# Patient Record
Sex: Female | Born: 1935 | Race: White | Hispanic: No | Smoking: Never smoker
Health system: Southern US, Community
[De-identification: ages and names within clinical notes are randomized; demographics above are authoritative.]

## PROBLEM LIST (undated history)

## (undated) DIAGNOSIS — I519 Heart disease, unspecified: Secondary | ICD-10-CM

## (undated) DIAGNOSIS — N3090 Cystitis, unspecified without hematuria: Secondary | ICD-10-CM

## (undated) DIAGNOSIS — M1990 Unspecified osteoarthritis, unspecified site: Secondary | ICD-10-CM

## (undated) DIAGNOSIS — I10 Essential (primary) hypertension: Secondary | ICD-10-CM

## (undated) DIAGNOSIS — E119 Type 2 diabetes mellitus without complications: Secondary | ICD-10-CM

## (undated) HISTORY — DX: Unspecified osteoarthritis, unspecified site: M19.90

## (undated) HISTORY — PX: APPENDECTOMY: SHX54

## (undated) HISTORY — DX: Essential (primary) hypertension: I10

## (undated) HISTORY — DX: Type 2 diabetes mellitus without complications: E11.9

## (undated) HISTORY — DX: Cystitis, unspecified without hematuria: N30.90

## (undated) HISTORY — DX: Heart disease, unspecified: I51.9

## (undated) HISTORY — PX: OTHER SURGICAL HISTORY: SHX169

---

## 1961-06-20 HISTORY — PX: TOTAL ABDOMINAL HYSTERECTOMY: SHX209

## 1973-06-20 HISTORY — PX: LUMBAR DISC SURGERY: SHX700

## 2003-06-22 LAB — HM COLONOSCOPY

## 2010-06-21 LAB — HM MAMMOGRAPHY: HM MAMMO: NORMAL

## 2010-10-14 ENCOUNTER — Ambulatory Visit: Payer: Self-pay | Admitting: Internal Medicine

## 2012-02-13 ENCOUNTER — Ambulatory Visit: Payer: Self-pay | Admitting: Internal Medicine

## 2012-04-17 ENCOUNTER — Ambulatory Visit: Payer: Self-pay | Admitting: Unknown Physician Specialty

## 2012-05-15 ENCOUNTER — Encounter: Payer: Self-pay | Admitting: Neurology

## 2012-05-20 ENCOUNTER — Encounter: Payer: Self-pay | Admitting: Neurology

## 2013-08-01 ENCOUNTER — Ambulatory Visit: Payer: Self-pay | Admitting: Podiatry

## 2013-08-01 LAB — CREATININE, SERUM
Creatinine: 1.24 mg/dL (ref 0.60–1.30)
EGFR (African American): 46 — ABNORMAL LOW
EGFR (Non-African Amer.): 39 — ABNORMAL LOW

## 2013-08-18 HISTORY — PX: TOE AMPUTATION: SHX809

## 2013-08-29 ENCOUNTER — Inpatient Hospital Stay: Payer: Self-pay | Admitting: Internal Medicine

## 2013-08-29 LAB — CBC WITH DIFFERENTIAL/PLATELET
Basophil #: 0.1 10*3/uL (ref 0.0–0.1)
Basophil %: 1 %
EOS ABS: 0.4 10*3/uL (ref 0.0–0.7)
Eosinophil %: 3.4 %
HCT: 35.1 % (ref 35.0–47.0)
HGB: 11.7 g/dL — ABNORMAL LOW (ref 12.0–16.0)
Lymphocyte #: 0.7 10*3/uL — ABNORMAL LOW (ref 1.0–3.6)
Lymphocyte %: 7 %
MCH: 27.1 pg (ref 26.0–34.0)
MCHC: 33.5 g/dL (ref 32.0–36.0)
MCV: 81 fL (ref 80–100)
MONO ABS: 0.7 x10 3/mm (ref 0.2–0.9)
MONOS PCT: 7 %
NEUTROS ABS: 8.6 10*3/uL — AB (ref 1.4–6.5)
NEUTROS PCT: 81.6 %
Platelet: 443 10*3/uL — ABNORMAL HIGH (ref 150–440)
RBC: 4.33 10*6/uL (ref 3.80–5.20)
RDW: 15.4 % — AB (ref 11.5–14.5)
WBC: 10.5 10*3/uL (ref 3.6–11.0)

## 2013-08-29 LAB — BASIC METABOLIC PANEL
Anion Gap: 5 — ABNORMAL LOW (ref 7–16)
BUN: 30 mg/dL — ABNORMAL HIGH (ref 7–18)
CO2: 27 mmol/L (ref 21–32)
Calcium, Total: 10 mg/dL (ref 8.5–10.1)
Chloride: 102 mmol/L (ref 98–107)
Creatinine: 1.28 mg/dL (ref 0.60–1.30)
EGFR (African American): 44 — ABNORMAL LOW
EGFR (Non-African Amer.): 38 — ABNORMAL LOW
GLUCOSE: 99 mg/dL (ref 65–99)
Osmolality: 274 (ref 275–301)
Potassium: 3.9 mmol/L (ref 3.5–5.1)
Sodium: 134 mmol/L — ABNORMAL LOW (ref 136–145)

## 2013-08-30 LAB — BASIC METABOLIC PANEL
ANION GAP: 3 — AB (ref 7–16)
BUN: 24 mg/dL — ABNORMAL HIGH (ref 7–18)
Calcium, Total: 9.4 mg/dL (ref 8.5–10.1)
Chloride: 102 mmol/L (ref 98–107)
Co2: 31 mmol/L (ref 21–32)
Creatinine: 1.13 mg/dL (ref 0.60–1.30)
EGFR (Non-African Amer.): 44 — ABNORMAL LOW
GFR CALC AF AMER: 51 — AB
Glucose: 118 mg/dL — ABNORMAL HIGH (ref 65–99)
OSMOLALITY: 277 (ref 275–301)
Potassium: 3.7 mmol/L (ref 3.5–5.1)
Sodium: 136 mmol/L (ref 136–145)

## 2013-08-30 LAB — CBC WITH DIFFERENTIAL/PLATELET
Basophil #: 0.1 10*3/uL (ref 0.0–0.1)
Basophil %: 0.9 %
Eosinophil #: 0.7 10*3/uL (ref 0.0–0.7)
Eosinophil %: 9.2 %
HCT: 30.3 % — AB (ref 35.0–47.0)
HGB: 10.2 g/dL — ABNORMAL LOW (ref 12.0–16.0)
LYMPHS ABS: 0.4 10*3/uL — AB (ref 1.0–3.6)
LYMPHS PCT: 5.7 %
MCH: 27.3 pg (ref 26.0–34.0)
MCHC: 33.6 g/dL (ref 32.0–36.0)
MCV: 81 fL (ref 80–100)
Monocyte #: 0.5 x10 3/mm (ref 0.2–0.9)
Monocyte %: 6.8 %
NEUTROS ABS: 5.5 10*3/uL (ref 1.4–6.5)
NEUTROS PCT: 77.4 %
Platelet: 338 10*3/uL (ref 150–440)
RBC: 3.73 10*6/uL — AB (ref 3.80–5.20)
RDW: 15.4 % — ABNORMAL HIGH (ref 11.5–14.5)
WBC: 7.2 10*3/uL (ref 3.6–11.0)

## 2013-08-31 LAB — SEDIMENTATION RATE: ERYTHROCYTE SED RATE: 61 mm/h — AB (ref 0–30)

## 2013-09-01 LAB — CREATININE, SERUM
CREATININE: 0.97 mg/dL (ref 0.60–1.30)
EGFR (African American): >60
GFR CALC NON AF AMER: 53 — AB

## 2013-09-02 LAB — VANCOMYCIN, TROUGH: VANCOMYCIN, TROUGH: 9 ug/mL — AB (ref 10–20)

## 2013-09-03 LAB — CULTURE, BLOOD (SINGLE)

## 2013-09-04 LAB — WOUND CULTURE

## 2013-09-04 LAB — PATHOLOGY REPORT

## 2013-11-08 DIAGNOSIS — E782 Mixed hyperlipidemia: Secondary | ICD-10-CM | POA: Insufficient documentation

## 2014-07-17 DIAGNOSIS — I071 Rheumatic tricuspid insufficiency: Secondary | ICD-10-CM | POA: Insufficient documentation

## 2014-07-17 DIAGNOSIS — I119 Hypertensive heart disease without heart failure: Secondary | ICD-10-CM | POA: Insufficient documentation

## 2014-07-17 DIAGNOSIS — I340 Nonrheumatic mitral (valve) insufficiency: Secondary | ICD-10-CM | POA: Insufficient documentation

## 2014-07-28 LAB — BASIC METABOLIC PANEL
BUN: 17 mg/dL (ref 4–21)
CREATININE: 1 mg/dL (ref <=1.1)

## 2014-07-28 LAB — HEMOGLOBIN A1C: Hgb A1c MFr Bld: 5.6 % (ref 4.0–6.0)

## 2014-07-28 LAB — LIPID PANEL
CHOLESTEROL: 241 mg/dL — AB (ref 0–200)
HDL: 66 mg/dL (ref 35–70)
LDL Cholesterol: 158 mg/dL
Triglycerides: 85 mg/dL (ref 40–160)

## 2014-07-28 LAB — TSH: TSH: 2.1 u[IU]/mL (ref <=5.90)

## 2014-07-28 LAB — CBC AND DIFFERENTIAL: Hemoglobin: 15.9 g/dL (ref 12.0–16.0)

## 2014-10-11 NOTE — Consult Note (Signed)
PATIENT NAME:  Laura [name], Laura [name] MR#:  [id number] DATE OF BIRTH:  January 17, 1925  DATE OF CONSULTATION:  08/29/2013  CONSULTING PHYSICIAN:  Larkin Ina A. Vickki Muff, DPM  REASON FOR CONSULTATION:  Left great toe osteomyelitis.   HISTORY OF PRESENT ILLNESS: This is an 81 year old female who I have been following in the outpatient clinic for the past approximately 3 to 4 weeks with an ulcer on her left great toe. She developed a deep probing ulcer. An MRI was performed, which initially was negative for osteomyelitis, and we have been monitoring her with x-rays. She presented to the outpatient clinic this week with noted worsening redness, swelling, drainage from her left great toe. X-rays at that time revealed obvious erosive changes to the IP joint of the left great toe, and we have recommended admission for IV antibiotics, surgical debridement and continue to follow  monitoring at that point.   PAST MEDICAL HISTORY:  Diabetes, hypertension, spinal stenosis, left valvular heart disease.   MEDICATIONS: Norvasc, vitamin D, clobetasol, clonidine, hydrochlorothiazide, losartan, multivitamin, pantoprazole.   ALLERGIES: CIPRO AND PPD.   SOCIAL HISTORY: She lives at home by herself. Her son brought her in today. She denies smoking or alcohol.   REVIEW OF SYSTEMS: She is not having any fevers or chills. She has had some mild to minimal pain to this left foot. No shortness of breath or chest pain. She has noticed some swelling into her left leg. Further review of systems are as above.    PHYSICAL EXAMINATION: GENERAL: She is alert and oriented.  VASCULAR: She has strongly palpable dorsalis pedis and posterior tibial pulses to her left foot. Capillary fill time is brisk.  NEUROLOGIC: Gross sensation is intact to the left foot, but protective sensation is absent to this left foot.  DERMATOLOGIC: She has noted diffuse cellulitis from the left great toe diffusely to the midfoot region. There is no lymphangitic streaking. She  has a plantar left great toe ulceration that probes dorsally to a secondary ulceration that was an area that was noted to have an abscess to it. This also probes down to bone at this point.  MUSCULOSKELETAL: Diffuse edema to the left leg and foot. There is crepitance at the IPJ of the left great toe with instability.   X-rays from the outpatient clinic shows obvious erosive changes at the IPJ of the left great toe with dorsomedial dislocation of the distal phalanx to the IPJ at this time, consistent with osteomyelitis. The MTPJ looks to be intact at this point.   ASSESSMENT: Left great toe osteomyelitis.   PLAN: We have admitted her. They have started her on IV Zosyn for now. A wound culture was taken outpatient. We will try to further evaluate this, as we get information on it. We do need to go ahead and consider surgical intervention with amputation of the left great toe. I have discussed this with the patient in great detail and consent has been given. We will plan on performing this tomorrow.   I will consult infectious disease to assist with IV antibiotics. She will likely need a PICC line.   ____________________________ Pete Glatter. Vickki Muff, DPM jaf:dmm D: 08/29/2013 13:04:09 ET T: 08/29/2013 13:25:33 ET JOB#: [id number]  cc: Larkin Ina A. Vickki Muff, DPM, <Dictator> Dushaun Okey DPM ELECTRONICALLY SIGNED 08/30/2013 10:09

## 2014-10-11 NOTE — Op Note (Signed)
PATIENT NAME:  Laura [name], Laura [name] MR#:  [id number] DATE OF BIRTH:  [birth year]  DATE OF PROCEDURE:  08/30/2013  PREOPERATIVE DIAGNOSIS: Left great toe osteomyelitis.   POSTOPERATIVE DIAGNOSIS: Left great toe osteomyelitis.   PROCEDURE: Amputation left great toe metatarsophalangeal joint.   SURGEON: Tarence Searcy A. Vickki Muff, DPM.   ANESTHESIA: IV sedation with local.   HEMOSTASIS: None.   COMPLICATIONS: None.   SPECIMEN: Left great toe osteomyelitis and wound culture from deep wound.   ESTIMATED BLOOD LOSS: Less than 25 mL.  OPERATIVE INDICATIONS: This is an 81 year old female, who was recently admitted for osteomyelitis of her left great toe. She presents to the OR today for surgical amputation of the left great toe. All risks, benefits, alternatives, and complications associated with surgery were discussed with the patient and informed consent has been given.   OPERATIVE PROCEDURE: The patient was brought into the OR and placed on operating table in the supine position. IV sedation was administered by the anesthesia team. A local block was placed by myself with Marcaine and lidocaine. The left lower extremity was then prepped and draped in the usual sterile fashion. Attention was directed to the left great toe where a fishmouth type of incision was made at the base of the toe. Full thickness incision was made dorsal and plantar. The toe was then disarticulated at the MTPJ. A deep wound culture was taken from the deeper portion of the ulcerative site where the purulent drainage was noted. The remainder of the toe was sent for pathological examination. All bleeders were Bovie cauterized. The wound was flushed with copious amounts of irrigation. Layered closure was then performed with a 4-0 Vicryl for the deeper layer and a 3-0 nylon for skin. A well compressive sterile bulky dressing was placed on the left foot. Overall, the patient tolerated the procedure and anesthesia well and was transported from the OR to  the PACU with all vital signs stable and neurovascular status intact. I will see her in the outpatient clinic in 5 to 7 days. Upon discharge, we will keep her in house for IV antibiotics and possible long-term PICC line.   ____________________________ Pete Glatter. Vickki Muff, DPM jaf:aw D: 08/30/2013 13:04:21 ET T: 08/30/2013 13:14:44 ET JOB#: [id number]  cc: Larkin Ina A. Vickki Muff, DPM, <Dictator> Tenia Goh DPM ELECTRONICALLY SIGNED 09/26/2013 9:11

## 2014-10-11 NOTE — H&P (Signed)
PATIENT NAME:  Laura [name], Laura [name] MR#:  [id number] DATE OF BIRTH:  Nov 26, 1924  DATE OF ADMISSION:  08/29/2013  PRIMARY CARE PHYSICIAN: Dr. Halina Maidens  PRIMARY PODIATRIST: Dr. Samara Deist  CHIEF COMPLAINT: Abscess of the left great toe.   HISTORY OF PRESENT ILLNESS: This is an 81 year old female who has been seeing Dr. Vickki Muff for the past several weeks due to an abscess on her left great toe. She does not know when this started, but she noted at one point that she had a blister and it popped. About a week later she was able to see Dr. Vickki Muff. She has been on some antibiotics for this and they have been treating this as an outpatient. However, apparently this has worsened and Dr. Vickki Muff is concerned and so asked the hospitalist to admit the patient for possible debridement/possible amputation and to rule out osteomyelitis.   REVIEW OF SYSTEMS: CONSTITUTIONAL: No fevers, chills, weakness.  EYES: No cataracts or blurry vision.  ENT: No tinnitus, ear pain, difficulty swallowing. RESPIRATORY: No cough, wheezing, hemoptysis, COPD. CARDIOVASCULAR: No chest pain, orthopnea, PND, or dyspnea on exertion. GASTROINTESTINAL: No nausea, vomiting, diarrhea, abdominal pain, melena, or ulcers. GENITOURINARY: No dysuria or hematuria.  ENDOCRINE: No polyuria or polydipsia.  HEMATOLOGIC AND LYMPHATIC: No easy bruising, bleeding, or swollen glands.  SKIN: She has this cellulitis and abscess of her great toe. MUSCULOSKELETAL: She has spinal stenosis. NEUROLOGIC: No history of CVA, TIA. PSYCHIATRIC: No history of anxiety or depression.  PAST MEDICAL HISTORY:  1.  Diabetes. 2.  Hypertension.  3.  Spinal stenosis.  PAST SURGICAL HISTORY:  1.  Lumbar surgery. 2.  Disk surgery. 3.  Right mandibular gland surgery. 4.  Colon polyps removed. 5.  Right ovary and fallopian tube removed. 6.  Appendectomy.   FAMILY HISTORY: Positive for CVA.  SOCIAL HISTORY: No tobacco, alcohol, or drug use.   ALLERGIES:  CIPROFLOXACIN, PPD.  MEDICATIONS: 1.  Norvasc 5 mg daily.  2.  Vitamin D3 400 units daily.  3.  Clobetasol 0.05% b.i.d. p.r.n.  4.  Clonidine 0.2 b.i.d.  5.  HCTZ/triamterene 25/37.5 daily.  6.  Losartan 100 mg daily.  7.  Multivitamin 1 tablet daily.  8.  Pantoprazole 40 mg daily.  PHYSICAL EXAMINATION: VITAL SIGNS: Temperature 97.4, pulse 80, respirations 18, blood pressure 132/80, 97% on room air.  GENERAL: The patient is alert and oriented, not in acute distress.  HEENT: Head is atraumatic. Pupils are round. Sclerae anicteric. Mucous membranes are moist. Oropharynx is clear.  NECK: Supple without JVD, carotid bruit, enlarged thyroid. HEART: Regular rate and rhythm. No murmurs, gallops, or rubs. PMI is not displaced. LUNGS: Clear to auscultation without crackles, rales, rhonchi, or wheezing. Normal to percussion.  ABDOMEN: Bowel sounds positive. Nontender and nondistended. No hepatosplenomegaly.   EXTREMITIES: She has 3+ edema in the left lower extremity. No edema in the right leg. SKIN: She has on her right foot, her middle and last tone, has a small, little skin tear/redness.  LEFT FOOT: Her first big toe is covered, but she has cellulitis all around that left toe with surrounding edema.  NEUROLOGIC: Cranial nerves II through XII are grossly intact. No focal deficits.   LABORATORY DATA: Pending.  ASSESSMENT AND PLAN: An 81 year old female who noticed a blister about 8 weeks ago and since that time has progressed into an abscess of her left foot big toe. She is being admitted for treatment as well as possible surgical debridement. 1.  Cellulitis of the toe with an  abscess.  Dr. Vickki Muff will be consulted as the patient was sent from his office. I will order a MRI to evaluate for osteomyelitis. The patient is on vancomycin and Zosyn. Further recommendations as per Dr. Vickki Muff.  2.  Lower extremity edema of the left leg, likely secondary to the infection of her toe. However, we will need  to rule out a deep vein thrombosis as the patient has not been as mobile. Dopplers are ordered. 3.  Hypertension. Continue outpatient medications. 4.  Diabetes. The patient will be on sliding scale insulin, holding metformin.  The patient is FULL code status.   TIME SPENT: Approximately 50 minutes.    ____________________________ Donell Beers. Benjie Karvonen, MD spm:sb D: 08/29/2013 11:34:08 ET T: 08/29/2013 12:17:03 ET JOB#: [id number]  cc: Brielle Moro P. Benjie Karvonen, MD, <Dictator> Halina Maidens, MD Pete Glatter Vickki Muff, DPM Darnesha Diloreto P Adilen Pavelko MD ELECTRONICALLY SIGNED 08/29/2013 14:37

## 2014-10-11 NOTE — Op Note (Signed)
PATIENT NAME:  Laura [name], Laura [name] MR#:  [id number] DATE OF BIRTH:  [birth year]  DATE OF PROCEDURE:  08/29/2013  PREOPERATIVE DIAGNOSIS: Osteomyelitis of the foot.   POSTOPERATIVE DIAGNOSIS: Osteomyelitis of the foot.   PROCEDURES:  1. Ultrasound guidance for vascular access to right basilic vein.  2. Fluoroscopic guidance for placement of catheter.  3. Insertion of peripherally inserted central venous catheter, right arm.  SURGEON: Algernon Huxley, M.D.   ANESTHESIA: Local.   ESTIMATED BLOOD LOSS: Minimal.   INDICATION FOR PROCEDURE: An 81 year old female with osteomyelitis of her foot who will require extended IV antibiotics.   DESCRIPTION OF PROCEDURE: The patient's right arm was sterilely prepped and draped, and a sterile surgical field was created. The right basilic vein was accessed under direct ultrasound guidance without difficulty with a micropuncture needle and permanent image was recorded. 0.018 wire was then placed into the superior vena cava. Peel-away sheath was placed over the wire. A single lumen peripherally inserted central venous catheter was then placed over the wire and the wire and peel-away sheath were removed. The catheter tip was placed into the superior vena cava and was secured at the skin at 31 cm with a sterile dressing. The catheter withdrew blood well and flushed easily with heparinized saline. The patient tolerated procedure well.  ____________________________ Algernon Huxley, MD jsd:gb D: 09/02/2013 14:55:00 ET T: 09/03/2013 00:20:35 ET JOB#: [id number]  cc: Algernon Huxley, MD, <Dictator> Algernon Huxley MD ELECTRONICALLY SIGNED 09/23/2013 9:43

## 2014-10-11 NOTE — Consult Note (Signed)
PATIENT NAME:  Laura [name], UBER MR#:  [id number] DATE OF BIRTH:  1924-12-26  DATE OF CONSULTATION:  08/30/2013  REFERRING PHYSICIAN:  Dr. Vickki Muff.  CONSULTING PHYSICIAN:  Cheral Marker. Ola Spurr, MD  REASON FOR CONSULTATION: Osteomyelitis and cellulitis.   HISTORY OF PRESENT ILLNESS: This is a very pleasant 81 year old female with reasonably well controlled diabetes, who has been following with podiatry as an outpatient for left great toe abscess and ulcer. She has been started on antibiotics, which she thinks was Augmentin. However, the wound popped and started draining and has worsened and started to have some spreading redness up her dorsum of her foot and onto her calf. The patient was admitted for IV antibiotics and possible debridement and to rule out osteomyelitis. She is scheduled for the OR today for partial amputation.   PAST MEDICAL HISTORY: 1.  Diabetes.  2.  Hypertension.  3.  Spinal stenosis.   PAST SURGICAL HISTORY:  1.  Lumbar surgery, disk surgery, right mandibular gland surgery.  2.  Colon polyp surgery.  3.  Right ovarian and fallopian tube surgery.  4.  Appendectomy.   FAMILY HISTORY: Positive for CVA.   SOCIAL HISTORY: The patient does not smoke, drink or use drugs. She lives at home.   ALLERGIES:  CIPROFLOXACIN.   ANTIBIOTICS SINCE ADMISSION: Include vancomycin and Zosyn.    OTHER MEDICATIONS: Include Tylenol, amlodipine, vitamin D, clonidine, clobetasol cream, Colace, Lovenox, Maxzide, insulin, Zofran, losartan, pantoprazole, Norco, morphine.   REVIEW OF SYSTEMS:  Eleven systems reviewed and negative except as per HPI.   PHYSICAL EXAMINATION: VITAL SIGNS: T-max since admission 98.4, pulse 93, blood pressure 136/79, respirations 18, sat 93% on room air.  GENERAL: She is pleasant, interactive, in no acute distress.  HEENT: Pupils equal, round and reactive light and accommodation. Extraocular movements are intact. Sclerae anicteric.  Oropharynx is clear. HEART: Regular.   LUNGS: Clear to auscultation bilaterally.  ABDOMEN: Soft, nontender, nondistended.  EXTREMITIES: On her left great toe, she has a plantar ulcer. I am able to express a small amount of pus from it. It is quite tender. She has spreading redness and warmth up her dorsum of her foot and onto her calf. She does have 1+ edema.   DATA: White blood count on admission was 10.5, hemoglobin 11.7, platelets 443. Blood cultures x 2 from March 12th show no growth to date. Renal function shows a creatinine of 1.28 with an estimated GFR of 38. Lower extremity Doppler was negative for DVT.     IMPRESSION: An 81 year old with osteomyelitis of the first toe. She has a draining ulcer at the site. Her diabetes is reportedly well controlled. She does have good circulation at the site. She is for surgery today.   RECOMMENDATIONS: 1.  PICC line is in place.  2.  Continue vancomycin and Zosyn.  3.  Further antibiotic recs based on culture results. She will likely need IV antibiotics for 2 to 4 weeks. Following that, I will see her in clinic, then can consider switching her to oral antibiotics to complete the course.   Thank you for the consult. I will be glad to follow with you.   ____________________________ Cheral Marker. Ola Spurr, MD dpf:dmm D: 08/30/2013 19:51:08 ET T: 08/30/2013 21:54:07 ET JOB#: [id number]  cc: Cheral Marker. Ola Spurr, MD, <Dictator> DAVID Ola Spurr MD ELECTRONICALLY SIGNED 09/01/2013 21:58

## 2014-10-11 NOTE — Discharge Summary (Signed)
PATIENT NAME:  Laura [name], Laura [name] MR#:  [id number] DATE OF BIRTH:  [birth year]  DATE OF ADMISSION:  08/29/2013 DATE OF DISCHARGE:  09/02/2013  ADMITTING DIAGNOSIS: Abscess of the left great toe.  DISCHARGE DIAGNOSES:  1.  Left great toe abscess/osteomyelitis status post amputation of left great toe metatarsophalangeal joint.  2.  Diabetes.  3.  Hypertension.  4.  Spinal stenosis.  5.  Status post lumbar surgery.  6.  Status post disk surgery.  7.  Status post right mandibular gland surgery.  8.  Status post colon polyp removal.  9.  Status post right ovary and fallopian tube removal.  10.  Status post appendectomy.   CONSULTANTS: Dr. Vickki Muff and Dr. Ola Spurr.  PERTINENT LABS AND EVALUATIONS: Glucose 99, BUN 30, creatinine 1.28, sodium 134, potassium 3.9, chloride 102, CO2 27. Calcium 10. WBC 10.5, hemoglobin 11.7, platelet count 443,000.    Blood culture no growth. Wound cultures of the left leg shows no growth. Moderate white blood cells, few gram-positive cocci in pairs and clusters.   Ultrasound of the lower extremities showed no evidence of DVT.   HOSPITAL COURSE: Please refer to H and P done by the admitting physician. The patient is an 81 year old white female who has been seeing Dr. Vickki Muff for the past few weeks for abscess of the left great toe. The patient was referred for osteomyelitis, and the patient was admitted for debridement and possible amputation due to the patient's failure to improve. Due to these symptoms, she was admitted and was started on antibiotics. She was seen by Dr. Vickki Muff who performed the amputation and wound cultures were ordered. She was seen by infectious disease due to the severity of infection. The patient was arranged to have IV antibiotics with the [facility]. The patient at this time is doing much better and has been cleared by Dr. Vickki Muff as well as Dr. Ola Spurr for discharge with home IV antibiotics.   DISCHARGE MEDICATIONS: Metformin 500 daily, losartan  100 daily, clonidine 0.2 one 1 tab p.o. b.i.d., aspirin 81 one tab p.o. daily, Allegra 180 daily, Tylenol 1000 mg q. 6 p.r.n., clobetasol topical 0.05% apply topically to affected area b.i.d., amlodipine 5 daily, Centrum 1 tab p.o. daily, methocarbamol 500 mg 1 tab 4 times a day as needed, vitamin D3 1000 international units daily, (Dictation Anomaly)   topically 2% to affected area 3 times a day as needed for itching, Maxzide 25/37.5 one 1 tab p.o. daily, acetaminophen/hydrocodone 325/5 one tab p.o. q. 6 p.r.n. for pain, vancomycin 1 gram IV q. 24 for 2 weeks, amoxicillin clavulanate 875 mg 1 tab p.o. b.i.d. x2 weeks.   HOME HEALTH: Yes. Physical therapy and nurse referral with dry dressing to the left foot every 2 days.   DIET: Low sodium, low fat, low cholesterol, carbohydrate control.   ACTIVITY: As tolerated with left leg partial weight-bearing, use rolling walker.  DISCHARGE FOLLOWUP AND INSTRUCTIONS: With Dr. Ola Spurr this week. Follow up with Dr. Vickki Muff as scheduled. Follow with primary MD in 2 to 4 weeks. Weekly CBC, CMP and vanc, to Dr. Ola Spurr.  TIME SPENT ON DISCHARGE: 45 minutes.  ____________________________ Lafonda Mosses Posey Pronto, MD shp:sb D: 09/03/2013 09:58:03 ET T: 09/03/2013 12:16:01 ET JOB#: [id number]  cc: Liviana Mills H. Posey Pronto, MD, <Dictator> Alric Seton MD ELECTRONICALLY SIGNED 09/04/2013 8:27

## 2015-01-16 ENCOUNTER — Encounter: Payer: Self-pay | Admitting: Internal Medicine

## 2015-01-16 ENCOUNTER — Ambulatory Visit (INDEPENDENT_AMBULATORY_CARE_PROVIDER_SITE_OTHER): Payer: Medicare PPO | Admitting: Internal Medicine

## 2015-01-16 VITALS — BP 126/86 | HR 76 | Ht 63.5 in | Wt 159.0 lb

## 2015-01-16 DIAGNOSIS — R600 Localized edema: Secondary | ICD-10-CM | POA: Insufficient documentation

## 2015-01-16 DIAGNOSIS — R300 Dysuria: Secondary | ICD-10-CM

## 2015-01-16 DIAGNOSIS — S98112A Complete traumatic amputation of left great toe, initial encounter: Secondary | ICD-10-CM | POA: Insufficient documentation

## 2015-01-16 DIAGNOSIS — M542 Cervicalgia: Secondary | ICD-10-CM | POA: Insufficient documentation

## 2015-01-16 DIAGNOSIS — E785 Hyperlipidemia, unspecified: Secondary | ICD-10-CM | POA: Insufficient documentation

## 2015-01-16 DIAGNOSIS — G5600 Carpal tunnel syndrome, unspecified upper limb: Secondary | ICD-10-CM | POA: Insufficient documentation

## 2015-01-16 DIAGNOSIS — E1151 Type 2 diabetes mellitus with diabetic peripheral angiopathy without gangrene: Secondary | ICD-10-CM | POA: Insufficient documentation

## 2015-01-16 DIAGNOSIS — D472 Monoclonal gammopathy: Secondary | ICD-10-CM | POA: Insufficient documentation

## 2015-01-16 DIAGNOSIS — L309 Dermatitis, unspecified: Secondary | ICD-10-CM | POA: Insufficient documentation

## 2015-01-16 DIAGNOSIS — T7840XA Allergy, unspecified, initial encounter: Secondary | ICD-10-CM | POA: Insufficient documentation

## 2015-01-16 DIAGNOSIS — N3946 Mixed incontinence: Secondary | ICD-10-CM | POA: Insufficient documentation

## 2015-01-16 DIAGNOSIS — M5416 Radiculopathy, lumbar region: Secondary | ICD-10-CM | POA: Insufficient documentation

## 2015-01-16 DIAGNOSIS — L988 Other specified disorders of the skin and subcutaneous tissue: Secondary | ICD-10-CM | POA: Insufficient documentation

## 2015-01-16 DIAGNOSIS — I10 Essential (primary) hypertension: Secondary | ICD-10-CM | POA: Insufficient documentation

## 2015-01-16 LAB — POC URINALYSIS WITH MICROSCOPIC (NON AUTO)MANUAL RESULT
CRYSTALS: 2
Epithelial cells, urine per micros: 2
MUCUS UA: 0
RBC: 1 M/uL — AB (ref 4.04–5.48)

## 2015-01-16 MED ORDER — NITROFURANTOIN MONOHYD MACRO 100 MG PO CAPS: 100.0000 mg | ORAL_CAPSULE | Freq: Two times a day (BID) | ORAL | Status: DC

## 2015-01-16 NOTE — Progress Notes (Signed)
Date:  01/16/2015   Name:  Laura [name]   DOB:  06-03-1925   MRN:  [id number]   Chief Complaint: Urinary Tract Infection Urinary Tract Infection  This is a new problem. The current episode started in the past 7 days. The problem occurs every urination. The problem has been unchanged. The quality of the pain is described as burning. The patient is experiencing no pain. There has been no fever. She is not sexually active. There is no history of pyelonephritis. Associated symptoms include urgency. Pertinent negatives include no chills, discharge, flank pain, frequency, hematuria or vomiting. The treatment provided no relief. There is no history of catheterization, recurrent UTIs or a single kidney.     Review of Systems:  Review of Systems  Constitutional: Negative for chills.  Respiratory: Negative for chest tightness and shortness of breath.   Gastrointestinal: Negative for vomiting.  Genitourinary: Positive for urgency. Negative for dysuria, frequency, hematuria, flank pain and vaginal bleeding.       Urine odor     Patient Active Problem List   Diagnosis Date Noted  . Carpal tunnel syndrome 01/16/2015  . Cervical pain 01/16/2015  . DM (diabetes mellitus), type 2 with peripheral vascular complications 46/80/3212  . Dyslipidemia 01/16/2015  . Allergic state 01/16/2015  . Essential (primary) hypertension 01/16/2015  . Amputated great toe 01/16/2015  . Calcium blood increased 01/16/2015  . Local edema 01/16/2015  . Lumbar radiculopathy 01/16/2015  . Mixed incontinence 01/16/2015  . MGUS (monoclonal gammopathy of unknown significance) 01/16/2015  . Peripheral blood vessel disorder 01/16/2015  . Dermatitis 01/16/2015  . Hypertensive left ventricular hypertrophy 07/17/2014  . MI (mitral incompetence) 07/17/2014  . TI (tricuspid incompetence) 07/17/2014  . Combined fat and carbohydrate induced hyperlipemia 11/08/2013    Prior to Admission medications   Medication Sig Start Date End  Date Taking? Authorizing Provider  amLODipine (NORVASC) 2.5 MG tablet Take 1 tablet by mouth daily.   Yes Historical Provider, MD  aspirin 81 MG chewable tablet Chew 1 tablet by mouth daily.   Yes Historical Provider, MD  clobetasol ointment (TEMOVATE) 0.05 % CLOBETASOL PROPIONATE, 0.05% (External Ointment) - Historical Medication  appication two times daily (0.05 %) Active   Yes Historical Provider, MD  cloNIDine (CATAPRES) 0.2 MG tablet Take 1 tablet by mouth 2 (two) times daily. 07/28/14  Yes Historical Provider, MD  fexofenadine (ALLEGRA) 180 MG tablet Take 1 tablet by mouth daily as needed. 12/11/12  Yes Historical Provider, MD  glucose blood (ACCU-CHEK AVIVA PLUS) test strip ACCU-CHEK AVIVA PLUS (In Vitro Strip)  1 (one) Strip daily for 50 days  Quantity: 50;  Refills: 3   Ordered :12-May-2014  Halina Maidens M.D.;  Started 12-May-2014 Active Comments: dx: E11.9 05/12/14  Yes Historical Provider, MD  losartan (COZAAR) 100 MG tablet Take 1 tablet by mouth daily. 05/06/14  Yes Historical Provider, MD  metFORMIN (GLUCOPHAGE) 500 MG tablet Take 1 tablet by mouth daily. 12/06/14  Yes Historical Provider, MD  mometasone (NASONEX) 50 MCG/ACT nasal spray Place 2 sprays into the nose daily as needed. 05/12/14  Yes Historical Provider, MD  triamterene-hydrochlorothiazide (MAXZIDE-25) 37.5-25 MG per tablet Take 1 tablet by mouth daily. 07/28/14  Yes Historical Provider, MD  mupirocin ointment (BACTROBAN) 2 %  12/11/12   Historical Provider, MD    Allergies  Allergen Reactions  . Calcium Channel Blockers Shortness Of Breath  . Ace Inhibitors Cough  . Beta Adrenergic Blockers     Other reaction(s): Headache  . Statins  weakness  . Clindamycin/Lincomycin Rash    Past Surgical History  Procedure Laterality Date  . Total abdominal hysterectomy  1963  . Appendectomy    . Lumbar disc surgery  1975  . Toe amputation Left 08/2013    History  Substance Use Topics  . Smoking status: Never  Smoker   . Smokeless tobacco: Not on file  . Alcohol Use: No     Medication list has been reviewed and updated.  Physical Examination:  Physical Exam  Constitutional: She appears well-developed and well-nourished. No distress.  Neck: Neck supple. No thyromegaly present.  Cardiovascular: Normal rate, regular rhythm and normal heart sounds.   Pulmonary/Chest: Effort normal and breath sounds normal. She has no wheezes.  Abdominal: Soft. There is no tenderness. There is no guarding and no CVA tenderness.    BP 142/88 mmHg  Pulse 76  Ht 5' 3.5" (1.613 m)  Wt 159 lb (72.122 kg)  BMI 27.72 kg/m2  Assessment and Plan: 1. Dysuria Continue adequate fluids - POC urinalysis w microscopic (non auto) - nitrofurantoin, macrocrystal-monohydrate, (MACROBID) 100 MG capsule; Take 1 capsule (100 mg total) by mouth 2 (two) times daily.  Dispense: 14 capsule; Refill: [place], MD [facility]  01/16/2015

## 2015-05-11 ENCOUNTER — Other Ambulatory Visit: Payer: Self-pay | Admitting: Internal Medicine

## 2015-06-08 ENCOUNTER — Other Ambulatory Visit: Payer: Self-pay | Admitting: Internal Medicine

## 2015-07-02 ENCOUNTER — Telehealth: Payer: Self-pay

## 2015-07-02 NOTE — Telephone Encounter (Signed)
Patient called in and states that she would like to get a Zpak for her cold and mucus she has going on. She states that she did not want to come.  I informed her that Dr. Army Melia does not Rx antibiotics over the phone and that she would need to see her for an office visit. She states that she does not feel well enough to come in. Advised that she would call back if she felt like coming in.

## 2015-08-06 ENCOUNTER — Encounter: Payer: Self-pay | Admitting: Internal Medicine

## 2015-08-06 ENCOUNTER — Ambulatory Visit (INDEPENDENT_AMBULATORY_CARE_PROVIDER_SITE_OTHER): Payer: Medicare PPO | Admitting: Internal Medicine

## 2015-08-06 VITALS — BP 146/88 | HR 78 | Ht 63.5 in | Wt 162.0 lb

## 2015-08-06 DIAGNOSIS — Z89412 Acquired absence of left great toe: Secondary | ICD-10-CM

## 2015-08-06 DIAGNOSIS — E782 Mixed hyperlipidemia: Secondary | ICD-10-CM | POA: Diagnosis not present

## 2015-08-06 DIAGNOSIS — S98112A Complete traumatic amputation of left great toe, initial encounter: Secondary | ICD-10-CM

## 2015-08-06 DIAGNOSIS — Z1231 Encounter for screening mammogram for malignant neoplasm of breast: Secondary | ICD-10-CM | POA: Diagnosis not present

## 2015-08-06 DIAGNOSIS — I119 Hypertensive heart disease without heart failure: Secondary | ICD-10-CM

## 2015-08-06 DIAGNOSIS — I10 Essential (primary) hypertension: Secondary | ICD-10-CM

## 2015-08-06 DIAGNOSIS — N3000 Acute cystitis without hematuria: Secondary | ICD-10-CM

## 2015-08-06 DIAGNOSIS — E1151 Type 2 diabetes mellitus with diabetic peripheral angiopathy without gangrene: Secondary | ICD-10-CM

## 2015-08-06 LAB — POC URINALYSIS WITH MICROSCOPIC (NON AUTO)MANUAL RESULT
Bilirubin, UA: NEGATIVE
CRYSTALS: 0
Epithelial cells, urine per micros: 3
Glucose, UA: NEGATIVE
Ketones, UA: NEGATIVE
MUCUS UA: 0
Nitrite, UA: POSITIVE
PH UA: 7.5
PROTEIN UA: 30
RBC: 0 M/uL — AB (ref 4.04–5.48)
SPEC GRAV UA: 1.01
Urobilinogen, UA: 0.2

## 2015-08-06 MED ORDER — TRIAMTERENE-HCTZ 37.5-25 MG PO TABS: 1.0000 | ORAL_TABLET | Freq: Every day | ORAL | Status: DC

## 2015-08-06 MED ORDER — METFORMIN HCL 500 MG PO TABS: 500.0000 mg | ORAL_TABLET | Freq: Every day | ORAL | Status: DC

## 2015-08-06 MED ORDER — CLONIDINE HCL 0.2 MG PO TABS: 0.2000 mg | ORAL_TABLET | Freq: Two times a day (BID) | ORAL | Status: DC

## 2015-08-06 MED ORDER — CIPROFLOXACIN HCL 250 MG PO TABS: 250.0000 mg | ORAL_TABLET | Freq: Two times a day (BID) | ORAL | Status: DC

## 2015-08-06 MED ORDER — GLUCOSE BLOOD VI STRP: 1.0000 | ORAL_STRIP | Freq: Every day | Status: DC

## 2015-08-06 NOTE — Progress Notes (Signed)
Patient: Laura [name], Female    DOB: [birth year], 82 y.o.   MRN: [id number] Visit Date: 08/06/2015  Today's Provider: Halina Maidens, MD   Chief Complaint  Patient presents with  . Medicare Wellness   Subjective:    Annual wellness visit Laura [name] is a 82 y.o. female who presents today for her Subsequent Annual Wellness Visit. She feels fairly well. She reports exercising none. She reports she is sleeping fairly well. She performs her own breast exam and denies problems.  She would like to have a mammogram.  She is up to date on immunizations.  ----------------------------------------------------------- Diabetes She presents for her follow-up diabetic visit. She has type 2 diabetes mellitus. Her disease course has been stable. There are no hypoglycemic associated symptoms. Pertinent negatives for hypoglycemia include no dizziness, headaches, nervousness/anxiousness or tremors. Pertinent negatives for diabetes include no fatigue. There are no hypoglycemic complications. Symptoms are stable. Diabetic complications include heart disease and peripheral neuropathy. She monitors urine at home 1-2 x per day. Her breakfast blood glucose is taken between 6-7 am. Her breakfast blood glucose range is generally 90-110 mg/dl. An ACE inhibitor/angiotensin II receptor blocker is being taken. She sees a podiatrist.Eye exam is current.  Hypertension This is a chronic problem. The problem is unchanged. The problem is controlled. Pertinent negatives include no headaches or shortness of breath. There are no compliance problems.   Urinary Tract Infection  This is a recurrent problem. The problem occurs every urination. The quality of the pain is described as burning. The patient is experiencing no pain. There has been no fever. She is not sexually active. There is no history of pyelonephritis. Associated symptoms include frequency. Pertinent negatives include no chills or hematuria. Associated symptoms comments:  odor.    Review of Systems  Constitutional: Negative for fever, chills and fatigue.  HENT: Positive for postnasal drip. Negative for ear pain, hearing loss, sinus pressure, tinnitus, trouble swallowing and voice change.   Eyes: Negative for visual disturbance.  Respiratory: Negative for cough, chest tightness, shortness of breath and wheezing.   Genitourinary: Positive for frequency. Negative for dysuria and hematuria.  Musculoskeletal: Positive for myalgias, back pain and gait problem.  Skin: Positive for wound (left lateral lower leg - since 2000). Negative for color change.  Neurological: Negative for dizziness, tremors and headaches.  Psychiatric/Behavioral: Negative for sleep disturbance and dysphoric mood. The patient is not nervous/anxious.     Social History   Social History  . Marital Status: Widowed    Spouse Name: N/A  . Number of Children: N/A  . Years of Education: N/A   Occupational History  . Not on file.   Social History Main Topics  . Smoking status: Never Smoker   . Smokeless tobacco: Not on file  . Alcohol Use: No  . Drug Use: No  . Sexual Activity: Not on file   Other Topics Concern  . Not on file   Social History Narrative    Patient Active Problem List   Diagnosis Date Noted  . Carpal tunnel syndrome 01/16/2015  . Cervical pain 01/16/2015  . DM (diabetes mellitus), type 2 with peripheral vascular complications ([place]) [id number]  . Dyslipidemia 01/16/2015  . Allergic state 01/16/2015  . Essential (primary) hypertension 01/16/2015  . Amputated great toe ([place]) 01/16/2015  . Calcium blood increased 01/16/2015  . Local edema 01/16/2015  . Lumbar radiculopathy 01/16/2015  . Mixed incontinence 01/16/2015  . MGUS (monoclonal gammopathy of unknown significance) 01/16/2015  . Peripheral blood vessel  disorder ([place]) 01/16/2015  . Dermatitis 01/16/2015  . Hypertensive left ventricular hypertrophy 07/17/2014  . MI (mitral incompetence) 07/17/2014  . TI  (tricuspid incompetence) 07/17/2014  . Combined fat and carbohydrate induced hyperlipemia 11/08/2013    Past Surgical History  Procedure Laterality Date  . Total abdominal hysterectomy  1963  . Appendectomy    . Lumbar disc surgery  1975  . Toe amputation Left 08/2013    Her family history includes Diabetes in her mother; Hypertension in her father; Stroke in her mother.    Previous Medications   AMLODIPINE (NORVASC) 2.5 MG TABLET    Take 1 tablet by mouth daily.   ASPIRIN 81 MG CHEWABLE TABLET    Chew 1 tablet by mouth daily.   CLOBETASOL OINTMENT (TEMOVATE) 0.05 %    CLOBETASOL PROPIONATE, 0.05% (External Ointment) - Historical Medication  appication two times daily (0.05 %) Active   CLONIDINE (CATAPRES) 0.2 MG TABLET    Take 1 tablet by mouth 2 (two) times daily.   FEXOFENADINE (ALLEGRA) 180 MG TABLET    Take 1 tablet by mouth daily as needed.   GLUCOSE BLOOD (ACCU-CHEK AVIVA PLUS) TEST STRIP    ACCU-CHEK AVIVA PLUS (In Vitro Strip)  1 (one) Strip daily for 50 days  Quantity: 50;  Refills: 3   Ordered :12-May-2014  Halina Maidens M.D.;  Started 12-May-2014 Active Comments: dx: E11.9   LOSARTAN (COZAAR) 100 MG TABLET    TAKE ONE TABLET BY MOUTH ONCE DAILY   METFORMIN (GLUCOPHAGE) 500 MG TABLET    TAKE ONE TABLET BY MOUTH ONCE DAILY   MOMETASONE (NASONEX) 50 MCG/ACT NASAL SPRAY    Place 2 sprays into the nose daily as needed.   TRIAMTERENE-HYDROCHLOROTHIAZIDE (MAXZIDE-25) 37.5-25 MG PER TABLET    Take 1 tablet by mouth daily.    Patient Care Team: Glean Hess, MD as PCP - General (Internal Medicine) Corey Skains, MD as Consulting Physician (Cardiology) Katha Cabal, MD (Vascular Surgery) Samara Deist, DPM as Referring Physician (Podiatry) Julieanne Manson Leeanne Mannan., MD (Rheumatology) Leanor Kail, MD (Unknown Physician Specialty)     Objective:   Vitals: BP 146/88 mmHg  Pulse 78  Ht 5' 3.5" (1.613 m)  Wt 162 lb (73.483 kg)  BMI 28.24 kg/m2  Physical  Exam  Constitutional: She is oriented to person, place, and time. She appears well-developed and well-nourished. No distress.  HENT:  Head: Normocephalic and atraumatic.  Right Ear: Tympanic membrane and ear canal normal.  Left Ear: Tympanic membrane and ear canal normal.  Nose: Right sinus exhibits no maxillary sinus tenderness. Left sinus exhibits no maxillary sinus tenderness.  Mouth/Throat: Uvula is midline and oropharynx is clear and moist.  Eyes: Conjunctivae and EOM are normal. Right eye exhibits no discharge. Left eye exhibits no discharge. No scleral icterus.  Neck: Normal range of motion. Carotid bruit is not present. No erythema present. No thyromegaly present.  Cardiovascular: Normal rate, regular rhythm and normal heart sounds.   Pulses:      Dorsalis pedis pulses are 1+ on the right side, and 1+ on the left side.       Posterior tibial pulses are 0 on the right side, and 0 on the left side.  Pulmonary/Chest: Effort normal and breath sounds normal. No respiratory distress. She has no wheezes. Right breast exhibits no mass, no nipple discharge, no skin change and no tenderness. Left breast exhibits no mass, no nipple discharge, no skin change and no tenderness.  Abdominal: Soft. Bowel sounds are  normal. There is no hepatosplenomegaly. There is no tenderness. There is no CVA tenderness.  Musculoskeletal: Normal range of motion. She exhibits edema and tenderness.       Feet:  Lymphadenopathy:    She has no cervical adenopathy.    She has no axillary adenopathy.  Neurological: She is alert and oriented to person, place, and time. She has normal reflexes. No cranial nerve deficit or sensory deficit.  Skin: Skin is warm, dry and intact. No rash noted.     Psychiatric: She has a normal mood and affect. Her speech is normal and behavior is normal. Thought content normal. Cognition and memory are normal.  Nursing note and vitals reviewed.   Activities of Daily Living In your  present state of health, do you have any difficulty performing the following activities: 01/16/2015  Hearing? Y  Vision? N  Difficulty concentrating or making decisions? N  Walking or climbing stairs? Y  Dressing or bathing? N  Doing errands, shopping? N    Fall Risk Assessment Fall Risk  01/16/2015  Falls in the past year? No      Depression Screen PHQ 2/9 Scores 01/16/2015  PHQ - 2 Score 0    Cognitive Testing - 6-CIT   Correct? Score   What year is it? yes 0 Yes = 0    No = 4  What month is it? yes 0 Yes = 0    No = 3  Remember:     [address], [address] [place], [place]     What time is it? yes 0 Yes = 0    No = 3  Count backwards from 20 to 1 yes 0 Correct = 0    1 error = 2   More than 1 error = 4  Say the months of the year in reverse. yes 0 Correct = 0    1 error = 2   More than 1 error = 4  What address did I ask you to remember? yes 2 Correct = 0  1 error = 2    2 error = 4    3 error = 6    4 error = 8    All wrong = 10       TOTAL SCORE  2/28   Interpretation:  Normal  Normal (0-7) Abnormal (8-28)      Medicare Annual Wellness Visit Summary:  Reviewed patient's Family Medical History Reviewed and updated list of patient's medical providers Assessment of cognitive impairment was done Assessed patient's functional ability Established a written schedule for health screening [place] Completed and Reviewed  Exercise Activities and Dietary recommendations Goals    None      Immunization History  Administered Date(s) Administered  . Influenza-Unspecified 05/01/2015  . Pneumococcal Conjugate-13 07/28/2014  . Pneumococcal Polysaccharide-23 06/22/2003  . Tdap 02/13/2012    Health Maintenance  Topic Date Due  . ZOSTAVAX  10/15/1986  . DEXA SCAN  10/15/1991  . OPHTHALMOLOGY EXAM  07/22/2015  . INFLUENZA VACCINE  01/19/2016  . HEMOGLOBIN A1C  02/03/2016  . FOOT EXAM  08/05/2016  . TETANUS/TDAP  02/12/2022  . PNA vac Low Risk Adult   Completed     Discussed health benefits of physical activity, and encouraged her to engage in regular exercise appropriate for her age and condition.    ------------------------------------------------------------------------------------------------------------   Assessment & Plan:     1. Essential (primary) hypertension Fairly well controlled without side effects  2. DM (  diabetes mellitus), type 2 with peripheral vascular complications (HCC) Blood sugars are good Continue oral agents  3. Combined fat and carbohydrate induced hyperlipemia Unable to tolerated statins Continue low fat diet; will not be aggressive due to age  70. Amputated great toe, unspecified laterality ([place]) Uses a cane for balance; getting shoes with supportive inserts  5. Hypertensive left ventricular hypertrophy, without heart failure Stable, minimal symptoms Followed by Cardiology  6. Encounter for screening mammogram for breast cancer - MM DIGITAL SCREENING BILATERAL; Future  7. Acute cystitis without hematuria - ciprofloxacin (CIPRO) 250 MG tablet; Take 1 tablet (250 mg total) by mouth 2 (two) times daily.  Dispense: 6 tablet; Refill: 0 - POC urinalysis w microscopic (non auto)  Halina Maidens, MD [facility]  08/06/2015

## 2015-08-06 NOTE — Patient Instructions (Signed)
Health Maintenance  Topic Date Due  . ZOSTAVAX  10/15/1986  . DEXA SCAN  10/15/1991  . OPHTHALMOLOGY EXAM  07/22/2015  . INFLUENZA VACCINE  01/19/2016  . HEMOGLOBIN A1C  02/03/2016  . FOOT EXAM  08/05/2016  . TETANUS/TDAP  02/12/2022  . PNA vac Low Risk Adult  Completed

## 2015-08-07 ENCOUNTER — Encounter: Payer: Self-pay | Admitting: Internal Medicine

## 2015-08-07 ENCOUNTER — Telehealth: Payer: Self-pay

## 2015-08-07 ENCOUNTER — Other Ambulatory Visit: Payer: Self-pay | Admitting: Internal Medicine

## 2015-08-07 DIAGNOSIS — E1129 Type 2 diabetes mellitus with other diabetic kidney complication: Secondary | ICD-10-CM | POA: Insufficient documentation

## 2015-08-07 LAB — LIPID PANEL
CHOL/HDL RATIO: 4.3 ratio (ref 0.0–4.4)
Cholesterol, Total: 244 mg/dL — ABNORMAL HIGH (ref 100–199)
HDL: 57 mg/dL (ref >39)
LDL Calculated: 163 mg/dL — ABNORMAL HIGH (ref 0–99)
Triglycerides: 119 mg/dL (ref 0–149)
VLDL CHOLESTEROL CAL: 24 mg/dL (ref 5–40)

## 2015-08-07 LAB — COMPREHENSIVE METABOLIC PANEL
ALBUMIN: 4.3 g/dL (ref 3.5–4.7)
ALK PHOS: 74 IU/L (ref 39–117)
ALT: 13 IU/L (ref 0–32)
AST: 23 IU/L (ref 0–40)
Albumin/Globulin Ratio: 1.7 (ref 1.1–2.5)
BUN / CREAT RATIO: 20 (ref 11–26)
BUN: 22 mg/dL (ref 8–27)
Bilirubin Total: 0.5 mg/dL (ref 0.0–1.2)
CALCIUM: 10.3 mg/dL (ref 8.7–10.3)
CO2: 26 mmol/L (ref 18–29)
Chloride: 99 mmol/L (ref 96–106)
Creatinine, Ser: 1.08 mg/dL — ABNORMAL HIGH (ref 0.57–1.00)
GFR calc Af Amer: 53 mL/min/{1.73_m2} — ABNORMAL LOW (ref >59)
GFR calc non Af Amer: 46 mL/min/{1.73_m2} — ABNORMAL LOW (ref >59)
GLOBULIN, TOTAL: 2.5 g/dL (ref 1.5–4.5)
GLUCOSE: 104 mg/dL — AB (ref 65–99)
Potassium: 4.5 mmol/L (ref 3.5–5.2)
Sodium: 142 mmol/L (ref 134–144)
Total Protein: 6.8 g/dL (ref 6.0–8.5)

## 2015-08-07 LAB — CBC WITH DIFFERENTIAL/PLATELET
BASOS ABS: 0 10*3/uL (ref 0.0–0.2)
Basos: 0 %
EOS (ABSOLUTE): 0.3 10*3/uL (ref 0.0–0.4)
EOS: 4 %
HEMATOCRIT: 46.6 % (ref 34.0–46.6)
HEMOGLOBIN: 15.7 g/dL (ref 11.1–15.9)
IMMATURE GRANS (ABS): 0 10*3/uL (ref 0.0–0.1)
IMMATURE GRANULOCYTES: 0 %
LYMPHS: 13 %
Lymphocytes Absolute: 1 10*3/uL (ref 0.7–3.1)
MCH: 28.8 pg (ref 26.6–33.0)
MCHC: 33.7 g/dL (ref 31.5–35.7)
MCV: 86 fL (ref 79–97)
MONOCYTES: 7 %
Monocytes Absolute: 0.5 10*3/uL (ref 0.1–0.9)
NEUTROS PCT: 76 %
Neutrophils Absolute: 5.6 10*3/uL (ref 1.4–7.0)
Platelets: 252 10*3/uL (ref 150–379)
RBC: 5.45 x10E6/uL — AB (ref 3.77–5.28)
RDW: 14.6 % (ref 12.3–15.4)
WBC: 7.5 10*3/uL (ref 3.4–10.8)

## 2015-08-07 LAB — TSH: TSH: 3.21 u[IU]/mL (ref 0.450–4.500)

## 2015-08-07 LAB — MICROALBUMIN / CREATININE URINE RATIO
Creatinine, Urine: 91.8 mg/dL
MICROALB/CREAT RATIO: 65.6 mg/g{creat} — AB (ref 0.0–30.0)
Microalbumin, Urine: 60.2 ug/mL

## 2015-08-07 LAB — HEMOGLOBIN A1C
ESTIMATED AVERAGE GLUCOSE: 117 mg/dL
Hgb A1c MFr Bld: 5.7 % — ABNORMAL HIGH (ref 4.8–5.6)

## 2015-08-07 MED ORDER — SULFAMETHOXAZOLE-TRIMETHOPRIM 800-160 MG PO TABS: 1.0000 | ORAL_TABLET | Freq: Two times a day (BID) | ORAL | Status: DC

## 2015-08-07 NOTE — Telephone Encounter (Signed)
-----   Message from Glean Hess, MD sent at 08/07/2015  9:03 AM EST ----- DM is good.  Kidney function is slightly decreased - will check next visit.  Cholesterol is borderline elevated as usual. Continue same medication.

## 2015-08-07 NOTE — Telephone Encounter (Signed)
Spoke with patient. Patient advised of all results and verbalized understanding. Will call back with any future questions or concerns. MAH  

## 2015-09-16 ENCOUNTER — Ambulatory Visit (INDEPENDENT_AMBULATORY_CARE_PROVIDER_SITE_OTHER): Payer: Medicare PPO | Admitting: Internal Medicine

## 2015-09-16 ENCOUNTER — Encounter: Payer: Self-pay | Admitting: Internal Medicine

## 2015-09-16 VITALS — BP 138/72 | HR 84 | Ht 63.5 in | Wt 163.6 lb

## 2015-09-16 DIAGNOSIS — N3000 Acute cystitis without hematuria: Secondary | ICD-10-CM

## 2015-09-16 DIAGNOSIS — N183 Chronic kidney disease, stage 3 unspecified: Secondary | ICD-10-CM

## 2015-09-16 DIAGNOSIS — N189 Chronic kidney disease, unspecified: Secondary | ICD-10-CM | POA: Insufficient documentation

## 2015-09-16 LAB — POC URINALYSIS WITH MICROSCOPIC (NON AUTO)MANUAL RESULT
BILIRUBIN UA: NEGATIVE
CRYSTALS: 0
EPITHELIAL CELLS, URINE PER MICROSCOPY: 2
Glucose, UA: NEGATIVE
Ketones, UA: NEGATIVE
MUCUS UA: 0
Nitrite, UA: NEGATIVE
PH UA: 7.5
PROTEIN UA: NEGATIVE
RBC: 3 M/uL — AB (ref 4.04–5.48)
Spec Grav, UA: 1.02
UROBILINOGEN UA: 0.2

## 2015-09-16 NOTE — Progress Notes (Signed)
Date:  09/16/2015   Name:  Laura [name]   DOB:  11-Jan-1925   MRN:  [id number]   Chief Complaint: Urinary Tract Infection Urinary Tract Infection  This is a new problem. The current episode started in the past 7 days. The problem occurs every urination. The quality of the pain is described as burning. There has been no fever. Associated symptoms include frequency, hesitancy and urgency. Pertinent negatives include no chills, discharge, flank pain or hematuria. She has tried antibiotics for the symptoms.  She was treated with Bactrim last month - she took the whole course and felt some improvement but also had severe constipation.  Now she feels the symptoms returning - burning, pressure and odor.  Review of Systems  Constitutional: Negative for chills.  Respiratory: Negative for chest tightness, shortness of breath and wheezing.   Cardiovascular: Negative for chest pain, palpitations and leg swelling.  Genitourinary: Positive for dysuria, hesitancy, urgency and frequency. Negative for hematuria and flank pain.    Patient Active Problem List   Diagnosis Date Noted  . DM (diabetes mellitus), type 2 with renal complications ([place]) 123XX123  . Carpal tunnel syndrome 01/16/2015  . Cervical pain 01/16/2015  . DM (diabetes mellitus), type 2 with peripheral vascular complications ([place]) [id number]  . Dyslipidemia 01/16/2015  . Allergic state 01/16/2015  . Essential (primary) hypertension 01/16/2015  . Amputated great toe ([place]) 01/16/2015  . Calcium blood increased 01/16/2015  . Local edema 01/16/2015  . Lumbar radiculopathy 01/16/2015  . Mixed incontinence 01/16/2015  . MGUS (monoclonal gammopathy of unknown significance) 01/16/2015  . Peripheral blood vessel disorder ([place]) 01/16/2015  . Dermatitis 01/16/2015  . Hypertensive left ventricular hypertrophy 07/17/2014  . MI (mitral incompetence) 07/17/2014  . TI (tricuspid incompetence) 07/17/2014  . Combined fat and carbohydrate induced  hyperlipemia 11/08/2013    Prior to Admission medications   Medication Sig Start Date End Date Taking? Authorizing Provider  amLODipine (NORVASC) 2.5 MG tablet Take 1 tablet by mouth daily.    Historical Provider, MD  aspirin 81 MG chewable tablet Chew 1 tablet by mouth daily.    Historical Provider, MD  clobetasol ointment (TEMOVATE) 0.05 % CLOBETASOL PROPIONATE, 0.05% (External Ointment) - Historical Medication  appication two times daily (0.05 %) Active    Historical Provider, MD  cloNIDine (CATAPRES) 0.2 MG tablet Take 1 tablet (0.2 mg total) by mouth 2 (two) times daily. 08/06/15   Glean Hess, MD  fexofenadine (ALLEGRA) 180 MG tablet Take 1 tablet by mouth daily as needed. 12/11/12   Historical Provider, MD  glucose blood (ACCU-CHEK AVIVA PLUS) test strip 1 each by Other route daily. Dx: E11.51 08/06/15   Glean Hess, MD  losartan (COZAAR) 100 MG tablet TAKE ONE TABLET BY MOUTH ONCE DAILY 05/11/15   Glean Hess, MD  metFORMIN (GLUCOPHAGE) 500 MG tablet Take 1 tablet (500 mg total) by mouth daily. 08/06/15   Glean Hess, MD  mometasone (NASONEX) 50 MCG/ACT nasal spray Place 2 sprays into the nose daily as needed. 05/12/14   Historical Provider, MD  sulfamethoxazole-trimethoprim (BACTRIM DS,SEPTRA DS) 800-160 MG tablet Take 1 tablet by mouth 2 (two) times daily. 08/07/15   Glean Hess, MD  triamterene-hydrochlorothiazide (MAXZIDE-25) 37.5-25 MG tablet Take 1 tablet by mouth daily. 08/06/15   Glean Hess, MD    Allergies  Allergen Reactions  . Calcium Channel Blockers Shortness Of Breath  . Nitrofurantoin Shortness Of Breath  . Ace Inhibitors Cough  . Beta Adrenergic Blockers  Other reaction(s): Headache  . Ciprofloxacin   . Statins     weakness  . Sulfa Antibiotics     Constipation and Rash  . Clindamycin/Lincomycin Rash    Past Surgical History  Procedure Laterality Date  . Total abdominal hysterectomy  1963  . Appendectomy    . Lumbar disc  surgery  1975  . Toe amputation Left 08/2013    Social History  Substance Use Topics  . Smoking status: Never Smoker   . Smokeless tobacco: None  . Alcohol Use: No    Medication list has been reviewed and updated.  Physical Exam  Constitutional: She appears well-developed and well-nourished.  Neck: Normal range of motion. Neck supple.  Cardiovascular: Normal rate, regular rhythm and normal heart sounds.   Pulmonary/Chest: Effort normal and breath sounds normal. No respiratory distress.  Abdominal: Soft. Bowel sounds are normal. There is tenderness in the suprapubic area. There is no rebound, no guarding and no CVA tenderness.  Musculoskeletal: She exhibits no edema.  Psychiatric: She has a normal mood and affect.  Nursing note and vitals reviewed.   BP 138/72 mmHg  Pulse 84  Ht 5' 3.5" (1.613 m)  Wt 163 lb 9.6 oz (74.208 kg)  BMI 28.52 kg/m2  Assessment and Plan: 1. Acute cystitis without hematuria Intolerant to multiple antibiotics - will culture before treating Consider Urology evaluation if recurrent - POC urinalysis w microscopic (non auto) - Urine Culture   Halina Maidens, MD [facility]  09/16/2015

## 2015-09-18 ENCOUNTER — Telehealth: Payer: Self-pay

## 2015-09-18 ENCOUNTER — Other Ambulatory Visit: Payer: Self-pay | Admitting: Internal Medicine

## 2015-09-18 LAB — URINE CULTURE

## 2015-09-18 MED ORDER — AMOXICILLIN-POT CLAVULANATE 875-125 MG PO TABS: 1.0000 | ORAL_TABLET | Freq: Two times a day (BID) | ORAL | Status: DC

## 2015-09-18 NOTE — Telephone Encounter (Signed)
-----   Message from Glean Hess, MD sent at 09/18/2015 12:12 PM EDT ----- Culture came back - needs to be treated with Augmentin.  I will send Rx to pharmacy.

## 2015-09-18 NOTE — Telephone Encounter (Signed)
Spoke with patient. Patient advised of all results and verbalized understanding. Will call back with any future questions or concerns. MAH  

## 2015-09-21 ENCOUNTER — Encounter: Payer: Self-pay | Admitting: Internal Medicine

## 2015-09-21 ENCOUNTER — Ambulatory Visit (INDEPENDENT_AMBULATORY_CARE_PROVIDER_SITE_OTHER): Payer: Medicare PPO | Admitting: Internal Medicine

## 2015-09-21 VITALS — BP 122/76 | HR 82 | Ht 63.5 in | Wt 163.0 lb

## 2015-09-21 DIAGNOSIS — L309 Dermatitis, unspecified: Secondary | ICD-10-CM

## 2015-09-21 DIAGNOSIS — N3000 Acute cystitis without hematuria: Secondary | ICD-10-CM | POA: Diagnosis not present

## 2015-09-21 DIAGNOSIS — I776 Arteritis, unspecified: Secondary | ICD-10-CM | POA: Diagnosis not present

## 2015-09-21 NOTE — Progress Notes (Signed)
Date:  09/21/2015   Name:  Laura [name]   DOB:  08-Aug-1924   MRN:  [id number]   Chief Complaint: Allergic Reaction Patient was seen last week for urinary tract infection. Due to multiple allergies and unresponsiveness to previous antibiotics a culture was sent. That grew out 50,000 units of Proteus mirabilis sensitive to Augmentin. She started Augmentin 3 days ago and has taken a total of 4 doses. Last evening she noticed that her legs were slightly swollen and very red. She has not taken any more Augmentin and today states that the redness is essentially unchanged. Of note, in 2015 she was hospitalized with superficial phlebitis in her leg and treated with Augmentin orally. She states she had a similar red rash that took a very long time to resolve. At that time she was told it was a reaction to the adhesive dressings they were using on her legs. Currently she denies any shortness of breath and swelling of her lips or tongue or rash in other locations.   Review of Systems  Constitutional: Negative for fever and fatigue.  HENT: Negative for sore throat and trouble swallowing.   Respiratory: Negative for chest tightness, shortness of breath and wheezing.   Cardiovascular: Negative for chest pain.  Gastrointestinal: Negative for abdominal pain.  Genitourinary: Negative for dysuria and hematuria.  Skin: Positive for rash.    Patient Active Problem List   Diagnosis Date Noted  . Chronic renal insufficiency 09/16/2015  . DM (diabetes mellitus), type 2 with renal complications ([place]) 123XX123  . Carpal tunnel syndrome 01/16/2015  . Cervical pain 01/16/2015  . DM (diabetes mellitus), type 2 with peripheral vascular complications ([place]) [id number]  . Dyslipidemia 01/16/2015  . Allergic state 01/16/2015  . Essential (primary) hypertension 01/16/2015  . Amputated great toe ([place]) 01/16/2015  . Calcium blood increased 01/16/2015  . Local edema 01/16/2015  . Lumbar radiculopathy 01/16/2015  .  Mixed incontinence 01/16/2015  . MGUS (monoclonal gammopathy of unknown significance) 01/16/2015  . Peripheral blood vessel disorder ([place]) 01/16/2015  . Dermatitis 01/16/2015  . Hypertensive left ventricular hypertrophy 07/17/2014  . MI (mitral incompetence) 07/17/2014  . TI (tricuspid incompetence) 07/17/2014    Prior to Admission medications   Medication Sig Start Date End Date Taking? Authorizing Provider  amLODipine (NORVASC) 2.5 MG tablet Take 1 tablet by mouth daily.    Historical Provider, MD  aspirin 81 MG chewable tablet Chew 1 tablet by mouth daily.    Historical Provider, MD  clobetasol ointment (TEMOVATE) 0.05 % CLOBETASOL PROPIONATE, 0.05% (External Ointment) - Historical Medication  appication two times daily (0.05 %) Active    Historical Provider, MD  cloNIDine (CATAPRES) 0.2 MG tablet Take 1 tablet (0.2 mg total) by mouth 2 (two) times daily. 08/06/15   Glean Hess, MD  fexofenadine (ALLEGRA) 180 MG tablet Take 1 tablet by mouth daily as needed. 12/11/12   Historical Provider, MD  glucose blood (ACCU-CHEK AVIVA PLUS) test strip 1 each by Other route daily. Dx: E11.51 08/06/15   Glean Hess, MD  losartan (COZAAR) 100 MG tablet TAKE ONE TABLET BY MOUTH ONCE DAILY 05/11/15   Glean Hess, MD  metFORMIN (GLUCOPHAGE) 500 MG tablet Take 1 tablet (500 mg total) by mouth daily. 08/06/15   Glean Hess, MD  mometasone (NASONEX) 50 MCG/ACT nasal spray Place 2 sprays into the nose daily as needed. 05/12/14   Historical Provider, MD  triamterene-hydrochlorothiazide (MAXZIDE-25) 37.5-25 MG tablet Take 1 tablet by mouth daily. 08/06/15  Glean Hess, MD    Allergies  Allergen Reactions  . Augmentin [Amoxicillin-Pot Clavulanate] Other (See Comments)    Vasculitis  . Calcium Channel Blockers Shortness Of Breath  . Nitrofurantoin Shortness Of Breath  . Ace Inhibitors Cough  . Beta Adrenergic Blockers     Other reaction(s): Headache  . Ciprofloxacin   . Statins      weakness  . Sulfa Antibiotics     Constipation and Rash  . Clindamycin/Lincomycin Rash    Past Surgical History  Procedure Laterality Date  . Total abdominal hysterectomy  1963  . Appendectomy    . Lumbar disc surgery  1975  . Toe amputation Left 08/2013    Social History  Substance Use Topics  . Smoking status: Never Smoker   . Smokeless tobacco: None  . Alcohol Use: No     Medication list has been reviewed and updated.   Physical Exam  Constitutional: She is oriented to person, place, and time. She appears well-developed.  Cardiovascular: Normal rate, regular rhythm and normal heart sounds.   Pulmonary/Chest: Effort normal and breath sounds normal.  Neurological: She is alert and oriented to person, place, and time.  Skin:     Psychiatric: She has a normal mood and affect. Her behavior is normal.    BP 122/76 mmHg  Pulse 82  Ht 5' 3.5" (1.613 m)  Wt 163 lb (73.936 kg)  BMI 28.42 kg/m2  Assessment and Plan: 1. Vasculitis ([place]) Will monitor - patient to call tomorrow to report; if worsening, will give prednisone taper  2. Dermatitis  3. Acute cystitis without hematuria Culture shows Proteus Mirabilis 50,000 units.  Since symptoms are improved, will hold on further antibiotics for now Consider re-culture in 1-2 weeks; if persistently positive and symptomatic, consider Urology referral   Halina Maidens, MD [facility]  09/21/2015

## 2015-09-22 ENCOUNTER — Telehealth: Payer: Self-pay

## 2015-09-22 NOTE — Telephone Encounter (Signed)
She called in regards to her visit yesterday. She states the redness isn't much lighter in color, but seems to be subsiding some. She can see some "white patches" showing her natural skin color. The swelling is maybe a little better.

## 2015-10-07 ENCOUNTER — Encounter: Payer: Self-pay | Admitting: Internal Medicine

## 2015-10-07 ENCOUNTER — Ambulatory Visit (INDEPENDENT_AMBULATORY_CARE_PROVIDER_SITE_OTHER): Payer: Medicare PPO | Admitting: Internal Medicine

## 2015-10-07 VITALS — BP 128/80 | HR 84 | Ht 63.5 in | Wt 162.8 lb

## 2015-10-07 DIAGNOSIS — N3000 Acute cystitis without hematuria: Secondary | ICD-10-CM | POA: Diagnosis not present

## 2015-10-07 LAB — POC URINALYSIS WITH MICROSCOPIC (NON AUTO)MANUAL RESULT
BILIRUBIN UA: NEGATIVE
CRYSTALS: 0
EPITHELIAL CELLS, URINE PER MICROSCOPY: 2
GLUCOSE UA: NEGATIVE
Ketones, UA: NEGATIVE
Mucus, UA: 0
Protein, UA: NEGATIVE
RBC UA: NEGATIVE
RBC: 2 M/uL — AB (ref 4.04–5.48)
Spec Grav, UA: 1.015
UROBILINOGEN UA: 0.2
WBC Casts, UA: 10
pH, UA: 6.5

## 2015-10-07 NOTE — Addendum Note (Signed)
Addended by: Theresia Majors A on: 10/07/2015 04:41 PM   Modules accepted: Miquel Dunn

## 2015-10-07 NOTE — Progress Notes (Signed)
Date:  10/07/2015   Name:  Laura [name]   DOB:  [birth year]   MRN:  [id number]   Chief Complaint: Urinary Tract Infection patient is here to follow up UTI.  Seen about 2 weeks ago - culture of urine positive for Proteus Mirabilis sens to Augmentin.  After 4 days of antibiotics, she developed a vasculitis rash on both lower legs. This was treated conservatively and has now resolved. Antibiotics were stopped and she is here to recheck her urine. She still has intermittent burning and discomfort with urination.  She feels that she empties completely most of the time.  Drug reaction/vasculitis -  Of both lower extremities Augmentin (probably the clavulanic acid).   Much better at this time.  Right lower leg resolved.  Left lower leg still has small area of redness and mild serous drainage that patient has had off and on for years.    Review of Systems  Constitutional: Negative for fever, chills and fatigue.  Respiratory: Negative for chest tightness, shortness of breath and wheezing.   Cardiovascular: Positive for leg swelling. Negative for chest pain and palpitations.  Genitourinary: Positive for dysuria and frequency. Negative for urgency, hematuria and difficulty urinating.  Musculoskeletal: Negative for arthralgias.  Skin: Positive for color change and rash.  Psychiatric/Behavioral: Negative for sleep disturbance. The patient is not nervous/anxious.     Patient Active Problem List   Diagnosis Date Noted  . Vasculitis ([place]) 09/21/2015  . Chronic renal insufficiency 09/16/2015  . DM (diabetes mellitus), type 2 with renal complications ([place]) [id number]  . Carpal tunnel syndrome 01/16/2015  . Cervical pain 01/16/2015  . DM (diabetes mellitus), type 2 with peripheral vascular complications ([place]) [id number]  . Dyslipidemia 01/16/2015  . Allergic state 01/16/2015  . Essential (primary) hypertension 01/16/2015  . Amputated great toe ([place]) 01/16/2015  . Calcium blood increased 01/16/2015  .  Local edema 01/16/2015  . Lumbar radiculopathy 01/16/2015  . Mixed incontinence 01/16/2015  . MGUS (monoclonal gammopathy of unknown significance) 01/16/2015  . Peripheral blood vessel disorder ([place]) 01/16/2015  . Dermatitis 01/16/2015  . Hypertensive left ventricular hypertrophy 07/17/2014  . MI (mitral incompetence) 07/17/2014  . TI (tricuspid incompetence) 07/17/2014    Prior to Admission medications   Medication Sig Start Date End Date Taking? Authorizing Provider  amLODipine (NORVASC) 2.5 MG tablet Take 1 tablet by mouth daily.    Historical Provider, MD  aspirin 81 MG chewable tablet Chew 1 tablet by mouth daily.    Historical Provider, MD  clobetasol ointment (TEMOVATE) 0.05 % CLOBETASOL PROPIONATE, 0.05% (External Ointment) - Historical Medication  appication two times daily (0.05 %) Active    Historical Provider, MD  cloNIDine (CATAPRES) 0.2 MG tablet Take 1 tablet (0.2 mg total) by mouth 2 (two) times daily. 08/06/15   Glean Hess, MD  fexofenadine (ALLEGRA) 180 MG tablet Take 1 tablet by mouth daily as needed. 12/11/12   Historical Provider, MD  glucose blood (ACCU-CHEK AVIVA PLUS) test strip 1 each by Other route daily. Dx: E11.51 08/06/15   Glean Hess, MD  losartan (COZAAR) 100 MG tablet TAKE ONE TABLET BY MOUTH ONCE DAILY 05/11/15   Glean Hess, MD  metFORMIN (GLUCOPHAGE) 500 MG tablet Take 1 tablet (500 mg total) by mouth daily. 08/06/15   Glean Hess, MD  mometasone (NASONEX) 50 MCG/ACT nasal spray Place 2 sprays into the nose daily as needed. 05/12/14   Historical Provider, MD  triamterene-hydrochlorothiazide (MAXZIDE-25) 37.5-25 MG tablet Take 1 tablet by  mouth daily. 08/06/15   Glean Hess, MD    Allergies  Allergen Reactions  . Augmentin [Amoxicillin-Pot Clavulanate] Other (See Comments)    Vasculitis  . Calcium Channel Blockers Shortness Of Breath  . Nitrofurantoin Shortness Of Breath  . Ace Inhibitors Cough  . Beta Adrenergic Blockers      Other reaction(s): Headache  . Ciprofloxacin   . Statins     weakness  . Sulfa Antibiotics     Constipation and Rash  . Clindamycin/Lincomycin Rash    Past Surgical History  Procedure Laterality Date  . Total abdominal hysterectomy  1963  . Appendectomy    . Lumbar disc surgery  1975  . Toe amputation Left 08/2013    Social History  Substance Use Topics  . Smoking status: Never Smoker   . Smokeless tobacco: None  . Alcohol Use: No    Medication list has been reviewed and updated.   Physical Exam  Constitutional: She is oriented to person, place, and time. She appears well-developed and well-nourished.  Neck: Normal range of motion. Neck supple.  Cardiovascular: Normal rate, regular rhythm and normal heart sounds.   Pulmonary/Chest: Effort normal and breath sounds normal.  Abdominal: Soft. There is no tenderness.  Neurological: She is alert and oriented to person, place, and time.  Skin: There is erythema.     Nursing note and vitals reviewed.   BP 128/80 mmHg  Pulse 84  Ht 5' 3.5" (1.613 m)  Wt 162 lb 12.8 oz (73.846 kg)  BMI 28.38 kg/m2  Assessment and Plan: 1. Acute cystitis without hematuria Will send for culture and refer to Urology for further evaluation - POC urinalysis w microscopic (non auto) - Urine Culture - Ambulatory referral to Urology   Halina Maidens, MD [facility]  10/07/2015

## 2015-10-09 LAB — URINE CULTURE

## 2015-10-20 ENCOUNTER — Ambulatory Visit (INDEPENDENT_AMBULATORY_CARE_PROVIDER_SITE_OTHER): Payer: Medicare PPO | Admitting: Urology

## 2015-10-20 ENCOUNTER — Encounter: Payer: Self-pay | Admitting: Urology

## 2015-10-20 VITALS — BP 162/99 | HR 97 | Ht 62.0 in | Wt 163.1 lb

## 2015-10-20 DIAGNOSIS — N3000 Acute cystitis without hematuria: Secondary | ICD-10-CM

## 2015-10-20 DIAGNOSIS — N3946 Mixed incontinence: Secondary | ICD-10-CM | POA: Diagnosis not present

## 2015-10-20 DIAGNOSIS — N952 Postmenopausal atrophic vaginitis: Secondary | ICD-10-CM | POA: Diagnosis not present

## 2015-10-20 LAB — URINALYSIS, COMPLETE
Bilirubin, UA: NEGATIVE
GLUCOSE, UA: NEGATIVE
KETONES UA: NEGATIVE
Nitrite, UA: NEGATIVE
PH UA: 5.5 (ref 5.0–7.5)
PROTEIN UA: NEGATIVE
RBC, UA: NEGATIVE
Specific Gravity, UA: 1.015 (ref 1.005–1.030)
UUROB: 0.2 mg/dL (ref 0.2–1.0)

## 2015-10-20 LAB — BLADDER SCAN AMB NON-IMAGING: Scan Result: 59

## 2015-10-20 LAB — MICROSCOPIC EXAMINATION: BACTERIA UA: NONE SEEN

## 2015-10-20 NOTE — Progress Notes (Signed)
10/20/2015 11:45 PM   Laura [name] [birth year] [id number]  Referring provider: Glean Hess, MD [address] [place], [place] [address]  Chief Complaint  Patient presents with  . Cystitis    referred by Dr. Berna Bue    HPI: Patient is an 82 year old Caucasian female with a history of recurrent urinary tract infections and multiple antibiotic allergies who presents today as a referral from her PCP, Dr. Army Melia, for further evaluation and management.  In February 2017, she had a wellness visit with Dr. Army Melia and was having symptoms of cystitis. She was placed on Cipro.  Her symptoms returned and urine culture results were positive for Proteus.  She was then initiated on Augmentin, but she developed vasculitis and the antibiotic was discontinued.  Her repeated urine culture was still positive for Proteus and she was referred to [place].  The patient states that her symptoms of urinary tract infection are malodorous urine, dysuria and stinging.  She states that she is having the symptoms now.  Her baseline urinary symptoms consist of urgency, dysuria, nocturia 1 and makes urinary incontinence.  Patient states she wears pads throughout the day and changes them frequently.  She is unsure as to how many pads are wet throughout the day.  UA today is positive for leukocytosis. Her PVR is 59 mL. She denies gross hematuria and suprapubic pain. She also denies any recent fevers, chills, nausea or back pain.  PMH: Past Medical History  Diagnosis Date  . Cystitis   . Arthritis   . Diabetes ([place])   . Heart disease   . HTN (hypertension)     Surgical History: Past Surgical History  Procedure Laterality Date  . Total abdominal hysterectomy  1963  . Appendectomy    . Lumbar disc surgery  1975  . Toe amputation Left 08/2013  . Bone marrow withdrawal    . Colon polyp removal      Home Medications:    Medication List       This list is accurate as of: 10/20/15 11:59  PM.  Always use your most recent med list.               acetaminophen 500 MG tablet  Commonly known as:  TYLENOL  Take 500 mg by mouth every 6 (six) hours as needed.     amLODipine 2.5 MG tablet  Commonly known as:  NORVASC  Take 1 tablet by mouth daily.     aspirin 81 MG chewable tablet  Chew 1 tablet by mouth daily.     cholecalciferol 1000 units tablet  Commonly known as:  VITAMIN D  Take 1,000 Units by mouth daily.     clobetasol ointment 0.05 %  Commonly known as:  TEMOVATE  CLOBETASOL PROPIONATE, 0.05% (External Ointment) - Historical Medication  appication two times daily (0.05 %) Active     cloNIDine 0.2 MG tablet  Commonly known as:  CATAPRES  Take 1 tablet (0.2 mg total) by mouth 2 (two) times daily.     fexofenadine 180 MG tablet  Commonly known as:  ALLEGRA  Take 1 tablet by mouth daily as needed.     glucosamine-chondroitin 500-400 MG tablet  Take 1 tablet by mouth 3 (three) times daily.     glucose blood test strip  Commonly known as:  ACCU-CHEK AVIVA PLUS  1 each by Other route daily. Dx: E11.51     ibuprofen 200 MG tablet  Commonly known as:  ADVIL,MOTRIN  Take  200 mg by mouth every 6 (six) hours as needed.     losartan 100 MG tablet  Commonly known as:  COZAAR  TAKE ONE TABLET BY MOUTH ONCE DAILY     metFORMIN 500 MG tablet  Commonly known as:  GLUCOPHAGE  Take 1 tablet (500 mg total) by mouth daily.     NASONEX 50 MCG/ACT nasal spray  Generic drug:  mometasone  Place 2 sprays into the nose daily as needed.     triamterene-hydrochlorothiazide 37.5-25 MG tablet  Commonly known as:  MAXZIDE-25  Take 1 tablet by mouth daily.        Allergies:  Allergies  Allergen Reactions  . Augmentin [Amoxicillin-Pot Clavulanate] Other (See Comments)    Vasculitis  . Calcium Channel Blockers Shortness Of Breath  . Nitrofurantoin Shortness Of Breath  . Ace Inhibitors Cough  . Beta Adrenergic Blockers     Other reaction(s): Headache  .  Ciprofloxacin     Pt is allergic to all FLOXINS-unsure of reaction  . Statins     weakness  . Sulfa Antibiotics     Constipation and Rash  . Clindamycin/Lincomycin Rash    Family History: Family History  Problem Relation Age of Onset  . Diabetes Mother   . Hypertension Father   . Stroke Mother   . Kidney disease Neg Hx   . Bladder Cancer Neg Hx     Social History:  reports that she has never smoked. She does not have any smokeless tobacco history on file. She reports that she does not drink alcohol or use illicit drugs.  ROS: UROLOGY Frequent Urination?: No Hard to postpone urination?: Yes Burning/pain with urination?: Yes Get up at night to urinate?: Yes Leakage of urine?: Yes Urine stream starts and stops?: No Trouble starting stream?: No Do you have to strain to urinate?: No Blood in urine?: No Urinary tract infection?: No Sexually transmitted disease?: No Injury to kidneys or bladder?: No Painful intercourse?: No Weak stream?: No Currently pregnant?: No Vaginal bleeding?: No Last menstrual period?: n  Gastrointestinal Nausea?: No Vomiting?: No Indigestion/heartburn?: No Diarrhea?: No Constipation?: No  Constitutional Fever: No Night sweats?: No Weight loss?: No Fatigue?: No  Skin Skin rash/lesions?: No Itching?: No  Eyes Blurred vision?: No Double vision?: No  Ears/Nose/Throat Sore throat?: No Sinus problems?: No  Hematologic/Lymphatic Swollen glands?: No Easy bruising?: No  Cardiovascular Leg swelling?: No Chest pain?: No  Respiratory Cough?: No Shortness of breath?: No  Endocrine Excessive thirst?: No  Musculoskeletal Back pain?: Yes Joint pain?: Yes  Neurological Headaches?: No Dizziness?: No  Psychologic Depression?: No Anxiety?: No  Physical Exam: BP 162/99 mmHg  Pulse 97  Ht 5\' 2"  (1.575 m)  Wt 163 lb 1.6 oz (73.982 kg)  BMI 29.82 kg/m2  Constitutional: Well nourished. Alert and oriented, No acute  distress. HEENT: [place] AT, moist mucus membranes. Trachea midline, no masses. Cardiovascular: No clubbing, cyanosis, or edema. Respiratory: Normal respiratory effort, no increased work of breathing. GI: Abdomen is soft, non tender, non distended, no abdominal masses. Liver and spleen not palpable.  No hernias appreciated.  Stool sample for occult testing is not indicated.   GU: No CVA tenderness.  No bladder fullness or masses.  Atrophic external genitalia, normal pubic hair distribution, no lesions.  Vaginal canal is narrowed and will not allow a bimanual exam.   Anus and perineum are without rashes or lesions.    Skin: No rashes, bruises or suspicious lesions. Lymph: No cervical or inguinal adenopathy. Neurologic: Grossly  intact, no focal deficits, moving all 4 extremities. Psychiatric: Normal mood and affect.  Laboratory Data: Lab Results  Component Value Date   WBC 7.5 08/06/2015   HGB 15.9 07/28/2014   HCT 46.6 08/06/2015   MCV 86 08/06/2015   PLT 252 08/06/2015    Lab Results  Component Value Date   CREATININE 1.08* 08/06/2015    Lab Results  Component Value Date   HGBA1C 5.7* 08/06/2015    Lab Results  Component Value Date   TSH 3.210 08/06/2015       Component Value Date/Time   CHOL 244* 08/06/2015 1154   CHOL 241* 07/28/2014   HDL 57 08/06/2015 1154   HDL 66 07/28/2014   CHOLHDL 4.3 08/06/2015 1154   LDLCALC 163* 08/06/2015 1154   LDLCALC 158 07/28/2014    Lab Results  Component Value Date   AST 23 08/06/2015   Lab Results  Component Value Date   ALT 13 08/06/2015       Urinalysis Results for orders placed or performed in visit on 10/20/15  CULTURE, URINE COMPREHENSIVE  Result Value Ref Range   Urine Culture, Comprehensive Final report (A)    Result 1 Proteus mirabilis (A)    ANTIMICROBIAL SUSCEPTIBILITY Comment   Microscopic Examination  Result Value Ref Range   WBC, UA 11-30 (A) 0 -  5 /hpf   RBC, UA 0-2 0 -  2 /hpf   Epithelial Cells  (non renal) 0-10 0 - 10 /hpf   Bacteria, UA None seen None seen/Few  Urinalysis, Complete  Result Value Ref Range   Specific Gravity, UA 1.015 1.005 - 1.030   pH, UA 5.5 5.0 - 7.5   Color, UA Yellow Yellow   Appearance Ur Clear Clear   Leukocytes, UA 1+ (A) Negative   Protein, UA Negative Negative/Trace   Glucose, UA Negative Negative   Ketones, UA Negative Negative   RBC, UA Negative Negative   Bilirubin, UA Negative Negative   Urobilinogen, Ur 0.2 0.2 - 1.0 mg/dL   Nitrite, UA Negative Negative   Microscopic Examination See below:   BLADDER SCAN AMB NON-IMAGING  Result Value Ref Range   Scan Result 59     Pertinent Imaging: Results for FELISIA, UHLMANN (MRN [id number]) as of 10/20/2015 16:39  Ref. Range 10/20/2015 16:27  Scan Result Unknown 59    Assessment & Plan:    1. Recurrent UTI:   Patient's urine will be sent for culture. I will not prescribe an antibiotic at this time due to patient's multiple antibiotic allergies.  We will need to obtain a CATH specimen in the future to ensure clearance of the infection.  If CATH specimens are positive for infection, we will consult ID.  I will also obtain a KUB and renal ultrasound to evaluate for nephrolithiasis as a nidus for infection.  She will contact the office if she develops fevers or gross hematuria.  - Urinalysis, Complete - CULTURE, URINE COMPREHENSIVE  2. Vaginal atrophy:   Patient was given a sample of vaginal estrogen cream and instructed to apply 0.5mg  (pea-sized amount)  just inside the vaginal introitus with a finger-tip every night for two weeks and then Monday, Wednesday and Friday nights.  I explained to the patient that vaginally administered estrogen, which causes only a slight increase in the blood estrogen levels, have fewer contraindications and adverse systemic effects that oral HT.  She will be RTC in 2 weeks for examination.  3. Mixed urinary incontinence:   We will continue  to monitor to see if the incontinence  improves once urinary tract infection is treated.  - BLADDER SCAN AMB NON-IMAGING   Return in about 2 weeks (around 11/03/2015) for exam, RUS and KUB report.  These notes generated with voice recognition software. I apologize for typographical errors.  Zara Council, [facility] [address], [place], [place] [address] [phone]

## 2015-10-22 LAB — CULTURE, URINE COMPREHENSIVE

## 2015-10-23 ENCOUNTER — Telehealth: Payer: Self-pay

## 2015-10-23 DIAGNOSIS — N390 Urinary tract infection, site not specified: Secondary | ICD-10-CM

## 2015-10-23 MED ORDER — FOSFOMYCIN TROMETHAMINE 3 G PO PACK: PACK | ORAL | Status: DC

## 2015-10-23 NOTE — Telephone Encounter (Signed)
Would you call in fosomycin 3 grams every other day for three days with an 8 oz of water?  We then need to recheck her urine 3 to 5 days after she completes her antibiotic.

## 2015-10-23 NOTE — Telephone Encounter (Signed)
-----   Message from Nori Riis, PA-C sent at 10/23/2015  8:13 AM EDT ----- Please contact the patient and ask her what allergic reaction she had when she took ciprofloxacin.

## 2015-10-23 NOTE — Telephone Encounter (Signed)
Spoke with pt in reference to abx. Pt voiced understanding.  Pt stated that she was given estrace cream while in the office and she was no longer going to use the medication. Pt stated that she used the medication until last night. Pt stated she then read the side effects and decided that she did not want to adopt any of them. Please advise.

## 2015-10-23 NOTE — Telephone Encounter (Signed)
Spoke with pt in reference to allergy to cipro. Pt stated she was unaware of being allergic to cipro and is not sure what cipro is for. Made pt aware it is an abx and our purposes of the medication would be for a UTI. Pt voiced understanding and requested nurse call pharmacy. Spoke with pharmacist at [facility] who stated she has under her records that pt is allergic to all Floxins. Pharmacy is also unaware of what the reaction is.

## 2015-10-24 DIAGNOSIS — N952 Postmenopausal atrophic vaginitis: Secondary | ICD-10-CM | POA: Insufficient documentation

## 2015-10-24 DIAGNOSIS — N3000 Acute cystitis without hematuria: Secondary | ICD-10-CM | POA: Insufficient documentation

## 2015-10-24 DIAGNOSIS — N3946 Mixed incontinence: Secondary | ICD-10-CM | POA: Insufficient documentation

## 2015-10-26 NOTE — Telephone Encounter (Signed)
Would you call th patient and ask her how she is tolerating the Monurol?

## 2015-10-26 NOTE — Telephone Encounter (Signed)
Called to check on pt in reference to monurol and pt stated very clearly that she was not going to take the medication. Pt stated that she has spoken with her family and she is not going to take the abx and will not return to our clinic. Pt voiced concern and disappointed stating that she is 82 year old, she has a dislocated hip from being in the stirrups to long, she is not going to continue estrace cream due to the estrogen side effects, the bathrooms were filthy and described them as urine all over the floor, used urinals laying around and no vaginal napkins. Pt then requested to cancel all future appointments. Apologized to pt for her experience and reinforced with her to f/u with her PCP. Pt voiced understanding.

## 2015-11-02 ENCOUNTER — Ambulatory Visit: Payer: Self-pay

## 2015-11-05 ENCOUNTER — Other Ambulatory Visit: Payer: Self-pay | Admitting: Internal Medicine

## 2015-11-09 ENCOUNTER — Ambulatory Visit: Payer: Self-pay | Admitting: Urology

## 2015-11-10 ENCOUNTER — Ambulatory Visit: Payer: Self-pay | Admitting: Urology

## 2016-03-23 ENCOUNTER — Other Ambulatory Visit: Payer: Self-pay | Admitting: Internal Medicine

## 2016-03-23 ENCOUNTER — Ambulatory Visit (INDEPENDENT_AMBULATORY_CARE_PROVIDER_SITE_OTHER): Payer: Medicare PPO | Admitting: Internal Medicine

## 2016-03-23 ENCOUNTER — Ambulatory Visit: Payer: Self-pay | Admitting: Internal Medicine

## 2016-03-23 ENCOUNTER — Encounter: Payer: Self-pay | Admitting: Internal Medicine

## 2016-03-23 VITALS — BP 178/98 | HR 78 | Resp 16 | Ht 62.0 in | Wt 160.8 lb

## 2016-03-23 DIAGNOSIS — S63502A Unspecified sprain of left wrist, initial encounter: Secondary | ICD-10-CM

## 2016-03-23 DIAGNOSIS — Z23 Encounter for immunization: Secondary | ICD-10-CM

## 2016-03-23 DIAGNOSIS — I10 Essential (primary) hypertension: Secondary | ICD-10-CM | POA: Diagnosis not present

## 2016-03-23 DIAGNOSIS — E1151 Type 2 diabetes mellitus with diabetic peripheral angiopathy without gangrene: Secondary | ICD-10-CM

## 2016-03-23 DIAGNOSIS — N3000 Acute cystitis without hematuria: Secondary | ICD-10-CM

## 2016-03-23 LAB — POC URINALYSIS WITH MICROSCOPIC (NON AUTO)MANUAL RESULT
Bilirubin, UA: NEGATIVE
Crystals: 0
EPITHELIAL CELLS, URINE PER MICROSCOPY: 0
Glucose, UA: NEGATIVE
KETONES UA: NEGATIVE
MUCUS UA: 0
NITRITE UA: NEGATIVE
PH UA: 6
PROTEIN UA: NEGATIVE
RBC UA: NEGATIVE
RBC: 0 M/uL — AB (ref 4.04–5.48)
SPEC GRAV UA: 1.01
Urobilinogen, UA: 0.2
WBC CASTS UA: 5

## 2016-03-23 MED ORDER — CIPROFLOXACIN HCL 250 MG PO TABS: 250.0000 mg | ORAL_TABLET | Freq: Two times a day (BID) | ORAL | 0 refills | Status: DC

## 2016-03-23 NOTE — Progress Notes (Signed)
Date:  03/23/2016   Name:  Laura [name]   DOB:  15-Apr-1925   MRN:  [id number]   Chief Complaint: Wrist Injury (left wrist hurt stopping ); Diabetes (follow up); and Cystitis (Seen by urology- had a very unpleasant visit. Nurse and PA Cogdill was very rude and not helpful asked to follow yellow line to bathroom with no assistance. Bathroom was very unsanitary and floor had urine on floor that was wet and fresh and patient was afraid to pull pants down with all urine on floor. Nurse rushed her and she never helped her get to or from as well as didnt offer a hat. Urinals were sitting all over the sink. )  Recurrent cystitis - will not go back to Urology (see nursing notes and chart notes from May).  Allergic to multiple antibiotics by report but does not know some of the reactions.  Was prescribed Monurol but never took it. She has "floxins" listed as allergies but she can not recall taking Cipro or what the reaction might be.  Wrist Injury   Incident onset: one month ago. The incident occurred at home. The injury mechanism was a fall (reached out to catch herself). The pain is present in the left wrist. The quality of the pain is described as aching. The pain does not radiate. The pain is at a severity of 3/10. The pain has been fluctuating since the incident. Pertinent negatives include no chest pain.  Diabetes  She presents for her follow-up diabetic visit. She has type 2 diabetes mellitus. Her disease course has been stable. Pertinent negatives for diabetes include no chest pain and no fatigue. There are no hypoglycemic complications. Symptoms are stable. Current diabetic treatment includes oral agent (monotherapy). Her breakfast blood glucose is taken between 7-8 am. Her breakfast blood glucose range is generally 110-130 mg/dl.  Hypertension  This is a chronic problem. The current episode started more than 1 year ago. The problem is unchanged. The problem is controlled. Pertinent negatives include  no chest pain, palpitations or shortness of breath.    Lab Results  Component Value Date   HGBA1C 5.7 (H) 08/06/2015     Review of Systems  Constitutional: Negative for chills, fatigue and fever.  Respiratory: Negative for cough, chest tightness and shortness of breath.   Cardiovascular: Negative for chest pain, palpitations and leg swelling.  Gastrointestinal: Negative for abdominal pain.  Genitourinary: Positive for dysuria and urgency.  Musculoskeletal: Positive for arthralgias, back pain, gait problem and joint swelling.    Patient Active Problem List   Diagnosis Date Noted  . Acute cystitis without hematuria 10/24/2015  . Vaginal atrophy 10/24/2015  . Mixed stress and urge urinary incontinence 10/24/2015  . Vasculitis ([place]) 09/21/2015  . Chronic renal insufficiency 09/16/2015  . DM (diabetes mellitus), type 2 with renal complications ([place]) 123XX123  . Carpal tunnel syndrome 01/16/2015  . Cervical pain 01/16/2015  . DM (diabetes mellitus), type 2 with peripheral vascular complications ([place]) [id number]  . Dyslipidemia 01/16/2015  . Allergic state 01/16/2015  . Essential (primary) hypertension 01/16/2015  . Amputated great toe ([place]) 01/16/2015  . Calcium blood increased 01/16/2015  . Local edema 01/16/2015  . Lumbar radiculopathy 01/16/2015  . Mixed incontinence 01/16/2015  . MGUS (monoclonal gammopathy of unknown significance) 01/16/2015  . Peripheral blood vessel disorder ([place]) 01/16/2015  . Dermatitis 01/16/2015  . Hypertensive left ventricular hypertrophy 07/17/2014  . MI (mitral incompetence) 07/17/2014  . TI (tricuspid incompetence) 07/17/2014    Prior to Admission  medications   Medication Sig Start Date End Date Taking? Authorizing Provider  amLODipine (NORVASC) 2.5 MG tablet Take 1 tablet by mouth daily.   Yes Historical Provider, MD  aspirin 81 MG chewable tablet Chew 1 tablet by mouth daily.   Yes Historical Provider, MD  cholecalciferol (VITAMIN D) 1000  units tablet Take 1,000 Units by mouth daily.   Yes Historical Provider, MD  clobetasol ointment (TEMOVATE) 0.05 % CLOBETASOL PROPIONATE, 0.05% (External Ointment) - Historical Medication  appication two times daily (0.05 %) Active   Yes Historical Provider, MD  cloNIDine (CATAPRES) 0.2 MG tablet Take 1 tablet (0.2 mg total) by mouth 2 (two) times daily. 08/06/15  Yes Glean Hess, MD  glucose blood (ACCU-CHEK AVIVA PLUS) test strip 1 each by Other route daily. Dx: E11.51 08/06/15  Yes Glean Hess, MD  ibuprofen (ADVIL,MOTRIN) 200 MG tablet Take 400 mg by mouth 2 (two) times daily.    Yes Historical Provider, MD  losartan (COZAAR) 100 MG tablet TAKE ONE TABLET BY MOUTH ONCE DAILY 11/05/15  Yes Glean Hess, MD  metFORMIN (GLUCOPHAGE) 500 MG tablet Take 1 tablet (500 mg total) by mouth daily. 08/06/15  Yes Glean Hess, MD  mometasone (NASONEX) 50 MCG/ACT nasal spray Place 2 sprays into the nose daily as needed. 05/12/14  Yes Historical Provider, MD  triamterene-hydrochlorothiazide (MAXZIDE-25) 37.5-25 MG tablet Take 1 tablet by mouth daily. 08/06/15  Yes Glean Hess, MD  acetaminophen (TYLENOL) 500 MG tablet Take 500 mg by mouth every 6 (six) hours as needed.    Historical Provider, MD  fexofenadine (ALLEGRA) 180 MG tablet Take 1 tablet by mouth daily as needed. 12/11/12   Historical Provider, MD  glucosamine-chondroitin 500-400 MG tablet Take 1 tablet by mouth 3 (three) times daily.    Historical Provider, MD    Allergies  Allergen Reactions  . Augmentin [Amoxicillin-Pot Clavulanate] Other (See Comments)    Vasculitis  . Calcium Channel Blockers Shortness Of Breath  . Nitrofurantoin Shortness Of Breath  . Ace Inhibitors Cough  . Beta Adrenergic Blockers     Other reaction(s): Headache  . Ciprofloxacin     Pt is allergic to all FLOXINS-unsure of reaction  . Statins     weakness  . Sulfa Antibiotics     Constipation and Rash  . Clindamycin/Lincomycin Rash    Past  Surgical History:  Procedure Laterality Date  . APPENDECTOMY    . bone marrow withdrawal    . colon polyp removal    . [place]  . TOE AMPUTATION Left 08/2013  . TOTAL ABDOMINAL HYSTERECTOMY  1963    Social History  Substance Use Topics  . Smoking status: Never Smoker  . Smokeless tobacco: Never Used  . Alcohol use No     Medication list has been reviewed and updated.   Physical Exam  Constitutional: She is oriented to person, place, and time. She appears well-developed. No distress.  Cardiovascular: Normal rate, regular rhythm and normal heart sounds.   Pulmonary/Chest: Effort normal and breath sounds normal.  Abdominal: There is no tenderness.  Musculoskeletal:  Left wrist slightly swollen - good range of motion without pain.  Grip 4/5.  No erythema. Mild bony deformity c/w OA changed.  No tenderness to palpation.  Neurological: She is alert and oriented to person, place, and time. Gait abnormal.  Psychiatric: She has a normal mood and affect. Cognition and memory are normal.    BP (!) 178/98   Pulse 78  Resp 16   Ht 5\' 2"  (1.575 m)   Wt 160 lb 12.8 oz (72.9 kg)   SpO2 97%   BMI 29.41 kg/m   Assessment and Plan: 1. Acute cystitis without hematuria Will send for culture Pt will try Cipro with caution - POC urinalysis w microscopic (non auto) - Urine culture - ciprofloxacin (CIPRO) 250 MG tablet; Take 1 tablet (250 mg total) by mouth 2 (two) times daily.  Dispense: 10 tablet; Refill: 0  2. Wrist sprain, left, initial encounter Continue splint and Advil as needed Consider Xray for occult fracture if no gradual improvement  3. Need for influenza vaccination - Flu Vaccine QUAD 36+ mos IM  4. DM (diabetes mellitus), type 2 with peripheral vascular complications ([place]) Stable Labs next visit  5. Essential (primary) hypertension controlled   Halina Maidens, MD [facility]  03/23/2016

## 2016-03-23 NOTE — Patient Instructions (Signed)
Pick up Cipro prescription.  Take only one a day for 2 days and if tolerated, then finish the prescription by taking it twice a day.

## 2016-03-25 LAB — URINE CULTURE

## 2016-04-01 ENCOUNTER — Other Ambulatory Visit: Payer: Self-pay | Admitting: Internal Medicine

## 2016-04-01 ENCOUNTER — Telehealth: Payer: Self-pay

## 2016-04-01 MED ORDER — CEPHALEXIN 500 MG PO CAPS: 500.0000 mg | ORAL_CAPSULE | Freq: Four times a day (QID) | ORAL | 0 refills | Status: DC

## 2016-04-01 NOTE — Telephone Encounter (Signed)
Please send the Keflex ASAP. I spoke to her and I thought I sent you message stating YES she can try Keflex but they went to get and there was no RX called in.

## 2016-04-20 ENCOUNTER — Ambulatory Visit (INDEPENDENT_AMBULATORY_CARE_PROVIDER_SITE_OTHER): Payer: Medicare PPO | Admitting: Internal Medicine

## 2016-04-20 ENCOUNTER — Encounter: Payer: Self-pay | Admitting: Internal Medicine

## 2016-04-20 VITALS — BP 140/82 | HR 74 | Resp 16 | Ht 62.0 in | Wt 160.0 lb

## 2016-04-20 DIAGNOSIS — E1151 Type 2 diabetes mellitus with diabetic peripheral angiopathy without gangrene: Secondary | ICD-10-CM

## 2016-04-20 DIAGNOSIS — I10 Essential (primary) hypertension: Secondary | ICD-10-CM

## 2016-04-20 DIAGNOSIS — N3000 Acute cystitis without hematuria: Secondary | ICD-10-CM

## 2016-04-20 DIAGNOSIS — M5416 Radiculopathy, lumbar region: Secondary | ICD-10-CM

## 2016-04-20 LAB — POC URINALYSIS WITH MICROSCOPIC (NON AUTO)MANUAL RESULT
BACTERIA UA: 0
BILIRUBIN UA: NEGATIVE
Blood, UA: NEGATIVE
Crystals: 0
Epithelial cells, urine per micros: 2
GLUCOSE UA: NEGATIVE
Ketones, UA: NEGATIVE
Leukocytes, UA: NEGATIVE
Mucus, UA: 0
Nitrite, UA: NEGATIVE
Protein, UA: NEGATIVE
RBC: 0 M/uL — AB (ref 4.04–5.48)
Spec Grav, UA: 1.02
Urobilinogen, UA: 0.2
WBC Casts, UA: 2
pH, UA: 6.5

## 2016-04-20 NOTE — Progress Notes (Signed)
Date:  04/20/2016   Name:  Laura [name]   DOB:  Apr 26, 1925   MRN:  [id number]   Chief Complaint: Diabetes (BS 98) and Urinary Tract Infection Diabetes  She presents for her follow-up diabetic visit. She has type 2 diabetes mellitus. Pertinent negatives for hypoglycemia include no dizziness. Pertinent negatives for diabetes include no chest pain. Symptoms are stable. She monitors blood glucose at home 3-4 x per week. Her breakfast blood glucose is taken between 7-8 am. Her breakfast blood glucose range is generally 90-110 mg/dl.  Urinary Tract Infection   This is a recurrent problem. There has been no fever. Pertinent negatives include no chills, hematuria or urgency. She has tried antibiotics (tolerated Keflex) for the symptoms. Her past medical history is significant for recurrent UTIs.    Lab Results  Component Value Date   HGBA1C 5.7 (H) 08/06/2015   Lab Results  Component Value Date   CREATININE 1.08 (H) 08/06/2015     Review of Systems  Constitutional: Negative for chills.  Respiratory: Negative for chest tightness and shortness of breath.   Cardiovascular: Negative for chest pain.  Genitourinary: Negative for dysuria, hematuria and urgency.  Musculoskeletal: Positive for back pain and gait problem.  Neurological: Negative for dizziness.    Patient Active Problem List   Diagnosis Date Noted  . Wrist sprain, left, initial encounter 03/23/2016  . Acute cystitis without hematuria 10/24/2015  . Vaginal atrophy 10/24/2015  . Mixed stress and urge urinary incontinence 10/24/2015  . Vasculitis ([place]) 09/21/2015  . Chronic renal insufficiency 09/16/2015  . DM (diabetes mellitus), type 2 with renal complications ([place]) 123XX123  . Carpal tunnel syndrome 01/16/2015  . Cervical pain 01/16/2015  . DM (diabetes mellitus), type 2 with peripheral vascular complications ([place]) [id number]  . Dyslipidemia 01/16/2015  . Allergic state 01/16/2015  . Essential (primary) hypertension  01/16/2015  . Amputated great toe ([place]) 01/16/2015  . Calcium blood increased 01/16/2015  . Local edema 01/16/2015  . Lumbar radiculopathy 01/16/2015  . Mixed incontinence 01/16/2015  . MGUS (monoclonal gammopathy of unknown significance) 01/16/2015  . Peripheral blood vessel disorder ([place]) 01/16/2015  . Dermatitis 01/16/2015  . Hypertensive left ventricular hypertrophy 07/17/2014  . MI (mitral incompetence) 07/17/2014  . TI (tricuspid incompetence) 07/17/2014    Prior to Admission medications   Medication Sig Start Date End Date Taking? Authorizing Provider  acetaminophen (TYLENOL) 500 MG tablet Take 500 mg by mouth every 6 (six) hours as needed.    Historical Provider, MD  amLODipine (NORVASC) 2.5 MG tablet Take 1 tablet by mouth daily.    Historical Provider, MD  aspirin 81 MG chewable tablet Chew 1 tablet by mouth daily.    Historical Provider, MD  cephALEXin (KEFLEX) 500 MG capsule Take 1 capsule (500 mg total) by mouth 4 (four) times daily. 04/01/16   Glean Hess, MD  cholecalciferol (VITAMIN D) 1000 units tablet Take 1,000 Units by mouth daily.    Historical Provider, MD  clobetasol ointment (TEMOVATE) 0.05 % CLOBETASOL PROPIONATE, 0.05% (External Ointment) - Historical Medication  appication two times daily (0.05 %) Active    Historical Provider, MD  cloNIDine (CATAPRES) 0.2 MG tablet Take 1 tablet (0.2 mg total) by mouth 2 (two) times daily. 08/06/15   Glean Hess, MD  fexofenadine (ALLEGRA) 180 MG tablet Take 1 tablet by mouth daily as needed. 12/11/12   Historical Provider, MD  glucosamine-chondroitin 500-400 MG tablet Take 1 tablet by mouth 3 (three) times daily.    Historical  Provider, MD  glucose blood (ACCU-CHEK AVIVA PLUS) test strip 1 each by Other route daily. Dx: E11.51 08/06/15   Glean Hess, MD  ibuprofen (ADVIL,MOTRIN) 200 MG tablet Take 400 mg by mouth 2 (two) times daily.     Historical Provider, MD  losartan (COZAAR) 100 MG tablet TAKE ONE TABLET BY  MOUTH ONCE DAILY 11/05/15   Glean Hess, MD  metFORMIN (GLUCOPHAGE) 500 MG tablet Take 1 tablet (500 mg total) by mouth daily. 08/06/15   Glean Hess, MD  mometasone (NASONEX) 50 MCG/ACT nasal spray Place 2 sprays into the nose daily as needed. 05/12/14   Historical Provider, MD  triamterene-hydrochlorothiazide (MAXZIDE-25) 37.5-25 MG tablet Take 1 tablet by mouth daily. 08/06/15   Glean Hess, MD    Allergies  Allergen Reactions  . Augmentin [Amoxicillin-Pot Clavulanate] Other (See Comments)    Vasculitis  . Calcium Channel Blockers Shortness Of Breath  . Nitrofurantoin Shortness Of Breath  . Ace Inhibitors Cough  . Atorvastatin Other (See Comments)  . Beta Adrenergic Blockers     Other reaction(s): Headache  . Levaquin [Levofloxacin]     Unknown - extracted from old chart at [facility]  . Pravastatin Other (See Comments)  . Statins     weakness  . Sulfa Antibiotics     Constipation and Rash  . Clindamycin Rash  . Clindamycin/Lincomycin Rash    Past Surgical History:  Procedure Laterality Date  . APPENDECTOMY    . bone marrow withdrawal    . colon polyp removal    . [place]  . TOE AMPUTATION Left 08/2013  . TOTAL ABDOMINAL HYSTERECTOMY  1963    Social History  Substance Use Topics  . Smoking status: Never Smoker  . Smokeless tobacco: Never Used  . Alcohol use No     Medication list has been reviewed and updated.   Physical Exam  Constitutional: She is oriented to person, place, and time. She appears well-developed. No distress.  HENT:  Head: Normocephalic and atraumatic.  Cardiovascular: Normal rate, regular rhythm and normal heart sounds.   Pulmonary/Chest: Effort normal and breath sounds normal. No respiratory distress. She has no rales.  Abdominal: Soft. She exhibits no distension. There is no tenderness.  Musculoskeletal: She exhibits no edema or tenderness.  Neurological: She is alert and oriented to person, place, and  time. Gait abnormal. Abnormal coordination: uses 2 canes, kyphosis and shuffling gait.  Psychiatric: She has a normal mood and affect. Her behavior is normal. Thought content normal.  Nursing note and vitals reviewed.   BP 140/82   Pulse 74   Resp 16   Ht 5\' 2"  (1.575 m)   Wt 160 lb (72.6 kg)   SpO2 98%   BMI 29.26 kg/m   Assessment and Plan: 1. DM (diabetes mellitus), type 2 with peripheral vascular complications (HCC) stable - Hemoglobin A1c  2. Acute cystitis without hematuria resolved - POC urinalysis w microscopic (non auto)  3. Essential (primary) hypertension controlled - Comprehensive metabolic panel  4. Lumbar radiculopathy DMV form completed   Halina Maidens, MD [facility]  04/20/2016

## 2016-04-21 LAB — COMPREHENSIVE METABOLIC PANEL
ALBUMIN: 3.8 g/dL (ref 3.5–4.7)
ALK PHOS: 65 IU/L (ref 39–117)
ALT: 14 IU/L (ref 0–32)
AST: 20 IU/L (ref 0–40)
Albumin/Globulin Ratio: 1.5 (ref 1.2–2.2)
BUN / CREAT RATIO: 20 (ref 12–28)
BUN: 21 mg/dL (ref 8–27)
Bilirubin Total: 0.4 mg/dL (ref 0.0–1.2)
CO2: 24 mmol/L (ref 18–29)
CREATININE: 1.03 mg/dL — AB (ref 0.57–1.00)
Calcium: 10.4 mg/dL — ABNORMAL HIGH (ref 8.7–10.3)
Chloride: 96 mmol/L (ref 96–106)
GFR calc Af Amer: 56 mL/min/{1.73_m2} — ABNORMAL LOW (ref >59)
GFR calc non Af Amer: 48 mL/min/{1.73_m2} — ABNORMAL LOW (ref >59)
GLOBULIN, TOTAL: 2.6 g/dL (ref 1.5–4.5)
Glucose: 96 mg/dL (ref 65–99)
Potassium: 4.5 mmol/L (ref 3.5–5.2)
SODIUM: 135 mmol/L (ref 134–144)
Total Protein: 6.4 g/dL (ref 6.0–8.5)

## 2016-04-21 LAB — HEMOGLOBIN A1C
Est. average glucose Bld gHb Est-mCnc: 117 mg/dL
Hgb A1c MFr Bld: 5.7 % — ABNORMAL HIGH (ref 4.8–5.6)

## 2016-08-05 ENCOUNTER — Other Ambulatory Visit: Payer: Self-pay | Admitting: Internal Medicine

## 2016-08-05 DIAGNOSIS — I10 Essential (primary) hypertension: Secondary | ICD-10-CM

## 2016-08-19 ENCOUNTER — Other Ambulatory Visit: Payer: Self-pay | Admitting: Internal Medicine

## 2016-08-19 DIAGNOSIS — I10 Essential (primary) hypertension: Secondary | ICD-10-CM

## 2016-09-13 ENCOUNTER — Ambulatory Visit (INDEPENDENT_AMBULATORY_CARE_PROVIDER_SITE_OTHER): Payer: Medicare PPO | Admitting: Internal Medicine

## 2016-09-13 ENCOUNTER — Encounter: Payer: Self-pay | Admitting: Internal Medicine

## 2016-09-13 VITALS — BP 138/82 | HR 62 | Ht 62.0 in | Wt 163.0 lb

## 2016-09-13 DIAGNOSIS — I10 Essential (primary) hypertension: Secondary | ICD-10-CM | POA: Diagnosis not present

## 2016-09-13 DIAGNOSIS — M25421 Effusion, right elbow: Secondary | ICD-10-CM

## 2016-09-13 DIAGNOSIS — Z89412 Acquired absence of left great toe: Secondary | ICD-10-CM | POA: Diagnosis not present

## 2016-09-13 DIAGNOSIS — S98112A Complete traumatic amputation of left great toe, initial encounter: Secondary | ICD-10-CM

## 2016-09-13 DIAGNOSIS — M1711 Unilateral primary osteoarthritis, right knee: Secondary | ICD-10-CM | POA: Diagnosis not present

## 2016-09-13 DIAGNOSIS — E1151 Type 2 diabetes mellitus with diabetic peripheral angiopathy without gangrene: Secondary | ICD-10-CM

## 2016-09-13 DIAGNOSIS — M7031 Other bursitis of elbow, right elbow: Secondary | ICD-10-CM | POA: Insufficient documentation

## 2016-09-13 DIAGNOSIS — N3000 Acute cystitis without hematuria: Secondary | ICD-10-CM | POA: Diagnosis not present

## 2016-09-13 LAB — POC URINALYSIS WITH MICROSCOPIC (NON AUTO)MANUAL RESULT
Bilirubin, UA: NEGATIVE
Crystals: 0
EPITHELIAL CELLS, URINE PER MICROSCOPY: 2
Glucose, UA: NEGATIVE
KETONES UA: NEGATIVE
MUCUS UA: 0
NITRITE UA: POSITIVE
PROTEIN UA: NEGATIVE
RBC: 2 M/uL — AB (ref 4.04–5.48)
Spec Grav, UA: 1.01 (ref 1.030–1.035)
Urobilinogen, UA: 0.2 (ref ?–2.0)
pH, UA: 6 (ref 5.0–8.0)

## 2016-09-13 MED ORDER — MOMETASONE FUROATE 50 MCG/ACT NA SUSP: 2.0000 | Freq: Every day | NASAL | 3 refills | Status: AC | PRN

## 2016-09-13 MED ORDER — METFORMIN HCL 500 MG PO TABS: 500.0000 mg | ORAL_TABLET | Freq: Every day | ORAL | 3 refills | Status: AC

## 2016-09-13 MED ORDER — CEPHALEXIN 500 MG PO CAPS: 500.0000 mg | ORAL_CAPSULE | Freq: Four times a day (QID) | ORAL | 0 refills | Status: DC

## 2016-09-13 NOTE — Progress Notes (Signed)
Date:  09/13/2016   Name:  Laura [name]   DOB:  [birth year]   MRN:  [id number]   Chief Complaint: Urinary Tract Infection (Also, needs refill on Metformin.) Urinary Tract Infection   This is a new problem. The current episode started in the past 7 days. The problem occurs every urination. The problem has been unchanged. The quality of the pain is described as aching. The patient is experiencing no pain. There has been no fever. Associated symptoms include frequency. Pertinent negatives include no chills, flank pain, hematuria or hesitancy.  Diabetes  She presents for her follow-up diabetic visit. She has type 2 diabetes mellitus. Hypoglycemia symptoms include tremors (intermittent). Pertinent negatives for hypoglycemia include no dizziness or headaches. Pertinent negatives for diabetes include no chest pain, no fatigue, no polydipsia, no polyuria and no weakness. She monitors blood glucose at home 1-2 x per day. Her breakfast blood glucose is taken between 6-7 am. Her breakfast blood glucose range is generally 70-90 mg/dl.  Knee Pain   There was no injury mechanism. The pain is present in the right knee. The quality of the pain is described as aching. The pain is moderate. The pain has been fluctuating since onset. The symptoms are aggravated by movement and weight bearing.  Elbow swelling - started after a fall several months ago.  Not painful unless she applies direct pressure.  The size seems to be slowly decreasing.  She has full range of motion and normal strength.  Lab Results  Component Value Date   HGBA1C 5.7 (H) 04/20/2016     Review of Systems  Constitutional: Negative for chills and fatigue.  Respiratory: Negative for cough, chest tightness, shortness of breath and wheezing.   Cardiovascular: Negative for chest pain, palpitations and leg swelling.  Gastrointestinal: Negative for abdominal pain.  Endocrine: Negative for polydipsia and polyuria.  Genitourinary: Positive for  frequency. Negative for flank pain, hematuria and hesitancy.  Musculoskeletal: Positive for arthralgias, joint swelling and myalgias.  Neurological: Positive for tremors (intermittent). Negative for dizziness, weakness and headaches.  Psychiatric/Behavioral: Negative for sleep disturbance.    Patient Active Problem List   Diagnosis Date Noted  . Arthritis of knee, right 09/13/2016  . Wrist sprain, left, initial encounter 03/23/2016  . Acute cystitis without hematuria 10/24/2015  . Vaginal atrophy 10/24/2015  . Mixed stress and urge urinary incontinence 10/24/2015  . Vasculitis ([place]) 09/21/2015  . Chronic renal insufficiency 09/16/2015  . DM (diabetes mellitus), type 2 with renal complications ([place]) 03/54/6568  . Carpal tunnel syndrome 01/16/2015  . Cervical pain 01/16/2015  . DM (diabetes mellitus), type 2 with peripheral vascular complications ([place]) 12/75/1700  . Dyslipidemia 01/16/2015  . Allergic state 01/16/2015  . Essential (primary) hypertension 01/16/2015  . Amputated great toe ([place]) 01/16/2015  . Calcium blood increased 01/16/2015  . Local edema 01/16/2015  . Lumbar radiculopathy 01/16/2015  . Mixed incontinence 01/16/2015  . MGUS (monoclonal gammopathy of unknown significance) 01/16/2015  . Peripheral blood vessel disorder ([place]) 01/16/2015  . Dermatitis 01/16/2015  . Hypertensive left ventricular hypertrophy 07/17/2014  . MI (mitral incompetence) 07/17/2014  . TI (tricuspid incompetence) 07/17/2014    Prior to Admission medications   Medication Sig Start Date End Date Taking? Authorizing Provider  acetaminophen (TYLENOL) 500 MG tablet Take 500 mg by mouth every 6 (six) hours as needed.   Yes Historical Provider, MD  amLODipine (NORVASC) 2.5 MG tablet Take 1 tablet by mouth daily.   Yes Historical Provider, MD  aspirin 81  MG chewable tablet Chew 1 tablet by mouth daily.   Yes Historical Provider, MD  cholecalciferol (VITAMIN D) 1000 units tablet Take 1,000 Units by  mouth daily.   Yes Historical Provider, MD  clobetasol ointment (TEMOVATE) 0.05 % CLOBETASOL PROPIONATE, 0.05% (External Ointment) - Historical Medication  appication two times daily (0.05 %) Active   Yes Historical Provider, MD  cloNIDine (CATAPRES) 0.2 MG tablet TAKE ONE TABLET BY MOUTH TWICE DAILY 08/19/16  Yes Glean Hess, MD  fexofenadine (ALLEGRA) 180 MG tablet Take 1 tablet by mouth daily as needed. 12/11/12  Yes Historical Provider, MD  glucose blood (ACCU-CHEK AVIVA PLUS) test strip 1 each by Other route daily. Dx: E11.51 08/06/15  Yes Glean Hess, MD  ibuprofen (ADVIL,MOTRIN) 200 MG tablet Take 400 mg by mouth 2 (two) times daily.    Yes Historical Provider, MD  losartan (COZAAR) 100 MG tablet TAKE ONE TABLET BY MOUTH ONCE DAILY 11/05/15  Yes Glean Hess, MD  metFORMIN (GLUCOPHAGE) 500 MG tablet Take 1 tablet (500 mg total) by mouth daily. 08/06/15  Yes Glean Hess, MD  mometasone (NASONEX) 50 MCG/ACT nasal spray Place 2 sprays into the nose daily as needed. 05/12/14  Yes Historical Provider, MD  triamterene-hydrochlorothiazide (MAXZIDE-25) 37.5-25 MG tablet TAKE ONE TABLET BY MOUTH ONCE DAILY 08/05/16  Yes Glean Hess, MD    Allergies  Allergen Reactions  . Augmentin [Amoxicillin-Pot Clavulanate] Other (See Comments)    Vasculitis  . Calcium Channel Blockers Shortness Of Breath  . Nitrofurantoin Shortness Of Breath  . Ace Inhibitors Cough  . Atorvastatin Other (See Comments)  . Beta Adrenergic Blockers     Other reaction(s): Headache  . Levaquin [Levofloxacin]     Unknown - extracted from old chart at [place]  . Pravastatin Other (See Comments)  . Statins     weakness  . Sulfa Antibiotics     Constipation and Rash  . Clindamycin Rash  . Clindamycin/Lincomycin Rash    Past Surgical History:  Procedure Laterality Date  . APPENDECTOMY    . bone marrow withdrawal    . colon polyp removal    . [place]  . TOE AMPUTATION Left  08/2013  . TOTAL ABDOMINAL HYSTERECTOMY  1963    Social History  Substance Use Topics  . Smoking status: Never Smoker  . Smokeless tobacco: Never Used  . Alcohol use No     Medication list has been reviewed and updated.   Physical Exam  Constitutional: She is oriented to person, place, and time. She appears well-developed. No distress.  HENT:  Head: Normocephalic and atraumatic.  Neck: Normal range of motion.  Cardiovascular: Normal rate, regular rhythm and normal heart sounds.   Pulmonary/Chest: Effort normal and breath sounds normal. No respiratory distress.  Abdominal: Soft. Bowel sounds are normal. There is no tenderness.  Musculoskeletal: Normal range of motion.       Right elbow: She exhibits effusion (large bursal fluid sac).       Right knee: Tenderness found. Medial joint line (with crepitus) tenderness noted.  Neurological: She is alert and oriented to person, place, and time.  Skin: Skin is warm and dry. No rash noted.  Psychiatric: She has a normal mood and affect. Her speech is normal and behavior is normal. Thought content normal.  Nursing note and vitals reviewed.   BP 138/82   Pulse 62   Ht 5\' 2"  (1.575 m)   Wt 163 lb (73.9 kg)  BMI 29.81 kg/m   Assessment and Plan: 1. Acute cystitis without hematuria Take keflex; increase fluids - POC urinalysis w microscopic (non auto)  2. DM (diabetes mellitus), type 2 with peripheral vascular complications (HCC) controlled - metFORMIN (GLUCOPHAGE) 500 MG tablet; Take 1 tablet (500 mg total) by mouth daily.  Dispense: 90 tablet; Refill: 3 - Hemoglobin T0P - Basic metabolic panel  3. Essential (primary) hypertension stable  4. Amputated great toe of left foot ([place]) healed  5. Arthritis of knee, right Consult Orthopedics  6. Effusion of bursa of right elbow Monitor for worsening, s/s infection, etc    Meds ordered this encounter  Medications  . metFORMIN (GLUCOPHAGE) 500 MG tablet    Sig: Take 1  tablet (500 mg total) by mouth daily.    Dispense:  90 tablet    Refill:  3  . mometasone (NASONEX) 50 MCG/ACT nasal spray    Sig: Place 2 sprays into the nose daily as needed.    Dispense:  17 g    Refill:  3  . cephALEXin (KEFLEX) 500 MG capsule    Sig: Take 1 capsule (500 mg total) by mouth 4 (four) times daily.    Dispense:  28 capsule    Refill:  0    Halina Maidens, MD [facility]  09/13/2016

## 2016-09-13 NOTE — Patient Instructions (Signed)
Consult Dr. Leanor Kail regarding knee pain and arthritis.

## 2016-09-14 LAB — BASIC METABOLIC PANEL
BUN / CREAT RATIO: 21 (ref 12–28)
BUN: 21 mg/dL (ref 8–27)
CHLORIDE: 97 mmol/L (ref 96–106)
CO2: 26 mmol/L (ref 18–29)
CREATININE: 1.02 mg/dL — AB (ref 0.57–1.00)
Calcium: 10.1 mg/dL (ref 8.7–10.3)
GFR calc Af Amer: 56 mL/min/{1.73_m2} — ABNORMAL LOW (ref >59)
GFR calc non Af Amer: 49 mL/min/{1.73_m2} — ABNORMAL LOW (ref >59)
GLUCOSE: 101 mg/dL — AB (ref 65–99)
Potassium: 4.4 mmol/L (ref 3.5–5.2)
Sodium: 138 mmol/L (ref 134–144)

## 2016-09-14 LAB — HEMOGLOBIN A1C
Est. average glucose Bld gHb Est-mCnc: 114 mg/dL
Hgb A1c MFr Bld: 5.6 % (ref 4.8–5.6)

## 2016-11-01 ENCOUNTER — Other Ambulatory Visit: Payer: Self-pay | Admitting: Internal Medicine

## 2016-12-19 ENCOUNTER — Encounter: Payer: Self-pay | Admitting: Internal Medicine

## 2016-12-19 ENCOUNTER — Ambulatory Visit (INDEPENDENT_AMBULATORY_CARE_PROVIDER_SITE_OTHER): Payer: Medicare PPO | Admitting: Internal Medicine

## 2016-12-19 VITALS — BP 134/82 | HR 74 | Temp 98.4°F | Ht 62.0 in | Wt 147.0 lb

## 2016-12-19 DIAGNOSIS — I872 Venous insufficiency (chronic) (peripheral): Secondary | ICD-10-CM

## 2016-12-19 DIAGNOSIS — L03116 Cellulitis of left lower limb: Secondary | ICD-10-CM | POA: Diagnosis not present

## 2016-12-19 MED ORDER — CEPHALEXIN 500 MG PO CAPS: 500.0000 mg | ORAL_CAPSULE | Freq: Four times a day (QID) | ORAL | 0 refills | Status: DC

## 2016-12-19 NOTE — Progress Notes (Signed)
Date:  12/19/2016   Name:  Laura [name]   DOB:  Dec 24, 1924   MRN:  [id number]   Chief Complaint: Wound Infection (Bad this morning.- Popped up about 4- 5 months ago. Wasn't concerning at first. Last week it has gotten worse. Wound is on left ankle. Patient has been using neosporin ointment with gauze. No Fever noticed. Red area around the wound is going up leg. Would like referral to see Dr Ola Spurr- infection disease doctor. ) and Weight Loss (Concerned about weight loss. Weighed 163 in march and now weighing 147. )  HPI   Review of Systems  Constitutional: Negative for chills, fatigue and fever.  Eyes: Negative for visual disturbance.  Respiratory: Negative for chest tightness and shortness of breath.   Cardiovascular: Negative for chest pain.  Gastrointestinal: Negative for abdominal pain, constipation and vomiting.  Musculoskeletal: Positive for arthralgias and joint swelling.  Skin: Positive for color change, rash and wound.  Neurological: Negative for dizziness.  Psychiatric/Behavioral: Negative for confusion and sleep disturbance.    Patient Active Problem List   Diagnosis Date Noted  . Arthritis of knee, right 09/13/2016  . Effusion of bursa of right elbow 09/13/2016  . Wrist sprain, left, initial encounter 03/23/2016  . Acute cystitis without hematuria 10/24/2015  . Vaginal atrophy 10/24/2015  . Mixed stress and urge urinary incontinence 10/24/2015  . Vasculitis ([place]) 09/21/2015  . Chronic renal insufficiency 09/16/2015  . DM (diabetes mellitus), type 2 with renal complications ([place]) 87/56/4332  . Carpal tunnel syndrome 01/16/2015  . Cervical pain 01/16/2015  . DM (diabetes mellitus), type 2 with peripheral vascular complications ([place]) 95/18/8416  . Dyslipidemia 01/16/2015  . Allergic state 01/16/2015  . Essential (primary) hypertension 01/16/2015  . Amputated great toe ([place]) 01/16/2015  . Calcium blood increased 01/16/2015  . Local edema 01/16/2015  . Lumbar  radiculopathy 01/16/2015  . Mixed incontinence 01/16/2015  . MGUS (monoclonal gammopathy of unknown significance) 01/16/2015  . Peripheral blood vessel disorder ([place]) 01/16/2015  . Dermatitis 01/16/2015  . Hypertensive left ventricular hypertrophy 07/17/2014  . MI (mitral incompetence) 07/17/2014  . TI (tricuspid incompetence) 07/17/2014    Prior to Admission medications   Medication Sig Start Date End Date Taking? Authorizing Provider  acetaminophen (TYLENOL) 500 MG tablet Take 500 mg by mouth every 6 (six) hours as needed.   Yes [provider]  amLODipine (NORVASC) 2.5 MG tablet Take 1 tablet by mouth daily.   Yes [provider]  aspirin 81 MG chewable tablet Chew 1 tablet by mouth daily.   Yes [provider]  cephALEXin (KEFLEX) 500 MG capsule Take 1 capsule (500 mg total) by mouth 4 (four) times daily. 09/13/16  Yes Glean Hess, MD  cholecalciferol (VITAMIN D) 1000 units tablet Take 1,000 Units by mouth daily.   Yes [provider]  clobetasol ointment (TEMOVATE) 0.05 % CLOBETASOL PROPIONATE, 0.05% (External Ointment) - Historical Medication  appication two times daily (0.05 %) Active   Yes [provider]  cloNIDine (CATAPRES) 0.2 MG tablet TAKE ONE TABLET BY MOUTH TWICE DAILY 08/19/16  Yes Glean Hess, MD  fexofenadine (ALLEGRA) 180 MG tablet Take 1 tablet by mouth daily as needed. 12/11/12  Yes [provider]  glucose blood (ACCU-CHEK AVIVA PLUS) test strip 1 each by Other route daily. Dx: E11.51 08/06/15  Yes Glean Hess, MD  ibuprofen (ADVIL,MOTRIN) 200 MG tablet Take 400 mg by mouth 2 (two) times daily.    Yes [provider]  losartan (COZAAR) 100 MG tablet TAKE ONE TABLET BY MOUTH ONCE DAILY 11/01/16  Yes Glean Hess, MD  metFORMIN (GLUCOPHAGE) 500 MG tablet Take 1 tablet (500 mg total) by mouth daily. 09/13/16  Yes Glean Hess, MD  mometasone (NASONEX) 50 MCG/ACT nasal spray Place 2 sprays  into the nose daily as needed. 09/13/16  Yes Glean Hess, MD  triamterene-hydrochlorothiazide [facility]) 37.5-25 MG tablet TAKE ONE TABLET BY MOUTH ONCE DAILY 08/05/16  Yes Glean Hess, MD    Allergies  Allergen Reactions  . Augmentin [Amoxicillin-Pot Clavulanate] Other (See Comments)    Vasculitis  . Calcium Channel Blockers Shortness Of Breath  . Nitrofurantoin Shortness Of Breath  . Ace Inhibitors Cough  . Atorvastatin Other (See Comments)  . Beta Adrenergic Blockers     Other reaction(s): Headache  . Levaquin [Levofloxacin]     Unknown - extracted from old chart at [place]  . Pravastatin Other (See Comments)  . Statins     weakness  . Sulfa Antibiotics     Constipation and Rash  . Clindamycin Rash  . Clindamycin/Lincomycin Rash    Past Surgical History:  Procedure Laterality Date  . APPENDECTOMY    . bone marrow withdrawal    . colon polyp removal    . [place]  . TOE AMPUTATION Left 08/2013  . TOTAL ABDOMINAL HYSTERECTOMY  1963    Social History  Substance Use Topics  . Smoking status: Never Smoker  . Smokeless tobacco: Never Used  . Alcohol use No        Medication list has been reviewed and updated.   Physical Exam  Constitutional: She is oriented to person, place, and time. She appears well-developed. No distress.  HENT:  Head: Normocephalic and atraumatic.  Neck: Normal range of motion. Neck supple.  Cardiovascular: Normal rate, regular rhythm and normal heart sounds.   Pulmonary/Chest: Effort normal and breath sounds normal. No respiratory distress. She has no wheezes.  Musculoskeletal: She exhibits edema and tenderness.       Right knee: She exhibits decreased range of motion. She exhibits no effusion.       Left knee: She exhibits decreased range of motion. She exhibits no swelling.  Neurological: She is alert and oriented to person, place, and time.  Skin: Skin is warm and dry. No rash noted.  Psychiatric:  She has a normal mood and affect. Her behavior is normal. Thought content normal.  Nursing note and vitals reviewed.   BP 134/82   Pulse 74   Temp 98.4 F (36.9 C)   Ht 5\' 2"  (1.575 m)   Wt 147 lb (66.7 kg)   SpO2 97%   BMI 26.89 kg/m   Assessment and Plan: 1. Venous stasis dermatitis of left lower extremity elevate - Ambulatory referral to Wound Clinic  2. Cellulitis of left lower extremity Keflex - Ambulatory referral to Wound Clinic   Meds ordered this encounter  Medications  . cephALEXin (KEFLEX) 500 MG capsule    Sig: Take 1 capsule (500 mg total) by mouth 4 (four) times daily.    Dispense:  28 capsule    Refill:  0    Halina Maidens, MD [facility]  12/19/2016

## 2016-12-30 ENCOUNTER — Encounter: Payer: Medicare PPO | Attending: Physician Assistant | Admitting: Physician Assistant

## 2016-12-30 ENCOUNTER — Other Ambulatory Visit
Admission: RE | Admit: 2016-12-30 | Discharge: 2016-12-30 | Disposition: A | Discharge status: Home / Self Care | Payer: Medicare PPO | Source: Ambulatory Visit | Attending: Internal Medicine | Admitting: Internal Medicine

## 2016-12-30 DIAGNOSIS — Z7984 Long term (current) use of oral hypoglycemic drugs: Secondary | ICD-10-CM | POA: Insufficient documentation

## 2016-12-30 DIAGNOSIS — E11622 Type 2 diabetes mellitus with other skin ulcer: Secondary | ICD-10-CM | POA: Insufficient documentation

## 2016-12-30 DIAGNOSIS — L97911 Non-pressure chronic ulcer of unspecified part of right lower leg limited to breakdown of skin: Secondary | ICD-10-CM | POA: Insufficient documentation

## 2016-12-30 DIAGNOSIS — I10 Essential (primary) hypertension: Secondary | ICD-10-CM | POA: Insufficient documentation

## 2016-12-30 DIAGNOSIS — Z888 Allergy status to other drugs, medicaments and biological substances status: Secondary | ICD-10-CM | POA: Diagnosis not present

## 2016-12-30 DIAGNOSIS — Z87891 Personal history of nicotine dependence: Secondary | ICD-10-CM | POA: Diagnosis not present

## 2016-12-30 DIAGNOSIS — L03116 Cellulitis of left lower limb: Secondary | ICD-10-CM | POA: Diagnosis not present

## 2016-12-30 DIAGNOSIS — B999 Unspecified infectious disease: Secondary | ICD-10-CM | POA: Diagnosis present

## 2016-12-30 DIAGNOSIS — Z89412 Acquired absence of left great toe: Secondary | ICD-10-CM | POA: Insufficient documentation

## 2016-12-30 DIAGNOSIS — Z880 Allergy status to penicillin: Secondary | ICD-10-CM | POA: Insufficient documentation

## 2017-01-01 LAB — AEROBIC CULTURE  (SUPERFICIAL SPECIMEN): CULTURE: NO GROWTH

## 2017-01-01 LAB — AEROBIC CULTURE W GRAM STAIN (SUPERFICIAL SPECIMEN)

## 2017-01-01 NOTE — Progress Notes (Addendum)
WHITENACK, [name] ([id number]) Visit Report for 12/30/2016 Chief Complaint Document Details Patient Name: Laura [name], [name] Date of Service: 12/30/2016 10:30 AM Medical Record Number: [id number] Patient Account Number: [id number] Date of Birth/Sex: 1924-07-19 (83 y.o. Female) Treating RN: Ahmed Prima Primary Care Provider: Halina Maidens Other Clinician: Referring Provider: Halina Maidens Treating Provider/Extender: Melburn Hake, Giovana Faciane Weeks in Treatment: 0 Information Obtained from: Patient Chief Complaint Left lower leg cellulitis Electronic Signature(s) Signed: 01/02/2017 9:28:33 AM By: Worthy Keeler PA-C Entered By: Worthy Keeler on 01/02/2017 08:15:08 Caporale, Sharan F. ([id number]) -------------------------------------------------------------------------------- HPI Details Patient Name: Laura [name], [name] Date of Service: 12/30/2016 10:30 AM Medical Record Number: [id number] Patient Account Number: [id number] Date of Birth/Sex: [birth year] (83 y.o. Female) Treating RN: Carolyne Fiscal, Debi Primary Care Provider: Halina Maidens Other Clinician: Referring Provider: Halina Maidens Treating Provider/Extender: Melburn Hake, Jaquell Seddon Weeks in Treatment: 0 History of Present Illness HPI Description: 12/30/16 Patient presents today for initial evaluation concerning an area over her left lateral ankle which she tells me has been intermittent in nature since 2000. However the current opening has been present for about two weeks. She has been tolerating the dressing changes at home with antibiotic ointment but that is really the only treatment that has been initiated at this point. That is other than the oral antibiotics which was Keflex that patient was placed on by her primary care provider prior to being referred to [place]. She does have some discomfort but rates this to be around a 2-3 out of 10 fortunately the redness does not seem to be spreading to any other location as far as her lower extremity is concerned. She  does tell me that in the past antibiotics have been beneficial for her unfortunately the current antibiotics have not been of benefit and no wound culture was performed as of yet. She has previously seen Dr. 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 She does have diabetes and are most recent blood sugars have run between 104 and 106 according to patient's although her last hemoglobin A1c she is unaware of. Patient's current wound does not appear to be significantly open but is rather more of a weeping region of cellulitis. Electronic Signature(s) Signed: 01/02/2017 9:28:33 AM By: Worthy Keeler PA-C Entered By: Worthy Keeler on 01/02/2017 08:16:10 Minton, Nyazia F. ([id number]) -------------------------------------------------------------------------------- Physical Exam Details Patient Name: Laura [name], [name] Date of Service: 12/30/2016 10:30 AM Medical Record Number: [id number] Patient Account Number: [id number] Date of Birth/Sex: 11/24/1924 (83 y.o. Female) Treating RN: Ahmed Prima Primary Care Provider: Halina Maidens Other Clinician: Referring Provider: Halina Maidens Treating Provider/Extender: STONE III, Teona Vargus Weeks in Treatment: 0 Constitutional sitting or standing blood pressure is within target range for patient.. pulse regular and within target range for patient.Marland Kitchen respirations regular, non-labored and within target range for patient.Marland Kitchen temperature within target range for patient.. Well-nourished and well-hydrated in no acute distress. Eyes conjunctiva clear no eyelid edema noted. pupils equal round and reactive to light and accommodation. Ears, Nose, Mouth, and Throat no gross abnormality of ear auricles or external auditory canals. normal hearing noted during conversation. mucus membranes  moist. Respiratory normal breathing without difficulty. clear to auscultation bilaterally. Cardiovascular regular rate and rhythm with normal S1, S2. Faint posterior tibial and dorsalis pedis pulses bilateral lower extremities. no clubbing, cyanosis, significant edema, <3 sec cap refill. Gastrointestinal (  GI) soft, non-tender, non-distended, +BS. no ventral hernia noted. Musculoskeletal normal gait and posture. no significant deformity or arthritic changes, no loss or range of motion, no clubbing. 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 Psychiatric this patient is able to make decisions and demonstrates good insight into disease process. Alert and Oriented x 3. pleasant and cooperative. Notes On examination patient's wound does show signs of erythema with weeping in regard to the wound bed. Fortunately there is no significant. Discharge though she does have some mild discomfort. Electronic Signature(s) Signed: 01/02/2017 9:28:33 AM By: Worthy Keeler PA-C Entered By: Worthy Keeler on 01/02/2017 08:17:37 Mory, Chelcea F. ([id number]) -------------------------------------------------------------------------------- Physician Orders Details Patient Name: Harshfield, [name] Date of Service: 12/30/2016 10:30 AM Medical Record Number: [id number] Patient Account Number: [id number] Date of Birth/Sex: Apr 03, 1925 (83 y.o. Female) Treating RN: Carolyne Fiscal, Debi Primary Care Provider: Halina Maidens Other Clinician: Referring Provider: Halina Maidens Treating Provider/Extender: Melburn Hake, Aleathea Pugmire Weeks in Treatment: 0 Verbal / Phone Orders: Yes Clinician: Carolyne Fiscal, Debi Read Back and Verified: Yes Diagnosis Coding ICD-10 Coding Code Description L03.116 Cellulitis of left lower limb E11.622 Type 2 diabetes mellitus with other skin ulcer I10 Essential (primary) hypertension Wound Cleansing Wound #1 Left,Lateral Lower Leg o Clean wound with wound cleanser. o Cleanse wound with mild soap and  water o May Shower, gently pat wound dry prior to applying new dressing. Wound #2 Right Lower Leg o Clean wound with wound cleanser. o Cleanse wound with mild soap and water o May Shower, gently pat wound dry prior to applying new dressing. Anesthetic Wound #1 Left,Lateral Lower Leg o Topical Lidocaine 4% cream applied to wound bed prior to debridement Wound #2 Right Lower Leg o Topical Lidocaine 4% cream applied to wound bed prior to debridement Skin Barriers/Peri-Wound Care Wound #1 Left,Lateral Lower Leg o Skin Prep Primary Wound Dressing Wound #1 Left,Lateral Lower Leg o Gentamicin Sulfate Cream o Aquacel Ag Secondary Dressing Wound #1 Left,Lateral Lower Leg Cheyney, Ivis F. ([id number]) o Dry Gauze o Boardered Foam Dressing Dressing Change Frequency Wound #1 Left,Lateral Lower Leg o Change dressing every other day. Wound #2 Right Lower Leg o Change dressing every other day. Follow-up Appointments Wound #1 Left,Lateral Lower Leg o Return Appointment in 1 week. Wound #2 Right Lower Leg o Return Appointment in 1 week. Edema Control Wound #1 Left,Lateral Lower Leg o Elevate legs to the level of the heart and pump ankles as often as possible Wound #2 Right Lower Leg o Elevate legs to the level of the heart and pump ankles as often as possible Additional Orders / Instructions Wound #1 Left,Lateral Lower Leg o Increase protein intake. Wound #2 Right Lower Leg o Increase protein intake. Laboratory o Bacteria identified in Wound by Culture (MICRO) - (ICD10 L03.116 - Cellulitis of left lower limb) oooo LOINC Code: 2694-8 oooo Convenience Name: Wound culture routine Patient Medications Allergies: Augmentin, calcium channel blockers, nitrofurantoin, ACE Inhibitors, atorvastatin, Beta-Blockers (Beta-Adrenergic Blocking Agts), Levaquin, pravastatin, Statins-Hmg-Coa Reductase Inhibitors, sulfa, clindamycin, lincomycin Notifications  Medication Indication Start End gentamicin 12/30/2016 DOSE topical 0.1 % cream - cream topical applied to affected left lower leg wound every other day then cover with dressing Lindquist, David F. ([id number]) Notes I'm going to recommend that we continue with the above wound care orders for the next week. I did not prescribe any new medications as far as antibodies are concerned although depending on the results of the wound culture remains initiate another antibiotic although we have to be careful of  her many allergies to medications. If anything worsens in the interim she will contact our office for additional recommendations. Electronic Signature(s) Signed: 01/02/2017 9:28:33 AM By: Worthy Keeler PA-C Previous Signature: 12/30/2016 6:12:13 PM Version By: Worthy Keeler PA-C Previous Signature: 12/30/2016 4:14:11 PM Version By: Alric Quan Entered By: Worthy Keeler on 01/02/2017 08:19:31 Niemann, Lanyla F. ([id number]) -------------------------------------------------------------------------------- Problem List Details Patient Name: Laura [name], [name] Date of Service: 12/30/2016 10:30 AM Medical Record Number: [id number] Patient Account Number: [id number] Date of Birth/Sex: October 06, 1924 (83 y.o. Female) Treating RN: Carolyne Fiscal, Debi Primary Care Provider: Halina Maidens Other Clinician: Referring Provider: Halina Maidens Treating Provider/Extender: Melburn Hake, Lechelle Wrigley Weeks in Treatment: 0 Active Problems ICD-10 Encounter Code Description Active Date Diagnosis L03.116 Cellulitis of left lower limb 01/02/2017 Yes E11.622 Type 2 diabetes mellitus with other skin ulcer 01/02/2017 Yes I10 Essential (primary) hypertension 01/02/2017 Yes Inactive Problems Resolved Problems Electronic Signature(s) Signed: 01/02/2017 9:28:33 AM By: Worthy Keeler PA-C Entered By: Worthy Keeler on 01/02/2017 08:14:44 Tamas, Brighton F.  ([id number]) -------------------------------------------------------------------------------- Progress Note Details Patient Name: Laura [name], [name] Date of Service: 12/30/2016 10:30 AM Medical Record Number: [id number] Patient Account Number: [id number] Date of Birth/Sex: [birth year] (83 y.o. Female) Treating RN: Carolyne Fiscal, Debi Primary Care Provider: Halina Maidens Other Clinician: Referring Provider: Halina Maidens Treating Provider/Extender: Melburn Hake, Arul Farabee Weeks in Treatment: 0 Subjective Chief Complaint Information obtained from Patient Left lower leg cellulitis History of Present Illness (HPI) 12/30/16 Patient presents today for initial evaluation concerning an area over her left lateral ankle which she tells me has been intermittent in nature since 2000. However the current opening has been present for about two weeks. She has been tolerating the dressing changes at home with antibiotic ointment but that is really the only treatment that has been initiated at this point. That is other than the oral antibiotics which was Keflex that patient was placed on by her primary care provider prior to being referred to [place]. She does have some discomfort but rates this to be around a 2-3 out of 10 fortunately the redness does not seem to be spreading to any other location as far as her lower extremity is concerned. She does tell me that in the past antibiotics have been beneficial for her unfortunately the current antibiotics have not been of benefit and no wound culture was performed as of yet. She has previously seen Dr. 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 She does have diabetes and are most recent blood sugars have run between 104 and 106 according to patient's although her last hemoglobin A1c she is unaware of.  Patient's current wound does not appear to be significantly open but is rather more of a weeping region of cellulitis. Wound History Patient presents with 1 open wound that has been present for approximately 2 weeks. Patient has been treating wound in the following manner: clobetasol. The wound has been healed in the past but has re- opened. Laboratory tests have not been performed in the last month. Patient reportedly has not tested positive for an antibiotic resistant organism. Patient reportedly has tested positive for osteomyelitis. Patient reportedly has had testing performed to evaluate circulation in the legs. Patient experiences the following problems associated with their wounds: swelling. Patient History Information obtained from Patient. Allergies Augmentin, calcium channel blockers, nitrofurantoin, ACE Inhibitors, atorvastatin,  Beta-Blockers (Beta- Adrenergic Blocking Agts), Levaquin, pravastatin, Statins-Hmg-Coa Reductase Inhibitors, sulfa, clindamycin, lincomycin Bedrosian, Kleigh F. ([id number]) Family History Cancer - Siblings, Child, Maternal Grandparents, Diabetes - Mother, Siblings, Heart Disease - Father, Hypertension - Siblings, Stroke - Father, Mother, Thyroid Problems - Child, No family history of Hereditary Spherocytosis, Kidney Disease, Lung Disease, Seizures, Tuberculosis. Social History Former smoker - smoked late teens early 55s, Marital Status - Widowed, Alcohol Use - Never, Drug Use - No History, Caffeine Use - Daily. Medical History Endocrine Patient has history of Type II Diabetes Patient is treated with Oral Agents. Review of Systems (ROS) Constitutional Symptoms (General Health) The patient has no complaints or symptoms. Eyes Complains or has symptoms of Glasses / Contacts. Ear/Nose/Mouth/Throat The patient has no complaints or symptoms. Objective Constitutional sitting or standing blood pressure is within target range for patient.. pulse regular and  within target range for patient.Marland Kitchen respirations regular, non-labored and within target range for patient.Marland Kitchen temperature within target range for patient.. Well-nourished and well-hydrated in no acute distress. Vitals Time Taken: 10:54 AM, Height: 63 in, Source: Stated, Weight: 164.8 lbs, Source: Measured, BMI: 29.2, Temperature: 97.6 F, Pulse: 66 bpm, Respiratory Rate: 18 breaths/min, Blood Pressure: 130/84 mmHg. Eyes conjunctiva clear no eyelid edema noted. pupils equal round and reactive to light and accommodation. Ears, Nose, Mouth, and Throat no gross abnormality of ear auricles or external auditory canals. normal hearing noted during conversation. mucus membranes moist. Fadeley, Shahd F. ([id number]) Respiratory normal breathing without difficulty. clear to auscultation bilaterally. Cardiovascular regular rate and rhythm with normal S1, S2. Faint posterior tibial and dorsalis pedis pulses bilateral lower extremities. no clubbing, cyanosis, significant edema, Gastrointestinal (GI) soft, non-tender, non-distended, +BS. no ventral hernia noted. Musculoskeletal normal gait and posture. no significant deformity or arthritic changes, no loss or range of motion, no clubbing. 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 Psychiatric this patient is able to make decisions and demonstrates good insight into disease process. Alert and Oriented x 3. pleasant and cooperative. General Notes: On examination patient's wound does show signs of erythema with weeping in regard to the wound bed. Fortunately there is no significant. Discharge though she does have some mild discomfort. Integumentary (Hair, Skin) Wound #1 status is Open. Original cause of wound was Gradually Appeared. The wound is located on the Left,Lateral Lower Leg. The wound measures 2.5cm length x 0.5cm width x 0.1cm depth; 0.982cm^2 area and 0.098cm^3 volume. There is no tunneling noted. There is a large amount of serous drainage  noted. The wound margin is flat and intact. There is large (67-100%) pink granulation within the wound bed. There is a small (1-33%) amount of necrotic tissue within the wound bed including Adherent Slough. The periwound skin appearance exhibited: Hemosiderin Staining, Erythema. The surrounding wound skin color is noted with erythema which is circumferential. Periwound temperature was noted as No Abnormality. Wound #2 status is Open. Original cause of wound was Gradually Appeared. The wound is located on the Right Lower Leg. The wound measures 1cm length x 0.5cm width x 0.1cm depth; 0.393cm^2 area and 0.039cm^3 volume. There is no tunneling or undermining noted. There is a small amount of serous drainage noted. The wound margin is flat and intact. There is large (67-100%) red granulation within the wound bed. There is no necrotic tissue within the wound bed. Periwound temperature was noted as No Abnormality. The periwound has tenderness on palpation. Assessment Active Problems ICD-10 L03.116 - Cellulitis of left lower limb E11.622 - Type 2 diabetes mellitus with other  skin ulcer I10 - Essential (primary) hypertension Wlodarczyk, Dayra F. ([id number]) Plan Wound Cleansing: Wound #1 Left,Lateral Lower Leg: Clean wound with wound cleanser. Cleanse wound with mild soap and water May Shower, gently pat wound dry prior to applying new dressing. Wound #2 Right Lower Leg: Clean wound with wound cleanser. Cleanse wound with mild soap and water May Shower, gently pat wound dry prior to applying new dressing. Anesthetic: Wound #1 Left,Lateral Lower Leg: Topical Lidocaine 4% cream applied to wound bed prior to debridement Wound #2 Right Lower Leg: Topical Lidocaine 4% cream applied to wound bed prior to debridement Skin Barriers/Peri-Wound Care: Wound #1 Left,Lateral Lower Leg: Skin Prep Primary Wound Dressing: Wound #1 Left,Lateral Lower Leg: Gentamicin Sulfate Cream Aquacel Ag Secondary  Dressing: Wound #1 Left,Lateral Lower Leg: Dry Gauze Boardered Foam Dressing Dressing Change Frequency: Wound #1 Left,Lateral Lower Leg: Change dressing every other day. Wound #2 Right Lower Leg: Change dressing every other day. Follow-up Appointments: Wound #1 Left,Lateral Lower Leg: Return Appointment in 1 week. Wound #2 Right Lower Leg: Return Appointment in 1 week. Edema Control: Wound #1 Left,Lateral Lower Leg: Elevate legs to the level of the heart and pump ankles as often as possible Wound #2 Right Lower Leg: Elevate legs to the level of the heart and pump ankles as often as possible Additional Orders / Instructions: Wound #1 Left,Lateral Lower Leg: Increase protein intake. Wound #2 Right Lower Leg: Increase protein intake. Mecca, Levora F. ([id number]) Laboratory ordered were: Wound culture routine The following medication(s) was prescribed: gentamicin topical 0.1 % cream cream topical applied to affected left lower leg wound every other day then cover with dressing starting 12/30/2016 General Notes: I'm going to recommend that we continue with the above wound care orders for the next week. I did not prescribe any new medications as far as antibodies are concerned although depending on the results of the wound culture remains initiate another antibiotic although we have to be careful of her many allergies to medications. If anything worsens in the interim she will contact our office for additional recommendations. Electronic Signature(s) Signed: 01/02/2017 9:28:33 AM By: Worthy Keeler PA-C Entered By: Worthy Keeler on 01/02/2017 08:19:40 Gallop, Apryl F. ([id number]) -------------------------------------------------------------------------------- ROS/PFSH Details Patient Name: Laura [name], [name] Date of Service: 12/30/2016 10:30 AM Medical Record Number: [id number] Patient Account Number: [id number] Date of Birth/Sex: [birth year] (83 y.o. Female) Treating RN: Carolyne Fiscal,  Debi Primary Care Provider: Halina Maidens Other Clinician: Referring Provider: Halina Maidens Treating Provider/Extender: Melburn Hake, Jeniffer Culliver Weeks in Treatment: 0 Information Obtained From Patient Wound History Do you currently have one or more open woundso Yes How many open wounds do you currently haveo 1 Approximately how long have you had your woundso 2 weeks How have you been treating your wound(s) until nowo clobetasol Has your wound(s) ever healed and then re-openedo Yes Have you had any lab work done in the past montho No Have you tested positive for an antibiotic resistant organism (MRSA, VRE)o No Have you tested positive for osteomyelitis (bone infection)o Yes Have you had any tests for circulation on your legso Yes [facility] the test [facility] Have you had other problems associated with your woundso Swelling Eyes Complaints and Symptoms: Positive for: Glasses / Contacts Constitutional Symptoms (General Health) Complaints and Symptoms: No Complaints or Symptoms Ear/Nose/Mouth/Throat Complaints and Symptoms: No Complaints or Symptoms Endocrine Medical History: Positive for: Type II Diabetes Time with diabetes: 2003 Treated with: Oral agents Immunizations Pneumococcal Vaccine: Received Pneumococcal Vaccination: Yes Laura [name], [name] (  [id number]) Family and Social History Cancer: Yes - Siblings, Child, Maternal Grandparents; Diabetes: Yes - Mother, Siblings; Heart Disease: Yes - Father; Hereditary Spherocytosis: No; Hypertension: Yes - Siblings; Kidney Disease: No; Lung Disease: No; Seizures: No; Stroke: Yes - Father, Mother; Thyroid Problems: Yes - Child; Tuberculosis: No; Former smoker - smoked late teens early 3s; Marital Status - Widowed; Alcohol Use: Never; Drug Use: No History; Caffeine Use: Daily; Financial Concerns: No; Food, Clothing or Shelter Needs: No; Support System Lacking: No; Transportation Concerns: No; Advanced Directives: No; Patient does not  want information on Advanced Directives; Do not resuscitate: No; Living Will: Yes (Not Provided); Medical Power of Attorney: Yes (Not Provided) Electronic Signature(s) Signed: 12/30/2016 4:14:11 PM By: Alric Quan Signed: 12/30/2016 6:13:19 PM By: Worthy Keeler PA-C Entered By: Alric Quan on 12/30/2016 11:11:28 Stabler, Niang F. ([id number]) -------------------------------------------------------------------------------- SuperBill Details Patient Name: Laura [name], [name] Date of Service: 12/30/2016 Medical Record Number: [id number] Patient Account Number: [id number] Date of Birth/Sex: June 27, 1924 (83 y.o. Female) Treating RN: Carolyne Fiscal, Debi Primary Care Provider: Halina Maidens Other Clinician: Referring Provider: Halina Maidens Treating Provider/Extender: Melburn Hake, Darothy Courtright Weeks in Treatment: 0 Diagnosis Coding ICD-10 Codes Code Description L03.116 Cellulitis of left lower limb E11.622 Type 2 diabetes mellitus with other skin ulcer I10 Essential (primary) hypertension Facility Procedures CPT4 Code: [id number] Description: [id number] - WOUND CARE VISIT-LEV 5 EST PT Modifier: Quantity: 1 Physician Procedures CPT4 Code: [id number] Description: WC PHYS LEVEL 3 o NEW PT ICD-10 Description Diagnosis L03.116 Cellulitis of left lower limb E11.622 Type 2 diabetes mellitus with other skin ulc I10 Essential (primary) hypertension Modifier: er Quantity: 1 Electronic Signature(s) Signed: 01/02/2017 9:28:33 AM By: Worthy Keeler PA-C Entered By: Worthy Keeler on 01/02/2017 08:20:00

## 2017-01-01 NOTE — Progress Notes (Signed)
Camps, Taniya F. ([id number]) Visit Report for 12/30/2016 Abuse/Suicide Risk Screen Details Patient Name: GHEEN, Onesha F. Date of Service: 12/30/2016 10:30 AM Medical Record Number: [id number] Patient Account Number: [id number] Date of Birth/Sex: 19-Oct-1924 (83 y.o. Female) Treating RN: Carolyne Fiscal, Debi Primary Care Garron Eline: Halina Maidens Other Clinician: Referring Levin Dagostino: Halina Maidens Treating Harbert Fitterer/Extender: Melburn Hake, HOYT Weeks in Treatment: 0 Abuse/Suicide Risk Screen Items Answer ABUSE/SUICIDE RISK SCREEN: Has anyone close to you tried to hurt or harm you recentlyo No Do you feel uncomfortable with anyone in your familyo No Has anyone forced you do things that you didnot want to doo No Do you have any thoughts of harming yourselfo No Patient displays signs or symptoms of abuse and/or neglect. No Electronic Signature(s) Signed: 12/30/2016 4:14:11 PM By: Alric Quan Entered By: Alric Quan on 12/30/2016 11:11:35 Berg, Rainee F. ([id number]) -------------------------------------------------------------------------------- Activities of Daily Living Details Patient Name: Hamstra, Amiyah F. Date of Service: 12/30/2016 10:30 AM Medical Record Number: [id number] Patient Account Number: [id number] Date of Birth/Sex: 11/09/1924 (83 y.o. Female) Treating RN: Carolyne Fiscal, Debi Primary Care Danel Requena: Halina Maidens Other Clinician: Referring Aaren Atallah: Halina Maidens Treating Dezaree Tracey/Extender: Melburn Hake, HOYT Weeks in Treatment: 0 Activities of Daily Living Items Answer Activities of Daily Living (Please select one for each item) Drive Automobile Completely Able Take Medications Completely Able Use Telephone Completely Able Care for Appearance Completely Able Use Toilet Completely Able Bath / Shower Completely Able Dress Self Completely Able Feed Self Completely Able Walk Completely Able Get In / Out Bed Completely Able Housework Completely Able Prepare Meals Completely  Able Handle Money Completely Able Shop for Self Completely Able Electronic Signature(s) Signed: 12/30/2016 4:14:11 PM By: Alric Quan Entered By: Alric Quan on 12/30/2016 11:11:56 Loney, Debby F. ([id number]) -------------------------------------------------------------------------------- Education Assessment Details Patient Name: Sivils, Lyndsy F. Date of Service: 12/30/2016 10:30 AM Medical Record Number: [id number] Patient Account Number: [id number] Date of Birth/Sex: July 24, 1924 (83 y.o. Female) Treating RN: Carolyne Fiscal, Debi Primary Care Ayce Pietrzyk: Halina Maidens Other Clinician: Referring Kamerin Grumbine: Halina Maidens Treating Calob Baskette/Extender: Melburn Hake, HOYT Weeks in Treatment: 0 Primary Learner Assessed: Patient Learning Preferences/Education Level/Primary Language Learning Preference: Explanation, Printed Material Highest Education Level: High School Preferred Language: English Cognitive Barrier Assessment/Beliefs Language Barrier: No Translator Needed: No Memory Deficit: No Emotional Barrier: No Cultural/Religious Beliefs Affecting Medical No Care: Physical Barrier Assessment Impaired Vision: Yes Glasses Impaired Hearing: Yes HOH Decreased Hand dexterity: No Knowledge/Comprehension Assessment Knowledge Level: Medium Comprehension Level: Medium Ability to understand written Medium instructions: Ability to understand verbal Medium instructions: Motivation Assessment Anxiety Level: Calm Cooperation: Cooperative Education Importance: Acknowledges Need Interest in Health Problems: Asks Questions Perception: Coherent Willingness to Engage in Self- Medium Management Activities: Readiness to Engage in Self- Medium Management Activities: Electronic Signature(s) Kaleva, Aysiah F. ([id number]) Signed: 12/30/2016 4:14:11 PM By: Alric Quan Entered By: Alric Quan on 12/30/2016 11:12:24 Bogden, Nysa F.  ([id number]) -------------------------------------------------------------------------------- Fall Risk Assessment Details Patient Name: Griner, Lea F. Date of Service: 12/30/2016 10:30 AM Medical Record Number: [id number] Patient Account Number: [id number] Date of Birth/Sex: [birth year] (83 y.o. Female) Treating RN: Carolyne Fiscal, Debi Primary Care Lemario Chaikin: Halina Maidens Other Clinician: Referring Lien Lyman: Halina Maidens Treating Kaylana Fenstermacher/Extender: Melburn Hake, HOYT Weeks in Treatment: 0 Fall Risk Assessment Items Have you had 2 or more falls in the last 12 monthso 0 No Have you had any fall that resulted in injury in the last 12 monthso 0 Yes FALL RISK ASSESSMENT: History of falling - immediate or within 3 months 25 Yes Secondary diagnosis 15 Yes Ambulatory aid None/bed rest/wheelchair/nurse  0 No Crutches/cane/walker 0 No Furniture 0 No IV Access/Saline Lock 0 No Gait/Training Normal/bed rest/immobile 0 No Weak 0 No Impaired 0 No Mental Status Oriented to own ability 0 Yes Electronic Signature(s) Signed: 12/30/2016 4:14:11 PM By: Alric Quan Entered By: Alric Quan on 12/30/2016 11:13:40 Pangallo, Omunique F. ([id number]) -------------------------------------------------------------------------------- Nutrition Risk Assessment Details Patient Name: Cutbirth, Shanikwa F. Date of Service: 12/30/2016 10:30 AM Medical Record Number: [id number] Patient Account Number: [id number] Date of Birth/Sex: August 20, 1924 (83 y.o. Female) Treating RN: Carolyne Fiscal, Debi Primary Care Kolby Myung: Halina Maidens Other Clinician: Referring Jolynne Spurgin: Halina Maidens Treating Tamecka Milham/Extender: Joaquim Lai III, HOYT Weeks in Treatment: 0 Height (in): 63 Weight (lbs): 164.8 Body Mass Index (BMI): 29.2 Nutrition Risk Assessment Items NUTRITION RISK SCREEN: I have an illness or condition that made me change the kind and/or 2 Yes amount of food I eat I eat fewer than two meals per day 0 No I eat few fruits and  vegetables, or milk products 0 No I have three or more drinks of beer, liquor or wine almost every day 0 No I have tooth or mouth problems that make it hard for me to eat 0 No I don't always have enough money to buy the food I need 0 No I eat alone most of the time 0 No I take three or more different prescribed or over-the-counter drugs a 1 Yes day Without wanting to, I have lost or gained 10 pounds in the last six 0 No months I am not always physically able to shop, cook and/or feed myself 0 No Nutrition Protocols Good Risk Protocol Moderate Risk Protocol Electronic Signature(s) Signed: 12/30/2016 4:14:11 PM By: Alric Quan Entered By: Alric Quan on 12/30/2016 11:13:49

## 2017-01-02 NOTE — Progress Notes (Addendum)
KROEGER, Manahil F. ([id number]) Visit Report for 12/30/2016 Allergy List Details Patient Name: Laura [name], [name] Date of Service: 12/30/2016 10:30 AM Medical Record Number: [id number] Patient Account Number: [id number] Date of Birth/Sex: 13-Aug-1924 (83 y.o. Female) Treating RN: Ahmed Prima Primary Care Amar Keenum: Halina Maidens Other Clinician: Referring Wilburn Keir: Halina Maidens Treating Ezri Fanguy/Extender: Joaquim Lai III, HOYT Weeks in Treatment: 0 Allergies Active Allergies Augmentin calcium channel blockers nitrofurantoin ACE Inhibitors atorvastatin Beta-Blockers (Beta-Adrenergic Blocking Agts) Levaquin pravastatin Statins-Hmg-Coa Reductase Inhibitors sulfa clindamycin lincomycin Allergy Notes Electronic Signature(s) Signed: 12/30/2016 4:14:11 PM By: Alric Quan Entered By: Alric Quan on 12/30/2016 10:59:49 Haver, Felipe F. ([id number]) -------------------------------------------------------------------------------- Arrival Information Details Patient Name: Dezeeuw, Ginia F. Date of Service: 12/30/2016 10:30 AM Medical Record Number: [id number] Patient Account Number: [id number] Date of Birth/Sex: June 18, 1925 (83 y.o. Female) Treating RN: Carolyne Fiscal, Debi Primary Care Taeja Debellis: Halina Maidens Other Clinician: Referring Imaya Duffy: Halina Maidens Treating Levone Otten/Extender: Melburn Hake, HOYT Weeks in Treatment: 0 Visit Information Patient Arrived: Kasandra Knudsen Arrival Time: 10:49 Accompanied By: friend Transfer Assistance: EasyPivot Patient Lift Patient Identification Verified: Yes Secondary Verification Process Yes Completed: Patient Requires Transmission- No Based Precautions: Patient Has Alerts: Yes Patient Alerts: DM II Electronic Signature(s) Signed: 12/30/2016 4:14:11 PM By: Alric Quan Entered By: Alric Quan on 12/30/2016 10:54:01 Yacoub, Psalm F. ([id number]) -------------------------------------------------------------------------------- Clinic Level of Care Assessment  Details Patient Name: Schubert, Twylah F. Date of Service: 12/30/2016 10:30 AM Medical Record Number: [id number] Patient Account Number: [id number] Date of Birth/Sex: [birth year] (83 y.o. Female) Treating RN: Carolyne Fiscal, Debi Primary Care Reynolds Kittel: Halina Maidens Other Clinician: Referring Lallie Strahm: Halina Maidens Treating Torian Quintero/Extender: Melburn Hake, HOYT Weeks in Treatment: 0 Clinic Level of Care Assessment Items TOOL 2 Quantity Score X - Use when only an EandM is performed on the INITIAL visit 1 0 ASSESSMENTS - Nursing Assessment / Reassessment X - General Physical Exam (combine w/ comprehensive assessment (listed just 1 20 below) when performed on new pt. evals) X - Comprehensive Assessment (HX, ROS, Risk Assessments, Wounds Hx, etc.) 1 25 ASSESSMENTS - Wound and Skin Assessment / Reassessment []  - Simple Wound Assessment / Reassessment - one wound 0 X - Complex Wound Assessment / Reassessment - multiple wounds 2 5 []  - Dermatologic / Skin Assessment (not related to wound area) 0 ASSESSMENTS - Ostomy and/or Continence Assessment and Care []  - Incontinence Assessment and Management 0 []  - Ostomy Care Assessment and Management (repouching, etc.) 0 PROCESS - Coordination of Care []  - Simple Patient / Family Education for ongoing care 0 X - Complex (extensive) Patient / Family Education for ongoing care 1 20 X - Staff obtains Programmer, systems, Records, Test Results / Process Orders 1 10 []  - Staff telephones HHA, Nursing Homes / Clarify orders / etc 0 []  - Routine Transfer to another Facility (non-emergent condition) 0 []  - Routine Hospital Admission (non-emergent condition) 0 []  - New Admissions / Biomedical engineer / Ordering NPWT, Apligraf, etc. 0 []  - Emergency Hospital Admission (emergent condition) 0 X - Simple Discharge Coordination 1 10 Behrend, Faithann F. ([id number]) []  - Complex (extensive) Discharge Coordination 0 PROCESS - Special Needs []  - Pediatric / Minor Patient Management 0 []   - Isolation Patient Management 0 []  - Hearing / Language / Visual special needs 0 []  - Assessment of Community assistance (transportation, D/C planning, etc.) 0 []  - Additional assistance / Altered mentation 0 []  - Support Surface(s) Assessment (bed, cushion, seat, etc.) 0 INTERVENTIONS - Wound Cleansing / Measurement X - Wound Imaging (photographs - any number of wounds) 1 5 []  - Wound  Tracing (instead of photographs) 0 []  - Simple Wound Measurement - one wound 0 X - Complex Wound Measurement - multiple wounds 2 5 []  - Simple Wound Cleansing - one wound 0 X - Complex Wound Cleansing - multiple wounds 2 5 INTERVENTIONS - Wound Dressings X - Small Wound Dressing one or multiple wounds 2 10 []  - Medium Wound Dressing one or multiple wounds 0 []  - Large Wound Dressing one or multiple wounds 0 []  - Application of Medications - injection 0 INTERVENTIONS - Miscellaneous []  - External ear exam 0 []  - Specimen Collection (cultures, biopsies, blood, body fluids, etc.) 0 X - Specimen(s) / Culture(s) sent or taken to Lab for analysis 1 5 []  - Patient Transfer (multiple staff / Civil Service fast streamer / Similar devices) 0 []  - Simple Staple / Suture removal (25 or less) 0 []  - Complex Staple / Suture removal (26 or more) 0 [place]era, Lashaya F. ([id number]) []  - Hypo / Hyperglycemic Management (close monitor of Blood Glucose) 0 X - Ankle / Brachial Index (ABI) - do not check if billed separately 1 15 Has the patient been seen at the hospital within the last three years: Yes Total Score: 160 Level Of Care: New/Established - Level 5 Electronic Signature(s) Signed: 12/30/2016 4:14:11 PM By: Alric Quan Entered By: Alric Quan on 12/30/2016 14:06:06 Mcgroarty, Maysoon F. ([id number]) -------------------------------------------------------------------------------- Encounter Discharge Information Details Patient Name: Margerum, Lilymae F. Date of Service: 12/30/2016 10:30 AM Medical Record Number: [id number] Patient Account  Number: [id number] Date of Birth/Sex: Jul 20, 1924 (83 y.o. Female) Treating RN: Carolyne Fiscal, Debi Primary Care Tyria Springer: Halina Maidens Other Clinician: Referring Roniesha Hollingshead: Halina Maidens Treating Larz Mark/Extender: Melburn Hake, HOYT Weeks in Treatment: 0 Encounter Discharge Information Items Discharge Pain Level: 0 Discharge Condition: Stable Ambulatory Status: Walker Discharge Destination: Home Transportation: Private Auto Accompanied By: friend Schedule Follow-up Appointment: Yes Medication Reconciliation completed No and provided to Patient/Care Kanon Novosel: Provided on Clinical Summary of Care: 12/30/2016 Form Type Recipient Paper Patient [facility] Electronic Signature(s) Signed: 12/30/2016 12:12:37 PM By: Ruthine Dose Entered By: Ruthine Dose on 12/30/2016 12:12:36 Golob, Jarielys F. ([id number]) -------------------------------------------------------------------------------- Lower Extremity Assessment Details Patient Name: Pattison, Trenisha F. Date of Service: 12/30/2016 10:30 AM Medical Record Number: [id number] Patient Account Number: [id number] Date of Birth/Sex: [birth year] (83 y.o. Female) Treating RN: Carolyne Fiscal, Debi Primary Care Allyanna Appleman: Halina Maidens Other Clinician: Referring Aerianna Losey: Halina Maidens Treating Kaveri Perras/Extender: Melburn Hake, HOYT Weeks in Treatment: 0 Vascular Assessment Pulses: Dorsalis Pedis Palpable: [Left:Yes] [Right:Yes] Doppler Audible: [Left:Yes] [Right:Yes] Posterior Tibial Palpable: [Left:No] [Right:No] Doppler Audible: [Left:Yes] [Right:Yes] Extremity colors, hair growth, and conditions: Extremity Color: [Left:Normal] [Right:Normal] Temperature of Extremity: [Left:Warm] [Right:Warm] Capillary Refill: [Left:> 3 seconds] [Right:> 3 seconds] Blood Pressure: Brachial: [Left:128] [Right:130] Dorsalis Pedis: 68 [Left:Dorsalis Pedis: 110] Ankle: Posterior Tibial: 70 [Left:Posterior Tibial: 100 0.54] [Right:0.85] Toe Nail Assessment Left: Right: Thick:  No Yes Discolored: No Yes Deformed: No No Improper Length and Hygiene: No No Electronic Signature(s) Signed: 12/30/2016 4:14:11 PM By: Alric Quan Entered By: Alric Quan on 12/30/2016 11:35:56 Golz, Tamar F. ([id number]) -------------------------------------------------------------------------------- Multi Wound Chart Details Patient Name: Mian, Marin F. Date of Service: 12/30/2016 10:30 AM Medical Record Number: [id number] Patient Account Number: [id number] Date of Birth/Sex: [birth year] (83 y.o. 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] [2:the Lower Extremity] [N/A:N/A] Comorbid History: [1:Type II Diabetes] [2:Type II Diabetes] [N/A:N/A] Date Acquired: [1:12/16/2016] [2:12/16/2016] [N/A:N/A] Weeks of Treatment: [1:0] [2:0] [N/A:N/A] Wound Status: [1:Open] [2:Open] [N/A:N/A] Measurements L x W x D 2.5x0.5x0.1 [2:1x0.5x0.1] [N/A:N/A] (cm) Area (cm) : [1:0.982] [2:0.393] [N/A:N/A] Volume (cm) : [1:0.098] [2:0.039] [N/A:N/A] Classification: [1:Grade 1] [2:Grade 1] [N/A:N/A] Exudate Amount: [1:Large] [2:Small] [N/A:N/A] Exudate Type: [1:Serous] [2:Serous] [N/A:N/A] Exudate Color: [1:amber] [2:amber] [N/A:N/A] Wound Margin: [1:Flat and Intact] [2:Flat and Intact] [N/A:N/A] Granulation Amount: [1:Large (67-100%)] [2:Large (67-100%)] [N/A:N/A] Granulation Quality: [1:Pink]  [2:Red] [N/A:N/A] Necrotic Amount: [1:Small (1-33%)] [2:None Present (0%)] [N/A:N/A] Epithelialization: [1:None] [2:None] [N/A:N/A] Periwound Skin Texture: No Abnormalities Noted No Abnormalities Noted [N/A:N/A] Periwound Skin [1:No Abnormalities Noted No Abnormalities Noted] [N/A:N/A] Moisture: Periwound Skin Color: Erythema: Yes [1:Hemosiderin Staining: Yes] [2:No Abnormalities Noted] [N/A:N/A] Erythema Location: [1:Circumferential] [2:N/A] [N/A:N/A] Temperature: [1:No Abnormality] [2:No Abnormality] [N/A:N/A] Tenderness on No Yes N/A Palpation: Wound Preparation: Ulcer Cleansing: Ulcer Cleansing: N/A Rinsed/Irrigated with Rinsed/Irrigated with Saline Saline Topical Anesthetic Topical Anesthetic Applied: Other: lidocaine Applied: Other: lidocaine 4% 4% Treatment Notes Electronic Signature(s) Signed: 12/30/2016 4:14:11 PM By: Alric Quan Entered By: Alric Quan on 12/30/2016 11:49:55 Robers, Lashai F. ([id number]) -------------------------------------------------------------------------------- Multi-Disciplinary Care Plan Details Patient Name: [name], [place] F. Date of Service: 12/30/2016 10:30 AM Medical Record Number: [id number] Patient Account Number: [id number] Date of Birth/Sex: 09-21-1924 (83 y.o. Female) Treating RN: Carolyne Fiscal, Debi Primary Care Pheobe Sandiford: Halina Maidens Other Clinician: Referring Haidee Stogsdill: Halina Maidens Treating Layton Tappan/Extender: Melburn Hake, HOYT Weeks in Treatment: 0 Active Inactive ` Abuse / Safety / Falls / Self Care Management Nursing Diagnoses: Potential for falls Goals: Patient will not experience any injury related to falls Date Initiated: 12/30/2016 Target Resolution Date: 04/29/2017 Goal Status: Active Interventions: Assess Activities of Daily Living upon admission and as needed Assess: immobility, friction, shearing, incontinence upon admission and as needed Notes: ` Nutrition Nursing Diagnoses: Imbalanced nutrition Impaired  glucose control: actual or potential Potential for alteratiion in Nutrition/Potential for imbalanced nutrition Goals: Patient/caregiver will maintain therapeutic glucose control Date Initiated: 12/30/2016 Target Resolution Date: 04/29/2017 Goal Status: Active Interventions: Assess patient nutrition upon admission and as needed per policy Notes: ` Orientation to the Kilmichael, [name] ([id number]) Nursing Diagnoses: Knowledge deficit related to the wound healing center program Goals: Patient/caregiver will verbalize understanding of the [place] Date Initiated: 12/30/2016 Target Resolution Date: 01/21/2017 Goal Status: Active Interventions: Provide education on orientation to the wound center Notes: ` Pain, Acute or Chronic Nursing Diagnoses: Pain, acute or chronic: actual or potential Potential alteration in comfort, pain Goals: Patient/caregiver will verbalize adequate pain control between visits Date Initiated: 12/30/2016 Target Resolution Date: 04/29/2017 Goal Status: Active Interventions: Complete pain assessment as per visit requirements Notes: ` Wound/Skin Impairment Nursing Diagnoses: Impaired tissue integrity Knowledge deficit related to smoking impact on wound healing Knowledge deficit related to ulceration/compromised skin integrity Goals: Ulcer/skin breakdown will have a volume reduction of 80% by week 12 Date Initiated: 12/30/2016 Target Resolution Date: 04/22/2017 Goal Status: Active Interventions: Assess patient/caregiver ability to perform ulcer/skin care regimen upon admission and as needed Notes: CHERELL, COLVIN ([id number]) Electronic Signature(s) Signed: 12/30/2016 4:14:11 PM By: Alric Quan Entered By: Alric Quan on 12/30/2016 11:49:39 Canlas, Reylynn F. ([id number]) -------------------------------------------------------------------------------- Pain Assessment Details Patient Name: Fullman, Jeneen F. Date of Service:  12/30/2016 10:30 AM Medical Record Number: [id number] Patient  Account Number: [id number] Date of Birth/Sex: Jul 16, 1924 (83 y.o. Female) Treating RN: Carolyne Fiscal, Debi Primary Care Johnnae Impastato: Halina Maidens Other Clinician: Referring Marieta Markov: Halina Maidens Treating Laquinda Moller/Extender: Melburn Hake, HOYT Weeks in Treatment: 0 Active Problems Location of Pain Severity and Description of Pain Patient Has Paino No Site Locations With Dressing Change: No Pain Management and Medication Current Pain Management: Electronic Signature(s) Signed: 12/30/2016 4:14:11 PM By: Alric Quan Entered By: Alric Quan on 12/30/2016 10:54:34 Salata, Teresina F. ([id number]) -------------------------------------------------------------------------------- Patient/Caregiver Education Details Patient Name: [name], Brittannie F. Date of Service: 12/30/2016 10:30 AM Medical Record Number: [id number] Patient Account Number: [id number] Date of Birth/Gender: 08/27/1924 (83 y.o. Female) Treating RN: Ahmed Prima Primary Care Physician: Halina Maidens Other Clinician: Referring Physician: Halina Maidens Treating Physician/Extender: Melburn Hake, HOYT Weeks in Treatment: 0 Education Assessment Education Provided To: Patient Education Topics Provided Welcome To [facility]: Handouts: Welcome To [facility] [place] Methods: Explain/Verbal Responses: State content correctly Wound/Skin Impairment: Handouts: Other: change dressing as ordered Methods: Demonstration, Explain/Verbal Responses: State content correctly Electronic Signature(s) Signed: 12/30/2016 4:14:11 PM By: Alric Quan Entered By: Alric Quan on 12/30/2016 11:51:06 Hearns, Phinley F. ([id number]) -------------------------------------------------------------------------------- Wound Assessment Details Patient Name: Sperling, Lydie F. Date of Service: 12/30/2016 10:30 AM Medical Record Number: [id number] Patient Account Number: [id number] Date of  Birth/Sex: 07-25-1924 (83 y.o. Female) Treating RN: Carolyne Fiscal, Debi Primary Care Laikynn Pollio: Halina Maidens Other Clinician: Referring Waynette Towers: Halina Maidens Treating Orena Cavazos/Extender: Joaquim Lai III, HOYT Weeks in Treatment: 0 Wound Status Wound Number: 1 Primary Diabetic Wound/Ulcer of the Lower Etiology: Extremity Wound Location: Left, Lateral Lower Leg Wound Status: Open Wounding Event: Gradually Appeared Comorbid Type II Diabetes Date Acquired: 12/16/2016 History: Weeks Of Treatment: 0 Clustered Wound: No Photos Photo Uploaded By: Alric Quan on 12/30/2016 14:09:06 Wound Measurements Length: (cm) 2.5 Width: (cm) 0.5 Depth: (cm) 0.1 Area: (cm) 0.982 Volume: (cm) 0.098 % Reduction in Area: % Reduction in Volume: Epithelialization: None Tunneling: No Wound Description Classification: Grade 1 Wound Margin: Flat and Intact Exudate Amount: Large Exudate Type: Serous Exudate Color: amber Foul Odor After Cleansing: No Slough/Fibrino No Wound Bed Granulation Amount: Large (67-100%) Granulation Quality: Pink Necrotic Amount: Small (1-33%) Necrotic Quality: Adherent Slough Dileo, Annebelle F. ([id number]) Periwound Skin Texture Texture Color No Abnormalities Noted: No No Abnormalities Noted: No Erythema: Yes Moisture Erythema Location: Circumferential No Abnormalities Noted: No Hemosiderin Staining: Yes Temperature / Pain Temperature: No Abnormality Wound Preparation Ulcer Cleansing: Rinsed/Irrigated with Saline Topical Anesthetic Applied: Other: lidocaine 4%, Treatment Notes Wound #1 (Left, Lateral Lower Leg) 1. Cleansed with: Clean wound with Normal Saline 2. Anesthetic Topical Lidocaine 4% cream to wound bed prior to debridement 4. Dressing Applied: Prisma Ag Other dressing (specify in notes) 5. Secondary Dressing Applied Bordered Foam Dressing Dry Gauze Notes gentamycin Electronic Signature(s) Signed: 12/30/2016 4:14:11 PM By: Alric Quan Entered By: Alric Quan on 12/30/2016 11:37:09 Mainwaring, Delesia F. ([id number]) -------------------------------------------------------------------------------- Wound Assessment Details Patient Name: Mifsud, Arnika F. Date of Service: 12/30/2016 10:30 AM Medical Record Number: [id number] Patient Account Number: [id number] Date of Birth/Sex: March 08, 1925 (83 y.o. Female) Treating RN: Carolyne Fiscal, Debi Primary Care Brian Kocourek: Halina Maidens Other Clinician: Referring Hersel Mcmeen: Halina Maidens Treating Michelena Culmer/Extender: STONE III, HOYT Weeks in Treatment: 0 Wound Status Wound Number: 2 Primary Diabetic Wound/Ulcer of the Lower Etiology: Extremity Wound Location: Right Lower Leg Wound Status: Open Wounding Event: Gradually Appeared Comorbid Type II Diabetes Date Acquired: 12/16/2016 History: Weeks Of Treatment: 0 Clustered Wound: No Photos Photo Uploaded By: Alric Quan on 12/30/2016 14:09:06 Wound Measurements Length: (  cm) 1 Width: (cm) 0.5 Depth: (cm) 0.1 Area: (cm) 0.393 Volume: (cm) 0.039 % Reduction in Area: % Reduction in Volume: Epithelialization: None Tunneling: No Undermining: No Wound Description Classification: Grade 1 Wound Margin: Flat and Intact Exudate Amount: Small Exudate Type: Serous Exudate Color: amber Foul Odor After Cleansing: No Slough/Fibrino No Wound Bed Granulation Amount: Large (67-100%) Granulation Quality: Red Necrotic Amount: None Present (0%) Periwound Skin Texture Karch, Melessa F. ([id number]) Texture Color No Abnormalities Noted: No No Abnormalities Noted: No Moisture Temperature / Pain No Abnormalities Noted: No Temperature: No Abnormality Tenderness on Palpation: Yes Wound Preparation Ulcer Cleansing: Rinsed/Irrigated with Saline Topical Anesthetic Applied: Other: lidocaine 4%, Electronic Signature(s) Signed: 12/30/2016 4:14:11 PM By: Alric Quan Entered By: Alric Quan on 12/30/2016 11:38:21 Polidore, Shelbi F.  ([id number]) -------------------------------------------------------------------------------- Vitals Details Patient Name: Pizana, Cherri F. Date of Service: 12/30/2016 10:30 AM Medical Record Number: [id number] Patient Account Number: [id number] Date of Birth/Sex: [birth year] (83 y.o. Female) Treating RN: Carolyne Fiscal, Debi Primary Care Ashten Prats: Halina Maidens Other Clinician: Referring Ignazio Kincaid: Halina Maidens Treating Winter Trefz/Extender: Melburn Hake, HOYT Weeks in Treatment: 0 Vital Signs Time Taken: 10:54 Temperature (F): 97.6 Height (in): 63 Pulse (bpm): 66 Source: Stated Respiratory Rate (breaths/min): 18 Weight (lbs): 164.8 Blood Pressure (mmHg): 130/84 Source: Measured Reference Range: 80 - 120 mg / dl Body Mass Index (BMI): 29.2 Electronic Signature(s) Signed: 12/30/2016 4:14:11 PM By: Alric Quan Entered By: Alric Quan on 12/30/2016 10:56:47

## 2017-01-06 ENCOUNTER — Encounter: Payer: Medicare PPO | Admitting: Surgery

## 2017-01-06 DIAGNOSIS — E11622 Type 2 diabetes mellitus with other skin ulcer: Secondary | ICD-10-CM | POA: Diagnosis not present

## 2017-01-08 NOTE — Progress Notes (Signed)
ROUSSIN, Jamariah F. ([id number]) Visit Report for 01/06/2017 Arrival Information Details Patient Name: Laura [name], [name] Date of Service: 01/06/2017 1:30 PM Medical Record Number: [id number] Patient Account Number: [id number] Date of Birth/Sex: 27-Jun-1924 (83 y.o. Female) Treating RN: Cornell Barman Primary Care Gali Spinney: Halina Maidens Other Clinician: Referring Maya Scholer: Halina Maidens Treating Akhil Piscopo/Extender: Frann Rider in Treatment: 1 Visit Information History Since Last Visit Added or deleted any medications: No Patient Arrived: Kasandra Knudsen Any new allergies or adverse reactions: No Arrival Time: 13:48 Had a fall or experienced change in No Accompanied By: friend activities of daily living that may affect Transfer Assistance: None risk of falls: Patient Identification Verified: Yes Signs or symptoms of abuse/neglect since last No Secondary Verification Process Completed: Yes visito Patient Requires Transmission-Based No Hospitalized since last visit: No Precautions: Has Dressing in Place as Prescribed: Yes Patient Has Alerts: Yes Pain Present Now: No Patient Alerts: DM II Electronic Signature(s) Signed: 01/06/2017 4:53:37 PM By: Gretta Cool, BSN, RN, CWS, Kim RN, BSN Entered By: Gretta Cool, BSN, RN, CWS, Kim on 01/06/2017 13:50:16 Majid, Boots F. ([id number]) -------------------------------------------------------------------------------- Clinic Level of Care Assessment Details Patient Name: Laura [name], [name] Date of Service: 01/06/2017 1:30 PM Medical Record Number: [id number] Patient Account Number: [id number] Date of Birth/Sex: 01-17-1925 (83 y.o. Female) Treating RN: Cornell Barman Primary Care Winston Misner: Halina Maidens Other Clinician: Referring Cleona Doubleday: Halina Maidens Treating Lachlan Mckim/Extender: Frann Rider in Treatment: 1 Clinic Level of Care Assessment Items TOOL 4 Quantity Score []  - Use when only an EandM is performed on FOLLOW-UP visit 0 ASSESSMENTS - Nursing Assessment /  Reassessment []  - Reassessment of Co-morbidities (includes updates in patient status) 0 X - Reassessment of Adherence to Treatment Plan 1 5 ASSESSMENTS - Wound and Skin Assessment / Reassessment X - Simple Wound Assessment / Reassessment - one wound 1 5 []  - Complex Wound Assessment / Reassessment - multiple wounds 0 []  - Dermatologic / Skin Assessment (not related to wound area) 0 ASSESSMENTS - Focused Assessment []  - Circumferential Edema Measurements - multi extremities 0 []  - Nutritional Assessment / Counseling / Intervention 0 []  - Lower Extremity Assessment (monofilament, tuning fork, pulses) 0 []  - Peripheral Arterial Disease Assessment (using hand held doppler) 0 ASSESSMENTS - Ostomy and/or Continence Assessment and Care []  - Incontinence Assessment and Management 0 []  - Ostomy Care Assessment and Management (repouching, etc.) 0 PROCESS - Coordination of Care X - Simple Patient / Family Education for ongoing care 1 15 []  - Complex (extensive) Patient / Family Education for ongoing care 0 X - Staff obtains Programmer, systems, Records, Test Results / Process Orders 1 10 []  - Staff telephones HHA, Nursing Homes / Clarify orders / etc 0 []  - Routine Transfer to another Facility (non-emergent condition) 0 Spanos, Andjela F. ([id number]) []  - Routine Hospital Admission (non-emergent condition) 0 []  - New Admissions / Biomedical engineer / Ordering NPWT, Apligraf, etc. 0 []  - Emergency Hospital Admission (emergent condition) 0 X - Simple Discharge Coordination 1 10 []  - Complex (extensive) Discharge Coordination 0 PROCESS - Special Needs []  - Pediatric / Minor Patient Management 0 []  - Isolation Patient Management 0 []  - Hearing / Language / Visual special needs 0 []  - Assessment of Community assistance (transportation, D/C planning, etc.) 0 []  - Additional assistance / Altered mentation 0 []  - Support Surface(s) Assessment (bed, cushion, seat, etc.) 0 INTERVENTIONS - Wound Cleansing /  Measurement X - Simple Wound Cleansing - one wound 1 5 []  - Complex Wound Cleansing - multiple wounds 0 X - Wound  Imaging (photographs - any number of wounds) 1 5 []  - Wound Tracing (instead of photographs) 0 X - Simple Wound Measurement - one wound 1 5 []  - Complex Wound Measurement - multiple wounds 0 INTERVENTIONS - Wound Dressings []  - Small Wound Dressing one or multiple wounds 0 X - Medium Wound Dressing one or multiple wounds 1 15 []  - Large Wound Dressing one or multiple wounds 0 []  - Application of Medications - topical 0 []  - Application of Medications - injection 0 INTERVENTIONS - Miscellaneous []  - External ear exam 0 Pennings, Gerre F. ([id number]) []  - Specimen Collection (cultures, biopsies, blood, body fluids, etc.) 0 []  - Specimen(s) / Culture(s) sent or taken to Lab for analysis 0 []  - Patient Transfer (multiple staff / Harrel Lemon Lift / Similar devices) 0 []  - Simple Staple / Suture removal (25 or less) 0 []  - Complex Staple / Suture removal (26 or more) 0 []  - Hypo / Hyperglycemic Management (close monitor of Blood Glucose) 0 []  - Ankle / Brachial Index (ABI) - do not check if billed separately 0 X - Vital Signs 1 5 Has the patient been seen at the hospital within the last three years: Yes Total Score: 80 Level Of Care: New/Established - Level 3 Electronic Signature(s) Signed: 01/06/2017 4:53:37 PM By: Gretta Cool, BSN, RN, CWS, Kim RN, BSN Entered By: Gretta Cool, BSN, RN, CWS, Kim on 01/06/2017 14:35:27 Moss, Elnor F. ([id number]) -------------------------------------------------------------------------------- Encounter Discharge Information Details Patient Name: Laura [name], [name] Date of Service: 01/06/2017 1:30 PM Medical Record Number: [id number] Patient Account Number: [id number] Date of Birth/Sex: 1925-03-10 (83 y.o. Female) Treating RN: Cornell Barman Primary Care Masako Overall: Halina Maidens Other Clinician: Referring Lilu Mcglown: Halina Maidens Treating Aleese Kamps/Extender: Frann Rider in Treatment: 1 Encounter Discharge Information Items Discharge Pain Level: 0 Discharge Condition: Stable Ambulatory Status: Cane Discharge Destination: Home Transportation: Private Auto Accompanied By: friend Schedule Follow-up Appointment: Yes Medication Reconciliation completed Yes and provided to Patient/Care Sieara Bremer: Provided on Clinical Summary of Care: 01/06/2017 Form Type Recipient Paper Patient [facility] Electronic Signature(s) Signed: 01/06/2017 4:53:37 PM By: Gretta Cool, BSN, RN, CWS, Kim RN, BSN Previous Signature: 01/06/2017 2:35:09 PM Version By: Ruthine Dose Entered By: Gretta Cool BSN, RN, CWS, Kim on 01/06/2017 14:40:39 Swearingin, Christmas F. ([id number]) -------------------------------------------------------------------------------- Lower Extremity Assessment Details Patient Name: Laura [name], [name] Date of Service: 01/06/2017 1:30 PM Medical Record Number: [id number] Patient Account Number: [id number] Date of Birth/Sex: 1925/06/18 (83 y.o. Female) Treating RN: Cornell Barman Primary Care Kandie Keiper: Halina Maidens Other Clinician: Referring Janese Radabaugh: Halina Maidens Treating Ashtin Rosner/Extender: Frann Rider in Treatment: 1 Edema Assessment Assessed: Shirlyn Goltz: Yes] [Right: No] Edema: [Left: Ye] [Right: s] Calf Left: Right: Point of Measurement: 27 cm From Medial Instep 34.4 cm cm Ankle Left: Right: Point of Measurement: 10 cm From Medial Instep 23 cm cm Vascular Assessment Pulses: Dorsalis Pedis Palpable: [Left:Yes] Posterior Tibial Extremity colors, hair growth, and conditions: Extremity Color: [Left:Red] Hair Growth on Extremity: [Left:No] Temperature of Extremity: [Left:Warm] Capillary Refill: [Left:< 3 seconds] Dependent Rubor: [Left:No] Blanched when Elevated: [Left:No] Lipodermatosclerosis: [Left:No] Toe Nail Assessment Left: Right: Thick: No Discolored: No Deformed: No Improper Length and Hygiene: No Notes Left great toe amputation. Foss, Alesa F.  ([id number]) Electronic Signature(s) Signed: 01/06/2017 4:53:37 PM By: Gretta Cool, BSN, RN, CWS, Kim RN, BSN Entered By: Gretta Cool, BSN, RN, CWS, Kim on 01/06/2017 14:04:47 Mearns, Monai F. ([id number]) -------------------------------------------------------------------------------- Multi Wound Chart Details Patient Name: Laura [name], [name] Date of Service: 01/06/2017 1:30 PM Medical Record Number: [id number] Patient Account Number: [id number] Date of  Birth/Sex: Sep 26, 1924 (83 y.o. Female) Treating RN: Cornell Barman Primary Care Erisa Mehlman: Halina Maidens Other Clinician: Referring Aleric Froelich: Halina Maidens Treating Selah Klang/Extender: Frann Rider in Treatment: 1 Vital Signs Height(in): 63 Pulse(bpm): 89 Weight(lbs): 164.8 Blood Pressure 173/97 (mmHg): Body Mass Index(BMI): 29 Temperature(F): 98.2 Respiratory Rate 16 (breaths/min): Photos: [2:No Photos] [N/A:N/A] Wound Location: Left Lower Leg - Lateral Right Lower Leg N/A Wounding Event: Gradually Appeared Gradually Appeared N/A Primary Etiology: Diabetic Wound/Ulcer of Diabetic Wound/Ulcer of N/A the Lower Extremity the Lower Extremity Comorbid History: Type II Diabetes N/A N/A Date Acquired: 12/16/2016 12/16/2016 N/A Weeks of Treatment: 1 1 N/A Wound Status: Open Healed - Epithelialized N/A Measurements L x W x D 2x1x0.1 0x0x0 N/A (cm) Area (cm) : 1.571 0 N/A Volume (cm) : 0.157 0 N/A % Reduction in Area: -60.00% 100.00% N/A % Reduction in Volume: -60.20% 100.00% N/A Classification: Grade 1 Grade 1 N/A Exudate Amount: Large N/A N/A Exudate Type: Serous N/A N/A Exudate Color: amber N/A N/A Wound Margin: Flat and Intact N/A N/A Granulation Amount: Medium (34-66%) N/A N/A Granulation Quality: Pink N/A N/A Necrotic Amount: Medium (34-66%) N/A N/A Exposed Structures: Fat Layer (Subcutaneous N/A N/A Tissue) Exposed: Yes Mancha, Lyvonne F. ([id number]) Fascia: No Tendon: No Muscle: No Joint: No Bone: No Epithelialization: None N/A  N/A Periwound Skin Texture: Excoriation: Yes No Abnormalities Noted N/A Induration: No Callus: No Crepitus: No Rash: No Scarring: No Periwound Skin Maceration: Yes No Abnormalities Noted N/A Moisture: Dry/Scaly: No Periwound Skin Color: Atrophie Blanche: No No Abnormalities Noted N/A Cyanosis: No Ecchymosis: No Erythema: No Hemosiderin Staining: No Mottled: No Pallor: No Rubor: No Temperature: No Abnormality N/A N/A Tenderness on No No N/A Palpation: Wound Preparation: Ulcer Cleansing: N/A N/A Rinsed/Irrigated with Saline Topical Anesthetic Applied: Other: lidocaine 4% Treatment Notes Wound #1 (Left, Lateral Lower Leg) 1. Cleansed with: Clean wound with Normal Saline 2. Anesthetic Topical Lidocaine 4% cream to wound bed prior to debridement 4. Dressing Applied: Aquacel Ag 5. Secondary Dressing Applied Bordered Foam Dressing Electronic Signature(s) Signed: 01/06/2017 2:55:28 PM By: Christin Fudge MD, FACS Entered By: Christin Fudge on 01/06/2017 14:55:28 Huhn, Sheela F. ([id number]) Kosar, Cinderella F. ([id number]) -------------------------------------------------------------------------------- [place] Details Patient Name: Gren, Timmie F. Date of Service: 01/06/2017 1:30 PM Medical Record Number: [id number] Patient Account Number: [id number] Date of Birth/Sex: 1925-05-05 (83 y.o. Female) Treating RN: Cornell Barman Primary Care Jafeth Mustin: Halina Maidens Other Clinician: Referring Huntleigh Doolen: Halina Maidens Treating Norris Brumbach/Extender: Frann Rider in Treatment: 1 Active Inactive ` Abuse / Safety / Falls / Self Care Management Nursing Diagnoses: Potential for falls Goals: Patient will not experience any injury related to falls Date Initiated: 12/30/2016 Target Resolution Date: 04/29/2017 Goal Status: Active Interventions: Assess Activities of Daily Living upon admission and as needed Assess: immobility, friction, shearing, incontinence upon admission  and as needed Notes: ` Nutrition Nursing Diagnoses: Imbalanced nutrition Impaired glucose control: actual or potential Potential for alteratiion in Nutrition/Potential for imbalanced nutrition Goals: Patient/caregiver will maintain therapeutic glucose control Date Initiated: 12/30/2016 Target Resolution Date: 04/29/2017 Goal Status: Active Interventions: Assess patient nutrition upon admission and as needed per policy Notes: ` Orientation to the [place], [name] ([id number]) Nursing Diagnoses: Knowledge deficit related to the wound healing center program Goals: Patient/caregiver will verbalize understanding of the [place] Date Initiated: 12/30/2016 Target Resolution Date: 01/21/2017 Goal Status: Active Interventions: Provide education on orientation to the wound center Notes: ` Pain, Acute or Chronic Nursing Diagnoses: Pain, acute or chronic: actual or potential Potential  alteration in comfort, pain Goals: Patient/caregiver will verbalize adequate pain control between visits Date Initiated: 12/30/2016 Target Resolution Date: 04/29/2017 Goal Status: Active Interventions: Complete pain assessment as per visit requirements Notes: ` Wound/Skin Impairment Nursing Diagnoses: Impaired tissue integrity Knowledge deficit related to smoking impact on wound healing Knowledge deficit related to ulceration/compromised skin integrity Goals: Ulcer/skin breakdown will have a volume reduction of 80% by week 12 Date Initiated: 12/30/2016 Target Resolution Date: 04/22/2017 Goal Status: Active Interventions: Assess patient/caregiver ability to perform ulcer/skin care regimen upon admission and as needed Notes: SAYWARD, HORVATH ([id number]) Electronic Signature(s) Signed: 01/06/2017 4:53:37 PM By: Gretta Cool, BSN, RN, CWS, Kim RN, BSN Entered By: Gretta Cool, BSN, RN, CWS, Kim on 01/06/2017 14:01:37 Moseley, Florabelle F.  ([id number]) -------------------------------------------------------------------------------- Pain Assessment Details Patient Name: Clinger, Maryiah F. Date of Service: 01/06/2017 1:30 PM Medical Record Number: [id number] Patient Account Number: [id number] Date of Birth/Sex: 1925/04/17 (83 y.o. Female) Treating RN: Cornell Barman Primary Care Vontae Court: Halina Maidens Other Clinician: Referring Riker Collier: Halina Maidens Treating Jacquita Mulhearn/Extender: Frann Rider in Treatment: 1 Active Problems Location of Pain Severity and Description of Pain Patient Has Paino No Site Locations With Dressing Change: No Pain Management and Medication Current Pain Management: Goals for Pain Management Topical or injectable lidocaine is offered to patient for acute pain when surgical debridement is performed. If needed, Patient is instructed to use over the counter pain medication for the following 24-48 hours after debridement. Wound care MDs do not prescribed pain medications. Patient has chronic pain or uncontrolled pain. Patient has been instructed to make an appointment with their Primary Care Physician for pain management. Electronic Signature(s) Signed: 01/06/2017 4:53:37 PM By: Gretta Cool, BSN, RN, CWS, Kim RN, BSN Entered By: Gretta Cool, BSN, RN, CWS, Kim on 01/06/2017 13:50:35 Studnicka, Tkai F. ([id number]) -------------------------------------------------------------------------------- Patient/Caregiver Education Details Patient Name: Laura [name], [name] Date of Service: 01/06/2017 1:30 PM Medical Record Number: [id number] Patient Account Number: [id number] Date of Birth/Gender: December 27, 1924 (83 y.o. Female) Treating RN: Cornell Barman Primary Care Physician: Halina Maidens Other Clinician: Referring Physician: Halina Maidens Treating Physician/Extender: Frann Rider in Treatment: 1 Education Assessment Education Provided To: Patient Education Topics Provided Wound/Skin Impairment: Handouts: Caring for Your  Ulcer Methods: Demonstration, Explain/Verbal Responses: State content correctly Electronic Signature(s) Signed: 01/06/2017 4:53:37 PM By: Gretta Cool, BSN, RN, CWS, Kim RN, BSN Entered By: Gretta Cool, BSN, RN, CWS, Kim on 01/06/2017 14:41:13 Seyer, Branna F. ([id number]) -------------------------------------------------------------------------------- Wound Assessment Details Patient Name: Laura [name], [name] Date of Service: 01/06/2017 1:30 PM Medical Record Number: [id number] Patient Account Number: [id number] Date of Birth/Sex: [birth year] (83 y.o. Female) Treating RN: Cornell Barman Primary Care Montray Kliebert: Halina Maidens Other Clinician: Referring Mylin Gignac: Halina Maidens Treating Keta [place]/Extender: Frann Rider in Treatment: 1 Wound Status Wound Number: 1 Primary Diabetic Wound/Ulcer of the Lower Etiology: Extremity Wound Location: Left Lower Leg - Lateral Wound Status: Open Wounding Event: Gradually Appeared Comorbid Type II Diabetes Date Acquired: 12/16/2016 History: Weeks Of Treatment: 1 Clustered Wound: No Photos Wound Measurements Length: (cm) 2 Width: (cm) 1 Depth: (cm) 0.1 Area: (cm) 1.571 Volume: (cm) 0.157 % Reduction in Area: -60% % Reduction in Volume: -60.2% Epithelialization: None Tunneling: No Undermining: No Wound Description Classification: Grade 1 Wound Margin: Flat and Intact Exudate Amount: Large Exudate Type: Serous Exudate Color: amber Foul Odor After Cleansing: No Slough/Fibrino No Wound Bed Granulation Amount: Medium (34-66%) Exposed Structure Granulation Quality: Pink Fascia Exposed: No Necrotic Amount: Medium (34-66%) Fat Layer (Subcutaneous Tissue) Exposed: Yes Necrotic Quality: Adherent Slough Tendon Exposed: No Muscle Exposed: No Joint  Exposed: No Bone Exposed: No Besancon, Nessie F. ([id number]) Periwound Skin Texture Texture Color No Abnormalities Noted: No No Abnormalities Noted: No Callus: No Atrophie Blanche: No Crepitus: No Cyanosis:  No Excoriation: Yes Ecchymosis: No Induration: No Erythema: No Rash: No Hemosiderin Staining: No Scarring: No Mottled: No Pallor: No Moisture Rubor: No No Abnormalities Noted: No Dry / Scaly: No Temperature / Pain Maceration: Yes Temperature: No Abnormality Wound Preparation Ulcer Cleansing: Rinsed/Irrigated with Saline Topical Anesthetic Applied: Other: lidocaine 4%, Treatment Notes Wound #1 (Left, Lateral Lower Leg) 1. Cleansed with: Clean wound with Normal Saline 2. Anesthetic Topical Lidocaine 4% cream to wound bed prior to debridement 4. Dressing Applied: Aquacel Ag 5. Secondary Dressing Applied Bordered Foam Dressing Electronic Signature(s) Signed: 01/06/2017 4:53:37 PM By: Gretta Cool, BSN, RN, CWS, Kim RN, BSN Entered By: Gretta Cool, BSN, RN, CWS, Kim on 01/06/2017 13:58:01 Deerman, Sasha F. ([id number]) -------------------------------------------------------------------------------- Wound Assessment Details Patient Name: Laura [name], [name] Date of Service: 01/06/2017 1:30 PM Medical Record Number: [id number] Patient Account Number: [id number] Date of Birth/Sex: 1924/12/06 (83 y.o. Female) Treating RN: Cornell Barman Primary Care Mahari Vankirk: Halina Maidens Other Clinician: Referring [name] Knauff: Halina Maidens Treating Seniya Stoffers/Extender: Frann Rider in Treatment: 1 Wound Status Wound Number: 2 Primary Diabetic Wound/Ulcer of the Lower Etiology: Extremity Wound Location: Right Lower Leg Wound Status: Healed - Epithelialized Wounding Event: Gradually Appeared Date Acquired: 12/16/2016 Weeks Of Treatment: 1 Clustered Wound: No Photos Photo Uploaded By: Gretta Cool, BSN, RN, CWS, Kim on 01/06/2017 16:23:45 Wound Measurements Length: (cm) 0 Width: (cm) 0 Depth: (cm) 0 Area: (cm) 0 Volume: (cm) 0 % Reduction in Area: 100% % Reduction in Volume: 100% Wound Description Classification: Grade 1 Periwound Skin Texture Texture Color No Abnormalities Noted: No No Abnormalities  Noted: No Moisture No Abnormalities Noted: No Electronic Signature(s) Signed: 01/06/2017 4:53:37 PM By: Gretta Cool, BSN, RN, CWS, Kim RN, BSN Entered By: Gretta Cool, BSN, RN, CWS, Kim on 01/06/2017 13:55:03 Benito, Cherri F. ([id number]) -------------------------------------------------------------------------------- Vitals Details Patient Name: Laura [name], [name] Date of Service: 01/06/2017 1:30 PM Medical Record Number: [id number] Patient Account Number: [id number] Date of Birth/Sex: [birth year] (83 y.o. Female) Treating RN: Cornell Barman Primary Care Amori Cooperman: Halina Maidens Other Clinician: Referring Bobby Barton: Halina Maidens Treating Christinea Brizuela/Extender: Frann Rider in Treatment: 1 Vital Signs Time Taken: 13:50 Temperature (F): 98.2 Height (in): 63 Pulse (bpm): 89 Weight (lbs): 164.8 Respiratory Rate (breaths/min): 16 Body Mass Index (BMI): 29.2 Blood Pressure (mmHg): 173/97 Reference Range: 80 - 120 mg / dl Notes Patient has had a bad day. BP is elevated she feels due to stress and pain. MD notified 14:10 Recheck 150/77. Electronic Signature(s) Signed: 01/06/2017 4:53:37 PM By: Gretta Cool, BSN, RN, CWS, Kim RN, BSN Entered By: Gretta Cool, BSN, RN, CWS, Kim on 01/06/2017 14:10:47

## 2017-01-08 NOTE — Progress Notes (Signed)
[name], [name] ([id number]) Visit Report for 01/06/2017 Chief Complaint Document Details [name], [name] Date of Service: 01/06/2017 1:30 PM Medical Record Number: [id number] Patient Account Number: [id number] Date of Birth/Sex: 02/05/1925 (83 y.o. Female) Treating RN: Laura [name] Primary Care Provider: Halina [name] Other Clinician: Referring Provider: Halina [name] Treating Provider/Extender: Laura [name] in Treatment: 1 Information Obtained from: Patient Chief Complaint Left lower leg cellulitis Electronic Signature(s) Signed: 01/06/2017 2:55:34 PM By: Laura Fudge MD, FACS Entered By: Laura [name] on 01/06/2017 14:55:33 [name], [name] ([id number]) -------------------------------------------------------------------------------- HPI Details [name], [name] Date of Service: 01/06/2017 1:30 PM Medical Record Number: [id number] Patient Account Number: [id number] Date of Birth/Sex: [birth year] (83 y.o. Female) Treating RN: Laura [name] Primary Care Provider: Halina [name] Other Clinician: Referring Provider: Halina [name] Treating Provider/Extender: Laura [name] in Treatment: 1 History of Present Illness HPI Description: 12/30/16 Patient presents today for initial evaluation concerning an area over her left lateral ankle which she tells me has been intermittent in nature since 2000. However the current opening has been present for about two weeks. She has been tolerating the dressing changes at home with antibiotic ointment but that is really the only treatment that has been initiated at this point. That is other than the oral antibiotics which was Keflex that patient was placed on by her primary care provider prior to being referred to [place]. She does have some discomfort but rates this to be around a 2-3 out of 10 fortunately the redness does not seem to be spreading to any other location as far as her lower extremity is concerned. She does tell me that  in the past antibiotics have been beneficial for her unfortunately the current antibiotics have not been of benefit and no wound culture was performed as of yet. 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 She does have diabetes and are most recent blood sugars have run between 104 and 106 according to patient's although her last hemoglobin A1c she is unaware of. Patient's current wound does not appear to be significantly open but is rather more of a weeping region of cellulitis. Electronic Signature(s) Signed: 01/06/2017 2:55:40 PM By: Laura Fudge MD, FACS Entered By: Laura [name] on 01/06/2017 14:55:39 [name], [name] ([id number]) -------------------------------------------------------------------------------- Physical Exam Details Patient Name: Andes, Esmay F. Date of Service: 01/06/2017 1:30 PM Medical Record Number: [id number] Patient Account Number: [id number] Date of Birth/Sex: [birth year] (83 y.o. Female) Treating RN: Laura [name] Primary Care Provider: Halina [name] Other Clinician: Referring Provider: Halina [name] Treating Provider/Extender: Laura [name] in Treatment: 1 Constitutional . Pulse regular. Respirations normal and unlabored. Afebrile. . Eyes Nonicteric. Reactive to light. Ears, Nose, Mouth, and Throat Lips, teeth, and gums WNL.Marland Kitchen Moist mucosa without lesions. Neck supple and nontender. No palpable supraclavicular or cervical adenopathy. Normal sized without goiter. Respiratory WNL. No retractions.. Cardiovascular Pedal Pulses WNL. No clubbing, cyanosis or edema. Chest Breasts symmetical and no nipple discharge.. Breast tissue WNL, no masses, lumps, or tenderness.. Lymphatic No adneopathy. No adenopathy. No adenopathy. Musculoskeletal Adexa without  tenderness or enlargement.. Digits and nails w/o clubbing, cyanosis, infection, petechiae, ischemia, or inflammatory conditions.. Integumentary (Hair, Skin) No suspicious lesions. No crepitus or fluctuance. No peri-wound warmth or erythema. No masses.Marland Kitchen Psychiatric Judgement and insight Intact.. No evidence of depression, anxiety, or  agitation.. Notes there is significant lymphedema of the left lower extremity and this is more above the wound then below. The wound itself is superficial but has some excoriation and is moist around the main wound. Electronic Signature(s) Signed: 01/06/2017 2:56:09 PM By: Laura Fudge MD, FACS Entered By: Laura [name] on 01/06/2017 14:56:08 [name], [name] ([id number]) -------------------------------------------------------------------------------- Physician Orders Details Patient Name: Krupinski, Adely F. Date of Service: 01/06/2017 1:30 PM Medical Record Number: [id number] Patient Account Number: [id number] Date of Birth/Sex: 02-17-1925 (83 y.o. Female) Treating RN: Laura [name] Primary Care Provider: Halina [name] Other Clinician: Referring Provider: Halina [name] Treating Provider/Extender: Laura [name] in Treatment: 1 Verbal / Phone Orders: No Diagnosis Coding Wound Cleansing Wound #1 Left,Lateral Lower Leg o Clean wound with Normal Saline. Anesthetic Wound #1 Left,Lateral Lower Leg o Topical Lidocaine 4% cream applied to wound bed prior to debridement Primary Wound Dressing Wound #1 Left,Lateral Lower Leg o Aquacel Ag Secondary Dressing Wound #1 Left,Lateral Lower Leg o Boardered Foam Dressing Dressing Change Frequency Wound #1 Left,Lateral Lower Leg o Change dressing every other day. Follow-up Appointments Wound #1 Left,Lateral Lower Leg o Return Appointment in 1 week. Edema Control Wound #1 Left,Lateral Lower Leg o Elevate legs to the level of the heart and pump ankles as often as possible Electronic Signature(s) Signed:  01/06/2017 4:13:09 PM By: Laura Fudge MD, FACS Signed: 01/06/2017 4:53:37 PM By: Gretta Cool, BSN, RN, CWS, Kim RN, BSN Entered By: Gretta Cool, BSN, RN, CWS, Kim on 01/06/2017 14:34:32 [name], [name] ([id number]) [name], [name] ([id number]) -------------------------------------------------------------------------------- Problem List Details [name], [name] Date of Service: 01/06/2017 1:30 PM Medical Record Number: [id number] Patient Account Number: [id number] Date of Birth/Sex: [birth year] (83 y.o. Female) Treating RN: Laura [name] Primary Care Provider: Halina [name] Other Clinician: Referring Provider: Halina [name] Treating Provider/Extender: Laura [name] in Treatment: 1 Active Problems ICD-10 Encounter Code Description Active Date Diagnosis E11.622 Type 2 diabetes mellitus with other skin ulcer 01/02/2017 Yes L03.116 Cellulitis of left lower limb 01/02/2017 Yes I10 Essential (primary) hypertension 01/02/2017 Yes Inactive Problems Resolved Problems Electronic Signature(s) Signed: 01/06/2017 2:55:23 PM By: Laura Fudge MD, FACS Entered By: Laura [name] on 01/06/2017 14:55:23 [name], [name] ([id number]) -------------------------------------------------------------------------------- Progress Note Details [name], [name] Date of Service: 01/06/2017 1:30 PM Medical Record Number: [id number] Patient Account Number: [id number] Date of Birth/Sex: [birth year] (83 y.o. Female) Treating RN: Laura [name] Primary Care Provider: Halina [name] Other Clinician: Referring Provider: Halina [name] Treating Provider/Extender: Laura [name] in Treatment: 1 Subjective Chief Complaint Information obtained from Patient Left lower leg cellulitis History of Present Illness (HPI) 12/30/16 Patient presents today for initial evaluation concerning an area over her left lateral ankle which she tells me has been intermittent in nature since 2000. However the current opening has been  present for about two weeks. She has been tolerating the dressing changes at home with antibiotic ointment but that is really the only treatment that has been initiated at this point. That is other than the oral antibiotics which was Keflex that patient was placed on by her primary care provider prior to being referred to [place]. She does have some discomfort but rates this to be around a 2-3 out of 10 fortunately the redness does not seem to be spreading to any other location as far as her lower extremity is concerned. She does tell me that in the past antibiotics have been beneficial for her unfortunately the current antibiotics have not been of benefit and no wound culture was  performed as of yet. 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 She does have diabetes and are most recent blood sugars have run between 104 and 106 according to patient's although her last hemoglobin A1c she is unaware of. Patient's current wound does not appear to be significantly open but is rather more of a weeping region of cellulitis. Objective Constitutional Pulse regular. Respirations normal and unlabored. Afebrile. Vitals Time Taken: 1:50 PM, Height: 63 in, Weight: 164.8 lbs, BMI: 29.2, Temperature: 98.2 F, Pulse: 89 bpm, Respiratory Rate: 16 breaths/min, Blood Pressure: 173/97 mmHg. General Notes: Patient has had a bad day. BP is elevated she feels due to stress and pain. MD notified 14:10 Recheck 150/77. [name], [name] ([id number]) Eyes Nonicteric. Reactive to light. Ears, Nose, Mouth, and Throat Lips, teeth, and gums WNL.Marland Kitchen Moist mucosa without lesions. Neck supple and nontender. No palpable supraclavicular or cervical adenopathy. Normal sized without goiter. Respiratory WNL. No  retractions.. Cardiovascular Pedal Pulses WNL. No clubbing, cyanosis or edema. Chest Breasts symmetical and no nipple discharge.. Breast tissue WNL, no masses, lumps, or tenderness.. Lymphatic No adneopathy. No adenopathy. No adenopathy. Musculoskeletal Adexa without tenderness or enlargement.. Digits and nails w/o clubbing, cyanosis, infection, petechiae, ischemia, or inflammatory conditions.Marland Kitchen Psychiatric Judgement and insight Intact.. No evidence of depression, anxiety, or agitation.. General Notes: there is significant lymphedema of the left lower extremity and this is more above the wound then below. The wound itself is superficial but has some excoriation and is moist around the main wound. Integumentary (Hair, Skin) No suspicious lesions. No crepitus or fluctuance. No peri-wound warmth or erythema. No masses.. Wound #1 status is Open. Original cause of wound was Gradually Appeared. The wound is located on the Left,Lateral Lower Leg. The wound measures 2cm length x 1cm width x 0.1cm depth; 1.571cm^2 area and 0.157cm^3 volume. There is Fat Layer (Subcutaneous Tissue) Exposed exposed. There is no tunneling or undermining noted. There is a large amount of serous drainage noted. The wound margin is flat and intact. There is medium (34-66%) pink granulation within the wound bed. There is a medium (34-66%) amount of necrotic tissue within the wound bed including Adherent Slough. The periwound skin appearance exhibited: Excoriation, Maceration. The periwound skin appearance did not exhibit: Callus, Crepitus, Induration, Rash, Scarring, Dry/Scaly, Atrophie Blanche, Cyanosis, Ecchymosis, Hemosiderin Staining, Mottled, Pallor, Rubor, Erythema. Periwound temperature was noted as No Abnormality. Wound #2 status is Healed - Epithelialized. Original cause of wound was Gradually Appeared. The wound is located on the Right Lower Leg. The wound measures 0cm length x 0cm width x 0cm depth; 0cm^2  area and 0cm^3 volume. Schnoor, [name] ([id number]) Assessment Active Problems ICD-10 E11.622 - Type 2 diabetes mellitus with other skin ulcer L03.116 - Cellulitis of left lower limb I10 - Essential (primary) hypertension Plan Wound Cleansing: Wound #1 Left,Lateral Lower Leg: Clean wound with Normal Saline. Anesthetic: Wound #1 Left,Lateral Lower Leg: Topical Lidocaine 4% cream applied to wound bed prior to debridement Primary Wound Dressing: Wound #1 Left,Lateral Lower Leg: Aquacel Ag Secondary Dressing: Wound #1 Left,Lateral Lower Leg: Boardered Foam Dressing Dressing Change Frequency: Wound #1 Left,Lateral Lower Leg: Change dressing every other day. Follow-up Appointments: Wound #1 Left,Lateral Lower Leg: Return Appointment in 1 week. Edema Control: Wound #1 Left,Lateral Lower Leg: Elevate legs to the level of the heart  and pump ankles as often as possible after review today I have recommended: 1. Elevation and exercise which is very important for her to reduce her lymphedema as she cannot wear any compression wraps Mccutchen, Enaya F. ([id number]) 2. Silver alginate and a bordered foam to hold this in place 3. Appropriate proteins, vitamin A, vitamin C and zinc 4. Regular visits the wound center Electronic Signature(s) Signed: 01/06/2017 2:57:06 PM By: Laura Fudge MD, FACS Entered By: Laura [name] on 01/06/2017 14:57:06 Schewe, Dustyn F. ([id number]) -------------------------------------------------------------------------------- SuperBill Details Patient Name: Vallandingham, Lezlie F. Date of Service: 01/06/2017 Medical Record Number: [id number] Patient Account Number: [id number] Date of Birth/Sex: 1925/01/25 (83 y.o. Female) Treating RN: Laura [name] Primary Care Provider: Halina [name] Other Clinician: Referring Provider: Halina [name] Treating Provider/Extender: Laura [name] in Treatment: 1 Diagnosis Coding ICD-10 Codes Code Description [phone] Type 2 diabetes mellitus with  other skin ulcer L03.116 Cellulitis of left lower limb I10 Essential (primary) hypertension Facility Procedures CPT4 Code: [id number] Description: 99213 - WOUND CARE VISIT-LEV 3 EST PT Modifier: Quantity: 1 Physician Procedures CPT4 Code: [id number] Description: 11657 - WC PHYS LEVEL 3 - EST PT ICD-10 Description Diagnosis E11.622 Type 2 diabetes mellitus with other skin ulcer L03.116 Cellulitis of left lower limb I10 Essential (primary) hypertension Modifier: Quantity: 1 Electronic Signature(s) Signed: 01/06/2017 2:57:21 PM By: Laura Fudge MD, FACS Entered By: Laura [name] on 01/06/2017 14:57:21

## 2017-01-16 ENCOUNTER — Encounter: Payer: Medicare PPO | Admitting: Surgery

## 2017-01-16 DIAGNOSIS — E11622 Type 2 diabetes mellitus with other skin ulcer: Secondary | ICD-10-CM | POA: Diagnosis not present

## 2017-01-17 NOTE — Progress Notes (Signed)
[place], [name] ([id number]) Visit Report for 01/16/2017 Chief Complaint Document Details Patient Name: Laura [name], [name] Date of Service: 01/16/2017 1:30 PM Medical Record Number: [id number] Patient Account Number: [id number] Date of Birth/Sex: 30-Apr-1925 (83 y.o. Female) Treating RN: Laura [name] Primary Care Provider: Halina [name] Other Clinician: Referring Provider: Halina [name] Treating Provider/Extender: Laura [name] in Treatment: 2 Information Obtained from: Patient Chief Complaint Left lower leg cellulitis Electronic Signature(s) Signed: 01/16/2017 2:16:15 PM By: Laura Fudge MD, FACS Entered By: Laura [name] on 01/16/2017 14:16:15 [name], [name] ([id number]) -------------------------------------------------------------------------------- HPI Details Patient Name: Laura [name], [name] Date of Service: 01/16/2017 1:30 PM Medical Record Number: [id number] Patient Account Number: [id number] Date of Birth/Sex: Jan 14, 1925 (83 y.o. Female) Treating RN: Laura [name] Primary Care Provider: Halina [name] Other Clinician: Referring Provider: Halina [name] Treating Provider/Extender: Laura [name] in Treatment: 2 History of Present Illness HPI Description: 12/30/16 Patient presents today for initial evaluation concerning an area over her left lateral ankle which she tells me has been intermittent in nature since 2000. However the current opening has been present for about two weeks. She has been tolerating the dressing changes at home with antibiotic ointment but that is really the only treatment that has been initiated at this point. That is other than the oral antibiotics which was Keflex that patient was placed on by her primary care provider prior to being referred to [place]. She does have some discomfort but rates this to be around a 2-3 out of 10 fortunately the redness does not seem to be spreading to any other location as far as her lower extremity is concerned. She does  tell me that in the past antibiotics have been beneficial for her unfortunately the current antibiotics have not been of benefit and no wound culture was performed as of yet. She has previously seen Dr. 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 She does have diabetes and are most recent blood sugars have run between 104 and 106 according to patient's although her last hemoglobin A1c she is unaware of. Patient's current wound does not appear to be significantly open but is rather more of a weeping region of cellulitis. Electronic Signature(s) Signed: 01/16/2017 2:16:24 PM By: Laura Fudge MD, FACS Entered By: Laura [name] on 01/16/2017 14:16:24 [name], [name] ([id number]) -------------------------------------------------------------------------------- Physical Exam Details Patient Name: Laura [name], [name] Date of Service: 01/16/2017 1:30 PM Medical Record Number: [id number] Patient Account Number: [id number] Date of Birth/Sex: Apr 14, 1925 (83 y.o. Female) Treating RN: Laura [name] Primary Care Provider: Halina [name] Other Clinician: Referring Provider: Halina [name] Treating Provider/Extender: Laura [name] in Treatment: 2 Constitutional . Pulse regular. Respirations normal and unlabored. Afebrile. . Eyes Nonicteric. Reactive to light. Ears, Nose, Mouth, and Throat Lips, teeth, and gums WNL.Marland Kitchen Moist mucosa without lesions. Neck supple and nontender. No palpable supraclavicular or cervical adenopathy. Normal sized without goiter. Respiratory WNL. No retractions.. Cardiovascular Pedal Pulses WNL. No clubbing, cyanosis or edema. Lymphatic No adneopathy. No adenopathy. No adenopathy. Musculoskeletal Adexa without tenderness or enlargement.. Digits and nails w/o clubbing, cyanosis, infection,  petechiae, ischemia, or inflammatory conditions.. Integumentary (Hair, Skin) No suspicious lesions. No crepitus or fluctuance. No peri-wound warmth or erythema. No masses.Marland Kitchen Psychiatric Judgement and insight Intact.. No evidence of depression, anxiety, or agitation.. Notes patient continues to have significant amount of lymphedema and is unable to get  this reduced by just elevation. The wound is superficial and there is minimal excoriation around it. No sharp debridement was required today. Electronic Signature(s) Signed: 01/16/2017 2:17:02 PM By: Laura Fudge MD, FACS Entered By: Laura [name] on 01/16/2017 14:17:01 Laura [name], [name] ([id number]) -------------------------------------------------------------------------------- Physician Orders Details Patient Name: Laura [name], [name] Date of Service: 01/16/2017 1:30 PM Medical Record Number: [id number] Patient Account Number: [id number] Date of Birth/Sex: 04-27-1925 (83 y.o. Female) Treating RN: Laura [name] Primary Care Provider: Halina [name] Other Clinician: Referring Provider: Halina [name] Treating Provider/Extender: Laura [name] in Treatment: 2 Verbal / Phone Orders: Yes Clinician: Carolyne Fiscal, [name] Read Back and Verified: Yes Diagnosis Coding Wound Cleansing Wound #1 Left,Lateral Lower Leg o Clean wound with Normal Saline. Anesthetic Wound #1 Left,Lateral Lower Leg o Topical Lidocaine 4% cream applied to wound bed prior to debridement Primary Wound Dressing Wound #1 Left,Lateral Lower Leg o Aquacel Ag Secondary Dressing Wound #1 Left,Lateral Lower Leg o ABD pad o Conform/Kerlix o Drawtex o Other - stretch netting #4 Dressing Change Frequency Wound #1 Left,Lateral Lower Leg o Change dressing every other day. Follow-up Appointments Wound #1 Left,Lateral Lower Leg o Return Appointment in 1 week. Edema Control Wound #1 Left,Lateral Lower Leg o Elevate legs to the level of the heart and pump  ankles as often as possible Additional Orders / Instructions Wound #1 Left,Lateral Lower Leg o Increase protein intake. [name], [name] ([id number]) Electronic Signature(s) Signed: 01/16/2017 4:12:07 PM By: Laura Fudge MD, FACS Signed: 01/16/2017 5:05:30 PM By: Alric Quan Entered By: Alric Quan on 01/16/2017 13:58:51 Laura [name], [name] ([id number]) -------------------------------------------------------------------------------- Problem List Details Patient Name: Laura [name], [name] Date of Service: 01/16/2017 1:30 PM Medical Record Number: [id number] Patient Account Number: [id number] Date of Birth/Sex: [birth year] (83 y.o. Female) Treating RN: Laura [name] Primary Care Provider: Halina [name] Other Clinician: Referring Provider: Halina [name] Treating Provider/Extender: Laura [name] in Treatment: 2 Active Problems ICD-10 Encounter Code Description Active Date Diagnosis E11.622 Type 2 diabetes mellitus with other skin ulcer 01/02/2017 Yes I10 Essential (primary) hypertension 01/02/2017 Yes I89.0 Lymphedema, not elsewhere classified 01/16/2017 Yes Inactive Problems Resolved Problems ICD-10 Code Description Active Date Resolved Date L03.116 Cellulitis of left lower limb 01/02/2017 01/02/2017 Electronic Signature(s) Signed: 01/16/2017 2:20:10 PM By: Laura Fudge MD, FACS Previous Signature: 01/16/2017 2:16:01 PM Version By: Laura Fudge MD, FACS Entered By: Laura [name] on 01/16/2017 14:20:10 Laura [name], [name] ([id number]) -------------------------------------------------------------------------------- Progress Note Details Patient Name: Wolman, Jimi F. Date of Service: 01/16/2017 1:30 PM Medical Record Number: [id number] Patient Account Number: [id number] Date of Birth/Sex: 1925-05-09 (83 y.o. Female) Treating RN: Laura [name] Primary Care Provider: Halina [name] Other Clinician: Referring Provider: Halina [name] Treating Provider/Extender: Laura [name] in  Treatment: 2 Subjective Chief Complaint Information obtained from Patient Left lower leg cellulitis History of Present Illness (HPI) 12/30/16 Patient presents today for initial evaluation concerning an area over her left lateral ankle which she tells me has been intermittent in nature since 2000. However the current opening has been present for about two weeks. She has been tolerating the dressing changes at home with antibiotic ointment but that is really the only treatment that has been initiated at this point. That is other than the oral antibiotics which was Keflex that patient was placed on by her primary care provider prior to being referred to [place]. She does have some discomfort but rates this to be around a 2-3 out of 10 fortunately the redness does not seem to be spreading to any other location as far as  her lower extremity is concerned. She does tell me that in the past antibiotics have been beneficial for her unfortunately the current antibiotics have not been of benefit and no wound culture was performed as of yet. She has previously seen Dr. 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 She does have diabetes and are most recent blood sugars have run between 104 and 106 according to patient's although her last hemoglobin A1c she is unaware of. Patient's current wound does not appear to be significantly open but is rather more of a weeping region of cellulitis. Objective Constitutional Pulse regular. Respirations normal and unlabored. Afebrile. Vitals Time Taken: 1:47 PM, Height: 63 in, Weight: 164.8 lbs, BMI: 29.2, Temperature: 98.3 F, Pulse: 76 bpm, Respiratory Rate: 16 breaths/min, Blood Pressure: 163/80 mmHg. Eyes Nonicteric. Reactive to light. Laura [name], [name] ([id number]) Ears, Nose, Mouth, and  Throat Lips, teeth, and gums WNL.Marland Kitchen Moist mucosa without lesions. Neck supple and nontender. No palpable supraclavicular or cervical adenopathy. Normal sized without goiter. Respiratory WNL. No retractions.. Cardiovascular Pedal Pulses WNL. No clubbing, cyanosis or edema. Lymphatic No adneopathy. No adenopathy. No adenopathy. Musculoskeletal Adexa without tenderness or enlargement.. Digits and nails w/o clubbing, cyanosis, infection, petechiae, ischemia, or inflammatory conditions.Marland Kitchen Psychiatric Judgement and insight Intact.. No evidence of depression, anxiety, or agitation.. General Notes: patient continues to have significant amount of lymphedema and is unable to get this reduced by just elevation. The wound is superficial and there is minimal excoriation around it. No sharp debridement was required today. Integumentary (Hair, Skin) No suspicious lesions. No crepitus or fluctuance. No peri-wound warmth or erythema. No masses.. Wound #1 status is Open. Original cause of wound was Gradually Appeared. The wound is located on the Left,Lateral Lower Leg. The wound measures 1.9cm length x 1cm width x 0.1cm depth; 1.492cm^2 area and 0.149cm^3 volume. There is Fat Layer (Subcutaneous Tissue) Exposed exposed. There is no tunneling or undermining noted. There is a large amount of serous drainage noted. The wound margin is flat and intact. There is medium (34-66%) granulation within the wound bed. There is a medium (34-66%) amount of necrotic tissue within the wound bed including Adherent Slough. The periwound skin appearance exhibited: Excoriation, Maceration. The periwound skin appearance did not exhibit: Callus, Crepitus, Induration, Rash, Scarring, Dry/Scaly, Atrophie Blanche, Cyanosis, Ecchymosis, Hemosiderin Staining, Mottled, Pallor, Rubor, Erythema. Periwound temperature was noted as No Abnormality. Assessment Active Problems ICD-10 E11.622 - Type 2 diabetes mellitus with other skin  ulcer Laura [name], [name] ([id number]) I10 - Essential (primary) hypertension I89.0 - Lymphedema, not elsewhere classified Plan Wound Cleansing: Wound #1 Left,Lateral Lower Leg: Clean wound with Normal Saline. Anesthetic: Wound #1 Left,Lateral Lower Leg: Topical Lidocaine 4% cream applied to wound bed prior to debridement Primary Wound Dressing: Wound #1 Left,Lateral Lower Leg: Aquacel Ag Secondary Dressing: Wound #1 Left,Lateral Lower Leg: ABD pad Conform/Kerlix Drawtex Other - stretch netting #4 Dressing Change Frequency: Wound #1 Left,Lateral Lower Leg: Change dressing every other day. Follow-up Appointments: Wound #1 Left,Lateral Lower Leg: Return Appointment in 1 week. Edema Control: Wound #1 Left,Lateral Lower Leg: Elevate legs to the level of the heart and pump ankles as often as possible Additional Orders / Instructions: Wound #1 Left,Lateral Lower Leg: Increase protein intake. The patient is continuing to have a lot of lymphedema and the wound is a bit macerated on review today.  After review today I have recommended: 1. Elevation and exercise which is very important for her to reduce her lymphedema as she cannot wear any compression wraps 2. Silver alginate, Drawtex and a light kerlix wrap to hold this in place Laura [name], [name] ([id number]) 3. Appropriate proteins, vitamin A, vitamin C and zinc 4. Regular visits the wound center Electronic Signature(s) Signed: 01/16/2017 2:20:29 PM By: Laura Fudge MD, FACS Previous Signature: 01/16/2017 2:19:18 PM Version By: Laura Fudge MD, FACS Entered By: Laura [name] on 01/16/2017 14:20:28 Laura [name], [name] ([id number]) -------------------------------------------------------------------------------- SuperBill Details Patient Name: Laura [name], [name] Date of Service: 01/16/2017 Medical Record Number: [id number] Patient Account Number: [id number] Date of Birth/Sex: 06/09/1925 (83 y.o. Female) Treating RN: Laura [name] Primary Care Provider:  Halina [name] Other Clinician: Referring Provider: Halina [name] Treating Provider/Extender: Laura [name] in Treatment: 2 Diagnosis Coding ICD-10 Codes Code Description [phone] Type 2 diabetes mellitus with other skin ulcer I10 Essential (primary) hypertension I89.0 Lymphedema, not elsewhere classified Facility Procedures CPT4 Code: [id number] Description: 99213 - WOUND CARE VISIT-LEV 3 EST PT Modifier: Quantity: 1 Physician Procedures CPT4 Code: [id number] Description: 12224 - WC PHYS LEVEL 3 - EST PT ICD-10 Description Diagnosis E11.622 Type 2 diabetes mellitus with other skin ulcer I89.0 Lymphedema, not elsewhere classified I10 Essential (primary) hypertension Modifier: Quantity: 1 Electronic Signature(s) Signed: 01/16/2017 4:12:07 PM By: Laura Fudge MD, FACS Signed: 01/16/2017 5:05:30 PM By: Alric Quan Previous Signature: 01/16/2017 2:20:58 PM Version By: Laura Fudge MD, FACS Previous Signature: 01/16/2017 2:19:46 PM Version By: Laura Fudge MD, FACS Entered By: Alric Quan on 01/16/2017 15:06:19

## 2017-01-17 NOTE — Progress Notes (Signed)
TALLMAN, Denelda F. ([id number]) Visit Report for 01/16/2017 Arrival Information Details Patient Name: GRIFFITTS, [name] Date of Service: 01/16/2017 1:30 PM Medical Record Number: [id number] Patient Account Number: [id number] Date of Birth/Sex: 10-28-1924 (83 y.o. Female) Treating RN: Carolyne Fiscal, Debi Primary Care Kendel Bessey: Halina Maidens Other Clinician: Referring Andrya Roppolo: Halina Maidens Treating Viviane Semidey/Extender: Frann Rider in Treatment: 2 Visit Information History Since Last Visit All ordered tests and consults were completed: No Patient Arrived: Kasandra Knudsen Added or deleted any medications: No Arrival Time: 13:40 Any new allergies or adverse reactions: No Accompanied By: friend Had a fall or experienced change in No Transfer Assistance: EasyPivot activities of daily living that may affect Patient Lift risk of falls: Patient Identification Verified: Yes Signs or symptoms of abuse/neglect since last No Secondary Verification Process Yes visito Completed: Hospitalized since last visit: No Patient Requires Transmission- No Has Dressing in Place as Prescribed: Yes Based Precautions: Pain Present Now: No Patient Has Alerts: Yes Patient Alerts: DM II Electronic Signature(s) Signed: 01/16/2017 5:05:30 PM By: Alric Quan Entered By: Alric Quan on 01/16/2017 13:42:48 Dobbins, Leasa F. ([id number]) -------------------------------------------------------------------------------- Clinic Level of Care Assessment Details Patient Name: Jon, Kalli F. Date of Service: 01/16/2017 1:30 PM Medical Record Number: [id number] Patient Account Number: [id number] Date of Birth/Sex: [birth year] (83 y.o. Female) Treating RN: Carolyne Fiscal, Debi Primary Care Lynnette Pote: Halina Maidens Other Clinician: Referring Jaquitta Dupriest: Halina Maidens Treating Jaiquan Temme/Extender: Frann Rider in Treatment: 2 Clinic Level of Care Assessment Items TOOL 4 Quantity Score X - Use when only an EandM is performed on  FOLLOW-UP visit 1 0 ASSESSMENTS - Nursing Assessment / Reassessment X - Reassessment of Co-morbidities (includes updates in patient status) 1 10 X - Reassessment of Adherence to Treatment Plan 1 5 ASSESSMENTS - Wound and Skin Assessment / Reassessment X - Simple Wound Assessment / Reassessment - one wound 1 5 []  - Complex Wound Assessment / Reassessment - multiple wounds 0 []  - Dermatologic / Skin Assessment (not related to wound area) 0 ASSESSMENTS - Focused Assessment []  - Circumferential Edema Measurements - multi extremities 0 []  - Nutritional Assessment / Counseling / Intervention 0 []  - Lower Extremity Assessment (monofilament, tuning fork, pulses) 0 []  - Peripheral Arterial Disease Assessment (using hand held doppler) 0 ASSESSMENTS - Ostomy and/or Continence Assessment and Care []  - Incontinence Assessment and Management 0 []  - Ostomy Care Assessment and Management (repouching, etc.) 0 PROCESS - Coordination of Care []  - Simple Patient / Family Education for ongoing care 0 X - Complex (extensive) Patient / Family Education for ongoing care 1 20 X - Staff obtains Programmer, systems, Records, Test Results / Process Orders 1 10 []  - Staff telephones HHA, Nursing Homes / Clarify orders / etc 0 []  - Routine Transfer to another Facility (non-emergent condition) 0 Panjwani, Lacrecia F. ([id number]) []  - Routine Hospital Admission (non-emergent condition) 0 []  - New Admissions / Biomedical engineer / Ordering NPWT, Apligraf, etc. 0 []  - Emergency Hospital Admission (emergent condition) 0 X - Simple Discharge Coordination 1 10 []  - Complex (extensive) Discharge Coordination 0 PROCESS - Special Needs []  - Pediatric / Minor Patient Management 0 []  - Isolation Patient Management 0 []  - Hearing / Language / Visual special needs 0 []  - Assessment of Community assistance (transportation, D/C planning, etc.) 0 []  - Additional assistance / Altered mentation 0 []  - Support Surface(s) Assessment (bed,  cushion, seat, etc.) 0 INTERVENTIONS - Wound Cleansing / Measurement X - Simple Wound Cleansing - one wound 1 5 []  - Complex Wound Cleansing - multiple  wounds 0 X - Wound Imaging (photographs - any number of wounds) 1 5 []  - Wound Tracing (instead of photographs) 0 X - Simple Wound Measurement - one wound 1 5 []  - Complex Wound Measurement - multiple wounds 0 INTERVENTIONS - Wound Dressings X - Small Wound Dressing one or multiple wounds 1 10 []  - Medium Wound Dressing one or multiple wounds 0 []  - Large Wound Dressing one or multiple wounds 0 X - Application of Medications - topical 1 5 []  - Application of Medications - injection 0 INTERVENTIONS - Miscellaneous []  - External ear exam 0 Petruzzi, Rayssa F. ([id number]) []  - Specimen Collection (cultures, biopsies, blood, body fluids, etc.) 0 []  - Specimen(s) / Culture(s) sent or taken to Lab for analysis 0 []  - Patient Transfer (multiple staff / Harrel Lemon Lift / Similar devices) 0 []  - Simple Staple / Suture removal (25 or less) 0 []  - Complex Staple / Suture removal (26 or more) 0 []  - Hypo / Hyperglycemic Management (close monitor of Blood Glucose) 0 []  - Ankle / Brachial Index (ABI) - do not check if billed separately 0 X - Vital Signs 1 5 Has the patient been seen at the hospital within the last three years: Yes Total Score: 95 Level Of Care: New/Established - Level 3 Electronic Signature(s) Signed: 01/16/2017 5:05:30 PM By: Alric Quan Entered By: Alric Quan on 01/16/2017 15:06:10 Mcmaster, Amri F. ([id number]) -------------------------------------------------------------------------------- Encounter Discharge Information Details Patient Name: Schoenbeck, Tifini F. Date of Service: 01/16/2017 1:30 PM Medical Record Number: [id number] Patient Account Number: [id number] Date of Birth/Sex: [birth year] (83 y.o. Female) Treating RN: Carolyne Fiscal, Debi Primary Care Celeste Tavenner: Halina Maidens Other Clinician: Referring Tiler Brandis: Halina Maidens Treating Kaileia Flow/Extender: Frann Rider in Treatment: 2 Encounter Discharge Information Items Discharge Pain Level: 0 Discharge Condition: Stable Ambulatory Status: Cane Discharge Destination: Home Transportation: Private Auto Accompanied By: friend Schedule Follow-up Appointment: Yes Medication Reconciliation completed No and provided to Patient/Care Parthenia Tellefsen: Provided on Clinical Summary of Care: 01/16/2017 Form Type Recipient Paper Patient [facility] Electronic Signature(s) Signed: 01/16/2017 2:11:52 PM By: Ruthine Dose Entered By: Ruthine Dose on 01/16/2017 14:11:52 Calma, Archita F. ([id number]) -------------------------------------------------------------------------------- Lower Extremity Assessment Details Patient Name: Holian, Normalee F. Date of Service: 01/16/2017 1:30 PM Medical Record Number: [id number] Patient Account Number: [id number] Date of Birth/Sex: 13-Aug-1924 (83 y.o. Female) Treating RN: Carolyne Fiscal, Debi Primary Care Chantille Navarrete: Halina Maidens Other Clinician: Referring Shreyansh Tiffany: Halina Maidens Treating Charlie Seda/Extender: Frann Rider in Treatment: 2 Edema Assessment Assessed: Shirlyn Goltz: No] [Right: No] E[Left: dema] [Right: :] Calf Left: Right: Point of Measurement: 27 cm From Medial Instep 34.4 cm cm Ankle Left: Right: Point of Measurement: 10 cm From Medial Instep 23 cm cm Vascular Assessment Pulses: Dorsalis Pedis Palpable: [Left:Yes] Posterior Tibial Extremity colors, hair growth, and conditions: Extremity Color: [Left:Red] Temperature of Extremity: [Left:Warm] Capillary Refill: [Left:> 3 seconds] Notes heel to knee- 48cm Electronic Signature(s) Signed: 01/16/2017 5:05:30 PM By: Alric Quan Entered By: Alric Quan on 01/16/2017 14:16:48 Halderman, Amey F. ([id number]) -------------------------------------------------------------------------------- Multi Wound Chart Details Patient Name: Lempke, Akeela F. Date of Service: 01/16/2017 1:30  PM Medical Record Number: [id number] Patient Account Number: [id number] Date of Birth/Sex: 01-25-1925 (83 y.o. Female) Treating RN: Ahmed Prima Primary Care Jazmon Kos: Halina Maidens Other Clinician: Referring Cambren Helm: Halina Maidens Treating Austan Nicholl/Extender: Frann Rider in Treatment: 2 Vital Signs Height(in): 63 Pulse(bpm): 76 Weight(lbs): 164.8 Blood Pressure 163/80 (mmHg): Body Mass Index(BMI): 29 Temperature(F): 98.3 Respiratory Rate 16 (breaths/min): Photos: [1:No Photos] [N/A:N/A] Wound Location: [1:Left Lower Leg - Lateral] [N/A:N/A]  Wounding Event: [1:Gradually Appeared] [N/A:N/A] Primary Etiology: [1:Diabetic Wound/Ulcer of the Lower Extremity] [N/A:N/A] Comorbid History: [1:Type II Diabetes] [N/A:N/A] Date Acquired: [1:12/16/2016] [N/A:N/A] Weeks of Treatment: [1:2] [N/A:N/A] Wound Status: [1:Open] [N/A:N/A] Measurements L x W x D 1.9x1x0.1 [N/A:N/A] (cm) Area (cm) : [1:1.492] [N/A:N/A] Volume (cm) : [1:0.149] [N/A:N/A] % Reduction in Area: [1:-51.90%] [N/A:N/A] % Reduction in Volume: -52.00% [N/A:N/A] Classification: [1:Grade 1] [N/A:N/A] Exudate Amount: [1:Large] [N/A:N/A] Exudate Type: [1:Serous] [N/A:N/A] Exudate Color: [1:amber] [N/A:N/A] Wound Margin: [1:Flat and Intact] [N/A:N/A] Granulation Amount: [1:Medium (34-66%)] [N/A:N/A] Necrotic Amount: [1:Medium (34-66%)] [N/A:N/A] Exposed Structures: [1:Fat Layer (Subcutaneous Tissue) Exposed: Yes Fascia: No Tendon: No Muscle: No Joint: No Bone: No] [N/A:N/A] Epithelialization: [1:None] [N/A:N/A] Periwound Skin Texture: Excoriation: Yes N/A N/A Induration: No Callus: No Crepitus: No Rash: No Scarring: No Periwound Skin Maceration: Yes N/A N/A Moisture: Dry/Scaly: No Periwound Skin Color: Atrophie Blanche: No N/A N/A Cyanosis: No Ecchymosis: No Erythema: No Hemosiderin Staining: No Mottled: No Pallor: No Rubor: No Temperature: No Abnormality N/A N/A Tenderness on No N/A  N/A Palpation: Wound Preparation: Ulcer Cleansing: N/A N/A Rinsed/Irrigated with Saline Topical Anesthetic Applied: Other: lidocaine 4% Treatment Notes Wound #1 (Left, Lateral Lower Leg) 1. Cleansed with: Clean wound with Normal Saline 2. Anesthetic Topical Lidocaine 4% cream to wound bed prior to debridement 4. Dressing Applied: Aquacel Ag 5. Secondary Dressing Applied ABD Pad Kerlix/Conform 7. Secured with Tape Notes drawtex, netting Electronic Signature(s) Signed: 01/16/2017 2:16:06 PM By: Christin Fudge MD, FACS Entered By: Christin Fudge on 01/16/2017 14:16:06 Perry, Chi F. ([id number]) Ake, Dyneisha F. ([id number]) -------------------------------------------------------------------------------- [place] Details Patient Name: Ponds, Elayne F. Date of Service: 01/16/2017 1:30 PM Medical Record Number: [id number] Patient Account Number: [id number] Date of Birth/Sex: 1925/06/02 (83 y.o. Female) Treating RN: Carolyne Fiscal, Debi Primary Care Harrington Jobe: Halina Maidens Other Clinician: Referring Davonda Ausley: Halina Maidens Treating Zawadi Aplin/Extender: Frann Rider in Treatment: 2 Active Inactive ` Abuse / Safety / Falls / Self Care Management Nursing Diagnoses: Potential for falls Goals: Patient will not experience any injury related to falls Date Initiated: 12/30/2016 Target Resolution Date: 04/29/2017 Goal Status: Active Interventions: Assess Activities of Daily Living upon admission and as needed Assess: immobility, friction, shearing, incontinence upon admission and as needed Notes: ` Nutrition Nursing Diagnoses: Imbalanced nutrition Impaired glucose control: actual or potential Potential for alteratiion in Nutrition/Potential for imbalanced nutrition Goals: Patient/caregiver will maintain therapeutic glucose control Date Initiated: 12/30/2016 Target Resolution Date: 04/29/2017 Goal Status: Active Interventions: Assess patient nutrition upon  admission and as needed per policy Notes: ` Orientation to the [place], [name] ([id number]) Nursing Diagnoses: Knowledge deficit related to the wound healing center program Goals: Patient/caregiver will verbalize understanding of the [place] Date Initiated: 12/30/2016 Target Resolution Date: 01/21/2017 Goal Status: Active Interventions: Provide education on orientation to the wound center Notes: ` Pain, Acute or Chronic Nursing Diagnoses: Pain, acute or chronic: actual or potential Potential alteration in comfort, pain Goals: Patient/caregiver will verbalize adequate pain control between visits Date Initiated: 12/30/2016 Target Resolution Date: 04/29/2017 Goal Status: Active Interventions: Complete pain assessment as per visit requirements Notes: ` Wound/Skin Impairment Nursing Diagnoses: Impaired tissue integrity Knowledge deficit related to smoking impact on wound healing Knowledge deficit related to ulceration/compromised skin integrity Goals: Ulcer/skin breakdown will have a volume reduction of 80% by week 12 Date Initiated: 12/30/2016 Target Resolution Date: 04/22/2017 Goal Status: Active Interventions: Assess patient/caregiver ability to perform ulcer/skin care regimen upon admission and as needed Notes: Goree, Temisha F. ([id number]) Electronic Signature(s) Signed: 01/16/2017  5:05:30 PM By: Alric Quan Entered By: Alric Quan on 01/16/2017 13:53:51 Paradis, Evaline F. ([id number]) -------------------------------------------------------------------------------- Pain Assessment Details Patient Name: Macht, Breanna F. Date of Service: 01/16/2017 1:30 PM Medical Record Number: [id number] Patient Account Number: [id number] Date of Birth/Sex: [birth year] (83 y.o. Female) Treating RN: Carolyne Fiscal, Debi Primary Care Devantae Babe: Halina Maidens Other Clinician: Referring Aliene Tamura: Halina Maidens Treating Jacorion Klem/Extender: Frann Rider in  Treatment: 2 Active Problems Location of Pain Severity and Description of Pain Patient Has Paino No Site Locations With Dressing Change: No Pain Management and Medication Current Pain Management: Electronic Signature(s) Signed: 01/16/2017 5:05:30 PM By: Alric Quan Entered By: Alric Quan on 01/16/2017 13:42:55 Ascencio, Fredda F. ([id number]) -------------------------------------------------------------------------------- Patient/Caregiver Education Details Patient Name: [name], Raseel F. Date of Service: 01/16/2017 1:30 PM Medical Record Number: [id number] Patient Account Number: [id number] Date of Birth/Gender: January 08, 1925 (83 y.o. Female) Treating RN: Carolyne Fiscal, Debi Primary Care Physician: Halina Maidens Other Clinician: Referring Physician: Halina Maidens Treating Physician/Extender: Frann Rider in Treatment: 2 Education Assessment Education Provided To: Patient Education Topics Provided Wound/Skin Impairment: Handouts: Other: change dressing as ordered Methods: Demonstration, Explain/Verbal Responses: State content correctly Electronic Signature(s) Signed: 01/16/2017 5:05:30 PM By: Alric Quan Entered By: Alric Quan on 01/16/2017 14:05:17 Fullington, Neeti F. ([id number]) -------------------------------------------------------------------------------- Wound Assessment Details Patient Name: Toothman, Annalei F. Date of Service: 01/16/2017 1:30 PM Medical Record Number: [id number] Patient Account Number: [id number] Date of Birth/Sex: 09/15/1924 (83 y.o. Female) Treating RN: Carolyne Fiscal, Debi Primary Care Paydon Carll: Halina Maidens Other Clinician: Referring Arrow Tomko: Halina Maidens Treating Cruzita [name]/Extender: Frann Rider in Treatment: 2 Wound Status Wound Number: 1 Primary Diabetic Wound/Ulcer of the Lower Etiology: Extremity Wound Location: Left Lower Leg - Lateral Wound Status: Open Wounding Event: Gradually Appeared Comorbid Type II Diabetes Date  Acquired: 12/16/2016 History: Weeks Of Treatment: 2 Clustered Wound: No Photos Photo Uploaded By: Alric Quan on 01/16/2017 16:47:54 Wound Measurements Length: (cm) 1.9 Width: (cm) 1 Depth: (cm) 0.1 Area: (cm) 1.492 Volume: (cm) 0.149 % Reduction in Area: -51.9% % Reduction in Volume: -52% Epithelialization: None Tunneling: No Undermining: No Wound Description Classification: Grade 1 Wound Margin: Flat and Intact Exudate Amount: Large Exudate Type: Serous Exudate Color: amber Foul Odor After Cleansing: No Slough/Fibrino Yes Wound Bed Granulation Amount: Medium (34-66%) Exposed Structure Necrotic Amount: Medium (34-66%) Fascia Exposed: No Necrotic Quality: Adherent Slough Fat Layer (Subcutaneous Tissue) Exposed: Yes Tendon Exposed: No Maharaj, Khyler F. ([id number]) Muscle Exposed: No Joint Exposed: No Bone Exposed: No Periwound Skin Texture Texture Color No Abnormalities Noted: No No Abnormalities Noted: No Callus: No Atrophie Blanche: No Crepitus: No Cyanosis: No Excoriation: Yes Ecchymosis: No Induration: No Erythema: No Rash: No Hemosiderin Staining: No Scarring: No Mottled: No Pallor: No Moisture Rubor: No No Abnormalities Noted: No Dry / Scaly: No Temperature / Pain Maceration: Yes Temperature: No Abnormality Wound Preparation Ulcer Cleansing: Rinsed/Irrigated with Saline Topical Anesthetic Applied: Other: lidocaine 4%, Treatment Notes Wound #1 (Left, Lateral Lower Leg) 1. Cleansed with: Clean wound with Normal Saline 2. Anesthetic Topical Lidocaine 4% cream to wound bed prior to debridement 4. Dressing Applied: Aquacel Ag 5. Secondary Dressing Applied ABD Pad Kerlix/Conform 7. Secured with Tape Notes drawtex, netting Electronic Signature(s) Signed: 01/16/2017 5:05:30 PM By: Alric Quan Entered By: Alric Quan on 01/16/2017 13:49:59 [place], Guila F.  ([id number]) -------------------------------------------------------------------------------- Vitals Details Patient Name: Nanez, Aisha F. Date of Service: 01/16/2017 1:30 PM Medical Record Number: [id number] Patient Account Number: [id number] Date of Birth/Sex: July 13, 1924 (83 y.o. Female) Treating RN: Carolyne Fiscal, Debi Primary Care Amber Guthridge: Halina Maidens Other Clinician:  Referring Reba Hulett: Halina Maidens Treating Belal Scallon/Extender: Frann Rider in Treatment: 2 Vital Signs Time Taken: 13:47 Temperature (F): 98.3 Height (in): 63 Pulse (bpm): 76 Weight (lbs): 164.8 Respiratory Rate (breaths/min): 16 Body Mass Index (BMI): 29.2 Blood Pressure (mmHg): 163/80 Reference Range: 80 - 120 mg / dl Electronic Signature(s) Signed: 01/16/2017 5:05:30 PM By: Alric Quan Entered By: Alric Quan on 01/16/2017 13:48:42

## 2017-01-18 LAB — HM DIABETES EYE EXAM

## 2017-01-27 ENCOUNTER — Encounter: Payer: Medicare PPO | Attending: Physician Assistant | Admitting: Physician Assistant

## 2017-01-27 DIAGNOSIS — I890 Lymphedema, not elsewhere classified: Secondary | ICD-10-CM | POA: Insufficient documentation

## 2017-01-27 DIAGNOSIS — E11622 Type 2 diabetes mellitus with other skin ulcer: Secondary | ICD-10-CM | POA: Diagnosis present

## 2017-01-27 DIAGNOSIS — Z7984 Long term (current) use of oral hypoglycemic drugs: Secondary | ICD-10-CM | POA: Diagnosis not present

## 2017-01-27 DIAGNOSIS — Z87891 Personal history of nicotine dependence: Secondary | ICD-10-CM | POA: Diagnosis not present

## 2017-01-27 DIAGNOSIS — Z888 Allergy status to other drugs, medicaments and biological substances status: Secondary | ICD-10-CM | POA: Insufficient documentation

## 2017-01-27 DIAGNOSIS — L97911 Non-pressure chronic ulcer of unspecified part of right lower leg limited to breakdown of skin: Secondary | ICD-10-CM | POA: Diagnosis not present

## 2017-01-27 DIAGNOSIS — I10 Essential (primary) hypertension: Secondary | ICD-10-CM | POA: Insufficient documentation

## 2017-01-27 DIAGNOSIS — Z880 Allergy status to penicillin: Secondary | ICD-10-CM | POA: Diagnosis not present

## 2017-01-27 DIAGNOSIS — Z89412 Acquired absence of left great toe: Secondary | ICD-10-CM | POA: Insufficient documentation

## 2017-01-29 ENCOUNTER — Other Ambulatory Visit: Payer: Self-pay | Admitting: Internal Medicine

## 2017-01-29 DIAGNOSIS — E1151 Type 2 diabetes mellitus with diabetic peripheral angiopathy without gangrene: Secondary | ICD-10-CM

## 2017-02-02 NOTE — Progress Notes (Signed)
[place], [name] ([id number]) Visit Report for 01/27/2017 Chief Complaint Document Details [name], [name] Date of Service: 01/27/2017 2:30 PM Medical Record Number: [id number] Patient Account Number: [id number] Date of Birth/Sex: 09-09-1924 (83 y.o. Female) Treating RN: Ahmed Prima Primary Care Provider: Halina Maidens Other Clinician: Referring Provider: Halina Maidens Treating Provider/Extender: Melburn Hake, Kaysen Deal Weeks in Treatment: 4 Information Obtained from: Patient Chief Complaint Left lower leg cellulitis Electronic Signature(s) Signed: 01/31/2017 4:17:52 PM By: Gretta Cool, BSN, RN, CWS, Kim RN, BSN Signed: 02/01/2017 1:25:22 AM By: Worthy Keeler PA-C Previous Signature: 01/30/2017 8:34:49 AM Version By: Worthy Keeler PA-C Entered By: Gretta Cool BSN, RN, CWS, Kim on 01/31/2017 11:43:46 [name], [name] -------------------------------------------------------------------------------- HPI Details Patient Name: [name], [name] Date of Service: 01/27/2017 2:30 PM Medical Record Number: [id number] Patient Account Number: [id number] Date of Birth/Sex: 06/12/1925 (83 y.o. Female) Treating RN: Carolyne Fiscal, Debi Primary Care Provider: Halina Maidens Other Clinician: Referring Provider: Halina Maidens Treating Provider/Extender: Melburn Hake, Abbigayle Toole Weeks in Treatment: 4 History of Present Illness HPI Description: 12/30/16 Patient presents today for initial evaluation concerning an area over her left lateral ankle which she tells me has been intermittent in nature since 2000. However the current opening has been present for about two weeks. She has been tolerating the dressing changes at home with antibiotic ointment but that is really the only treatment that has been initiated at this point. That is other than the oral antibiotics which was Keflex that patient was placed on by her primary care provider prior to being referred to [place]. She does have some discomfort but rates this to be  around a 2-3 out of 10 fortunately the redness does not seem to be spreading to any other location as far as her lower extremity is concerned. She does tell me that in the past antibiotics have been beneficial for her unfortunately the current antibiotics have not been of benefit and no wound culture was performed as of yet. She has previously seen Dr. 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 She does have diabetes and are most recent blood sugars have run between 104 and 106 according to patient's although her last hemoglobin A1c she is unaware of. Patient's current wound does not appear to be significantly open but is rather more of a weeping region of cellulitis. 01/27/17 on evaluation today patient left lower for many wound continues to show signs of erythema surrounding and in fact she is noted to have erythema from her toes up to just below the knee in regard to left lower extremity. She is not having any fevers, chills, nausea, vomiting, or diarrhea at this point. With that being said she is concerned about the silver alginate dressing. She tells me that in the past she used silver and some of the dressings for previous one and did not do well with it. Nonetheless she has discomfort along the wound itself but no other pain noted throughout the left lower extremity. Electronic Signature(s) Signed: 01/31/2017 4:17:52 PM By: Gretta Cool, BSN, RN, CWS, Kim RN, BSN Signed: 02/01/2017 1:25:22 AM By: Worthy Keeler PA-C Previous Signature: 01/30/2017 8:34:49 AM Version By: Worthy Keeler PA-C Entered By: Gretta Cool BSN, RN, CWS, Kim on 01/31/2017 11:43:55 [name], [name] -------------------------------------------------------------------------------- Physical Exam Details Patient Name: [name], [name] Date of Service:  01/27/2017  2:30 PM Medical Record Number: [id number] Patient Account Number: [id number] Date of Birth/Sex: 1924-09-02 (83 y.o. Female) Treating RN: Ahmed Prima Primary Care Provider: Halina Maidens Other Clinician: Referring Provider: Halina Maidens Treating Provider/Extender: STONE III, Maquita Sandoval Weeks in Treatment: 4 Constitutional Well-nourished and well-hydrated in no acute distress. Respiratory normal breathing without difficulty. clear to auscultation bilaterally. Cardiovascular regular rate and rhythm with normal S1, S2. 1+ pitting edema of the left lower extremities. Psychiatric this patient is able to make decisions and demonstrates good insight into disease process. Alert and Oriented x 3. pleasant and cooperative. Notes Patient's wound does appear so erythema surrounding in fact again she has erythema from the basement is to just below the knees. There is serous drainage noted during evaluation today previous wound culture revealed no specific organisms. Electronic Signature(s) Signed: 01/31/2017 4:17:52 PM By: Gretta Cool, BSN, RN, CWS, Kim RN, BSN Signed: 02/01/2017 1:25:22 AM By: Worthy Keeler PA-C Previous Signature: 01/30/2017 8:34:49 AM Version By: Worthy Keeler PA-C Entered By: Gretta Cool BSN, RN, CWS, Kim on 01/31/2017 11:44:15 [name], [name] -------------------------------------------------------------------------------- Physician Orders Details Patient Name: Laura Glatter, [name] Date of Service: 01/27/2017 2:30 PM Medical Record Number: [id number] Patient Account Number: [id number] Date of Birth/Sex: 1924-07-10 (83 y.o. Female) Treating RN: Carolyne Fiscal, Debi Primary Care Provider: Halina Maidens Other Clinician: Referring Provider: Halina Maidens Treating Provider/Extender: Melburn Hake, Yardley Lekas Weeks in Treatment: 4 Verbal / Phone Orders: Yes Clinician: Carolyne Fiscal, Debi Read Back and Verified: Yes Diagnosis Coding ICD-10 Coding Code Description E11.622 Type 2 diabetes  mellitus with other skin ulcer I10 Essential (primary) hypertension I89.0 Lymphedema, not elsewhere classified Wound Cleansing Wound #1 Left,Lateral Lower Leg o Clean wound with Normal Saline. Anesthetic Wound #1 Left,Lateral Lower Leg o Topical Lidocaine 4% cream applied to wound bed prior to debridement Primary Wound Dressing Wound #1 Left,Lateral Lower Leg o Gentamicin Sulfate Cream o Aquacel Secondary Dressing Wound #1 Left,Lateral Lower Leg o Dry Gauze o Boardered Foam Dressing Dressing Change Frequency Wound #1 Left,Lateral Lower Leg o Change dressing every day. Follow-up Appointments Wound #1 Left,Lateral Lower Leg o Return Appointment in 1 week. Edema Control Wound #1 Left,Lateral Lower Leg [name], [name] ([id number]) o Elevate legs to the level of the heart and pump ankles as often as possible Additional Orders / Instructions Wound #1 Left,Lateral Lower Leg o Increase protein intake. Patient Medications Allergies: Augmentin, calcium channel blockers, nitrofurantoin, ACE Inhibitors, atorvastatin, Beta-Blockers (Beta-Adrenergic Blocking Agts), Levaquin, pravastatin, Statins-Hmg-Coa Reductase Inhibitors, sulfa, clindamycin, lincomycin Notifications Medication Indication Start End doxycycline monohydrate 01/27/2017 DOSE oral 100 mg capsule - capsule oral take 1 capsule every 12 hours for 10 days Notes I'm going to recommend that we switch back to the gentamicin at this point due to the fact that patient is not very happy with the silver alginate dressing. We will cover with an alginate dressing no silver to help with fluid management. I'm also going to place her on doxycycline which I will soon into the pharmacy today. I do not see any obvious evidence that she is allergic to doxycycline at least that I'm aware of and we will see were things stand in one week we see her for reevaluation. If anything worsens I did advise her that she will need to go to  the ER for further evaluation and possible IV therapy. Electronic Signature(s) Signed: 01/31/2017 4:17:52 PM By: Gretta Cool, BSN, RN, CWS, Kim RN, BSN Signed: 02/01/2017 1:25:22 AM By: Worthy Keeler PA-C Previous Signature: 01/27/2017 4:55:46 PM Version By: Worthy Keeler  PA-C Previous Signature: 01/27/2017 4:51:41 PM Version By: Alric Quan Entered By: Gretta Cool BSN, RN, CWS, Kim on 01/31/2017 11:42:45 [name], [name] ([id number]) -------------------------------------------------------------------------------- Problem List Details Patient Name: Marcon, Catha F. Date of Service: 01/27/2017 2:30 PM Medical Record Number: [id number] Patient Account Number: [id number] Date of Birth/Sex: 05/28/1925 (83 y.o. Female) Treating RN: Carolyne Fiscal, Debi Primary Care Provider: Halina Maidens Other Clinician: Referring Provider: Halina Maidens Treating Provider/Extender: Melburn Hake, Merlon Alcorta Weeks in Treatment: 4 Active Problems ICD-10 Encounter Code Description Active Date Diagnosis E11.622 Type 2 diabetes mellitus with other skin ulcer 01/02/2017 Yes L03.116 Cellulitis of left lower limb 01/02/2017 Yes I10 Essential (primary) hypertension 01/02/2017 Yes I89.0 Lymphedema, not elsewhere classified 01/16/2017 Yes Inactive Problems Resolved Problems Electronic Signature(s) Signed: 01/31/2017 4:17:52 PM By: Gretta Cool, BSN, RN, CWS, Kim RN, BSN Signed: 02/01/2017 1:25:22 AM By: Worthy Keeler PA-C Previous Signature: 01/30/2017 8:34:49 AM Version By: Worthy Keeler PA-C Entered By: Gretta Cool, BSN, RN, CWS, Kim on 01/31/2017 11:43:27 [name], [name] ([id number]) -------------------------------------------------------------------------------- Progress Note Details [name], [name] Date of Service: 01/27/2017 2:30 PM Medical Record Number: [id number] Patient Account Number: [id number] Date of Birth/Sex: [birth year] (83 y.o. Female) Treating RN: Carolyne Fiscal, Debi Primary Care Provider: Halina Maidens Other  Clinician: Referring Provider: Halina Maidens Treating Provider/Extender: Melburn Hake, Alekxander Isola Weeks in Treatment: 4 Subjective Chief Complaint Information obtained from Patient Left lower leg cellulitis History of Present Illness (HPI) 12/30/16 Patient presents today for initial evaluation concerning an area over her left lateral ankle which she tells me has been intermittent in nature since 2000. However the current opening has been present for about two weeks. She has been tolerating the dressing changes at home with antibiotic ointment but that is really the only treatment that has been initiated at this point. That is other than the oral antibiotics which was Keflex that patient was placed on by her primary care provider prior to being referred to [place]. She does have some discomfort but rates this to be around a 2-3 out of 10 fortunately the redness does not seem to be spreading to any other location as far as her lower extremity is concerned. She does tell me that in the past antibiotics have been beneficial for her unfortunately the current antibiotics have not been of benefit and no wound culture was performed as of yet. She has previously seen Dr. 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 She does have diabetes and are most recent blood sugars have run between 104 and 106 according to patient's although her last hemoglobin A1c she is unaware of. Patient's current wound does not appear to be significantly open but is rather more of a weeping region of cellulitis. 01/27/17 on evaluation today patient left lower for many wound continues to show signs of erythema surrounding and in fact she is noted to have erythema from her toes up to just below the knee in regard to left lower extremity. She is not having any fevers,  chills, nausea, vomiting, or diarrhea at this point. With that being said she is concerned about the silver alginate dressing. She tells me that in the past she used silver and some of the dressings for previous one and did not do well with it. Nonetheless she has discomfort along the wound itself but no other pain noted throughout the left lower  extremity. Objective Constitutional Wildes, Aune F. ([id number]) Well-nourished and well-hydrated in no acute distress. Vitals Time Taken: 2:46 PM, Height: 63 in, Weight: 164.8 lbs, BMI: 29.2, Temperature: 98.2 F, Pulse: 71 bpm, Respiratory Rate: 16 breaths/min, Blood Pressure: 133/98 mmHg. Respiratory normal breathing without difficulty. clear to auscultation bilaterally. Cardiovascular regular rate and rhythm with normal S1, S2. 1+ pitting edema of the left lower extremities. Psychiatric this patient is able to make decisions and demonstrates good insight into disease process. Alert and Oriented x 3. pleasant and cooperative. General Notes: Patient's wound does appear so erythema surrounding in fact again she has erythema from the basement is to just below the knees. There is serous drainage noted during evaluation today previous wound culture revealed no specific organisms. Integumentary (Hair, Skin) Wound #1 status is Open. Original cause of wound was Gradually Appeared. The wound is located on the Left,Lateral Lower Leg. The wound measures 5cm length x 3cm width x 0.1cm depth; 11.781cm^2 area and 1.178cm^3 volume. There is Fat Layer (Subcutaneous Tissue) Exposed exposed. There is no tunneling or undermining noted. There is a large amount of serous drainage noted. The wound margin is flat and intact. There is medium (34-66%) granulation within the wound bed. There is a medium (34-66%) amount of necrotic tissue within the wound bed including Adherent Slough. The periwound skin appearance exhibited: Excoriation, Maceration, Erythema. The periwound  skin appearance did not exhibit: Callus, Crepitus, Induration, Rash, Scarring, Dry/Scaly, Atrophie Blanche, Cyanosis, Ecchymosis, Hemosiderin Staining, Mottled, Pallor, Rubor. The surrounding wound skin color is noted with erythema which is circumferential. Periwound temperature was noted as No Abnormality. The periwound has tenderness on palpation. Assessment Active Problems ICD-10 E11.622 - Type 2 diabetes mellitus with other skin ulcer L03.116 - Cellulitis of left lower limb I10 - Essential (primary) hypertension I89.0 - Lymphedema, not elsewhere classified [name], [name] ([id number]) Plan Wound Cleansing: Wound #1 Left,Lateral Lower Leg: Clean wound with Normal Saline. Anesthetic: Wound #1 Left,Lateral Lower Leg: Topical Lidocaine 4% cream applied to wound bed prior to debridement Primary Wound Dressing: Wound #1 Left,Lateral Lower Leg: Gentamicin Sulfate Cream Aquacel Secondary Dressing: Wound #1 Left,Lateral Lower Leg: Dry Gauze Boardered Foam Dressing Dressing Change Frequency: Wound #1 Left,Lateral Lower Leg: Change dressing every day. Follow-up Appointments: Wound #1 Left,Lateral Lower Leg: Return Appointment in 1 week. Edema Control: Wound #1 Left,Lateral Lower Leg: Elevate legs to the level of the heart and pump ankles as often as possible Additional Orders / Instructions: Wound #1 Left,Lateral Lower Leg: Increase protein intake. The following medication(s) was prescribed: doxycycline monohydrate oral 100 mg capsule capsule oral take 1 capsule every 12 hours for 10 days starting 01/27/2017 General Notes: I'm going to recommend that we switch back to the gentamicin at this point due to the fact that patient is not very happy with the silver alginate dressing. We will cover with an alginate dressing no silver to help with fluid management. I'm also going to place her on doxycycline which I will soon into the pharmacy today. I do not see any obvious evidence that she  is allergic to doxycycline at least that I'm aware of and we will see were things stand in one week we see her for reevaluation. If anything worsens I did advise her that she will need to go to the ER for further evaluation and possible IV therapy. Electronic Signature(s) Signed: 01/31/2017 4:17:52 PM By: Gretta Cool, BSN, RN, CWS, Kim RN, BSN Signed: 02/01/2017 1:25:22 AM By: Worthy Keeler PA-C Previous Signature: 01/30/2017 8:34:49 AM  Version By: Worthy Keeler PA-C Entered By: Gretta Cool, BSN, RN, CWS, Kim on 01/31/2017 11:44:51 [name], [name] ([id number]) [name], [name] ([id number]) -------------------------------------------------------------------------------- SuperBill Details [name], [name] Date of Service: 01/27/2017 Medical Record Number: [id number] Patient Account Number: [id number] Date of Birth/Sex: May 02, 1925 (83 y.o. Female) Treating RN: Carolyne Fiscal, Debi Primary Care Provider: Halina Maidens Other Clinician: Referring Provider: Halina Maidens Treating Provider/Extender: Melburn Hake, Faithe Ariola Weeks in Treatment: 4 Diagnosis Coding ICD-10 Codes Code Description E11.622 Type 2 diabetes mellitus with other skin ulcer L03.116 Cellulitis of left lower limb I10 Essential (primary) hypertension I89.0 Lymphedema, not elsewhere classified Facility Procedures CPT4 Code: [id number] Description: 99213 - WOUND CARE VISIT-LEV 3 EST PT Modifier: Quantity: 1 Physician Procedures CPT4 Code: [id number] Description: 00174 - WC PHYS LEVEL 3 - EST PT ICD-10 Description Diagnosis E11.622 Type 2 diabetes mellitus with other skin ulcer L03.116 Cellulitis of left lower limb I10 Essential (primary) hypertension I89.0 Lymphedema, not elsewhere classified Modifier: Quantity: 1 Electronic Signature(s) Signed: 01/31/2017 4:17:52 PM By: Gretta Cool, BSN, RN, CWS, Kim RN, BSN Signed: 02/01/2017 1:25:22 AM By: Worthy Keeler PA-C Previous Signature: 01/30/2017 8:34:49 AM Version By: Worthy Keeler PA-C Entered By:  Gretta Cool, BSN, RN, CWS, Kim on 01/31/2017 11:43:15

## 2017-02-02 NOTE — Progress Notes (Signed)
[name] ([id number]) Visit Report for 01/27/2017 Arrival Information Details Patient Name: [name] Date of Service: 01/27/2017 2:30 PM Medical Record Number: [id number] Patient Account Number: [id number] Date of Birth/Sex: 02/05/1925 (83 y.o. Female) Treating RN: Carolyne Fiscal, Debi Primary Care Barkley Kratochvil: Halina Maidens Other Clinician: Referring Elianny Buxbaum: Halina Maidens Treating Moe Graca/Extender: Melburn Hake, HOYT Weeks in Treatment: 4 Visit Information History Since Last Visit All ordered tests and consults were completed: No Patient Arrived: Laura [name] Added or deleted any medications: No Arrival Time: 14:45 Any new allergies or adverse reactions: No Accompanied By: friend Had a fall or experienced change in No Transfer Assistance: EasyPivot activities of daily living that may affect Patient Lift risk of falls: Patient Identification Verified: Yes Signs or symptoms of abuse/neglect since last No Secondary Verification Process Yes visito Completed: Hospitalized since last visit: No Patient Requires Transmission- No Has Dressing in Place as Prescribed: Yes Based Precautions: Pain Present Now: No Patient Has Alerts: Yes Patient Alerts: DM II Electronic Signature(s) Signed: 01/31/2017 4:17:52 PM By: Gretta Cool, BSN, RN, CWS, Kim RN, BSN Previous Signature: 01/27/2017 4:51:41 PM Version By: Alric Quan Entered By: Gretta Cool, BSN, RN, CWS, Kim on 01/31/2017 11:39:00 Laura [name] ([id number]) -------------------------------------------------------------------------------- Clinic Level of Care Assessment Details Patient Name: [name] Date of Service: 01/27/2017 2:30 PM Medical Record Number: [id number] Patient Account Number: [id number] Date of Birth/Sex: 11/14/1924 (83 y.o. Female) Treating RN: Carolyne Fiscal, Debi Primary Care Ellsworth Waldschmidt: Halina Maidens Other Clinician: Referring Averianna Brugger: Halina Maidens Treating Kellye Mizner/Extender: Melburn Hake, HOYT Weeks in Treatment: 4 Clinic  Level of Care Assessment Items TOOL 4 Quantity Score X - Use when only an EandM is performed on FOLLOW-UP visit 1 0 ASSESSMENTS - Nursing Assessment / Reassessment X - Reassessment of Co-morbidities (includes updates in patient status) 1 10 X - Reassessment of Adherence to Treatment Plan 1 5 ASSESSMENTS - Wound and Skin Assessment / Reassessment X - Simple Wound Assessment / Reassessment - one wound 1 5 []  - Complex Wound Assessment / Reassessment - multiple wounds 0 []  - Dermatologic / Skin Assessment (not related to wound area) 0 ASSESSMENTS - Focused Assessment []  - Circumferential Edema Measurements - multi extremities 0 []  - Nutritional Assessment / Counseling / Intervention 0 []  - Lower Extremity Assessment (monofilament, tuning fork, pulses) 0 []  - Peripheral Arterial Disease Assessment (using hand held doppler) 0 ASSESSMENTS - Ostomy and/or Continence Assessment and Care []  - Incontinence Assessment and Management 0 []  - Ostomy Care Assessment and Management (repouching, etc.) 0 PROCESS - Coordination of Care []  - Simple Patient / Family Education for ongoing care 0 X - Complex (extensive) Patient / Family Education for ongoing care 1 20 X - Staff obtains Programmer, systems, Records, Test Results / Process Orders 1 10 []  - Staff telephones HHA, Nursing Homes / Clarify orders / etc 0 []  - Routine Transfer to another Facility (non-emergent condition) 0 [name], [name] ([id number]) []  - Routine Hospital Admission (non-emergent condition) 0 []  - New Admissions / Biomedical engineer / Ordering NPWT, Apligraf, etc. 0 []  - Emergency Hospital Admission (emergent condition) 0 X - Simple Discharge Coordination 1 10 []  - Complex (extensive) Discharge Coordination 0 PROCESS - Special Needs []  - Pediatric / Minor Patient Management 0 []  - Isolation Patient Management 0 []  - Hearing / Language / Visual special needs 0 []  - Assessment of Community assistance (transportation, D/C planning, etc.)  0 []  - Additional assistance / Altered mentation 0 []  - Support Surface(s) Assessment (bed, cushion, seat, etc.) 0 INTERVENTIONS - Wound Cleansing /  Measurement X - Simple Wound Cleansing - one wound 1 5 []  - Complex Wound Cleansing - multiple wounds 0 X - Wound Imaging (photographs - any number of wounds) 1 5 []  - Wound Tracing (instead of photographs) 0 X - Simple Wound Measurement - one wound 1 5 []  - Complex Wound Measurement - multiple wounds 0 INTERVENTIONS - Wound Dressings X - Small Wound Dressing one or multiple wounds 1 10 []  - Medium Wound Dressing one or multiple wounds 0 []  - Large Wound Dressing one or multiple wounds 0 X - Application of Medications - topical 1 5 []  - Application of Medications - injection 0 INTERVENTIONS - Miscellaneous []  - External ear exam 0 [name] ([id number]) []  - Specimen Collection (cultures, biopsies, blood, body fluids, etc.) 0 []  - Specimen(s) / Culture(s) sent or taken to Lab for analysis 0 []  - Patient Transfer (multiple staff / Harrel Lemon Lift / Similar devices) 0 []  - Simple Staple / Suture removal (25 or less) 0 []  - Complex Staple / Suture removal (26 or more) 0 []  - Hypo / Hyperglycemic Management (close monitor of Blood Glucose) 0 []  - Ankle / Brachial Index (ABI) - do not check if billed separately 0 X - Vital Signs 1 5 Has the patient been seen at the hospital within the last three years: Yes Total Score: 95 Level Of Care: New/Established - Level 3 Electronic Signature(s) Signed: 01/31/2017 4:17:52 PM By: Gretta Cool, BSN, RN, CWS, Kim RN, BSN Previous Signature: 01/27/2017 4:51:41 PM Version By: Alric Quan Entered By: Gretta Cool, BSN, RN, CWS, Kim on 01/31/2017 11:43:03 Laura [name] ([id number]) -------------------------------------------------------------------------------- Encounter Discharge Information Details Patient Name: [name] Date of Service: 01/27/2017 2:30 PM Medical Record Number: [id number] Patient Account Number:  [id number] Date of Birth/Sex: February 22, 1925 (83 y.o. Female) Treating RN: Carolyne Fiscal, Debi Primary Care Kincade Granberg: Halina Maidens Other Clinician: Referring Nanie Dunkleberger: Halina Maidens Treating Delson Dulworth/Extender: Frann Rider in Treatment: 4 Encounter Discharge Information Items Discharge Pain Level: 0 Discharge Condition: Stable Ambulatory Status: Cane Discharge Destination: Home Private Transportation: Auto Accompanied By: friend Schedule Follow-up Appointment: Yes Medication Reconciliation completed and No provided to Patient/Care Lenola Lockner: Clinical Summary of Care: Electronic Signature(s) Signed: 01/27/2017 4:51:41 PM By: Alric Quan Entered By: Alric Quan on 01/27/2017 14:59:45 Grabinski, Cathalina [name]. ([id number]) -------------------------------------------------------------------------------- Lower Extremity Assessment Details Patient Name: Minney, Sayla [name]. Date of Service: 01/27/2017 2:30 PM Medical Record Number: [id number] Patient Account Number: [id number] Date of Birth/Sex: 08-14-1924 (83 y.o. Female) Treating RN: Carolyne Fiscal, Debi Primary Care Summit Arroyave: Halina Maidens Other Clinician: Referring Ren Aspinall: Halina Maidens Treating Deone Leifheit/Extender: Melburn Hake, HOYT Weeks in Treatment: 4 Edema Assessment Assessed: [Left: No] [Right: No] E[Left: dema] [Right: :] Calf Left: Right: Point of Measurement: 27 cm From Medial Instep 34.4 cm cm Ankle Left: Right: Point of Measurement: 10 cm From Medial Instep 23 cm cm Vascular Assessment Pulses: Dorsalis Pedis Palpable: [Left:Yes] Posterior Tibial Extremity colors, hair growth, and conditions: Extremity Color: [Left:Red] Temperature of Extremity: [Left:Warm] Capillary Refill: [Left:< 3 seconds] Electronic Signature(s) Signed: 01/31/2017 4:17:52 PM By: Gretta Cool, BSN, RN, CWS, Kim RN, BSN Signed: 01/31/2017 4:46:49 PM By: Alric Quan Previous Signature: 01/27/2017 4:51:41 PM Version By: Alric Quan Entered By:  Gretta Cool BSN, RN, CWS, Kim on 01/31/2017 11:39:36 Laura [name] ([id number]) -------------------------------------------------------------------------------- Multi Wound Chart Details Patient Name: Bonnell, Matalyn [name]. Date of Service: 01/27/2017 2:30 PM Medical Record Number: [id number] Patient Account Number: [id number] Date of Birth/Sex: [birth year] (83 y.o. Female) Treating RN: Carolyne Fiscal, Debi Primary Care Juno Alers: Halina Maidens Other Clinician:  Referring Sharion Grieves: Halina Maidens Treating Beniah Magnan/Extender: Melburn Hake, HOYT Weeks in Treatment: 4 Vital Signs Height(in): 63 Pulse(bpm): 71 Weight(lbs): 164.8 Blood Pressure 133/98 (mmHg): Body Mass Index(BMI): 29 Temperature([name]): 98.2 Respiratory Rate 16 (breaths/min): Photos: [N/A:N/A] Wound Location: Left Lower Leg - Lateral N/A N/A Wounding Event: Gradually Appeared N/A N/A Primary Etiology: Diabetic Wound/Ulcer of N/A N/A the Lower Extremity Comorbid History: Type II Diabetes N/A N/A Date Acquired: 12/16/2016 N/A N/A Weeks of Treatment: 4 N/A N/A Wound Status: Open N/A N/A Measurements L x W x D 5x3x0.1 N/A N/A (cm) Area (cm) : 11.781 N/A N/A Volume (cm) : 1.178 N/A N/A % Reduction in Area: -1099.70% N/A N/A % Reduction in Volume: -1102.00% N/A N/A Classification: Grade 1 N/A N/A Exudate Amount: Large N/A N/A Exudate Type: Serous N/A N/A Exudate Color: amber N/A N/A Wound Margin: Flat and Intact N/A N/A Granulation Amount: Medium (34-66%) N/A N/A Necrotic Amount: Medium (34-66%) N/A N/A Exposed Structures: Fat Layer (Subcutaneous N/A N/A Tissue) Exposed: Yes Laura [name] ([id number]) Fascia: No Tendon: No Muscle: No Joint: No Bone: No Epithelialization: None N/A N/A Periwound Skin Texture: Excoriation: Yes N/A N/A Induration: No Callus: No Crepitus: No Rash: No Scarring: No Periwound Skin Maceration: Yes N/A N/A Moisture: Dry/Scaly: No Periwound Skin Color: Erythema: Yes N/A N/A Atrophie Blanche:  No Cyanosis: No Ecchymosis: No Hemosiderin Staining: No Mottled: No Pallor: No Rubor: No Erythema Location: Circumferential N/A N/A Temperature: No Abnormality N/A N/A Tenderness on Yes N/A N/A Palpation: Wound Preparation: Ulcer Cleansing: N/A N/A Rinsed/Irrigated with Saline Topical Anesthetic Applied: Other: lidocaine 4% Treatment Notes Wound #1 (Left, Lateral Lower Leg) 1. Cleansed with: Clean wound with Normal Saline 2. Anesthetic Topical Lidocaine 4% cream to wound bed prior to debridement 3. Peri-wound Care: Skin Prep 4. Dressing Applied: Calcium Alginate Other dressing (specify in notes) 5. Secondary Dressing Applied Bordered Foam Dressing Dry Gauze Notes Laura [name] ([id number]) gentamicin Electronic Signature(s) Signed: 01/31/2017 4:17:52 PM By: Gretta Cool, BSN, RN, CWS, Kim RN, BSN Previous Signature: 01/27/2017 4:51:41 PM Version By: Alric Quan Entered By: Gretta Cool BSN, RN, CWS, Kim on 01/31/2017 11:42:14 Laura [name] ([id number]) -------------------------------------------------------------------------------- [place] Details Patient Name: Laura Glatter, Makinsley [name]. Date of Service: 01/27/2017 2:30 PM Medical Record Number: [id number] Patient Account Number: [id number] Date of Birth/Sex: [birth year] (83 y.o. Female) Treating RN: Carolyne Fiscal, Debi Primary Care Micah Galeno: Halina Maidens Other Clinician: Referring Faythe Heitzenrater: Halina Maidens Treating Rhemi Balbach/Extender: Melburn Hake, HOYT Weeks in Treatment: 4 Active Inactive ` Abuse / Safety / Falls / Self Care Management Nursing Diagnoses: Potential for falls Goals: Patient will not experience any injury related to falls Date Initiated: 12/30/2016 Target Resolution Date: 04/29/2017 Goal Status: Active Interventions: Assess Activities of Daily Living upon admission and as needed Assess: immobility, friction, shearing, incontinence upon admission and as needed Notes: ` Nutrition Nursing  Diagnoses: Imbalanced nutrition Impaired glucose control: actual or potential Potential for alteratiion in Nutrition/Potential for imbalanced nutrition Goals: Patient/caregiver will maintain therapeutic glucose control Date Initiated: 12/30/2016 Target Resolution Date: 04/29/2017 Goal Status: Active Interventions: Assess patient nutrition upon admission and as needed per policy Notes: ` Orientation to the [place], [name] ([id number]) Nursing Diagnoses: Knowledge deficit related to the wound healing center program Goals: Patient/caregiver will verbalize understanding of the [place] Date Initiated: 12/30/2016 Target Resolution Date: 01/21/2017 Goal Status: Active Interventions: Provide education on orientation to the wound center Notes: ` Pain, Acute or Chronic Nursing Diagnoses: Pain, acute or chronic: actual or potential Potential alteration in comfort, pain  Goals: Patient/caregiver will verbalize adequate pain control between visits Date Initiated: 12/30/2016 Target Resolution Date: 04/29/2017 Goal Status: Active Interventions: Complete pain assessment as per visit requirements Notes: ` Wound/Skin Impairment Nursing Diagnoses: Impaired tissue integrity Knowledge deficit related to smoking impact on wound healing Knowledge deficit related to ulceration/compromised skin integrity Goals: Ulcer/skin breakdown will have a volume reduction of 80% by week 12 Date Initiated: 12/30/2016 Target Resolution Date: 04/22/2017 Goal Status: Active Interventions: Assess patient/caregiver ability to perform ulcer/skin care regimen upon admission and as needed Notes: STEPHANA, MORELL ([id number]) Electronic Signature(s) Signed: 01/31/2017 4:17:52 PM By: Gretta Cool, BSN, RN, CWS, Kim RN, BSN Signed: 01/31/2017 4:46:49 PM By: Alric Quan Previous Signature: 01/27/2017 4:51:41 PM Version By: Alric Quan Entered By: Gretta Cool BSN, RN, CWS, Kim on 01/31/2017  11:40:04 Laura [name] ([id number]) -------------------------------------------------------------------------------- Pain Assessment Details Patient Name: Tensley, Adyn [name]. Date of Service: 01/27/2017 2:30 PM Medical Record Number: [id number] Patient Account Number: [id number] Date of Birth/Sex: February 26, 1925 (83 y.o. Female) Treating RN: Carolyne Fiscal, Debi Primary Care Anaysia Germer: Halina Maidens Other Clinician: Referring Oseph Imburgia: Halina Maidens Treating Kailin Leu/Extender: Melburn Hake, HOYT Weeks in Treatment: 4 Active Problems Location of Pain Severity and Description of Pain Patient Has Paino No Site Locations With Dressing Change: No Pain Management and Medication Current Pain Management: Electronic Signature(s) Signed: 01/31/2017 4:17:52 PM By: Gretta Cool, BSN, RN, CWS, Kim RN, BSN Signed: 01/31/2017 4:46:49 PM By: Alric Quan Previous Signature: 01/27/2017 4:51:41 PM Version By: Alric Quan Entered By: Gretta Cool BSN, RN, CWS, Kim on 01/31/2017 11:39:10 Grosshans, Aishah [name]. ([id number]) -------------------------------------------------------------------------------- Patient/Caregiver Education Details Patient Name: Laura Glatter, Ricky [name]. Date of Service: 01/27/2017 2:30 PM Medical Record Number: [id number] Patient Account Number: [id number] Date of Birth/Gender: 31-Dec-1924 (83 y.o. Female) Treating RN: Ahmed Prima Primary Care Physician: Halina Maidens Other Clinician: Referring Physician: Halina Maidens Treating Physician/Extender: Frann Rider in Treatment: 4 Education Assessment Education Provided To: Patient Education Topics Provided Wound/Skin Impairment: Handouts: Other: change dressing as ordered Methods: Demonstration, Explain/Verbal Responses: State content correctly Electronic Signature(s) Signed: 01/27/2017 4:51:41 PM By: Alric Quan Entered By: Alric Quan on 01/27/2017 15:00:10 Laura [name]  ([id number]) -------------------------------------------------------------------------------- Wound Assessment Details Patient Name: Laura [name] Date of Service: 01/27/2017 2:30 PM Medical Record Number: [id number] Patient Account Number: [id number] Date of Birth/Sex: [birth year] (83 y.o. Female) Treating RN: Carolyne Fiscal, Debi Primary Care Darcie Mellone: Halina Maidens Other Clinician: Referring Ravina Milner: Halina Maidens Treating Avis Mcmahill/Extender: Frann Rider in Treatment: 4 Wound Status Wound Number: 1 Primary Diabetic Wound/Ulcer of the Lower Etiology: Extremity Wound Location: Left Lower Leg - Lateral Wound Status: Open Wounding Event: Gradually Appeared Comorbid Type II Diabetes Date Acquired: 12/16/2016 History: Weeks Of Treatment: 4 Clustered Wound: No Photos Wound Measurements Length: (cm) 5 Width: (cm) 3 Depth: (cm) 0.1 Area: (cm) 11.781 Volume: (cm) 1.178 % Reduction in Area: -1099.7% % Reduction in Volume: -1102% Epithelialization: None Tunneling: No Undermining: No Wound Description Classification: Grade 1 Wound Margin: Flat and Intact Exudate Amount: Large Exudate Type: Serous Exudate Color: amber Foul Odor After Cleansing: No Slough/Fibrino Yes Wound Bed Granulation Amount: Medium (34-66%) Exposed Structure Necrotic Amount: Medium (34-66%) Fascia Exposed: No Necrotic Quality: Adherent Slough Fat Layer (Subcutaneous Tissue) Exposed: Yes Tendon Exposed: No Muscle Exposed: No Hiott, Sonita [name]. ([id number]) Joint Exposed: No Bone Exposed: No Periwound Skin Texture Texture Color No Abnormalities Noted: No No Abnormalities Noted: No Callus: No Atrophie Blanche: No Crepitus: No Cyanosis: No Excoriation: Yes Ecchymosis: No Induration: No Erythema: Yes Rash: No Erythema Location: Circumferential Scarring: No Hemosiderin Staining: No Mottled: No  Moisture Pallor: No No Abnormalities Noted: No Rubor: No Dry / Scaly: No Maceration: Yes  Temperature / Pain Temperature: No Abnormality Tenderness on Palpation: Yes Wound Preparation Ulcer Cleansing: Rinsed/Irrigated with Saline Topical Anesthetic Applied: Other: lidocaine 4%, Treatment Notes Wound #1 (Left, Lateral Lower Leg) 1. Cleansed with: Clean wound with Normal Saline 2. Anesthetic Topical Lidocaine 4% cream to wound bed prior to debridement 3. Peri-wound Care: Skin Prep 4. Dressing Applied: Calcium Alginate Other dressing (specify in notes) 5. Secondary Dressing Applied Bordered Foam Dressing Dry Gauze Notes gentamicin Electronic Signature(s) Signed: 01/27/2017 4:51:41 PM By: Alric Quan Entered By: Alric Quan on 01/27/2017 16:49:42 Baiz, Annaya [name]. ([id number]) -------------------------------------------------------------------------------- Vitals Details Patient Name: Viruet, Elianah [name]. Date of Service: 01/27/2017 2:30 PM Medical Record Number: [id number] Patient Account Number: [id number] Date of Birth/Sex: 1925/03/21 (83 y.o. Female) Treating RN: Carolyne Fiscal, Debi Primary Care Denney Shein: Halina Maidens Other Clinician: Referring Zade Falkner: Halina Maidens Treating Letia Guidry/Extender: Melburn Hake, HOYT Weeks in Treatment: 4 Vital Signs Time Taken: 14:46 Temperature ([name]): 98.2 Height (in): 63 Pulse (bpm): 71 Weight (lbs): 164.8 Respiratory Rate (breaths/min): 16 Body Mass Index (BMI): 29.2 Blood Pressure (mmHg): 133/98 Reference Range: 80 - 120 mg / dl Electronic Signature(s) Signed: 01/31/2017 4:17:52 PM By: Gretta Cool, BSN, RN, CWS, Kim RN, BSN Previous Signature: 01/27/2017 4:51:41 PM Version By: Alric Quan Entered By: Gretta Cool, BSN, RN, CWS, Kim on 01/31/2017 11:39:19

## 2017-02-03 ENCOUNTER — Encounter: Payer: Medicare PPO | Admitting: Physician Assistant

## 2017-02-03 DIAGNOSIS — E11622 Type 2 diabetes mellitus with other skin ulcer: Secondary | ICD-10-CM | POA: Diagnosis not present

## 2017-02-05 NOTE — Progress Notes (Signed)
POTEAT, Sharone F. ([id number]) Visit Report for 02/03/2017 Arrival Information Details Patient Name: FUTRELL, [name] Date of Service: 02/03/2017 11:15 AM Medical Record Number: [id number] Patient Account Number: [id number] Date of Birth/Sex: Nov 17, 1924 (83 y.o. Female) Treating RN: Cornell Barman Primary Care Ekaterini Capitano: Halina Maidens Other Clinician: Referring Dam Ashraf: Halina Maidens Treating Grenda Lora/Extender: Melburn Hake, HOYT Weeks in Treatment: 5 Visit Information History Since Last Visit Added or deleted any medications: No Patient Arrived: Kasandra Knudsen Any new allergies or adverse reactions: No Arrival Time: 11:26 Had a fall or experienced change in No Accompanied By: friend activities of daily living that may affect Transfer Assistance: None risk of falls: Patient Identification Verified: Yes Signs or symptoms of abuse/neglect since last No Secondary Verification Process Completed: Yes visito Patient Requires Transmission-Based No Hospitalized since last visit: No Precautions: Has Dressing in Place as Prescribed: Yes Patient Has Alerts: Yes Pain Present Now: No Patient Alerts: DM II Electronic Signature(s) Signed: 02/03/2017 5:15:44 PM By: Gretta Cool, BSN, RN, CWS, Kim RN, BSN Entered By: Gretta Cool, BSN, RN, CWS, Kim on 02/03/2017 11:27:18 Younkin, Royelle F. ([id number]) -------------------------------------------------------------------------------- Clinic Level of Care Assessment Details Patient Name: Saline, Uldine F. Date of Service: 02/03/2017 11:15 AM Medical Record Number: [id number] Patient Account Number: [id number] Date of Birth/Sex: [birth year] (83 y.o. Female) Treating RN: Cornell Barman Primary Care Bolden Hagerman: Halina Maidens Other Clinician: Referring Donalyn Schneeberger: Halina Maidens Treating Jonda Alanis/Extender: Melburn Hake, HOYT Weeks in Treatment: 5 Clinic Level of Care Assessment Items TOOL 4 Quantity Score []  - Use when only an EandM is performed on FOLLOW-UP visit 0 ASSESSMENTS - Nursing Assessment /  Reassessment []  - Reassessment of Co-morbidities (includes updates in patient status) 0 X - Reassessment of Adherence to Treatment Plan 1 5 ASSESSMENTS - Wound and Skin Assessment / Reassessment X - Simple Wound Assessment / Reassessment - one wound 1 5 []  - Complex Wound Assessment / Reassessment - multiple wounds 0 []  - Dermatologic / Skin Assessment (not related to wound area) 0 ASSESSMENTS - Focused Assessment []  - Circumferential Edema Measurements - multi extremities 0 []  - Nutritional Assessment / Counseling / Intervention 0 []  - Lower Extremity Assessment (monofilament, tuning fork, pulses) 0 []  - Peripheral Arterial Disease Assessment (using hand held doppler) 0 ASSESSMENTS - Ostomy and/or Continence Assessment and Care []  - Incontinence Assessment and Management 0 []  - Ostomy Care Assessment and Management (repouching, etc.) 0 PROCESS - Coordination of Care X - Simple Patient / Family Education for ongoing care 1 15 []  - Complex (extensive) Patient / Family Education for ongoing care 0 X - Staff obtains Programmer, systems, Records, Test Results / Process Orders 1 10 []  - Staff telephones HHA, Nursing Homes / Clarify orders / etc 0 []  - Routine Transfer to another Facility (non-emergent condition) 0 Nier, Neviah F. ([id number]) []  - Routine Hospital Admission (non-emergent condition) 0 []  - New Admissions / Biomedical engineer / Ordering NPWT, Apligraf, etc. 0 []  - Emergency Hospital Admission (emergent condition) 0 X - Simple Discharge Coordination 1 10 []  - Complex (extensive) Discharge Coordination 0 PROCESS - Special Needs []  - Pediatric / Minor Patient Management 0 []  - Isolation Patient Management 0 []  - Hearing / Language / Visual special needs 0 []  - Assessment of Community assistance (transportation, D/C planning, etc.) 0 []  - Additional assistance / Altered mentation 0 []  - Support Surface(s) Assessment (bed, cushion, seat, etc.) 0 INTERVENTIONS - Wound Cleansing /  Measurement X - Simple Wound Cleansing - one wound 1 5 []  - Complex Wound Cleansing - multiple wounds 0 X -  Wound Imaging (photographs - any number of wounds) 1 5 []  - Wound Tracing (instead of photographs) 0 X - Simple Wound Measurement - one wound 1 5 []  - Complex Wound Measurement - multiple wounds 0 INTERVENTIONS - Wound Dressings []  - Small Wound Dressing one or multiple wounds 0 X - Medium Wound Dressing one or multiple wounds 1 15 []  - Large Wound Dressing one or multiple wounds 0 []  - Application of Medications - topical 0 []  - Application of Medications - injection 0 INTERVENTIONS - Miscellaneous []  - External ear exam 0 Fifield, Arshia F. ([id number]) []  - Specimen Collection (cultures, biopsies, blood, body fluids, etc.) 0 []  - Specimen(s) / Culture(s) sent or taken to Lab for analysis 0 []  - Patient Transfer (multiple staff / Harrel Lemon Lift / Similar devices) 0 []  - Simple Staple / Suture removal (25 or less) 0 []  - Complex Staple / Suture removal (26 or more) 0 []  - Hypo / Hyperglycemic Management (close monitor of Blood Glucose) 0 []  - Ankle / Brachial Index (ABI) - do not check if billed separately 0 X - Vital Signs 1 5 Has the patient been seen at the hospital within the last three years: Yes Total Score: 80 Level Of Care: New/Established - Level 3 Electronic Signature(s) Signed: 02/03/2017 5:15:44 PM By: Gretta Cool, BSN, RN, CWS, Kim RN, BSN Entered By: Gretta Cool, BSN, RN, CWS, Kim on 02/03/2017 12:11:33 Schewe, Cyanne F. ([id number]) -------------------------------------------------------------------------------- Encounter Discharge Information Details Patient Name: Bath, Luan F. Date of Service: 02/03/2017 11:15 AM Medical Record Number: [id number] Patient Account Number: [id number] Date of Birth/Sex: Oct 01, 1924 (83 y.o. Female) Treating RN: Cornell Barman Primary Care Leeanne Butters: Halina Maidens Other Clinician: Referring Kendre Jacinto: Halina Maidens Treating Laryn Venning/Extender: Melburn Hake,  HOYT Weeks in Treatment: 5 Encounter Discharge Information Items Discharge Pain Level: 0 Discharge Condition: Unstable Ambulatory Status: Cane Discharge Destination: Home Transportation: Other Accompanied By: friend Schedule Follow-up Appointment: Yes Medication Reconciliation completed Yes and provided to Patient/Care Sarely Stracener: Provided on Clinical Summary of Care: 02/03/2017 Form Type Recipient Paper Patient [facility] Electronic Signature(s) Signed: 02/03/2017 5:15:44 PM By: Gretta Cool, BSN, RN, CWS, Kim RN, BSN Entered By: Gretta Cool, BSN, RN, CWS, Kim on 02/03/2017 12:50:32 Coiner, Margo F. ([id number]) -------------------------------------------------------------------------------- Lower Extremity Assessment Details Patient Name: Newbold, Bailyn F. Date of Service: 02/03/2017 11:15 AM Medical Record Number: [id number] Patient Account Number: [id number] Date of Birth/Sex: 12-17-1924 (83 y.o. Female) Treating RN: Cornell Barman Primary Care Issachar Broady: Halina Maidens Other Clinician: Referring Jolane Bankhead: Halina Maidens Treating Daiquan Resnik/Extender: Melburn Hake, HOYT Weeks in Treatment: 5 Edema Assessment Assessed: [Left: No] [Right: No] E[Left: dema] [Right: :] Calf Left: Right: Point of Measurement: 27 cm From Medial Instep 37 cm cm Ankle Left: Right: Point of Measurement: 10 cm From Medial Instep 23 cm cm Vascular Assessment Pulses: Dorsalis Pedis Palpable: [Left:Yes] Posterior Tibial Extremity colors, hair growth, and conditions: Extremity Color: [Left:Red] Hair Growth on Extremity: [Left:No] Temperature of Extremity: [Left:Warm] Capillary Refill: [Left:< 3 seconds] Dependent Rubor: [Left:No] Blanched when Elevated: [Left:No] Lipodermatosclerosis: [Left:No] Toe Nail Assessment Left: Right: Thick: No Discolored: No Deformed: No Improper Length and Hygiene: No Electronic Signature(s) Signed: 02/03/2017 5:15:44 PM By: Gretta Cool, BSN, RN, CWS, Kim RN, BSN Cleere, Kristell F. ([id number]) Entered By:  Gretta Cool, BSN, RN, CWS, Kim on 02/03/2017 11:35:37 Florea, Alla F. ([id number]) -------------------------------------------------------------------------------- Multi Wound Chart Details Patient Name: Spickler, Faatimah F. Date of Service: 02/03/2017 11:15 AM Medical Record Number: [id number] Patient Account Number: [id number] Date of Birth/Sex: Aug 25, 1924 (83 y.o. Female) Treating RN: Cornell Barman Primary Care Skylynne Schlechter: Army Melia,  Mickel Baas Other Clinician: Referring Sira Adsit: Halina Maidens Treating Sheila Ocasio/Extender: Melburn Hake, HOYT Weeks in Treatment: 5 Vital Signs Height(in): 63 Pulse(bpm): 77 Weight(lbs): 164.8 Blood Pressure 145/79 (mmHg): Body Mass Index(BMI): 29 Temperature(F): 98.1 Respiratory Rate 16 (breaths/min): Photos: [N/A:N/A] Wound Location: Left Lower Leg - Lateral N/A N/A Wounding Event: Gradually Appeared N/A N/A Primary Etiology: Diabetic Wound/Ulcer of N/A N/A the Lower Extremity Comorbid History: Type II Diabetes N/A N/A Date Acquired: 12/16/2016 N/A N/A Weeks of Treatment: 5 N/A N/A Wound Status: Open N/A N/A Measurements L x W x D 5x6x0.1 N/A N/A (cm) Area (cm) : 23.562 N/A N/A Volume (cm) : 2.356 N/A N/A % Reduction in Area: -2299.40% N/A N/A % Reduction in Volume: -2304.10% N/A N/A Classification: Grade 1 N/A N/A Exudate Amount: Large N/A N/A Exudate Type: Serous N/A N/A Exudate Color: amber N/A N/A Wound Margin: Flat and Intact N/A N/A Granulation Amount: Medium (34-66%) N/A N/A Necrotic Amount: Medium (34-66%) N/A N/A Exposed Structures: Fat Layer (Subcutaneous N/A N/A Tissue) Exposed: Yes Fascia: No Mazzoni, Jasia F. ([id number]) Tendon: No Muscle: No Joint: No Bone: No Epithelialization: None N/A N/A Periwound Skin Texture: Excoriation: Yes N/A N/A Induration: No Callus: No Crepitus: No Rash: No Scarring: No Periwound Skin Maceration: Yes N/A N/A Moisture: Dry/Scaly: No Periwound Skin Color: Erythema: Yes N/A N/A Atrophie Blanche: No Cyanosis:  No Ecchymosis: No Hemosiderin Staining: No Mottled: No Pallor: No Rubor: No Erythema Location: Circumferential N/A N/A Temperature: No Abnormality N/A N/A Tenderness on Yes N/A N/A Palpation: Wound Preparation: Ulcer Cleansing: N/A N/A Rinsed/Irrigated with Saline Topical Anesthetic Applied: Other: lidocaine 4% Treatment Notes Electronic Signature(s) Signed: 02/03/2017 5:15:44 PM By: Gretta Cool, BSN, RN, CWS, Kim RN, BSN Entered By: Gretta Cool, BSN, RN, CWS, Kim on 02/03/2017 11:38:40 Bartoszek, Nykayla F. ([id number]) -------------------------------------------------------------------------------- Multi-Disciplinary Care Plan Details Patient Name: [name], Avian[place] F. Date of Service: 02/03/2017 11:15 AM Medical Record Number: [id number] Patient Account Number: [id number] Date of Birth/Sex: [birth year] (83 y.o. Female) Treating RN: Cornell Barman Primary Care Kendall Justo: Halina Maidens Other Clinician: Referring Taber Sweetser: Halina Maidens Treating Gracia Saggese/Extender: Melburn Hake, HOYT Weeks in Treatment: 5 Active Inactive ` Abuse / Safety / Falls / Self Care Management Nursing Diagnoses: Potential for falls Goals: Patient will not experience any injury related to falls Date Initiated: 12/30/2016 Target Resolution Date: 04/29/2017 Goal Status: Active Interventions: Assess Activities of Daily Living upon admission and as needed Assess: immobility, friction, shearing, incontinence upon admission and as needed Notes: ` Nutrition Nursing Diagnoses: Imbalanced nutrition Impaired glucose control: actual or potential Potential for alteratiion in Nutrition/Potential for imbalanced nutrition Goals: Patient/caregiver will maintain therapeutic glucose control Date Initiated: 12/30/2016 Target Resolution Date: 04/29/2017 Goal Status: Active Interventions: Assess patient nutrition upon admission and as needed per policy Notes: ` Orientation to the [place], [name] ([id number]) Nursing  Diagnoses: Knowledge deficit related to the wound healing center program Goals: Patient/caregiver will verbalize understanding of the [place] Date Initiated: 12/30/2016 Target Resolution Date: 01/21/2017 Goal Status: Active Interventions: Provide education on orientation to the wound center Notes: ` Pain, Acute or Chronic Nursing Diagnoses: Pain, acute or chronic: actual or potential Potential alteration in comfort, pain Goals: Patient/caregiver will verbalize adequate pain control between visits Date Initiated: 12/30/2016 Target Resolution Date: 04/29/2017 Goal Status: Active Interventions: Complete pain assessment as per visit requirements Notes: ` Wound/Skin Impairment Nursing Diagnoses: Impaired tissue integrity Knowledge deficit related to smoking impact on wound healing Knowledge deficit related to ulceration/compromised skin integrity Goals: Ulcer/skin breakdown will have a volume reduction of 80%  by week 12 Date Initiated: 12/30/2016 Target Resolution Date: 04/22/2017 Goal Status: Active Interventions: Assess patient/caregiver ability to perform ulcer/skin care regimen upon admission and as needed Notes: ADALAYA, IRION ([id number]) Electronic Signature(s) Signed: 02/03/2017 5:15:44 PM By: Gretta Cool, BSN, RN, CWS, Kim RN, BSN Entered By: Gretta Cool, BSN, RN, CWS, Kim on 02/03/2017 11:35:43 Knust, Chizaram F. ([id number]) -------------------------------------------------------------------------------- Pain Assessment Details Patient Name: Corado, Hollyn F. Date of Service: 02/03/2017 11:15 AM Medical Record Number: [id number] Patient Account Number: [id number] Date of Birth/Sex: [birth year] (83 y.o. Female) Treating RN: Cornell Barman Primary Care Breea Loncar: Halina Maidens Other Clinician: Referring Nekhi Liwanag: Halina Maidens Treating Areli Frary/Extender: Melburn Hake, HOYT Weeks in Treatment: 5 Active Problems Location of Pain Severity and Description of Pain Patient Has Paino  No Site Locations With Dressing Change: No Pain Management and Medication Current Pain Management: Goals for Pain Management Topical or injectable lidocaine is offered to patient for acute pain when surgical debridement is performed. If needed, Patient is instructed to use over the counter pain medication for the following 24-48 hours after debridement. Wound care MDs do not prescribed pain medications. Patient has chronic pain or uncontrolled pain. Patient has been instructed to make an appointment with their Primary Care Physician for pain management. Electronic Signature(s) Signed: 02/03/2017 5:15:44 PM By: Gretta Cool, BSN, RN, CWS, Kim RN, BSN Entered By: Gretta Cool, BSN, RN, CWS, Kim on 02/03/2017 11:27:40 Adamson, Abbegale F. ([id number]) -------------------------------------------------------------------------------- Patient/Caregiver Education Details Patient Name: [name], Jelene F. Date of Service: 02/03/2017 11:15 AM Medical Record Number: [id number] Patient Account Number: [id number] Date of Birth/Gender: [birth year] (83 y.o. Female) Treating RN: Cornell Barman Primary Care Physician: Halina Maidens Other Clinician: Referring Physician: Halina Maidens Treating Physician/Extender: Melburn Hake, HOYT Weeks in Treatment: 5 Education Assessment Education Provided To: Patient Education Topics Provided Wound/Skin Impairment: Handouts: Caring for Your Ulcer, Other: wound care as prescribed Methods: Demonstration Responses: State content correctly Electronic Signature(s) Signed: 02/03/2017 5:15:44 PM By: Gretta Cool, BSN, RN, CWS, Kim RN, BSN Entered By: Gretta Cool, BSN, RN, CWS, Kim on 02/03/2017 12:50:57 Colocho, Gresia F. ([id number]) -------------------------------------------------------------------------------- Wound Assessment Details Patient Name: Bohac, Amor F. Date of Service: 02/03/2017 11:15 AM Medical Record Number: [id number] Patient Account Number: [id number] Date of Birth/Sex: 09/23/1924 (83 y.o.  Female) Treating RN: Cornell Barman Primary Care Sherece Gambrill: Halina Maidens Other Clinician: Referring Shalen Petrak: Halina Maidens Treating Kadence Mikkelson/Extender: Melburn Hake, HOYT Weeks in Treatment: 5 Wound Status Wound Number: 1 Primary Diabetic Wound/Ulcer of the Lower Etiology: Extremity Wound Location: Left Lower Leg - Lateral Wound Status: Open Wounding Event: Gradually Appeared Comorbid Type II Diabetes Date Acquired: 12/16/2016 History: Weeks Of Treatment: 5 Clustered Wound: No Photos Wound Measurements Length: (cm) 5 Width: (cm) 6 Depth: (cm) 0.1 Area: (cm) 23.562 Volume: (cm) 2.356 % Reduction in Area: -2299.4% % Reduction in Volume: -2304.1% Epithelialization: None Tunneling: No Undermining: No Wound Description Classification: Grade 1 Wound Margin: Flat and Intact Exudate Amount: Large Exudate Type: Serous Exudate Color: amber Foul Odor After Cleansing: No Slough/Fibrino Yes Wound Bed Granulation Amount: Medium (34-66%) Exposed Structure Necrotic Amount: Medium (34-66%) Fascia Exposed: No Necrotic Quality: Adherent Slough Fat Layer (Subcutaneous Tissue) Exposed: Yes Tendon Exposed: No Muscle Exposed: No Joint Exposed: No Bone Exposed: No Kopischke, Tekia F. ([id number]) Periwound Skin Texture Texture Color No Abnormalities Noted: No No Abnormalities Noted: No Callus: No Atrophie Blanche: No Crepitus: No Cyanosis: No Excoriation: Yes Ecchymosis: No Induration: No Erythema: Yes Rash: No Erythema Location: Circumferential Scarring: No Hemosiderin Staining: No Mottled: No Moisture Pallor: No No Abnormalities Noted: No Rubor: No Dry /  Scaly: No Maceration: Yes Temperature / Pain Temperature: No Abnormality Tenderness on Palpation: Yes Wound Preparation Ulcer Cleansing: Rinsed/Irrigated with Saline Topical Anesthetic Applied: Other: lidocaine 4%, Treatment Notes Wound #1 (Left, Lateral Lower Leg) 1. Cleansed with: Clean wound with Normal  Saline 2. Anesthetic Topical Lidocaine 4% cream to wound bed prior to debridement 4. Dressing Applied: Other dressing (specify in notes) Notes Cutimed Sorbact Hydroactive B Electronic Signature(s) Signed: 02/03/2017 5:15:44 PM By: Gretta Cool, BSN, RN, CWS, Kim RN, BSN Entered By: Gretta Cool, BSN, RN, CWS, Kim on 02/03/2017 11:31:46 Montemayor, Laketra F. ([id number]) -------------------------------------------------------------------------------- Vitals Details Patient Name: [name], Aliza F. Date of Service: 02/03/2017 11:15 AM Medical Record Number: [id number] Patient Account Number: [id number] Date of Birth/Sex: 1924-08-01 (83 y.o. Female) Treating RN: Cornell Barman Primary Care Autumn Gunn: Halina Maidens Other Clinician: Referring Weiland Tomich: Halina Maidens Treating Naoma Boxell/Extender: Melburn Hake, HOYT Weeks in Treatment: 5 Vital Signs Time Taken: 11:27 Temperature (F): 98.1 Height (in): 63 Pulse (bpm): 77 Weight (lbs): 164.8 Respiratory Rate (breaths/min): 16 Body Mass Index (BMI): 29.2 Blood Pressure (mmHg): 145/79 Reference Range: 80 - 120 mg / dl Electronic Signature(s) Signed: 02/03/2017 5:15:44 PM By: Gretta Cool, BSN, RN, CWS, Kim RN, BSN Entered By: Gretta Cool, BSN, RN, CWS, Kim on 02/03/2017 11:28:02

## 2017-02-06 ENCOUNTER — Other Ambulatory Visit: Payer: Self-pay | Admitting: Internal Medicine

## 2017-02-06 ENCOUNTER — Telehealth: Payer: Self-pay

## 2017-02-06 DIAGNOSIS — I10 Essential (primary) hypertension: Secondary | ICD-10-CM

## 2017-02-06 MED ORDER — TRIAMTERENE-HCTZ 75-50 MG PO TABS: 1.0000 | ORAL_TABLET | Freq: Every day | ORAL | 0 refills | Status: DC

## 2017-02-06 NOTE — Telephone Encounter (Signed)
Patient called about increase on HCTZ. Stating she wants to speak to you because she is not understanding why you won't increase it. States this is an emergency and she may loose her leg. Please Advise.

## 2017-02-06 NOTE — Telephone Encounter (Signed)
Let her know that I am willing to give her a one month trial of increased dose which I sent to the pharmacy.  She will need to come see me at the end of that month for a recheck of weight, edema and labs for kidney function.  If she is not significantly improved, then I will reduce the dose back to the lower one.

## 2017-02-06 NOTE — Progress Notes (Addendum)
Laura [name], [name] ([id number]) Visit Report for 02/03/2017 Chief Complaint Document Details Patient Name: Laura [name], [name] Date of Service: 02/03/2017 11:15 AM Medical Record Number: [id number] Patient Account Number: [id number] Date of Birth/Sex: 12/22/1924 (83 y.o. Female) Treating RN: Laura [name] Primary Care Provider: Halina [name] Other Clinician: Referring Provider: Halina [name] Treating Provider/Extender: Laura [name] Weeks in Treatment: 5 Information Obtained from: Patient Chief Complaint Left lower leg cellulitis Electronic Signature(s) Signed: 02/03/2017 3:50:36 PM By: Laura Keeler PA-C Entered By: Laura [name] on 02/03/2017 11:40:37 [name], [name] ([id number]) -------------------------------------------------------------------------------- HPI Details Patient Name: Laura [name], [name] Date of Service: 02/03/2017 11:15 AM Medical Record Number: [id number] Patient Account Number: [id number] Date of Birth/Sex: Aug 05, 1924 (83 y.o. Female) Treating RN: Laura [name] Primary Care Provider: Halina [name] Other Clinician: Referring Provider: Halina [name] Treating Provider/Extender: Laura [name] Weeks in Treatment: 5 History of Present Illness HPI Description: 12/30/16 Patient presents today for initial evaluation concerning an area over her left lateral ankle which she tells me has been intermittent in nature since 2000. However the current opening has been present for about two weeks. She has been tolerating the dressing changes at home with antibiotic ointment but that is really the only treatment that has been initiated at this point. That is other than the oral antibiotics which was Keflex that patient was placed on by her primary care provider prior to being referred to [place]. She does have some discomfort but rates this to be around a 2-3 out of 10 fortunately the redness does not seem to be spreading to any other location as far as her lower extremity is concerned. She does tell  me that in the past antibiotics have been beneficial for her unfortunately the current antibiotics have not been of benefit and no wound culture was performed as of yet. She has previously seen Dr. 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 She does have diabetes and are most recent blood sugars have run between 104 and 106 according to patient's although her last hemoglobin A1c she is unaware of. Patient's current wound does not appear to be significantly open but is rather more of a weeping region of cellulitis. 01/27/17 on evaluation today patient left lower for many wound continues to show signs of erythema surrounding and in fact she is noted to have erythema from her toes up to just below the knee in regard to left lower extremity. She is not having any fevers, chills, nausea, vomiting, or diarrhea at this point. With that being said she is concerned about the silver alginate dressing. She tells me that in the past she used silver and some of the dressings for previous one and did not do well with it. Nonetheless she has discomfort along the wound itself but no other pain noted throughout the left lower extremity. 02/03/17 on evaluation today patient appears to be doing fairly well overall other than the fact that her wound is worsening on the lateral aspect of her left lower extremity. It appears more macerated and even slough covered at this point. With that being said she has been attempting to perform the dressing changes on her own although I'm not sure that she is understanding exactly how this should be done. Nonetheless I do believe the gentamicin may be causing too much maceration she really has not  wanted to utilize the silver alginate dressings past due to a previous issue with this. Overall her erythema of  the left lower extremity is improved she still has significant swelling. No fevers, chills, nausea, or vomiting noted at this time. Electronic Signature(s) Signed: 02/03/2017 3:50:36 PM By: Laura Keeler PA-C Entered By: Laura [name] on 02/03/2017 14:02:34 Laura [name], [name] ([id number]) -------------------------------------------------------------------------------- Physical Exam Details Patient Name: [name], [name] Date of Service: 02/03/2017 11:15 AM Medical Record Number: [id number] Patient Account Number: [id number] Date of Birth/Sex: 1924/08/17 (83 y.o. Female) Treating RN: Laura [name] Primary Care Provider: Halina [name] Other Clinician: Referring Provider: Halina [name] Treating Provider/Extender: Laura [name] Weeks in Treatment: 5 Constitutional Well-nourished and well-hydrated in no acute distress. Respiratory normal breathing without difficulty. clear to auscultation bilaterally. Cardiovascular regular rate and rhythm with normal S1, S2. Psychiatric this patient is able to make decisions and demonstrates good insight into disease process. Alert and Oriented x 3. pleasant and cooperative. Notes Patient's wound shows erythema surrounding the wound bed along with mostly clear drainage noted weeping from the surface. There is no purulent discharge at this point. The previous wound culture was negative for any growth. Electronic Signature(s) Signed: 02/03/2017 3:50:36 PM By: Laura Keeler PA-C Entered By: Laura [name] on 02/03/2017 14:03:19 Laura [name], [name] ([id number]) -------------------------------------------------------------------------------- Physician Orders Details Patient Name: [name], [name] Date of Service: 02/03/2017 11:15 AM Medical Record Number: [id number] Patient Account Number: [id number] Date of Birth/Sex: September 05, 1924 (83 y.o. Female) Treating RN: Laura [name] Primary Care Provider: Halina [name] Other Clinician: Referring Provider: Halina [name] Treating Provider/Extender: Laura [name] Weeks in Treatment: 5 Verbal / Phone Orders: No Diagnosis Coding ICD-10 Coding Code Description E11.622 Type 2 diabetes mellitus with other skin ulcer L03.116 Cellulitis of left lower limb I10 Essential (primary) hypertension I89.0 Lymphedema, not elsewhere classified Wound Cleansing Wound #1 Left,Lateral Lower Leg o Clean wound with wound cleanser. Anesthetic Wound #1 Left,Lateral Lower Leg o Topical Lidocaine 4% cream applied to wound bed prior to debridement Primary Wound Dressing o Cutimed Sorbact Hydroactive B Dressing Change Frequency o Three times weekly Follow-up Appointments Wound #1 Left,Lateral Lower Leg o Return Appointment in 1 week. Edema Control Wound #1 Left,Lateral Lower Leg o Elevate legs to the level of the heart and pump ankles as often as possible Electronic Signature(s) Signed: 02/03/2017 3:50:36 PM By: Laura Keeler PA-C Signed: 02/03/2017 5:15:44 PM By: Gretta Cool, BSN, RN, CWS, Kim RN, BSN Entered By: Gretta Cool, BSN, RN, CWS, Kim on 02/03/2017 12:11:02 Teater, Lauris F. ([id number]) -------------------------------------------------------------------------------- Problem List Details Patient Name: Montesdeoca, Jehan F. Date of Service: 02/03/2017 11:15 AM Medical Record Number: [id number] Patient Account Number: [id number] Date of Birth/Sex: April 13, 1925 (83 y.o. Female) Treating RN: Laura [name] Primary Care Provider: Halina [name] Other Clinician: Referring Provider: Halina [name] Treating Provider/Extender: Laura [name] Weeks in Treatment: 5 Active Problems ICD-10 Encounter Code Description Active Date Diagnosis E11.622 Type 2 diabetes mellitus with other skin ulcer 01/02/2017 Yes L03.116 Cellulitis of left lower limb 01/02/2017 Yes I10 Essential (primary) hypertension 01/02/2017 Yes I89.0 Lymphedema, not elsewhere classified 01/16/2017 Yes Inactive Problems Resolved Problems Electronic  Signature(s) Signed: 02/03/2017 3:50:36 PM By: Laura Keeler PA-C Entered By: Laura [name] on 02/03/2017 11:40:18 Laura [name], [name] ([id number]) -------------------------------------------------------------------------------- Progress Note Details Patient Name: Laura [name], [name] Date of Service: 02/03/2017 11:15 AM Medical Record Number: [id number] Patient Account Number: [id number] Date of Birth/Sex: 12/07/1924 (83 y.o. Female) Treating RN: Laura [name] Primary Care Provider: Halina [name] Other Clinician: Referring  Provider: Halina [name] Treating Provider/Extender: Laura [name] Weeks in Treatment: 5 Subjective Chief Complaint Information obtained from Patient Left lower leg cellulitis History of Present Illness (HPI) 12/30/16 Patient presents today for initial evaluation concerning an area over her left lateral ankle which she tells me has been intermittent in nature since 2000. However the current opening has been present for about two weeks. She has been tolerating the dressing changes at home with antibiotic ointment but that is really the only treatment that has been initiated at this point. That is other than the oral antibiotics which was Keflex that patient was placed on by her primary care provider prior to being referred to [place]. She does have some discomfort but rates this to be around a 2-3 out of 10 fortunately the redness does not seem to be spreading to any other location as far as her lower extremity is concerned. She does tell me that in the past antibiotics have been beneficial for her unfortunately the current antibiotics have not been of benefit and no wound culture was performed as of yet. She has previously seen Dr. 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 She does have diabetes and are most  recent blood sugars have run between 104 and 106 according to patient's although her last hemoglobin A1c she is unaware of. Patient's current wound does not appear to be significantly open but is rather more of a weeping region of cellulitis. 01/27/17 on evaluation today patient left lower for many wound continues to show signs of erythema surrounding and in fact she is noted to have erythema from her toes up to just below the knee in regard to left lower extremity. She is not having any fevers, chills, nausea, vomiting, or diarrhea at this point. With that being said she is concerned about the silver alginate dressing. She tells me that in the past she used silver and some of the dressings for previous one and did not do well with it. Nonetheless she has discomfort along the wound itself but no other pain noted throughout the left lower extremity. 02/03/17 on evaluation today patient appears to be doing fairly well overall other than the fact that her wound is worsening on the lateral aspect of her left lower extremity. It appears more macerated and even slough covered at this point. With that being said she has been attempting to perform the dressing changes on her own although I'm not sure that she is understanding exactly how this should be done. Nonetheless I do believe the gentamicin may be causing too much maceration she really has not wanted to utilize the silver alginate dressings past due to a previous issue with this. Overall her erythema of the left lower extremity is improved she still has significant swelling. No fevers, chills, nausea, or vomiting noted at this time. Laura [name], [name] ([id number]) Objective Constitutional Well-nourished and well-hydrated in no acute distress. Vitals Time Taken: 11:27 AM, Height: 63 in, Weight: 164.8 lbs, BMI: 29.2, Temperature: 98.1 F, Pulse: 77 bpm, Respiratory Rate: 16 breaths/min, Blood Pressure: 145/79 mmHg. Respiratory normal breathing without  difficulty. clear to auscultation bilaterally. Cardiovascular regular rate and rhythm with normal S1, S2. Psychiatric this patient is able to make decisions and demonstrates good insight into disease process. Alert and Oriented x 3. pleasant and cooperative. General Notes: Patient's wound  shows erythema surrounding the wound bed along with mostly clear drainage noted weeping from the surface. There is no purulent discharge at this point. The previous wound culture was negative for any growth. Integumentary (Hair, Skin) Wound #1 status is Open. Original cause of wound was Gradually Appeared. The wound is located on the Left,Lateral Lower Leg. The wound measures 5cm length x 6cm width x 0.1cm depth; 23.562cm^2 area and 2.356cm^3 volume. There is Fat Layer (Subcutaneous Tissue) Exposed exposed. There is no tunneling or undermining noted. There is a large amount of serous drainage noted. The wound margin is flat and intact. There is medium (34-66%) granulation within the wound bed. There is a medium (34-66%) amount of necrotic tissue within the wound bed including Adherent Slough. The periwound skin appearance exhibited: Excoriation, Maceration, Erythema. The periwound skin appearance did not exhibit: Callus, Crepitus, Induration, Rash, Scarring, Dry/Scaly, Atrophie Blanche, Cyanosis, Ecchymosis, Hemosiderin Staining, Mottled, Pallor, Rubor. The surrounding wound skin color is noted with erythema which is circumferential. Periwound temperature was noted as No Abnormality. The periwound has tenderness on palpation. Assessment Active Problems ICD-10 E11.622 - Type 2 diabetes mellitus with other skin ulcer L03.116 - Cellulitis of left lower limb I10 - Essential (primary) hypertension Laura [name], [name] ([id number]) I89.0 - Lymphedema, not elsewhere classified Plan Wound Cleansing: Wound #1 Left,Lateral Lower Leg: Clean wound with wound cleanser. Anesthetic: Wound #1 Left,Lateral Lower  Leg: Topical Lidocaine 4% cream applied to wound bed prior to debridement Primary Wound Dressing: Cutimed Sorbact Hydroactive B Dressing Change Frequency: Three times weekly Follow-up Appointments: Wound #1 Left,Lateral Lower Leg: Return Appointment in 1 week. Edema Control: Wound #1 Left,Lateral Lower Leg: Elevate legs to the level of the heart and pump ankles as often as possible At this time we are gonna initiate the above wound care orders for the next week. Her for reevaluation following for see how things are doing. Hopefully this will make for a more appropriate and easier dressing to be performed and we are also gonna work on home health to help in that regard. The dressing should be changed three times a week. Except quality will void any compression wraps at this time until she has her vascular evaluation of potential intervention with a vascular surgeon depending on the findings of the arterial studies with ABI. Otherwise we see her for reevaluation in one week. Addendum: Dr. Meliton Rattan patient's primary care providerd contacted [place] following patient contacting her about increasing her fluid pill to help with her lower extremity edema. At this point her primary care physician does not want to do this because of her age. Obviously this could be a safety factor. Therefore that is not gonna be an option for helping to control her lower extremity edema. Laura [name], [name] ([id number]) Electronic Signature(s) Signed: 02/03/2017 3:50:36 PM By: Laura Keeler PA-C Entered By: Laura [name] on 02/03/2017 14:09:10 Laura [name], [name] ([id number]) -------------------------------------------------------------------------------- SuperBill Details Patient Name: Laura [name], [name] Date of Service: 02/03/2017 Medical Record Number: [id number] Patient Account Number: [id number] Date of Birth/Sex: 1925/05/27 (83 y.o. Female) Treating RN: Laura [name] Primary Care Provider: Halina [name] Other  Clinician: Referring Provider: Halina [name] Treating Provider/Extender: Laura [name] Weeks in Treatment: 5 Diagnosis Coding ICD-10 Codes Code Description E11.622 Type 2 diabetes mellitus with other skin ulcer L03.116 Cellulitis of left lower limb I10 Essential (primary) hypertension I89.0 Lymphedema, not elsewhere classified Facility Procedures CPT4 Code: [id number] Description: 99213 - WOUND CARE VISIT-LEV 3 EST PT Modifier: Quantity: 1 Physician Procedures CPT4 Code: [id number]  Description: 99213 - WC PHYS LEVEL 3 - EST PT ICD-10 Description Diagnosis E11.622 Type 2 diabetes mellitus with other skin ulcer L03.116 Cellulitis of left lower limb I10 Essential (primary) hypertension I89.0 Lymphedema, not elsewhere classified Modifier: Quantity: 1 Electronic Signature(s) Signed: 02/03/2017 3:50:36 PM By: Laura Keeler PA-C Entered By: Laura [name] on 02/03/2017 14:05:12

## 2017-02-07 NOTE — Telephone Encounter (Signed)
Patient already has appointment 9/11- informed we will see her at the visit to recheck these things as well. She verbalized understanding that if it does not improve significantly then dose will be reduced back to previous.

## 2017-02-10 ENCOUNTER — Other Ambulatory Visit (INDEPENDENT_AMBULATORY_CARE_PROVIDER_SITE_OTHER): Payer: Self-pay | Admitting: Vascular Surgery

## 2017-02-10 ENCOUNTER — Encounter: Payer: Medicare PPO | Admitting: Surgery

## 2017-02-10 DIAGNOSIS — E11622 Type 2 diabetes mellitus with other skin ulcer: Secondary | ICD-10-CM | POA: Diagnosis not present

## 2017-02-10 DIAGNOSIS — I739 Peripheral vascular disease, unspecified: Secondary | ICD-10-CM

## 2017-02-10 DIAGNOSIS — I7090 Unspecified atherosclerosis: Secondary | ICD-10-CM

## 2017-02-12 NOTE — Progress Notes (Signed)
[name], [name] ([id number]) Visit Report for 02/10/2017 Chief Complaint Document Details Patient Name: Laura [name], [name] Date of Service: 02/10/2017 1:30 PM Medical Record Number: [id number] Patient Account Number: [id number] Date of Birth/Sex: 04-30-1925 (83 y.o. Female) Treating RN: Ahmed Prima Primary Care Provider: Halina Maidens Other Clinician: Referring Provider: Halina Maidens Treating Provider/Extender: Frann Rider in Treatment: 6 Information Obtained from: Patient Chief Complaint Left lower leg cellulitis Electronic Signature(s) Signed: 02/10/2017 3:48:30 PM By: Christin Fudge MD, FACS Entered By: Christin Fudge on 02/10/2017 14:30:14 Decuir, Lineth F. ([id number]) -------------------------------------------------------------------------------- HPI Details [name], [name] Date of Service: 02/10/2017 1:30 PM Medical Record Number: [id number] Patient Account Number: [id number] Date of Birth/Sex: 1924-12-22 (83 y.o. Female) Treating RN: Carolyne Fiscal, Debi Primary Care Provider: Halina Maidens Other Clinician: Referring Provider: Halina Maidens Treating Provider/Extender: Frann Rider in Treatment: 6 History of Present Illness HPI Description: 12/30/16 Patient presents today for initial evaluation concerning an area over her left lateral ankle which she tells me has been intermittent in nature since 2000. However the current opening has been present for about two weeks. She has been tolerating the dressing changes at home with antibiotic ointment but that is really the only treatment that has been initiated at this point. That is other than the oral antibiotics which was Keflex that patient was placed on by her primary care provider prior to being referred to [place]. She does have some discomfort but rates this to be around a 2-3 out of 10 fortunately the redness does not seem to be spreading to any other location as far as her lower extremity is concerned. She does  tell me that in the past antibiotics have been beneficial for her unfortunately the current antibiotics have not been of benefit and no wound culture was performed as of yet. She has previously seen Dr. 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 She does have diabetes and are most recent blood sugars have run between 104 and 106 according to patient's although her last hemoglobin A1c she is unaware of. Patient's current wound does not appear to be significantly open but is rather more of a weeping region of cellulitis. 01/27/17 on evaluation today patient left lower for many wound continues to show signs of erythema surrounding and in fact she is noted to have erythema from her toes up to just below the knee in regard to left lower extremity. She is not having any fevers, chills, nausea, vomiting, or diarrhea at this point. With that being said she is concerned about the silver alginate dressing. She tells me that in the past she used silver and some of the dressings for previous one and did not do well with it. Nonetheless she has discomfort along the wound itself but no other pain noted throughout the left lower extremity. 02/03/17 on evaluation today patient appears to be doing fairly well overall other than the fact that her wound is worsening on the lateral aspect of her left lower extremity. It appears more macerated and even slough covered at this point. With that being said she has been attempting to perform the dressing changes on her own although I'm not sure that she is understanding exactly how this should be done. Nonetheless I do believe the gentamicin may be causing too much maceration she really has not wanted to utilize  the silver alginate dressings past due to a previous issue with this. Overall her erythema  of the left lower extremity is improved she still has significant swelling. No fevers, chills, nausea, or vomiting noted at this time. 02/10/2017 -- Old notes received -- was seen in 2009 by [facility] by Dr. Romie Levee -- his impression was chronic patches of dermatitis on the left lower extremity and current mild swelling. He recommended venous insufficiency venous reflux studies. She had no evidence of arterial insufficiency. Follow-up chronic venous insufficiency study done on 12/16/2007, showed very mild amount of reflux on the great saphenous vein at the knee but the function of the ve in is normal and there was no evidence by the venous reflux study.. They referred her to dermatology for a second opinion and would see her back in about 4-6 weeks. She was seen back in follow-up in January 01 2008 and at that time she was asked to use a steroid ointment locally and the wounds are almost completely healed. Bonsell, Analyse F. ([id number]) The patient has told me today that she does not tolerate silver dressing -- and she has not had home health come and change her dressings over the last week. Electronic Signature(s) Signed: 02/10/2017 3:48:30 PM By: Christin Fudge MD, FACS Entered By: Christin Fudge on 02/10/2017 14:31:32 Lofstrom, Lottie F. ([id number]) -------------------------------------------------------------------------------- Physical Exam Details [name], [name] Date of Service: 02/10/2017 1:30 PM Medical Record Number: [id number] Patient Account Number: [id number] Date of Birth/Sex: [birth year] (83 y.o. Female) Treating RN: Ahmed Prima Primary Care Provider: Halina Maidens Other Clinician: Referring Provider: Halina Maidens Treating Provider/Extender: Frann Rider in Treatment: 6 Constitutional . Pulse regular. Respirations normal and unlabored. Afebrile. . Eyes Nonicteric. Reactive to light. Ears, Nose, Mouth, and Throat Lips, teeth, and gums WNL.Marland Kitchen Moist mucosa  without lesions. Neck supple and nontender. No palpable supraclavicular or cervical adenopathy. Normal sized without goiter. Respiratory WNL. No retractions.. Cardiovascular Pedal Pulses WNL. No clubbing, cyanosis or edema. Lymphatic No adneopathy. No adenopathy. No adenopathy. Musculoskeletal Adexa without tenderness or enlargement.. Digits and nails w/o clubbing, cyanosis, infection, petechiae, ischemia, or inflammatory conditions.. Integumentary (Hair, Skin) No suspicious lesions. No crepitus or fluctuance. No peri-wound warmth or erythema. No masses.Marland Kitchen Psychiatric Judgement and insight Intact.. No evidence of depression, anxiety, or agitation.. Notes the lymphedema is excessive and there is no purulent drainage from the wound nor is there any evidence of gross ascending cellulitis. Electronic Signature(s) Signed: 02/10/2017 3:48:30 PM By: Christin Fudge MD, FACS Entered By: Christin Fudge on 02/10/2017 14:32:06 Monnin, Marveline F. ([id number]) -------------------------------------------------------------------------------- Physician Orders Details Patient Name: Fecteau, Kimyetta F. Date of Service: 02/10/2017 1:30 PM Medical Record Number: [id number] Patient Account Number: [id number] Date of Birth/Sex: [birth year] (83 y.o. Female) Treating RN: Ahmed Prima Primary Care Provider: Halina Maidens Other Clinician: Referring Provider: Halina Maidens Treating Provider/Extender: Frann Rider in Treatment: 6 Verbal / Phone Orders: No Diagnosis Coding Wound Cleansing Wound #1 Left,Lateral Lower Leg o Clean wound with wound cleanser. o Cleanse wound with mild soap and water Anesthetic Wound #1 Left,Lateral Lower Leg o Topical Lidocaine 4% cream applied to wound bed prior to debridement Skin Barriers/Peri-Wound Care Wound #1 Left,Lateral Lower Leg o Barrier cream Primary Wound Dressing Wound #1 Left,Lateral Lower Leg o Aquacel - calcium alginate (NO SILVER) Secondary  Dressing Wound #1 Left,Lateral Lower Leg o ABD pad o Dry Gauze o Drawtex Dressing Change Frequency o Change Dressing Monday, Wednesday, Friday - HHRN to change dressing Monday and Wednesday and  pt will come to the wound care center on Friday Follow-up Appointments Wound #1 Left,Lateral Lower Leg o Return Appointment in 1 week. Edema Control Wound #1 Left,Lateral Lower Leg o Kerlix and Coban - Left Lower Extremity - unna to anchor o Elevate legs to the level of the heart and pump ankles as often as possible Sliger, Ramanda F. ([id number]) Home Health Wound #1 [place] for [place] Nurse may visit PRN to address patientos wound care needs. o FACE TO FACE ENCOUNTER: MEDICARE and MEDICAID PATIENTS: I certify that this patient is under my care and that I had a face-to-face encounter that meets the physician face-to-face encounter requirements with this patient on this date. The encounter with the patient was in whole or in part for the following MEDICAL CONDITION: (primary reason for [place]) MEDICAL NECESSITY: I certify, that based on my findings, NURSING services are a medically necessary home health service. HOME BOUND STATUS: I certify that my clinical findings support that this patient is homebound (i.e., Due to illness or injury, pt requires aid of supportive devices such as crutches, cane, wheelchairs, walkers, the use of special transportation or the assistance of another person to leave their place of residence. There is a normal inability to leave the home and doing so requires considerable and taxing effort. Other absences are for medical reasons / religious services and are infrequent or of short duration when for other reasons). o If current dressing causes regression in wound condition, may D/C ordered dressing product/s and apply Normal Saline Moist Dressing daily until next [place] / Other MD appointment. [place] of regression in wound condition at [phone]. o Please direct any NON-WOUND related issues/requests for orders to patient's Primary Care Physician Electronic Signature(s) Signed: 02/10/2017 3:48:30 PM By: Christin Fudge MD, FACS Signed: 02/10/2017 4:07:04 PM By: Alric Quan Entered By: Alric Quan on 02/10/2017 14:32:18 Shoaff, Sherine F. ([id number]) -------------------------------------------------------------------------------- Problem List Details Patient Name: Banta, Jakeisha F. Date of Service: 02/10/2017 1:30 PM Medical Record Number: [id number] Patient Account Number: [id number] Date of Birth/Sex: Feb 08, 1925 (83 y.o. Female) Treating RN: Carolyne Fiscal, Debi Primary Care Provider: Halina Maidens Other Clinician: Referring Provider: Halina Maidens Treating Provider/Extender: Frann Rider in Treatment: 6 Active Problems ICD-10 Encounter Code Description Active Date Diagnosis E11.622 Type 2 diabetes mellitus with other skin ulcer 01/02/2017 Yes L03.116 Cellulitis of left lower limb 01/02/2017 Yes I10 Essential (primary) hypertension 01/02/2017 Yes I89.0 Lymphedema, not elsewhere classified 01/16/2017 Yes Inactive Problems Resolved Problems Electronic Signature(s) Signed: 02/10/2017 3:48:30 PM By: Christin Fudge MD, FACS Entered By: Christin Fudge on 02/10/2017 14:29:58 Dietzman, Gwynneth F. ([id number]) -------------------------------------------------------------------------------- Progress Note Details Patient Name: Baillargeon, Brooksie F. Date of Service: 02/10/2017 1:30 PM Medical Record Number: [id number] Patient Account Number: [id number] Date of Birth/Sex: 12/18/1924 (83 y.o. Female) Treating RN: Carolyne Fiscal, Debi Primary Care Provider: Halina Maidens Other Clinician: Referring Provider: Halina Maidens Treating Provider/Extender: Frann Rider in Treatment: 6 Subjective Chief Complaint Information obtained from  Patient Left lower leg cellulitis History of Present Illness (HPI) 12/30/16 Patient presents today for initial evaluation concerning an area over her left lateral ankle which she tells me has been intermittent in nature since 2000. However the current opening has been present for about two weeks. She has been tolerating the dressing changes at home with antibiotic ointment but that is really the only treatment that has been initiated at this point. That is other than the oral antibiotics which was  Keflex that patient was placed on by her primary care provider prior to being referred to [place]. She does have some discomfort but rates this to be around a 2-3 out of 10 fortunately the redness does not seem to be spreading to any other location as far as her lower extremity is concerned. She does tell me that in the past antibiotics have been beneficial for her unfortunately the current antibiotics have not been of benefit and no wound culture was performed as of yet. She has previously seen Dr. 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 She does have diabetes and are most recent blood sugars have run between 104 and 106 according to patient's although her last hemoglobin A1c she is unaware of. Patient's current wound does not appear to be significantly open but is rather more of a weeping region of cellulitis. 01/27/17 on evaluation today patient left lower for many wound continues to show signs of erythema surrounding and in fact she is noted to have erythema from her toes up to just below the knee in regard to left lower extremity. She is not having any fevers, chills, nausea, vomiting, or diarrhea at this point. With that being said she is concerned about the silver alginate dressing. She tells me that in the past she used  silver and some of the dressings for previous one and did not do well with it. Nonetheless she has discomfort along the wound itself but no other pain noted throughout the left lower extremity. 02/03/17 on evaluation today patient appears to be doing fairly well overall other than the fact that her wound is worsening on the lateral aspect of her left lower extremity. It appears more macerated and even slough covered at this point. With that being said she has been attempting to perform the dressing changes on her own although I'm not sure that she is understanding exactly how this should be done. Nonetheless I do believe the gentamicin may be causing too much maceration she really has not wanted to utilize the silver alginate dressings past due to a previous issue with this. Overall her erythema of the left lower extremity is improved she still has significant swelling. No fevers, chills, nausea, or vomiting noted at this time. 02/10/2017 -- Old notes received -- was seen in 2009 by [facility] by Dr. Romie Levee -- his impression was chronic patches of dermatitis on the left lower extremity and current mild swelling. He recommended venous insufficiency venous reflux studies. She had no evidence of arterial insufficiency. Makin, Shalamar F. ([id number]) Follow-up chronic venous insufficiency study done on 12/16/2007, showed very mild amount of reflux on the great saphenous vein at the knee but the function of the ve in is normal and there was no evidence by the venous reflux study.. They referred her to dermatology for a second opinion and would see her back in about 4-6 weeks. She was seen back in follow-up in January 01 2008 and at that time she was asked to use a steroid ointment locally and the wounds are almost completely healed. The patient has told me today that she does not tolerate silver dressing -- and she has not had home health come and change her dressings over the last  week. Allergies Augmentin, calcium channel blockers, nitrofurantoin, ACE Inhibitors, atorvastatin, Beta-Blockers (Beta- Adrenergic Blocking  Agts), Levaquin, pravastatin, Statins-Hmg-Coa Reductase Inhibitors, sulfa, clindamycin, lincomycin, SILVER Objective Constitutional Pulse regular. Respirations normal and unlabored. Afebrile. Vitals Time Taken: 1:35 PM, Height: 63 in, Weight: 164.8 lbs, BMI: 29.2, Pulse: 80 bpm, Respiratory Rate: 16 breaths/min, Blood Pressure: 121/94 mmHg. Eyes Nonicteric. Reactive to light. Ears, Nose, Mouth, and Throat Lips, teeth, and gums WNL.Marland Kitchen Moist mucosa without lesions. Neck supple and nontender. No palpable supraclavicular or cervical adenopathy. Normal sized without goiter. Respiratory WNL. No retractions.. Cardiovascular Pedal Pulses WNL. No clubbing, cyanosis or edema. Lymphatic No adneopathy. No adenopathy. No adenopathy. Musculoskeletal Pettus, Asa F. ([id number]) Adexa without tenderness or enlargement.. Digits and nails w/o clubbing, cyanosis, infection, petechiae, ischemia, or inflammatory conditions.Marland Kitchen Psychiatric Judgement and insight Intact.. No evidence of depression, anxiety, or agitation.. General Notes: the lymphedema is excessive and there is no purulent drainage from the wound nor is there any evidence of gross ascending cellulitis. Integumentary (Hair, Skin) No suspicious lesions. No crepitus or fluctuance. No peri-wound warmth or erythema. No masses.. Wound #1 status is Open. Original cause of wound was Gradually Appeared. The wound is located on the Left,Lateral Lower Leg. The wound measures 5cm length x 3.5cm width x 0.1cm depth; 13.744cm^2 area and 1.374cm^3 volume. There is Fat Layer (Subcutaneous Tissue) Exposed exposed. There is no tunneling or undermining noted. There is a large amount of serous drainage noted. The wound margin is flat and intact. There is large (67-100%) granulation within the wound bed. There is a small  (1-33%) amount of necrotic tissue within the wound bed including Adherent Slough. The periwound skin appearance exhibited: Excoriation, Maceration, Erythema. The periwound skin appearance did not exhibit: Callus, Crepitus, Induration, Rash, Scarring, Dry/Scaly, Atrophie Blanche, Cyanosis, Ecchymosis, Hemosiderin Staining, Mottled, Pallor, Rubor. The surrounding wound skin color is noted with erythema which is circumferential. Periwound temperature was noted as No Abnormality. The periwound has tenderness on palpation. Assessment Active Problems ICD-10 E11.622 - Type 2 diabetes mellitus with other skin ulcer L03.116 - Cellulitis of left lower limb I10 - Essential (primary) hypertension I89.0 - Lymphedema, not elsewhere classified Plan Wound Cleansing: Wound #1 Left,Lateral Lower Leg: Clean wound with wound cleanser. Cleanse wound with mild soap and water Anesthetic: Wound #1 Left,Lateral Lower Leg: Hussey, Avonelle F. ([id number]) Topical Lidocaine 4% cream applied to wound bed prior to debridement Skin Barriers/Peri-Wound Care: Wound #1 Left,Lateral Lower Leg: Barrier cream Primary Wound Dressing: Wound #1 Left,Lateral Lower Leg: Aquacel - calcium aginate Secondary Dressing: Wound #1 Left,Lateral Lower Leg: ABD pad Dry Gauze Drawtex Dressing Change Frequency: Change Dressing Monday, Wednesday, Friday - HHRN to change dressing Monday and Wednesday and pt will come to the wound care center on Friday Follow-up Appointments: Wound #1 Left,Lateral Lower Leg: Return Appointment in 1 week. Edema Control: Wound #1 Left,Lateral Lower Leg: Kerlix and Coban - Left Lower Extremity - unna to anchor Elevate legs to the level of the heart and pump ankles as often as possible Home Health: Wound #1 Left,Lateral Lower Leg: [place] for [place] Nurse may visit PRN to address patient s wound care needs. FACE TO FACE ENCOUNTER: MEDICARE and MEDICAID PATIENTS: I  certify that this patient is under my care and that I had a face-to-face encounter that meets the physician face-to-face encounter requirements with this patient on this date. The encounter with the patient was in whole or in part for the following MEDICAL CONDITION: (primary reason for [place]) MEDICAL NECESSITY: I certify, that based on my findings, NURSING services are a medically necessary home  health service. HOME BOUND STATUS: I certify that my clinical findings support that this patient is homebound (i.e., Due to illness or injury, pt requires aid of supportive devices such as crutches, cane, wheelchairs, walkers, the use of special transportation or the assistance of another person to leave their place of residence. There is a normal inability to leave the home and doing so requires considerable and taxing effort. Other absences are for medical reasons / religious services and are infrequent or of short duration when for other reasons). If current dressing causes regression in wound condition, may D/C ordered dressing product/s and apply Normal Saline Moist Dressing daily until next [place] / Other MD appointment. [place] of regression in wound condition at [phone]. Please direct any NON-WOUND related issues/requests for orders to patient's Primary Care Physician The patient has not had home health come and change her dressings last week and I have urged her to let us organize for home health, so that the dressing can be changed more often. She also tells me that she has a vascular study to be done this coming Monday Blackham, Ruthene F. ([id number]) The patient continues to have a lot of lymphedema and the wound is a bit macerated on review today. After review today I have recommended: 1. Elevation and exercise which is very important for her to reduce her lymphedema as she cannot wear any compression wraps 2. Calcium alginate, Drawtex and a light  kerlix wrap to hold this in place, to be changed 3 times a week 3. Appropriate proteins, vitamin A, vitamin C and zinc 4. Regular visits the wound center Electronic Signature(s) Signed: 02/10/2017 4:47:57 PM By: Christin Fudge MD, FACS Previous Signature: 02/10/2017 3:48:30 PM Version By: Christin Fudge MD, FACS Entered By: Christin Fudge on 02/10/2017 15:49:30 Kunath, Alainna F. ([id number]) -------------------------------------------------------------------------------- SuperBill Details Patient Name: Cherney, Dezaree F. Date of Service: 02/10/2017 Medical Record Number: [id number] Patient Account Number: [id number] Date of Birth/Sex: [birth year] (83 y.o. Female) Treating RN: Carolyne Fiscal, Debi Primary Care Provider: Halina Maidens Other Clinician: Referring Provider: Halina Maidens Treating Provider/Extender: Frann Rider in Treatment: 6 Diagnosis Coding ICD-10 Codes Code Description [phone] Type 2 diabetes mellitus with other skin ulcer L03.116 Cellulitis of left lower limb I10 Essential (primary) hypertension I89.0 Lymphedema, not elsewhere classified Facility Procedures CPT4 Code: [id number] Description: 99213 - WOUND CARE VISIT-LEV 3 EST PT Modifier: Quantity: 1 Physician Procedures CPT4 Code: [id number] Description: 37482 - WC PHYS LEVEL 3 - EST PT ICD-10 Description Diagnosis E11.622 Type 2 diabetes mellitus with other skin ulcer I89.0 Lymphedema, not elsewhere classified I10 Essential (primary) hypertension Modifier: Quantity: 1 Electronic Signature(s) Signed: 02/10/2017 3:48:30 PM By: Christin Fudge MD, FACS Signed: 02/10/2017 4:07:04 PM By: Alric Quan Entered By: Alric Quan on 02/10/2017 15:12:25

## 2017-02-12 NOTE — Progress Notes (Signed)
HEM, [name] ([id number]) Visit Report for 02/10/2017 Allergy List Details [name], [name] Date of Service: 02/10/2017 1:30 PM Medical Record Number: [id number] Patient Account Number: [id number] Date of Birth/Sex: 07-21-1924 (83 y.o. Female) Treating RN: Ahmed Prima Primary Care Rice Walsh: Halina Maidens Other Clinician: Referring Simcha Farrington: Halina Maidens Treating Bogdan Vivona/Extender: Frann Rider in Treatment: 6 Allergies Active Allergies Augmentin calcium channel blockers nitrofurantoin ACE Inhibitors atorvastatin Beta-Blockers (Beta-Adrenergic Blocking Agts) Levaquin pravastatin Statins-Hmg-Coa Reductase Inhibitors sulfa clindamycin lincomycin SILVER Allergy Notes Electronic Signature(s) Signed: 02/10/2017 4:07:04 PM By: Alric Quan Entered By: Alric Quan on 02/10/2017 14:59:16 Viruet, Kately F. ([id number]) Mccomber, Eilene F. ([id number]) -------------------------------------------------------------------------------- Arrival Information Details [name], [name] Date of Service: 02/10/2017 1:30 PM Medical Record Number: [id number] Patient Account Number: [id number] Date of Birth/Sex: July 11, 1924 (83 y.o. Female) Treating RN: Carolyne Fiscal, Debi Primary Care Elanor Cale: Halina Maidens Other Clinician: Referring Jerianne Anselmo: Halina Maidens Treating Gean Laursen/Extender: Frann Rider in Treatment: 6 Visit Information Patient Arrived: Lyndel Pleasure Time: 13:34 Accompanied By: friend Transfer Assistance: EasyPivot Patient Lift Patient Identification Verified: Yes Secondary Verification Process Yes Completed: Patient Requires Transmission- No Based Precautions: Patient Has Alerts: Yes Patient Alerts: DM II History Since Last Visit All ordered tests and consults were completed: No Added or deleted any medications: No Any new allergies or adverse reactions: No Had a fall or experienced change in activities of daily living that may affect risk  of falls: No Signs or symptoms of abuse/neglect since last visito No Hospitalized since last visit: No Has Dressing in Place as Prescribed: Yes Electronic Signature(s) Signed: 02/10/2017 4:07:04 PM By: Alric Quan Entered By: Alric Quan on 02/10/2017 13:35:20 Spieler, Aireana F. ([id number]) -------------------------------------------------------------------------------- Clinic Level of Care Assessment Details [name], [name] Date of Service: 02/10/2017 1:30 PM Medical Record Number: [id number] Patient Account Number: [id number] Date of Birth/Sex: 01-24-1925 (83 y.o. Female) Treating RN: Carolyne Fiscal, Debi Primary Care Sandar Krinke: Halina Maidens Other Clinician: Referring Miasia Crabtree: Halina Maidens Treating Guillermina Shaft/Extender: Frann Rider in Treatment: 6 Clinic Level of Care Assessment Items TOOL 4 Quantity Score X - Use when only an EandM is performed on FOLLOW-UP visit 1 0 ASSESSMENTS - Nursing Assessment / Reassessment X - Reassessment of Co-morbidities (includes updates in patient status) 1 10 X - Reassessment of Adherence to Treatment Plan 1 5 ASSESSMENTS - Wound and Skin Assessment / Reassessment X - Simple Wound Assessment / Reassessment - one wound 1 5 []  - Complex Wound Assessment / Reassessment - multiple wounds 0 []  - Dermatologic / Skin Assessment (not related to wound area) 0 ASSESSMENTS - Focused Assessment []  - Circumferential Edema Measurements - multi extremities 0 []  - Nutritional Assessment / Counseling / Intervention 0 []  - Lower Extremity Assessment (monofilament, tuning fork, pulses) 0 []  - Peripheral Arterial Disease Assessment (using hand held doppler) 0 ASSESSMENTS - Ostomy and/or Continence Assessment and Care []  - Incontinence Assessment and Management 0 []  - Ostomy Care Assessment and Management (repouching, etc.) 0 PROCESS - Coordination of Care []  - Simple Patient / Family Education for ongoing care 0 X - Complex (extensive) Patient /  Family Education for ongoing care 1 20 X - Staff obtains Programmer, systems, Records, Test Results / Process Orders 1 10 X - Staff telephones HHA, Nursing Homes / Clarify orders / etc 1 10 []  - Routine Transfer to another Facility (non-emergent condition) 0 [place]land, Ryanna F. ([id number]) []  - Routine Hospital Admission (non-emergent condition) 0 []  - New Admissions / Insurance Authorizations / Ordering NPWT, Apligraf, etc. 0 []  -  Emergency Hospital Admission (emergent condition) 0 X - Simple Discharge Coordination 1 10 []  - Complex (extensive) Discharge Coordination 0 PROCESS - Special Needs []  - Pediatric / Minor Patient Management 0 []  - Isolation Patient Management 0 []  - Hearing / Language / Visual special needs 0 []  - Assessment of Community assistance (transportation, D/C planning, etc.) 0 []  - Additional assistance / Altered mentation 0 []  - Support Surface(s) Assessment (bed, cushion, seat, etc.) 0 INTERVENTIONS - Wound Cleansing / Measurement X - Simple Wound Cleansing - one wound 1 5 []  - Complex Wound Cleansing - multiple wounds 0 X - Wound Imaging (photographs - any number of wounds) 1 5 []  - Wound Tracing (instead of photographs) 0 X - Simple Wound Measurement - one wound 1 5 []  - Complex Wound Measurement - multiple wounds 0 INTERVENTIONS - Wound Dressings []  - Small Wound Dressing one or multiple wounds 0 []  - Medium Wound Dressing one or multiple wounds 0 X - Large Wound Dressing one or multiple wounds 1 20 X - Application of Medications - topical 1 5 []  - Application of Medications - injection 0 INTERVENTIONS - Miscellaneous []  - External ear exam 0 West, Korryn F. ([id number]) []  - Specimen Collection (cultures, biopsies, blood, body fluids, etc.) 0 []  - Specimen(s) / Culture(s) sent or taken to Lab for analysis 0 []  - Patient Transfer (multiple staff / Harrel Lemon Lift / Similar devices) 0 []  - Simple Staple / Suture removal (25 or less) 0 []  - Complex Staple / Suture removal (26 or  more) 0 []  - Hypo / Hyperglycemic Management (close monitor of Blood Glucose) 0 []  - Ankle / Brachial Index (ABI) - do not check if billed separately 0 X - Vital Signs 1 5 Has the patient been seen at the hospital within the last three years: Yes Total Score: 115 Level Of Care: New/Established - Level 3 Electronic Signature(s) Signed: 02/10/2017 4:07:04 PM By: Alric Quan Entered By: Alric Quan on 02/10/2017 15:12:18 Eskenazi, Jeri F. ([id number]) -------------------------------------------------------------------------------- Encounter Discharge Information Details Patient Name: Bradby, Chelise F. Date of Service: 02/10/2017 1:30 PM Medical Record Number: [id number] Patient Account Number: [id number] Date of Birth/Sex: 1924/07/12 (83 y.o. Female) Treating RN: Carolyne Fiscal, Debi Primary Care Sage Hammill: Halina Maidens Other Clinician: Referring Willene Holian: Halina Maidens Treating Koda Routon/Extender: Frann Rider in Treatment: 6 Encounter Discharge Information Items Discharge Pain Level: 0 Discharge Condition: Stable Ambulatory Status: Cane Discharge Destination: Home Transportation: Private Auto Accompanied By: friend Schedule Follow-up Appointment: Yes Medication Reconciliation completed No and provided to Patient/Care Eileen Croswell: Provided on Clinical Summary of Care: 02/10/2017 Form Type Recipient Paper Patient [facility] Electronic Signature(s) Signed: 02/10/2017 4:07:04 PM By: Alric Quan Entered By: Alric Quan on 02/10/2017 15:08:02 Caesar, Jonne F. ([id number]) -------------------------------------------------------------------------------- Lower Extremity Assessment Details Patient Name: Shedd, Jaelen F. Date of Service: 02/10/2017 1:30 PM Medical Record Number: [id number] Patient Account Number: [id number] Date of Birth/Sex: 1925-03-13 (83 y.o. Female) Treating RN: Carolyne Fiscal, Debi Primary Care Liliana Dang: Halina Maidens Other Clinician: Referring Seanne Chirico: Halina Maidens Treating Shayne Diguglielmo/Extender: Frann Rider in Treatment: 6 Edema Assessment Assessed: Shirlyn Goltz: No] [Right: No] E[Left: dema] [Right: :] Calf Left: Right: Point of Measurement: 27 cm From Medial Instep cm cm Ankle Left: Right: Point of Measurement: 10 cm From Medial Instep cm cm Vascular Assessment Pulses: Dorsalis Pedis Palpable: [Left:Yes] Posterior Tibial Extremity colors, hair growth, and conditions: Extremity Color: [Left:Red] Temperature of Extremity: [Left:Warm] Capillary Refill: [Left:< 3 seconds] Toe Nail Assessment Left: Right: Thick: No Discolored: No Deformed: No Improper Length and Hygiene:  No Electronic Signature(s) Signed: 02/10/2017 4:07:04 PM By: Alric Quan Entered By: Alric Quan on 02/10/2017 13:56:31 Poellnitz, Ahliyah F. ([id number]) -------------------------------------------------------------------------------- Multi Wound Chart Details Patient Name: Schoening, Tarica F. Date of Service: 02/10/2017 1:30 PM Medical Record Number: [id number] Patient Account Number: [id number] Date of Birth/Sex: 13-Apr-1925 (83 y.o. Female) Treating RN: Ahmed Prima Primary Care Purva Vessell: Halina Maidens Other Clinician: Referring Alejo Beamer: Halina Maidens Treating Skylar Priest/Extender: Frann Rider in Treatment: 6 Vital Signs Height(in): 63 Pulse(bpm): 80 Weight(lbs): 164.8 Blood Pressure 121/94 (mmHg): Body Mass Index(BMI): 29 Temperature(F): Respiratory Rate 16 (breaths/min): Photos: [1:No Photos] [N/A:N/A] Wound Location: [1:Left Lower Leg - Lateral] [N/A:N/A] Wounding Event: [1:Gradually Appeared] [N/A:N/A] Primary Etiology: [1:Diabetic Wound/Ulcer of the Lower Extremity] [N/A:N/A] Comorbid History: [1:Type II Diabetes] [N/A:N/A] Date Acquired: [1:12/16/2016] [N/A:N/A] Weeks of Treatment: [1:6] [N/A:N/A] Wound Status: [1:Open] [N/A:N/A] Measurements L x W x D 5x3.5x0.1 [N/A:N/A] (cm) Area (cm) : [1:13.744] [N/A:N/A] Volume (cm) :  [1:1.374] [N/A:N/A] % Reduction in Area: [1:-1299.60%] [N/A:N/A] % Reduction in Volume: -1302.00% [N/A:N/A] Classification: [1:Grade 1] [N/A:N/A] Exudate Amount: [1:Large] [N/A:N/A] Exudate Type: [1:Serous] [N/A:N/A] Exudate Color: [1:amber] [N/A:N/A] Wound Margin: [1:Flat and Intact] [N/A:N/A] Granulation Amount: [1:Large (67-100%)] [N/A:N/A] Necrotic Amount: [1:Small (1-33%)] [N/A:N/A] Exposed Structures: [1:Fat Layer (Subcutaneous Tissue) Exposed: Yes Fascia: No Tendon: No Muscle: No Joint: No Bone: No] [N/A:N/A] Epithelialization: [1:None] [N/A:N/A] Periwound Skin Texture: Excoriation: Yes N/A N/A Induration: No Callus: No Crepitus: No Rash: No Scarring: No Periwound Skin Maceration: Yes N/A N/A Moisture: Dry/Scaly: No Periwound Skin Color: Erythema: Yes N/A N/A Atrophie Blanche: No Cyanosis: No Ecchymosis: No Hemosiderin Staining: No Mottled: No Pallor: No Rubor: No Erythema Location: Circumferential N/A N/A Temperature: No Abnormality N/A N/A Tenderness on Yes N/A N/A Palpation: Wound Preparation: Ulcer Cleansing: N/A N/A Rinsed/Irrigated with Saline Topical Anesthetic Applied: Other: lidocaine 4% Treatment Notes Electronic Signature(s) Signed: 02/10/2017 3:48:30 PM By: Christin Fudge MD, FACS Entered By: Christin Fudge on 02/10/2017 14:30:07 Quaranta, Ahmani F. ([id number]) -------------------------------------------------------------------------------- [place] Details Patient Name: [name], Ashonte F. Date of Service: 02/10/2017 1:30 PM Medical Record Number: [id number] Patient Account Number: [id number] Date of Birth/Sex: February 28, 1925 (83 y.o. Female) Treating RN: Carolyne Fiscal, Debi Primary Care Clemon Devaul: Halina Maidens Other Clinician: Referring Dally Oshel: Halina Maidens Treating Mikaia Janvier/Extender: Frann Rider in Treatment: 6 Active Inactive ` Abuse / Safety / Falls / Self Care Management Nursing Diagnoses: Potential for  falls Goals: Patient will not experience any injury related to falls Date Initiated: 12/30/2016 Target Resolution Date: 04/29/2017 Goal Status: Active Interventions: Assess Activities of Daily Living upon admission and as needed Assess: immobility, friction, shearing, incontinence upon admission and as needed Notes: ` Nutrition Nursing Diagnoses: Imbalanced nutrition Impaired glucose control: actual or potential Potential for alteratiion in Nutrition/Potential for imbalanced nutrition Goals: Patient/caregiver will maintain therapeutic glucose control Date Initiated: 12/30/2016 Target Resolution Date: 04/29/2017 Goal Status: Active Interventions: Assess patient nutrition upon admission and as needed per policy Notes: ` Orientation to the [place], [name] ([id number]) Nursing Diagnoses: Knowledge deficit related to the wound healing center program Goals: Patient/caregiver will verbalize understanding of the [place] Date Initiated: 12/30/2016 Target Resolution Date: 01/21/2017 Goal Status: Active Interventions: Provide education on orientation to the wound center Notes: ` Pain, Acute or Chronic Nursing Diagnoses: Pain, acute or chronic: actual or potential Potential alteration in comfort, pain Goals: Patient/caregiver will verbalize adequate pain control between visits Date Initiated: 12/30/2016 Target Resolution Date: 04/29/2017 Goal Status: Active Interventions: Complete pain assessment as per visit requirements Notes: ` Wound/Skin Impairment Nursing Diagnoses: Impaired  tissue integrity Knowledge deficit related to smoking impact on wound healing Knowledge deficit related to ulceration/compromised skin integrity Goals: Ulcer/skin breakdown will have a volume reduction of 80% by week 12 Date Initiated: 12/30/2016 Target Resolution Date: 04/22/2017 Goal Status: Active Interventions: Assess patient/caregiver ability to perform  ulcer/skin care regimen upon admission and as needed Notes: ALDENE, HENDON ([id number]) Electronic Signature(s) Signed: 02/10/2017 4:07:04 PM By: Alric Quan Entered By: Alric Quan on 02/10/2017 13:45:47 Espino, Lun F. ([id number]) -------------------------------------------------------------------------------- Pain Assessment Details Patient Name: Dolce, Kenlee F. Date of Service: 02/10/2017 1:30 PM Medical Record Number: [id number] Patient Account Number: [id number] Date of Birth/Sex: 24-Sep-1924 (83 y.o. Female) Treating RN: Carolyne Fiscal, Debi Primary Care Shakirah Kirkey: Halina Maidens Other Clinician: Referring Mattelyn Imhoff: Halina Maidens Treating Vallie Teters/Extender: Frann Rider in Treatment: 6 Active Problems Location of Pain Severity and Description of Pain Patient Has Paino No Site Locations Pain Management and Medication Current Pain Management: Electronic Signature(s) Signed: 02/10/2017 4:07:04 PM By: Alric Quan Entered By: Alric Quan on 02/10/2017 13:35:25 Gruetzmacher, Shaida F. ([id number]) -------------------------------------------------------------------------------- Patient/Caregiver Education Details Patient Name: [name], Rockie F. Date of Service: 02/10/2017 1:30 PM Medical Record Number: [id number] Patient Account Number: [id number] Date of Birth/Gender: January 10, 1925 (83 y.o. Female) Treating RN: Ahmed Prima Primary Care Physician: Halina Maidens Other Clinician: Referring Physician: Halina Maidens Treating Physician/Extender: Frann Rider in Treatment: 6 Education Assessment Education Provided To: Patient Education Topics Provided Wound/Skin Impairment: Handouts: Other: change dressing as ordered Methods: Demonstration, Explain/Verbal Responses: State content correctly Electronic Signature(s) Signed: 02/10/2017 4:07:04 PM By: Alric Quan Entered By: Alric Quan on 02/10/2017 15:08:13 Cedrone, Vici F.  ([id number]) -------------------------------------------------------------------------------- Wound Assessment Details Patient Name: Buckel, Duha F. Date of Service: 02/10/2017 1:30 PM Medical Record Number: [id number] Patient Account Number: [id number] Date of Birth/Sex: 14-Jun-1925 (83 y.o. Female) Treating RN: Carolyne Fiscal, Debi Primary Care Laxmi Choung: Halina Maidens Other Clinician: Referring Oliveah Zwack: Halina Maidens Treating Delrick Dehart/Extender: Frann Rider in Treatment: 6 Wound Status Wound Number: 1 Primary Diabetic Wound/Ulcer of the Lower Etiology: Extremity Wound Location: Left Lower Leg - Lateral Wound Status: Open Wounding Event: Gradually Appeared Comorbid Type II Diabetes Date Acquired: 12/16/2016 History: Weeks Of Treatment: 6 Clustered Wound: No Photos Photo Uploaded By: Alric Quan on 02/10/2017 16:03:27 Wound Measurements Length: (cm) 5 Width: (cm) 3.5 Depth: (cm) 0.1 Area: (cm) 13.744 Volume: (cm) 1.374 % Reduction in Area: -1299.6% % Reduction in Volume: -1302% Epithelialization: None Tunneling: No Undermining: No Wound Description Classification: Grade 1 Wound Margin: Flat and Intact Exudate Amount: Large Exudate Type: Serous Exudate Color: amber Foul Odor After Cleansing: No Slough/Fibrino Yes Wound Bed Granulation Amount: Large (67-100%) Exposed Structure Necrotic Amount: Small (1-33%) Fascia Exposed: No Necrotic Quality: Adherent Slough Fat Layer (Subcutaneous Tissue) Exposed: Yes Tendon Exposed: No Burgher, Char F. ([id number]) Muscle Exposed: No Joint Exposed: No Bone Exposed: No Periwound Skin Texture Texture Color No Abnormalities Noted: No No Abnormalities Noted: No Callus: No Atrophie Blanche: No Crepitus: No Cyanosis: No Excoriation: Yes Ecchymosis: No Induration: No Erythema: Yes Rash: No Erythema Location: Circumferential Scarring: No Hemosiderin Staining: No Mottled: No Moisture Pallor: No No Abnormalities  Noted: No Rubor: No Dry / Scaly: No Maceration: Yes Temperature / Pain Temperature: No Abnormality Tenderness on Palpation: Yes Wound Preparation Ulcer Cleansing: Rinsed/Irrigated with Saline Topical Anesthetic Applied: Other: lidocaine 4%, Treatment Notes Wound #1 (Left, Lateral Lower Leg) 1. Cleansed with: Clean wound with Normal Saline 2. Anesthetic Topical Lidocaine 4% cream to wound bed prior to debridement 3. Peri-wound Care: Barrier cream 4. Dressing Applied: Calcium Alginate 5. Secondary  Dressing Applied ABD Pad Dry Gauze 7. Secured with Tape Notes kerlix, coban, unna to anchor, drawtex Electronic Signature(s) Signed: 02/10/2017 4:07:04 PM By: Alric Quan Entered By: Alric Quan on 02/10/2017 13:43:44 Banwart, Kamara F. ([id number]) Calabretta, Orvella F. ([id number]) -------------------------------------------------------------------------------- Vitals Details Patient Name: Hemm, Laelia F. Date of Service: 02/10/2017 1:30 PM Medical Record Number: [id number] Patient Account Number: [id number] Date of Birth/Sex: [birth year] (83 y.o. Female) Treating RN: Carolyne Fiscal, Debi Primary Care Lilyanne Mcquown: Halina Maidens Other Clinician: Referring [place]: Halina Maidens Treating Zion Ta/Extender: Frann Rider in Treatment: 6 Vital Signs Time Taken: 13:35 Pulse (bpm): 80 Height (in): 63 Respiratory Rate (breaths/min): 16 Weight (lbs): 164.8 Blood Pressure (mmHg): 121/94 Body Mass Index (BMI): 29.2 Reference Range: 80 - 120 mg / dl Electronic Signature(s) Signed: 02/10/2017 4:07:04 PM By: Alric Quan Entered By: Alric Quan on 02/10/2017 13:38:42

## 2017-02-13 ENCOUNTER — Other Ambulatory Visit (INDEPENDENT_AMBULATORY_CARE_PROVIDER_SITE_OTHER): Payer: Self-pay | Admitting: Vascular Surgery

## 2017-02-13 ENCOUNTER — Encounter (INDEPENDENT_AMBULATORY_CARE_PROVIDER_SITE_OTHER): Payer: Self-pay

## 2017-02-13 ENCOUNTER — Other Ambulatory Visit: Payer: Self-pay | Admitting: Physician Assistant

## 2017-02-13 ENCOUNTER — Ambulatory Visit (INDEPENDENT_AMBULATORY_CARE_PROVIDER_SITE_OTHER): Payer: Medicare PPO

## 2017-02-13 DIAGNOSIS — M79606 Pain in leg, unspecified: Secondary | ICD-10-CM

## 2017-02-13 DIAGNOSIS — S81809A Unspecified open wound, unspecified lower leg, initial encounter: Secondary | ICD-10-CM

## 2017-02-13 DIAGNOSIS — I739 Peripheral vascular disease, unspecified: Secondary | ICD-10-CM

## 2017-02-15 ENCOUNTER — Telehealth: Payer: Self-pay

## 2017-02-15 NOTE — Telephone Encounter (Signed)
Silvia from [place] called about pt requesting to get verbal orders for visits with pt on Monday & Weds for chnaging her dressing on her Left Leg. Also, requesting verbal orders for pt to receive PT/OT evaluation. We declined approval for dressing change because the wound clinic would need to approve that for her but PT/OT is approved per Dr Army Melia.

## 2017-02-17 ENCOUNTER — Encounter: Payer: Medicare PPO | Admitting: Surgery

## 2017-02-17 DIAGNOSIS — E11622 Type 2 diabetes mellitus with other skin ulcer: Secondary | ICD-10-CM | POA: Diagnosis not present

## 2017-02-19 NOTE — Progress Notes (Signed)
EHRSAM, [name] ([id number]) Visit Report for 02/17/2017 Chief Complaint Document Details [name], [name] Date of Service: 02/17/2017 1:30 PM Medical Record Number: [id number] Patient Account Number: [id number] Date of Birth/Sex: [birth year] (83 y.o. Female) Treating RN: Ahmed Prima Primary Care Provider: Halina Maidens Other Clinician: Referring Provider: Halina Maidens Treating Provider/Extender: Frann Rider in Treatment: 7 Information Obtained from: Patient Chief Complaint Left lower leg cellulitis Electronic Signature(s) Signed: 02/17/2017 3:31:36 PM By: Christin Fudge MD, FACS Entered By: Christin Fudge on 02/17/2017 14:12:50 [name], [name] ([id number]) -------------------------------------------------------------------------------- HPI Details [name], [name] Date of Service: 02/17/2017 1:30 PM Medical Record Number: [id number] Patient Account Number: [id number] Date of Birth/Sex: Aug 16, 1924 (83 y.o. Female) Treating RN: Carolyne Fiscal, Debi Primary Care Provider: Halina Maidens Other Clinician: Referring Provider: Halina Maidens Treating Provider/Extender: Frann Rider in Treatment: 7 History of Present Illness HPI Description: 12/30/16 Patient presents today for initial evaluation concerning an area over her left lateral ankle which she tells me has been intermittent in nature since 2000. However the current opening has been present for about two weeks. She has been tolerating the dressing changes at home with antibiotic ointment but that is really the only treatment that has been initiated at this point. That is other than the oral antibiotics which was Keflex that patient was placed on by her primary care provider prior to being referred to [place]. She does have some discomfort but rates this to be around a 2-3 out of 10 fortunately the redness does not seem to be spreading to any other location as far as her lower extremity is concerned. She does  tell me that in the past antibiotics have been beneficial for her unfortunately the current antibiotics have not been of benefit and no wound culture was performed as of yet. She has previously seen Dr. 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 She does have diabetes and are most recent blood sugars have run between 104 and 106 according to patient's although her last hemoglobin A1c she is unaware of. Patient's current wound does not appear to be significantly open but is rather more of a weeping region of cellulitis. 01/27/17 on evaluation today patient left lower for many wound continues to show signs of erythema surrounding and in fact she is noted to have erythema from her toes up to just below the knee in regard to left lower extremity. She is not having any fevers, chills, nausea, vomiting, or diarrhea at this point. With that being said she is concerned about the silver alginate dressing. She tells me that in the past she used silver and some of the dressings for previous one and did not do well with it. Nonetheless she has discomfort along the wound itself but no other pain noted throughout the left lower extremity. 02/03/17 on evaluation today patient appears to be doing fairly well overall other than the fact that her wound is worsening on the lateral aspect of her left lower extremity. It appears more macerated and even slough covered at this point. With that being said she has been attempting to perform the dressing changes on her own although I'm not sure that she is understanding exactly how this should be done. Nonetheless I do believe the gentamicin may be causing too much maceration she really has not wanted to utilize  the silver alginate dressings past due to a previous issue with this. Overall her erythema  of the left lower extremity is improved she still has significant swelling. No fevers, chills, nausea, or vomiting noted at this time. 02/10/2017 -- Old notes received -- was seen in 2009 by [facility] by Dr. Romie Levee -- his impression was chronic patches of dermatitis on the left lower extremity and current mild swelling. He recommended venous insufficiency venous reflux studies. She had no evidence of arterial insufficiency. Follow-up chronic venous insufficiency study done on 12/16/2007, showed very mild amount of reflux on the great saphenous vein at the knee but the function of the vein is normal and there was no evidence by the venous reflux study.. They referred her to dermatology for a second opinion and would see her back in about 4-6 weeks. She was seen back in follow-up in January 01 2008 and at that time she was asked to use a steroid ointment locally and the wounds are almost completely healed. [name], [name] The patient has told me today that she does not tolerate silver dressing -- and she has not had home health come and change her dressings over the last week. 02/17/2017 -- - had a lower arterial study done which showed a bilateral ABI suggests no significant lower extremity arterial disease and the toe brachial indicis were normal on the right and abnormal on the left. The right ABI was 1.0 and the left was 1.07. The right toe brachial indices was 0.81 on the right and 0.56 on the left. She had biphasic and triphasic flow at the tibial vessels. Electronic Signature(s) Signed: 02/17/2017 3:31:36 PM By: Christin Fudge MD, FACS Entered By: Christin Fudge on 02/17/2017 14:12:57 [name], [name] -------------------------------------------------------------------------------- Physical Exam Details Patient Name: [name], [name] Date of Service: 02/17/2017 1:30 PM Medical Record Number: [id number] Patient Account Number: [id number] Date of Birth/Sex: [birth year] (83  y.o. Female) Treating RN: Ahmed Prima Primary Care Provider: Halina Maidens Other Clinician: Referring Provider: Halina Maidens Treating Provider/Extender: Frann Rider in Treatment: 7 Constitutional . Pulse regular. Respirations normal and unlabored. Afebrile. . Eyes Nonicteric. Reactive to light. Ears, Nose, Mouth, and Throat Lips, teeth, and gums WNL.Marland Kitchen Moist mucosa without lesions. Neck supple and nontender. No palpable supraclavicular or cervical adenopathy. Normal sized without goiter. Respiratory WNL. No retractions.. Cardiovascular Pedal Pulses WNL. No clubbing, cyanosis or edema. Lymphatic No adneopathy. No adenopathy. No adenopathy. Musculoskeletal Adexa without tenderness or enlargement.. Digits and nails w/o clubbing, cyanosis, infection, petechiae, ischemia, or inflammatory conditions.. Integumentary (Hair, Skin) No suspicious lesions. No crepitus or fluctuance. No peri-wound warmth or erythema. No masses.Marland Kitchen Psychiatric Judgement and insight Intact.. No evidence of depression, anxiety, or agitation.. Notes the lymphedema is much better on the wound is not showing signs of weeping-like last week. Overall there has been some improvement. Electronic Signature(s) Signed: 02/17/2017 3:31:36 PM By: Christin Fudge MD, FACS Entered By: Christin Fudge on 02/17/2017 14:13:26 [name], [name] ([id number]) -------------------------------------------------------------------------------- Physician Orders Details Patient Name: Gacek, Yazlin F. Date of Service: 02/17/2017 1:30 PM Medical Record Number: [id number] Patient Account Number: [id number] Date of Birth/Sex: 07/02/1924 (83 y.o. Female) Treating RN: Ahmed Prima Primary Care Provider: Halina Maidens Other Clinician: Referring Provider: Halina Maidens Treating Provider/Extender: Frann Rider in Treatment: 7 Verbal / Phone Orders: No Diagnosis Coding Wound Cleansing Wound #1 Left,Lateral Lower Leg o  Clean wound with wound cleanser. o Cleanse wound with mild soap and water Anesthetic Wound #1 Left,Lateral Lower Leg o  Topical Lidocaine 4% cream applied to wound bed prior to debridement Skin Barriers/Peri-Wound Care Wound #1 Left,Lateral Lower Leg o Barrier cream Primary Wound Dressing Wound #1 Left,Lateral Lower Leg o Other: - PolyMem (NO SILVER) Secondary Dressing Wound #1 Left,Lateral Lower Leg o ABD pad o Drawtex Dressing Change Frequency o Change Dressing Monday, Wednesday, Friday - HHRN to change dressing Monday and Wednesday and pt will come to the wound care center on Friday. Follow-up Appointments Wound #1 Left,Lateral Lower Leg o Return Appointment in 1 week. Edema Control Wound #1 Left,Lateral Lower Leg o 3 Layer Compression System - Left Lower Extremity - Please wrap 3cm from toes and 3cm from knee. unna to anchor o Elevate legs to the level of the heart and pump ankles as often as possible Jablonsky, Anhar F. ([id number]) Additional Orders / Instructions Wound #1 Left,Lateral Lower Leg o Increase protein intake. Home Health Wound #1 Left,Lateral Lower Leg o Continue Home Health Visits - [facility] to change dressing Monday and Wednesday. Please order these supplies for the pt. o Home Health Nurse may visit PRN to address patientos wound care needs. o FACE TO FACE ENCOUNTER: MEDICARE and MEDICAID PATIENTS: I certify that this patient is under my care and that I had a face-to-face encounter that meets the physician face-to-face encounter requirements with this patient on this date. The encounter with the patient was in whole or in part for the following MEDICAL CONDITION: (primary reason for [place]) MEDICAL NECESSITY: I certify, that based on my findings, NURSING services are a medically necessary home health service. HOME BOUND STATUS: I certify that my clinical findings support that this patient is homebound (i.e., Due to illness or  injury, pt requires aid of supportive devices such as crutches, cane, wheelchairs, walkers, the use of special transportation or the assistance of another person to leave their place of residence. There is a normal inability to leave the home and doing so requires considerable and taxing effort. Other absences are for medical reasons / religious services and are infrequent or of short duration when for other reasons). o If current dressing causes regression in wound condition, may D/C ordered dressing product/s and apply Normal Saline Moist Dressing daily until next [place] / Other MD appointment. [place] of regression in wound condition at [phone]. o Please direct any NON-WOUND related issues/requests for orders to patient's Primary Care Physician Medications-please add to medication list. Wound #1 Left,Lateral Lower Leg o Other: - Vitamin A, Vitamin C, Zinc, MVI Electronic Signature(s) Signed: 02/17/2017 3:31:36 PM By: Christin Fudge MD, FACS Signed: 02/17/2017 4:51:13 PM By: Alric Quan Entered By: Alric Quan on 02/17/2017 14:01:19 [name], [name] ([id number]) -------------------------------------------------------------------------------- Problem List Details Patient Name: Mcneff, Shontay F. Date of Service: 02/17/2017 1:30 PM Medical Record Number: [id number] Patient Account Number: [id number] Date of Birth/Sex: Oct 27, 1924 (83 y.o. Female) Treating RN: Carolyne Fiscal, Debi Primary Care Provider: Halina Maidens Other Clinician: Referring Provider: Halina Maidens Treating Provider/Extender: Frann Rider in Treatment: 7 Active Problems ICD-10 Encounter Code Description Active Date Diagnosis E11.622 Type 2 diabetes mellitus with other skin ulcer 01/02/2017 Yes L03.116 Cellulitis of left lower limb 01/02/2017 Yes I10 Essential (primary) hypertension 01/02/2017 Yes I89.0 Lymphedema, not elsewhere classified 01/16/2017 Yes Inactive  Problems Resolved Problems Electronic Signature(s) Signed: 02/17/2017 3:31:36 PM By: Christin Fudge MD, FACS Entered By: Christin Fudge on 02/17/2017 14:12:34 [name], [name] ([id number]) -------------------------------------------------------------------------------- Progress Note Details [name], [name] Date of Service: 02/17/2017 1:30 PM Medical Record Number: [id number] Patient  Account Number: [id number] Date of Birth/Sex: [birth year] (83 y.o. Female) Treating RN: Carolyne Fiscal, Debi Primary Care Provider: Halina Maidens Other Clinician: Referring Provider: Halina Maidens Treating Provider/Extender: Frann Rider in Treatment: 7 Subjective Chief Complaint Information obtained from Patient Left lower leg cellulitis History of Present Illness (HPI) 12/30/16 Patient presents today for initial evaluation concerning an area over her left lateral ankle which she tells me has been intermittent in nature since 2000. However the current opening has been present for about two weeks. She has been tolerating the dressing changes at home with antibiotic ointment but that is really the only treatment that has been initiated at this point. That is other than the oral antibiotics which was Keflex that patient was placed on by her primary care provider prior to being referred to [place]. She does have some discomfort but rates this to be around a 2-3 out of 10 fortunately the redness does not seem to be spreading to any other location as far as her lower extremity is concerned. She does tell me that in the past antibiotics have been beneficial for her unfortunately the current antibiotics have not been of benefit and no wound culture was performed as of yet. She has previously seen Dr. 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 She does have diabetes and are most recent blood sugars have run between 104 and 106 according to patient's although her last hemoglobin A1c she is unaware of. Patient's current wound does not appear to be significantly open but is rather more of a weeping region of cellulitis. 01/27/17 on evaluation today patient left lower for many wound continues to show signs of erythema surrounding and in fact she is noted to have erythema from her toes up to just below the knee in regard to left lower extremity. She is not having any fevers, chills, nausea, vomiting, or diarrhea at this point. With that being said she is concerned about the silver alginate dressing. She tells me that in the past she used silver and some of the dressings for previous one and did not do well with it. Nonetheless she has discomfort along the wound itself but no other pain noted throughout the left lower extremity. 02/03/17 on evaluation today patient appears to be doing fairly well overall other than the fact that her wound is worsening on the lateral aspect of her left lower extremity. It appears more macerated and even slough covered at this point. With that being said she has been attempting to perform the dressing changes on her own although I'm not sure that she is understanding exactly how this should be done. Nonetheless I do believe the gentamicin may be causing too much maceration she really has not wanted to utilize the silver alginate dressings past due to a previous issue with this. Overall her erythema of the left lower extremity is improved she still has significant swelling. No fevers, chills, nausea, or vomiting noted at this time. 02/10/2017 -- Old notes received -- was seen in 2009 by [facility] by Dr. Romie Levee -- his impression was chronic patches of dermatitis on the left lower extremity and current mild swelling. He recommended venous insufficiency venous reflux studies. She had no evidence of  arterial insufficiency. Sperbeck, Kerrie F. ([id number]) Follow-up chronic venous insufficiency study done on 12/16/2007, showed very mild  amount of reflux on the great saphenous vein at the knee but the function of the vein is normal and there was no evidence by the venous reflux study.. They referred her to dermatology for a second opinion and would see her back in about 4-6 weeks. She was seen back in follow-up in January 01 2008 and at that time she was asked to use a steroid ointment locally and the wounds are almost completely healed. The patient has told me today that she does not tolerate silver dressing -- and she has not had home health come and change her dressings over the last week. 02/17/2017 -- - had a lower arterial study done which showed a bilateral ABI suggests no significant lower extremity arterial disease and the toe brachial indicis were normal on the right and abnormal on the left. The right ABI was 1.0 and the left was 1.07. The right toe brachial indices was 0.81 on the right and 0.56 on the left. She had biphasic and triphasic flow at the tibial vessels. Objective Constitutional Pulse regular. Respirations normal and unlabored. Afebrile. Vitals Time Taken: 1:36 PM, Height: 63 in, Weight: 164.8 lbs, BMI: 29.2, Pulse: 79 bpm, Respiratory Rate: 16 breaths/min, Blood Pressure: 174/89 mmHg. General Notes: Made Dr. Con Memos aware of pts BP. Eyes Nonicteric. Reactive to light. Ears, Nose, Mouth, and Throat Lips, teeth, and gums WNL.Marland Kitchen Moist mucosa without lesions. Neck supple and nontender. No palpable supraclavicular or cervical adenopathy. Normal sized without goiter. Respiratory WNL. No retractions.. Cardiovascular Pedal Pulses WNL. No clubbing, cyanosis or edema. Lymphatic No adneopathy. No adenopathy. No adenopathy. Musculoskeletal [name], [name] ([id number]) Adexa without tenderness or enlargement.. Digits and nails w/o clubbing, cyanosis, infection, petechiae, ischemia, or  inflammatory conditions.Marland Kitchen Psychiatric Judgement and insight Intact.. No evidence of depression, anxiety, or agitation.. General Notes: the lymphedema is much better on the wound is not showing signs of weeping-like last week. Overall there has been some improvement. Integumentary (Hair, Skin) No suspicious lesions. No crepitus or fluctuance. No peri-wound warmth or erythema. No masses.. Wound #1 status is Open. Original cause of wound was Gradually Appeared. The wound is located on the Left,Lateral Lower Leg. The wound measures 4.3cm length x 3cm width x 0.1cm depth; 10.132cm^2 area and 1.013cm^3 volume. There is Fat Layer (Subcutaneous Tissue) Exposed exposed. There is no tunneling or undermining noted. There is a large amount of serous drainage noted. The wound margin is flat and intact. There is large (67-100%) granulation within the wound bed. There is a small (1-33%) amount of necrotic tissue within the wound bed including Adherent Slough. The periwound skin appearance exhibited: Excoriation, Maceration, Erythema. The periwound skin appearance did not exhibit: Callus, Crepitus, Induration, Rash, Scarring, Dry/Scaly, Atrophie Blanche, Cyanosis, Ecchymosis, Hemosiderin Staining, Mottled, Pallor, Rubor. The surrounding wound skin color is noted with erythema which is circumferential. Periwound temperature was noted as No Abnormality. The periwound has tenderness on palpation. Assessment Active Problems ICD-10 E11.622 - Type 2 diabetes mellitus with other skin ulcer L03.116 - Cellulitis of left lower limb I10 - Essential (primary) hypertension I89.0 - Lymphedema, not elsewhere classified Plan Wound Cleansing: Wound #1 Left,Lateral Lower Leg: Clean wound with wound cleanser. Cleanse wound with mild soap and water Anesthetic: Wound #1 Left,Lateral Lower Leg: [name], [name] ([id number]) Topical Lidocaine 4% cream applied to wound bed prior to debridement Skin Barriers/Peri-Wound  Care: Wound #1 Left,Lateral Lower Leg: Barrier cream Primary Wound Dressing: Wound #1 Left,Lateral Lower Leg: Other: - PolyMem (NO SILVER) Secondary Dressing: Wound #1 Left,Lateral Lower  Leg: ABD pad Drawtex Dressing Change Frequency: Change Dressing Monday, Wednesday, Friday - HHRN to change dressing Monday and Wednesday and pt will come to the wound care center on Friday. Follow-up Appointments: Wound #1 Left,Lateral Lower Leg: Return Appointment in 1 week. Edema Control: Wound #1 Left,Lateral Lower Leg: 3 Layer Compression System - Left Lower Extremity - Please wrap 3cm from toes and 3cm from knee. unna to anchor Elevate legs to the level of the heart and pump ankles as often as possible Additional Orders / Instructions: Wound #1 Left,Lateral Lower Leg: Increase protein intake. Home Health: Wound #1 Left,Lateral Lower Leg: Continue Home Health Visits - HHRN to change dressing Monday and Wednesday. Please order these supplies for the pt. Home Health Nurse may visit PRN to address patient s wound care needs. FACE TO FACE ENCOUNTER: MEDICARE and MEDICAID PATIENTS: I certify that this patient is under my care and that I had a face-to-face encounter that meets the physician face-to-face encounter requirements with this patient on this date. The encounter with the patient was in whole or in part for the following MEDICAL CONDITION: (primary reason for [place]) MEDICAL NECESSITY: I certify, that based on my findings, NURSING services are a medically necessary home health service. HOME BOUND STATUS: I certify that my clinical findings support that this patient is homebound (i.e., Due to illness or injury, pt requires aid of supportive devices such as crutches, cane, wheelchairs, walkers, the use of special transportation or the assistance of another person to leave their place of residence. There is a normal inability to leave the home and doing so requires considerable and  taxing effort. Other absences are for medical reasons / religious services and are infrequent or of short duration when for other reasons). If current dressing causes regression in wound condition, may D/C ordered dressing product/s and apply Normal Saline Moist Dressing daily until next [place] / Other MD appointment. [place] of regression in wound condition at [phone]. Please direct any NON-WOUND related issues/requests for orders to patient's Primary Care Physician Medications-please add to medication list.: Wound #1 Left,Lateral Lower Leg: Other: - Vitamin A, Vitamin C, Zinc, MVI [name], [name] ([id number]) having confirmed that she has a normal arterial flow, I have reviewed details of this with her and recommended: 1. PolyMem foam, drawtext and a 3 layer Profore wrap to be changed 3 times a week. 2. if PolyMem is not available the home health can use calcium alginate 3. Elevation and exercise has been discussed with her 4. She will return to see [place] next week for a review. I have discussed at length with her that once her wounds have healed he will try and get her into compression stockings or compression wraps for a long-term basis. If her wounds however don't respond well she may need a venous duplex reflux study at a later date. Electronic Signature(s) Signed: 02/17/2017 3:31:36 PM By: Christin Fudge MD, FACS Entered By: Christin Fudge on 02/17/2017 14:26:11 [name], [name] ([id number]) -------------------------------------------------------------------------------- SuperBill Details Patient Name: Santosuosso, Teara F. Date of Service: 02/17/2017 Medical Record Number: [id number] Patient Account Number: [id number] Date of Birth/Sex: 10-Jan-1925 (83 y.o. Female) Treating RN: Ahmed Prima Primary Care Provider: Halina Maidens Other Clinician: Referring Provider: Halina Maidens Treating Provider/Extender: Frann Rider in Treatment: 7 Diagnosis  Coding ICD-10 Codes Code Description E11.622 Type 2 diabetes mellitus with other skin ulcer L03.116 Cellulitis of left lower limb I10 Essential (primary) hypertension I89.0 Lymphedema, not elsewhere classified Facility Procedures CPT4:  Description Modifier Quantity Code [id number] (Facility Use Only) [phone] - [place] LWR LT 1 LEG Physician Procedures CPT4 Code: [id number] Description: 76195 - WC PHYS LEVEL 3 - EST PT ICD-10 Description Diagnosis E11.622 Type 2 diabetes mellitus with other skin ulcer L03.116 Cellulitis of left lower limb I10 Essential (primary) hypertension I89.0 Lymphedema, not elsewhere classified Modifier: Quantity: 1 Electronic Signature(s) Signed: 02/17/2017 4:06:43 PM By: Christin Fudge MD, FACS Signed: 02/17/2017 4:51:13 PM By: Alric Quan Previous Signature: 02/17/2017 3:31:36 PM Version By: Christin Fudge MD, FACS Entered By: Alric Quan on 02/17/2017 15:40:20

## 2017-02-21 NOTE — Progress Notes (Signed)
BYRD, Sorah F. ([id number]) Visit Report for 02/17/2017 Arrival Information Details Patient Name: RIBBLE, [name] Date of Service: 02/17/2017 1:30 PM Medical Record Number: [id number] Patient Account Number: [id number] Date of Birth/Sex: Apr 08, 1925 (83 y.o. Female) Treating RN: Carolyne Fiscal, Debi Primary Care Kileen Lange: Halina Maidens Other Clinician: Referring Estalee Mccandlish: Halina Maidens Treating Margie Brink/Extender: Frann Rider in Treatment: 7 Visit Information History Since Last Visit All ordered tests and consults were completed: No Patient Arrived: Kasandra Knudsen Added or deleted any medications: No Arrival Time: 13:34 Any new allergies or adverse reactions: No Accompanied By: friend Had a fall or experienced change in No Transfer Assistance: EasyPivot activities of daily living that may affect Patient Lift risk of falls: Patient Identification Verified: Yes Signs or symptoms of abuse/neglect since last No Secondary Verification Process Yes visito Completed: Hospitalized since last visit: No Patient Requires Transmission- No Has Dressing in Place as Prescribed: Yes Based Precautions: Has Compression in Place as Prescribed: Yes Patient Has Alerts: Yes Pain Present Now: No Patient Alerts: DM II Electronic Signature(s) Signed: 02/17/2017 4:51:13 PM By: Alric Quan Entered By: Alric Quan on 02/17/2017 13:36:08 Galea, Gennette F. ([id number]) -------------------------------------------------------------------------------- Encounter Discharge Information Details [name], [name] Date of Service: 02/17/2017 1:30 PM Medical Record Number: [id number] Patient Account Number: [id number] Date of Birth/Sex: 1924/12/26 (83 y.o. Female) Treating RN: Carolyne Fiscal, Debi Primary Care Keighley Deckman: Halina Maidens Other Clinician: Referring Nalaya Wojdyla: Halina Maidens Treating Caterin Tabares/Extender: Frann Rider in Treatment: 7 Encounter Discharge Information Items Discharge Pain Level:  0 Discharge Condition: Stable Ambulatory Status: Cane Discharge Destination: Home Transportation: Private Auto Accompanied By: friend Schedule Follow-up Appointment: Yes Medication Reconciliation completed No and provided to Patient/Care Pam Vanalstine: Provided on Clinical Summary of Care: 02/17/2017 Form Type Recipient Paper Patient [facility] Electronic Signature(s) Signed: 02/21/2017 9:43:33 AM By: Ruthine Dose Entered By: Ruthine Dose on 02/17/2017 14:23:44 Kornegay, Meron F. ([id number]) -------------------------------------------------------------------------------- Lower Extremity Assessment Details [name], [name] Date of Service: 02/17/2017 1:30 PM Medical Record Number: [id number] Patient Account Number: [id number] Date of Birth/Sex: [birth year] (83 y.o. Female) Treating RN: Carolyne Fiscal, Debi Primary Care Alaijah Gibler: Halina Maidens Other Clinician: Referring Darwyn Ponzo: Halina Maidens Treating Briante Loveall/Extender: Frann Rider in Treatment: 7 Edema Assessment Assessed: Shirlyn Goltz: No] [Right: No] E[Left: dema] [Right: :] Calf Left: Right: Point of Measurement: 27 cm From Medial Instep 34.8 cm cm Ankle Left: Right: Point of Measurement: 10 cm From Medial Instep 22.3 cm cm Vascular Assessment Pulses: Dorsalis Pedis Palpable: [Left:Yes] Posterior Tibial Extremity colors, hair growth, and conditions: Extremity Color: [Left:Red] Temperature of Extremity: [Left:Warm] Capillary Refill: [Left:< 3 seconds] Toe Nail Assessment Left: Right: Thick: No Discolored: No Deformed: No Improper Length and Hygiene: No Electronic Signature(s) Signed: 02/17/2017 4:51:13 PM By: Alric Quan Entered By: Alric Quan on 02/17/2017 13:47:50 Stainback, Rollande F. ([id number]) -------------------------------------------------------------------------------- Multi Wound Chart Details [name], [name] Date of Service: 02/17/2017 1:30 PM Medical Record Number: [id number] Patient Account  Number: [id number] Date of Birth/Sex: Aug 18, 1924 (83 y.o. Female) Treating RN: Ahmed Prima Primary Care Tashauna Caisse: Halina Maidens Other Clinician: Referring Lilyann Gravelle: Halina Maidens Treating Ngoc Daughtridge/Extender: Frann Rider in Treatment: 7 Vital Signs Height(in): 63 Pulse(bpm): 79 Weight(lbs): 164.8 Blood Pressure 174/89 (mmHg): Body Mass Index(BMI): 29 Temperature(F): Respiratory Rate 16 (breaths/min): Photos: [1:No Photos] [N/A:N/A] Wound Location: [1:Left Lower Leg - Lateral] [N/A:N/A] Wounding Event: [1:Gradually Appeared] [N/A:N/A] Primary Etiology: [1:Diabetic Wound/Ulcer of the Lower Extremity] [N/A:N/A] Comorbid History: [1:Type II Diabetes] [N/A:N/A] Date Acquired: [1:12/16/2016] [N/A:N/A] Weeks of Treatment: [1:7] [N/A:N/A] Wound Status: [1:Open] [N/A:N/A] Measurements L x W x  D 4.3x3x0.1 [N/A:N/A] (cm) Area (cm) : [1:10.132] [N/A:N/A] Volume (cm) : [1:1.013] [N/A:N/A] % Reduction in Area: [1:-931.80%] [N/A:N/A] % Reduction in Volume: -933.70% [N/A:N/A] Classification: [1:Grade 1] [N/A:N/A] Exudate Amount: [1:Large] [N/A:N/A] Exudate Type: [1:Serous] [N/A:N/A] Exudate Color: [1:amber] [N/A:N/A] Wound Margin: [1:Flat and Intact] [N/A:N/A] Granulation Amount: [1:Large (67-100%)] [N/A:N/A] Necrotic Amount: [1:Small (1-33%)] [N/A:N/A] Exposed Structures: [1:Fat Layer (Subcutaneous Tissue) Exposed: Yes Fascia: No Tendon: No Muscle: No Joint: No Bone: No] [N/A:N/A] Epithelialization: [1:None] [N/A:N/A] Periwound Skin Texture: Excoriation: Yes N/A N/A Induration: No Callus: No Crepitus: No Rash: No Scarring: No Periwound Skin Maceration: Yes N/A N/A Moisture: Dry/Scaly: No Periwound Skin Color: Erythema: Yes N/A N/A Atrophie Blanche: No Cyanosis: No Ecchymosis: No Hemosiderin Staining: No Mottled: No Pallor: No Rubor: No Erythema Location: Circumferential N/A N/A Temperature: No Abnormality N/A N/A Tenderness on Yes N/A  N/A Palpation: Wound Preparation: Ulcer Cleansing: N/A N/A Rinsed/Irrigated with Saline Topical Anesthetic Applied: Other: lidocaine 4% Treatment Notes Wound #1 (Left, Lateral Lower Leg) 1. Cleansed with: Clean wound with Normal Saline 2. Anesthetic Topical Lidocaine 4% cream to wound bed prior to debridement 3. Peri-wound Care: Barrier cream 4. Dressing Applied: Other dressing (specify in notes) 5. Secondary Dressing Applied ABD Pad 7. Secured with Tape 3 Layer Compression System - Left Lower Extremity Notes unna to anchor, drawtex, PolyMem Electronic Signature(s) Marbach, Lorelee F. ([id number]) Signed: 02/17/2017 3:31:36 PM By: Christin Fudge MD, FACS Entered By: Christin Fudge on 02/17/2017 14:12:39 Packman, Rosia F. ([id number]) -------------------------------------------------------------------------------- [place] Details Patient Name: Hagins, Mialynn F. Date of Service: 02/17/2017 1:30 PM Medical Record Number: [id number] Patient Account Number: [id number] Date of Birth/Sex: 02-10-1925 (83 y.o. Female) Treating RN: Carolyne Fiscal, Debi Primary Care Derrill Bagnell: Halina Maidens Other Clinician: Referring Kaleah Hagemeister: Halina Maidens Treating Eldean Klatt/Extender: Frann Rider in Treatment: 7 Active Inactive ` Abuse / Safety / Falls / Self Care Management Nursing Diagnoses: Potential for falls Goals: Patient will not experience any injury related to falls Date Initiated: 12/30/2016 Target Resolution Date: 04/29/2017 Goal Status: Active Interventions: Assess Activities of Daily Living upon admission and as needed Assess: immobility, friction, shearing, incontinence upon admission and as needed Notes: ` Nutrition Nursing Diagnoses: Imbalanced nutrition Impaired glucose control: actual or potential Potential for alteratiion in Nutrition/Potential for imbalanced nutrition Goals: Patient/caregiver will maintain therapeutic glucose control Date Initiated:  12/30/2016 Target Resolution Date: 04/29/2017 Goal Status: Active Interventions: Assess patient nutrition upon admission and as needed per policy Notes: ` Orientation to the Archuleta, [name] ([id number]) Nursing Diagnoses: Knowledge deficit related to the wound healing center program Goals: Patient/caregiver will verbalize understanding of the [place] Date Initiated: 12/30/2016 Target Resolution Date: 01/21/2017 Goal Status: Active Interventions: Provide education on orientation to the wound center Notes: ` Pain, Acute or Chronic Nursing Diagnoses: Pain, acute or chronic: actual or potential Potential alteration in comfort, pain Goals: Patient/caregiver will verbalize adequate pain control between visits Date Initiated: 12/30/2016 Target Resolution Date: 04/29/2017 Goal Status: Active Interventions: Complete pain assessment as per visit requirements Notes: ` Wound/Skin Impairment Nursing Diagnoses: Impaired tissue integrity Knowledge deficit related to smoking impact on wound healing Knowledge deficit related to ulceration/compromised skin integrity Goals: Ulcer/skin breakdown will have a volume reduction of 80% by week 12 Date Initiated: 12/30/2016 Target Resolution Date: 04/22/2017 Goal Status: Active Interventions: Assess patient/caregiver ability to perform ulcer/skin care regimen upon admission and as needed Notes: EVOLA, HOLLIS ([id number]) Electronic Signature(s) Signed: 02/17/2017 4:51:13 PM By: Alric Quan Entered By: Alric Quan on 02/17/2017 13:47:55 Whitner, Shalandra F. (  [id number]) -------------------------------------------------------------------------------- Pain Assessment Details Patient Name: MORELL, Hennesy F. Date of Service: 02/17/2017 1:30 PM Medical Record Number: [id number] Patient Account Number: [id number] Date of Birth/Sex: April 29, 1925 (83 y.o. Female) Treating RN: Carolyne Fiscal, Debi Primary Care Izabell Schalk: Halina Maidens Other Clinician: Referring Tula Schryver: Halina Maidens Treating Jamir Rone/Extender: Frann Rider in Treatment: 7 Active Problems Location of Pain Severity and Description of Pain Patient Has Paino No Site Locations Pain Management and Medication Current Pain Management: Electronic Signature(s) Signed: 02/17/2017 4:51:13 PM By: Alric Quan Entered By: Alric Quan on 02/17/2017 13:36:15 Filter, Zaley F. ([id number]) -------------------------------------------------------------------------------- Patient/Caregiver Education Details Patient Name: [name], Hedda F. Date of Service: 02/17/2017 1:30 PM Medical Record Number: [id number] Patient Account Number: [id number] Date of Birth/Gender: [birth year] (83 y.o. Female) Treating RN: Ahmed Prima Primary Care Physician: Halina Maidens Other Clinician: Referring Physician: Halina Maidens Treating Physician/Extender: Frann Rider in Treatment: 7 Education Assessment Education Provided To: Patient Education Topics Provided Wound/Skin Impairment: Handouts: Other: change dressing as ordered Methods: Demonstration, Explain/Verbal Responses: State content correctly Electronic Signature(s) Signed: 02/17/2017 4:51:13 PM By: Alric Quan Entered By: Alric Quan on 02/17/2017 13:54:11 Candelaria, Creasie F. ([id number]) -------------------------------------------------------------------------------- Wound Assessment Details Patient Name: Deterding, Cory F. Date of Service: 02/17/2017 1:30 PM Medical Record Number: [id number] Patient Account Number: [id number] Date of Birth/Sex: [birth year] (83 y.o. Female) Treating RN: Carolyne Fiscal, Debi Primary Care Atalie Oros: Halina Maidens Other Clinician: Referring Dejanay Wamboldt: Halina Maidens Treating Jmya Uliano/Extender: Frann Rider in Treatment: 7 Wound Status Wound Number: 1 Primary Diabetic Wound/Ulcer of the Lower Etiology: Extremity Wound Location: Left Lower Leg - Lateral Wound  Status: Open Wounding Event: Gradually Appeared Comorbid Type II Diabetes Date Acquired: 12/16/2016 History: Weeks Of Treatment: 7 Clustered Wound: No Photos Photo Uploaded By: Alric Quan on 02/17/2017 16:22:53 Wound Measurements Length: (cm) 4.3 Width: (cm) 3 Depth: (cm) 0.1 Area: (cm) 10.132 Volume: (cm) 1.013 % Reduction in Area: -931.8% % Reduction in Volume: -933.7% Epithelialization: None Tunneling: No Undermining: No Wound Description Classification: Grade 1 Wound Margin: Flat and Intact Exudate Amount: Large Exudate Type: Serous Exudate Color: amber Foul Odor After Cleansing: No Slough/Fibrino Yes Wound Bed Granulation Amount: Large (67-100%) Exposed Structure Necrotic Amount: Small (1-33%) Fascia Exposed: No Necrotic Quality: Adherent Slough Fat Layer (Subcutaneous Tissue) Exposed: Yes Tendon Exposed: No Brunell, Chelsey F. ([id number]) Muscle Exposed: No Joint Exposed: No Bone Exposed: No Periwound Skin Texture Texture Color No Abnormalities Noted: No No Abnormalities Noted: No Callus: No Atrophie Blanche: No Crepitus: No Cyanosis: No Excoriation: Yes Ecchymosis: No Induration: No Erythema: Yes Rash: No Erythema Location: Circumferential Scarring: No Hemosiderin Staining: No Mottled: No Moisture Pallor: No No Abnormalities Noted: No Rubor: No Dry / Scaly: No Maceration: Yes Temperature / Pain Temperature: No Abnormality Tenderness on Palpation: Yes Wound Preparation Ulcer Cleansing: Rinsed/Irrigated with Saline Topical Anesthetic Applied: Other: lidocaine 4%, Treatment Notes Wound #1 (Left, Lateral Lower Leg) 1. Cleansed with: Clean wound with Normal Saline 2. Anesthetic Topical Lidocaine 4% cream to wound bed prior to debridement 3. Peri-wound Care: Barrier cream 4. Dressing Applied: Other dressing (specify in notes) 5. Secondary Dressing Applied ABD Pad 7. Secured with Tape 3 Layer Compression System - Left Lower  Extremity Notes unna to anchor, drawtex, PolyMem Electronic Signature(s) Signed: 02/17/2017 4:51:13 PM By: Alric Quan Entered By: Alric Quan on 02/17/2017 13:46:52 Berns, Wilhemina F. ([id number]) Bohr, Latiana F. ([id number]) -------------------------------------------------------------------------------- Vitals Details Patient Name: Garmany, Mylissa F. Date of Service: 02/17/2017 1:30 PM Medical Record Number: [id number] Patient Account Number: [id number] Date of Birth/Sex: Mar 01, 1925 (83 y.o. Female) Treating  RN: Ahmed Prima Primary Care Mayah Urquidi: Halina Maidens Other Clinician: Referring Yeni Jiggetts: Halina Maidens Treating Giannamarie Paulus/Extender: Frann Rider in Treatment: 7 Vital Signs Time Taken: 13:36 Pulse (bpm): 79 Height (in): 63 Respiratory Rate (breaths/min): 16 Weight (lbs): 164.8 Blood Pressure (mmHg): 174/89 Body Mass Index (BMI): 29.2 Reference Range: 80 - 120 mg / dl Notes Made Dr. Con Memos aware of pts BP. Electronic Signature(s) Signed: 02/17/2017 4:51:13 PM By: Alric Quan Entered By: Alric Quan on 02/17/2017 13:38:19

## 2017-02-24 ENCOUNTER — Encounter: Payer: Medicare PPO | Attending: Surgery | Admitting: Surgery

## 2017-02-24 DIAGNOSIS — Z880 Allergy status to penicillin: Secondary | ICD-10-CM | POA: Diagnosis not present

## 2017-02-24 DIAGNOSIS — L03116 Cellulitis of left lower limb: Secondary | ICD-10-CM | POA: Diagnosis not present

## 2017-02-24 DIAGNOSIS — Z7984 Long term (current) use of oral hypoglycemic drugs: Secondary | ICD-10-CM | POA: Diagnosis not present

## 2017-02-24 DIAGNOSIS — Z888 Allergy status to other drugs, medicaments and biological substances status: Secondary | ICD-10-CM | POA: Diagnosis not present

## 2017-02-24 DIAGNOSIS — Z89412 Acquired absence of left great toe: Secondary | ICD-10-CM | POA: Insufficient documentation

## 2017-02-24 DIAGNOSIS — E11622 Type 2 diabetes mellitus with other skin ulcer: Secondary | ICD-10-CM | POA: Diagnosis not present

## 2017-02-24 DIAGNOSIS — L97911 Non-pressure chronic ulcer of unspecified part of right lower leg limited to breakdown of skin: Secondary | ICD-10-CM | POA: Insufficient documentation

## 2017-02-24 DIAGNOSIS — Z87891 Personal history of nicotine dependence: Secondary | ICD-10-CM | POA: Insufficient documentation

## 2017-02-24 DIAGNOSIS — I890 Lymphedema, not elsewhere classified: Secondary | ICD-10-CM | POA: Diagnosis not present

## 2017-02-24 DIAGNOSIS — I10 Essential (primary) hypertension: Secondary | ICD-10-CM | POA: Diagnosis not present

## 2017-02-26 ENCOUNTER — Other Ambulatory Visit: Payer: Self-pay | Admitting: Internal Medicine

## 2017-02-26 NOTE — Progress Notes (Signed)
[name], [name] ([id number]) Visit Report for 02/24/2017 Chief Complaint Document Details Patient Name: Laura [name], [name] Date of Service: 02/24/2017 1:30 PM Medical Record Number: [id number] Patient Account Number: [id number] Date of Birth/Sex: 10/08/1924 (83 y.o. Female) Treating RN: Ahmed Prima Primary Care Provider: Halina Maidens Other Clinician: Referring Provider: Halina Maidens Treating Provider/Extender: Frann Rider in Treatment: 8 Information Obtained from: Patient Chief Complaint Left lower leg cellulitis Electronic Signature(s) Signed: 02/24/2017 3:48:14 PM By: Christin Fudge MD, FACS Entered By: Christin Fudge on 02/24/2017 14:35:56 Boeder, Jun F. ([id number]) -------------------------------------------------------------------------------- HPI Details [name], [name] Date of Service: 02/24/2017 1:30 PM Medical Record Number: [id number] Patient Account Number: [id number] Date of Birth/Sex: 07-11-1924 (83 y.o. Female) Treating RN: Carolyne Fiscal, Debi Primary Care Provider: Halina Maidens Other Clinician: Referring Provider: Halina Maidens Treating Provider/Extender: Frann Rider in Treatment: 8 History of Present Illness HPI Description: 12/30/16 Patient presents today for initial evaluation concerning an area over her left lateral ankle which she tells me has been intermittent in nature since 2000. However the current opening has been present for about two weeks. She has been tolerating the dressing changes at home with antibiotic ointment but that is really the only treatment that has been initiated at this point. That is other than the oral antibiotics which was Keflex that patient was placed on by her primary care provider prior to being referred to [place]. She does have some discomfort but rates this to be around a 2-3 out of 10 fortunately the redness does not seem to be spreading to any other location as far as her lower extremity is concerned. She does tell  me that in the past antibiotics have been beneficial for her unfortunately the current antibiotics have not been of benefit and no wound culture was performed as of yet. She has previously seen Dr. 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 She does have diabetes and are most recent blood sugars have run between 104 and 106 according to patient's although her last hemoglobin A1c she is unaware of. Patient's current wound does not appear to be significantly open but is rather more of a weeping region of cellulitis. 01/27/17 on evaluation today patient left lower for many wound continues to show signs of erythema surrounding and in fact she is noted to have erythema from her toes up to just below the knee in regard to left lower extremity. She is not having any fevers, chills, nausea, vomiting, or diarrhea at this point. With that being said she is concerned about the silver alginate dressing. She tells me that in the past she used silver and some of the dressings for previous one and did not do well with it. Nonetheless she has discomfort along the wound itself but no other pain noted throughout the left lower extremity. 02/03/17 on evaluation today patient appears to be doing fairly well overall other than the fact that her wound is worsening on the lateral aspect of her left lower extremity. It appears more macerated and even slough covered at this point. With that being said she has been attempting to perform the dressing changes on her own although I'm not sure that she is understanding exactly how this should be done. Nonetheless I do believe the gentamicin may be causing too much maceration she really has not wanted to utilize  the silver alginate dressings past due to a previous issue with this. Overall her erythema of  the left lower extremity is improved she still has significant swelling. No fevers, chills, nausea, or vomiting noted at this time. 02/10/2017 -- Old notes received -- was seen in 2009 by [facility] by Dr. Romie Levee -- his impression was chronic patches of dermatitis on the left lower extremity and current mild swelling. He recommended venous insufficiency venous reflux studies. She had no evidence of arterial insufficiency. Follow-up chronic venous insufficiency study done on 12/16/2007, showed very mild amount of reflux on the great saphenous vein at the knee but the function of the vein is normal and there was no evidence by the venous reflux study.. They referred her to dermatology for a second opinion and would see her back in about 4-6 weeks. She was seen back in follow-up in January 01 2008 and at that time she was asked to use a steroid ointment locally and the wounds are almost completely healed. [name], [name] ([id number]) The patient has told me today that she does not tolerate silver dressing -- and she has not had home health come and change her dressings over the last week. 02/17/2017 -- - had a lower arterial study done which showed a bilateral ABI suggests no significant lower extremity arterial disease and the toe brachial indicis were normal on the right and abnormal on the left. The right ABI was 1.0 and the left was 1.07. The right toe brachial indices was 0.81 on the right and 0.56 on the left. She had biphasic and triphasic flow at the tibial vessels. 02/24/2017 -- the patient has been tolerating a 3 layer wrap and seems to be pleased with her progress Electronic Signature(s) Signed: 02/24/2017 3:48:14 PM By: Christin Fudge MD, FACS Entered By: Christin Fudge on 02/24/2017 14:36:17 Alkema, [name] ([id number]) -------------------------------------------------------------------------------- Physical Exam Details [name], [name] Date of Service: 02/24/2017 1:30  PM Medical Record Number: [id number] Patient Account Number: [id number] Date of Birth/Sex: [birth year] (83 y.o. Female) Treating RN: Ahmed Prima Primary Care Provider: Halina Maidens Other Clinician: Referring Provider: Halina Maidens Treating Provider/Extender: Frann Rider in Treatment: 8 Constitutional . Pulse regular. Respirations normal and unlabored. Afebrile. . Eyes Nonicteric. Reactive to light. Ears, Nose, Mouth, and Throat Lips, teeth, and gums WNL.Marland Kitchen Moist mucosa without lesions. Neck supple and nontender. No palpable supraclavicular or cervical adenopathy. Normal sized without goiter. Respiratory WNL. No retractions.. Cardiovascular Pedal Pulses WNL. No clubbing, cyanosis or edema. Lymphatic No adneopathy. No adenopathy. No adenopathy. Musculoskeletal Adexa without tenderness or enlargement.. Digits and nails w/o clubbing, cyanosis, infection, petechiae, ischemia, or inflammatory conditions.. Integumentary (Hair, Skin) No suspicious lesions. No crepitus or fluctuance. No peri-wound warmth or erythema. No masses.Marland Kitchen Psychiatric Judgement and insight Intact.. No evidence of depression, anxiety, or agitation.. Notes the lymphedema is looking much better and the ulceration and weeping has improved. There has been significant progress for the better Electronic Signature(s) Signed: 02/24/2017 3:48:14 PM By: Christin Fudge MD, FACS Entered By: Christin Fudge on 02/24/2017 14:36:49 Pascuzzi, Ananiah F. ([id number]) -------------------------------------------------------------------------------- Physician Orders Details Patient Name: Langenberg, Kesa F. Date of Service: 02/24/2017 1:30 PM Medical Record Number: [id number] Patient Account Number: [id number] Date of Birth/Sex: 27-Jun-1924 (83 y.o. Female) Treating RN: Montey Hora Primary Care Provider: Halina Maidens Other Clinician: Referring Provider: Halina Maidens Treating Provider/Extender: Frann Rider in Treatment:  8 Verbal / Phone Orders: No Diagnosis Coding Wound Cleansing Wound #1 Left,Lateral Lower Leg o Clean wound  with wound cleanser. o Cleanse wound with mild soap and water Anesthetic Wound #1 Left,Lateral Lower Leg o Topical Lidocaine 4% cream applied to wound bed prior to debridement Skin Barriers/Peri-Wound Care Wound #1 Left,Lateral Lower Leg o Barrier cream Primary Wound Dressing Wound #1 Left,Lateral Lower Leg o Other: - PolyMem (NO SILVER) Secondary Dressing Wound #1 Left,Lateral Lower Leg o ABD pad o Drawtex Dressing Change Frequency o Change Dressing Monday, Wednesday, Friday - HHRN to change dressing Monday and Wednesday and pt will come to the wound care center on Friday. Follow-up Appointments Wound #1 Left,Lateral Lower Leg o Return Appointment in 1 week. Edema Control Wound #1 Left,Lateral Lower Leg o 3 Layer Compression System - Left Lower Extremity - Please wrap 3cm from toes and 3cm from knee. unna to anchor o Elevate legs to the level of the heart and pump ankles as often as possible Stann, Nori F. ([id number]) Additional Orders / Instructions Wound #1 Left,Lateral Lower Leg o Increase protein intake. Home Health Wound #1 Left,Lateral Lower Leg o Continue Home Health Visits - [facility] to change dressing Monday and Wednesday. Please order these supplies for the pt. o Home Health Nurse may visit PRN to address patientos wound care needs. o FACE TO FACE ENCOUNTER: MEDICARE and MEDICAID PATIENTS: I certify that this patient is under my care and that I had a face-to-face encounter that meets the physician face-to-face encounter requirements with this patient on this date. The encounter with the patient was in whole or in part for the following MEDICAL CONDITION: (primary reason for [place]) MEDICAL NECESSITY: I certify, that based on my findings, NURSING services are a medically necessary home health service. HOME BOUND STATUS: I  certify that my clinical findings support that this patient is homebound (i.e., Due to illness or injury, pt requires aid of supportive devices such as crutches, cane, wheelchairs, walkers, the use of special transportation or the assistance of another person to leave their place of residence. There is a normal inability to leave the home and doing so requires considerable and taxing effort. Other absences are for medical reasons / religious services and are infrequent or of short duration when for other reasons). o If current dressing causes regression in wound condition, may D/C ordered dressing product/s and apply Normal Saline Moist Dressing daily until next [place] / Other MD appointment. [place] of regression in wound condition at [phone]. o Please direct any NON-WOUND related issues/requests for orders to patient's Primary Care Physician Medications-please add to medication list. Wound #1 Left,Lateral Lower Leg o Other: - Vitamin A, Vitamin C, Zinc, MVI Electronic Signature(s) Signed: 02/24/2017 3:48:14 PM By: Christin Fudge MD, FACS Signed: 02/24/2017 5:02:56 PM By: Montey Hora Entered By: Montey Hora on 02/24/2017 13:58:19 Ravi, Estreya F. ([id number]) -------------------------------------------------------------------------------- Problem List Details [name], [name] Date of Service: 02/24/2017 1:30 PM Medical Record Number: [id number] Patient Account Number: [id number] Date of Birth/Sex: March 19, 1925 (83 y.o. Female) Treating RN: Carolyne Fiscal, Debi Primary Care Provider: Halina Maidens Other Clinician: Referring Provider: Halina Maidens Treating Provider/Extender: Frann Rider in Treatment: 8 Active Problems ICD-10 Encounter Code Description Active Date Diagnosis E11.622 Type 2 diabetes mellitus with other skin ulcer 01/02/2017 Yes L03.116 Cellulitis of left lower limb 01/02/2017 Yes I10 Essential (primary)  hypertension 01/02/2017 Yes I89.0 Lymphedema, not elsewhere classified 01/16/2017 Yes Inactive Problems Resolved Problems Electronic Signature(s) Signed: 02/24/2017 3:48:14 PM By: Christin Fudge MD, FACS Entered By: Christin Fudge on 02/24/2017 14:35:40 Shuart, Misheel F. ([id number]) -------------------------------------------------------------------------------- Progress  Note Details [name], [name] Date of Service: 02/24/2017 1:30 PM Medical Record Number: [id number] Patient Account Number: [id number] Date of Birth/Sex: 08/10/1924 (83 y.o. Female) Treating RN: Carolyne Fiscal, Debi Primary Care Provider: Halina Maidens Other Clinician: Referring Provider: Halina Maidens Treating Provider/Extender: Frann Rider in Treatment: 8 Subjective Chief Complaint Information obtained from Patient Left lower leg cellulitis History of Present Illness (HPI) 12/30/16 Patient presents today for initial evaluation concerning an area over her left lateral ankle which she tells me has been intermittent in nature since 2000. However the current opening has been present for about two weeks. She has been tolerating the dressing changes at home with antibiotic ointment but that is really the only treatment that has been initiated at this point. That is other than the oral antibiotics which was Keflex that patient was placed on by her primary care provider prior to being referred to [place]. She does have some discomfort but rates this to be around a 2-3 out of 10 fortunately the redness does not seem to be spreading to any other location as far as her lower extremity is concerned. She does tell me that in the past antibiotics have been beneficial for her unfortunately the current antibiotics have not been of benefit and no wound culture was performed as of yet. She has previously seen Dr. Ola Spurr her infectious disease doctor back in 2015 when she had osteomyelitis of the left great toe but subsequently Dr.  Vickki Muff had to amputate she did not and up responding well to treatment otherwise at that point. She does have diabetes and are most recent blood sugars have run between 104 and 106 according to patient's although her last hemoglobin A1c she is unaware of. Patient's current wound does not appear to be significantly open but is rather more of a weeping region of cellulitis. 01/27/17 on evaluation today patient left lower for many wound continues to show signs of erythema surrounding and in fact she is noted to have erythema from her toes up to just below the knee in regard to left lower extremity. She is not having any fevers, chills, nausea, vomiting, or diarrhea at this point. With that being said she is concerned about the silver alginate dressing. She tells me that in the past she used silver and some of the dressings for previous one and did not do well with it. Nonetheless she has discomfort along the wound itself but no other pain noted throughout the left lower extremity. 02/03/17 on evaluation today patient appears to be doing fairly well overall other than the fact that her wound is worsening on the lateral aspect of her left lower extremity. It appears more macerated and even slough covered at this point. With that being said she has been attempting to perform the dressing changes on her own although I'm not sure that she is understanding exactly how this should be done. Nonetheless I do believe the gentamicin may be causing too much maceration she really has not wanted to utilize the silver alginate dressings past due to a previous issue with this. Overall her erythema of the left lower extremity is improved she still has significant swelling. No fevers, chills, nausea, or vomiting noted at this time. 02/10/2017 -- Old notes received -- was seen in 2009 by [facility] by Dr. Romie Levee -- his impression was chronic patches of dermatitis on the left lower extremity and current mild  swelling. He recommended venous insufficiency venous reflux studies. She had no evidence  of arterial insufficiency. Ostermann, Nigel F. ([id number]) Follow-up chronic venous insufficiency study done on 12/16/2007, showed very mild amount of reflux on the great saphenous vein at the knee but the function of the vein is normal and there was no evidence by the venous reflux study.. They referred her to dermatology for a second opinion and would see her back in about 4-6 weeks. She was seen back in follow-up in January 01 2008 and at that time she was asked to use a steroid ointment locally and the wounds are almost completely healed. The patient has told me today that she does not tolerate silver dressing -- and she has not had home health come and change her dressings over the last week. 02/17/2017 -- - had a lower arterial study done which showed a bilateral ABI suggests no significant lower extremity arterial disease and the toe brachial indicis were normal on the right and abnormal on the left. The right ABI was 1.0 and the left was 1.07. The right toe brachial indices was 0.81 on the right and 0.56 on the left. She had biphasic and triphasic flow at the tibial vessels. 02/24/2017 -- the patient has been tolerating a 3 layer wrap and seems to be pleased with her progress Objective Constitutional Pulse regular. Respirations normal and unlabored. Afebrile. Vitals Time Taken: 1:39 PM, Height: 63 in, Weight: 164.8 lbs, BMI: 29.2, Temperature: 98.3 F, Pulse: 67 bpm, Respiratory Rate: 16 breaths/min, Blood Pressure: 136/69 mmHg. Eyes Nonicteric. Reactive to light. Ears, Nose, Mouth, and Throat Lips, teeth, and gums WNL.Marland Kitchen Moist mucosa without lesions. Neck supple and nontender. No palpable supraclavicular or cervical adenopathy. Normal sized without goiter. Respiratory WNL. No retractions.. Cardiovascular Pedal Pulses WNL. No clubbing, cyanosis or edema. Lymphatic No adneopathy. No adenopathy. No  adenopathy. Emmerich, Bernyce F. ([id number]) Musculoskeletal Adexa without tenderness or enlargement.. Digits and nails w/o clubbing, cyanosis, infection, petechiae, ischemia, or inflammatory conditions.Marland Kitchen Psychiatric Judgement and insight Intact.. No evidence of depression, anxiety, or agitation.. General Notes: the lymphedema is looking much better and the ulceration and weeping has improved. There has been significant progress for the better Integumentary (Hair, Skin) No suspicious lesions. No crepitus or fluctuance. No peri-wound warmth or erythema. No masses.. Wound #1 status is Open. Original cause of wound was Gradually Appeared. The wound is located on the Left,Lateral Lower Leg. The wound measures 3.8cm length x 2.7cm width x 0.1cm depth; 8.058cm^2 area and 0.806cm^3 volume. There is Fat Layer (Subcutaneous Tissue) Exposed exposed. There is no tunneling or undermining noted. There is a large amount of serous drainage noted. The wound margin is flat and intact. There is large (67-100%) granulation within the wound bed. There is a small (1-33%) amount of necrotic tissue within the wound bed including Adherent Slough. The periwound skin appearance exhibited: Excoriation, Maceration, Erythema. The periwound skin appearance did not exhibit: Callus, Crepitus, Induration, Rash, Scarring, Dry/Scaly, Atrophie Blanche, Cyanosis, Ecchymosis, Hemosiderin Staining, Mottled, Pallor, Rubor. The surrounding wound skin color is noted with erythema which is circumferential. Periwound temperature was noted as No Abnormality. The periwound has tenderness on palpation. Assessment Active Problems ICD-10 E11.622 - Type 2 diabetes mellitus with other skin ulcer L03.116 - Cellulitis of left lower limb I10 - Essential (primary) hypertension I89.0 - Lymphedema, not elsewhere classified Plan Wound Cleansing: Wound #1 Left,Lateral Lower Leg: Clean wound with wound cleanser. Cleanse wound with mild soap and  water Mozer, Aleana F. ([id number]) Anesthetic: Wound #1 Left,Lateral Lower Leg: Topical Lidocaine 4% cream applied to wound bed prior to  debridement Skin Barriers/Peri-Wound Care: Wound #1 Left,Lateral Lower Leg: Barrier cream Primary Wound Dressing: Wound #1 Left,Lateral Lower Leg: Other: - PolyMem (NO SILVER) Secondary Dressing: Wound #1 Left,Lateral Lower Leg: ABD pad Drawtex Dressing Change Frequency: Change Dressing Monday, Wednesday, Friday - HHRN to change dressing Monday and Wednesday and pt will come to the wound care center on Friday. Follow-up Appointments: Wound #1 Left,Lateral Lower Leg: Return Appointment in 1 week. Edema Control: Wound #1 Left,Lateral Lower Leg: 3 Layer Compression System - Left Lower Extremity - Please wrap 3cm from toes and 3cm from knee. unna to anchor Elevate legs to the level of the heart and pump ankles as often as possible Additional Orders / Instructions: Wound #1 Left,Lateral Lower Leg: Increase protein intake. Home Health: Wound #1 Left,Lateral Lower Leg: Continue Home Health Visits - HHRN to change dressing Monday and Wednesday. Please order these supplies for the pt. Home Health Nurse may visit PRN to address patient s wound care needs. FACE TO FACE ENCOUNTER: MEDICARE and MEDICAID PATIENTS: I certify that this patient is under my care and that I had a face-to-face encounter that meets the physician face-to-face encounter requirements with this patient on this date. The encounter with the patient was in whole or in part for the following MEDICAL CONDITION: (primary reason for [place]) MEDICAL NECESSITY: I certify, that based on my findings, NURSING services are a medically necessary home health service. HOME BOUND STATUS: I certify that my clinical findings support that this patient is homebound (i.e., Due to illness or injury, pt requires aid of supportive devices such as crutches, cane, wheelchairs, walkers, the use of special  transportation or the assistance of another person to leave their place of residence. There is a normal inability to leave the home and doing so requires considerable and taxing effort. Other absences are for medical reasons / religious services and are infrequent or of short duration when for other reasons). If current dressing causes regression in wound condition, may D/C ordered dressing product/s and apply Normal Saline Moist Dressing daily until next [place] / Other MD appointment. [place] of regression in wound condition at [phone]. Please direct any NON-WOUND related issues/requests for orders to patient's Primary Care Physician Medications-please add to medication list.: Wound #1 Left,Lateral Lower Leg: Other: - Vitamin A, Vitamin C, Zinc, MVI Abdulla, Etrulia F. ([id number]) I I'm pleased with her progress and have reviewed details of this with her and recommended: 1. PolyMem foam, drawtext and a 3 layer Profore wrap to be changed 3 times a week. 2. if PolyMem is not available the home health can use calcium alginate 3. Elevation and exercise has been discussed with her 4. She will return to see [place] next week for a review. I have discussed at length with her that once her wounds have healed he will try and get her into compression stockings or compression wraps for a long-term basis. If her wounds however don't respond well she may need a venous duplex reflux study at a later date. Electronic Signature(s) Signed: 02/24/2017 3:48:14 PM By: Christin Fudge MD, FACS Entered By: Christin Fudge on 02/24/2017 14:38:11 Mcbain, Jan F. ([id number]) -------------------------------------------------------------------------------- SuperBill Details [name], [name] Date of Service: 02/24/2017 Medical Record Number: [id number] Patient Account Number: [id number] Date of Birth/Sex: [birth year] (83 y.o. Female) Treating RN: Ahmed Prima Primary Care Provider:  Halina Maidens Other Clinician: Referring Provider: Halina Maidens Treating Provider/Extender: Frann Rider in Treatment: 8 Diagnosis Coding ICD-10 Codes Code Description  E11.622 Type 2 diabetes mellitus with other skin ulcer L03.116 Cellulitis of left lower limb I10 Essential (primary) hypertension I89.0 Lymphedema, not elsewhere classified Facility Procedures CPT4: Description Modifier Quantity Code [id number] (Facility Use Only) [phone] - APPLY [place] [facility] 1 LEG Physician Procedures CPT4 Code: [id number] Description: 75883 - WC PHYS LEVEL 3 - EST PT ICD-10 Description Diagnosis E11.622 Type 2 diabetes mellitus with other skin ulcer L03.116 Cellulitis of left lower limb I10 Essential (primary) hypertension I89.0 Lymphedema, not elsewhere classified Modifier: Quantity: 1 Electronic Signature(s) Signed: 02/24/2017 3:48:14 PM By: Christin Fudge MD, FACS Signed: 02/24/2017 4:50:20 PM By: Alric Quan Entered By: Alric Quan on 02/24/2017 15:17:25

## 2017-02-27 NOTE — Progress Notes (Signed)
Laura [name], [name] ([id number]) Visit Report for 02/24/2017 Arrival Information Details Patient Name: Laura [name], [name] Date of Service: 02/24/2017 1:30 PM Medical Record Number: [id number] Patient Account Number: [id number] Date of Birth/Sex: 07-09-1924 (83 y.o. Female) Treating RN: Carolyne Fiscal, Debi Primary Care Ketzia Guzek: Halina Maidens Other Clinician: Referring Rovena Hearld: Halina Maidens Treating Nazaret Chea/Extender: Frann Rider in Treatment: 8 Visit Information History Since Last Visit Added or deleted any medications: No Patient Arrived: Laura [name] Any new allergies or adverse reactions: No Arrival Time: 13:35 Had a fall or experienced change in No Accompanied By: friend activities of daily living that may affect Transfer Assistance: None risk of falls: Patient Identification Verified: Yes Signs or symptoms of abuse/neglect since last No Secondary Verification Process Completed: Yes visito Patient Requires Transmission-Based No Hospitalized since last visit: No Precautions: Has Dressing in Place as Prescribed: Yes Patient Has Alerts: Yes Has Compression in Place as Prescribed: Yes Patient Alerts: DM II Pain Present Now: No Electronic Signature(s) Signed: 02/24/2017 4:50:20 PM By: Alric Quan Entered By: Alric Quan on 02/24/2017 13:38:39 Laura [name], [name] ([id number]) -------------------------------------------------------------------------------- Encounter Discharge Information Details Patient Name: Laura [name], [name] Date of Service: 02/24/2017 1:30 PM Medical Record Number: [id number] Patient Account Number: [id number] Date of Birth/Sex: December 01, 1924 (83 y.o. Female) Treating RN: Montey Hora Primary Care Mckaela Howley: Halina Maidens Other Clinician: Referring Myosha Cuadras: Halina Maidens Treating Relda Agosto/Extender: Frann Rider in Treatment: 8 Encounter Discharge Information Items Discharge Pain Level: 0 Discharge Condition: Stable Ambulatory Status: Cane Discharge Destination:  Home Transportation: Private Auto Accompanied By: friend Schedule Follow-up Appointment: Yes Medication Reconciliation completed No and provided to Patient/Care Aliani Caccavale: Provided on Clinical Summary of Care: 02/24/2017 Form Type Recipient Paper Patient [facility] Electronic Signature(s) Signed: 02/27/2017 10:12:15 AM By: Ruthine Dose Entered By: Ruthine Dose on 02/24/2017 14:19:10 Laura [name], [name] ([id number]) -------------------------------------------------------------------------------- Lower Extremity Assessment Details Patient Name: [name], [name] Date of Service: 02/24/2017 1:30 PM Medical Record Number: [id number] Patient Account Number: [id number] Date of Birth/Sex: Mar 04, 1925 (83 y.o. Female) Treating RN: Montey Hora Primary Care Natalio Salois: Halina Maidens Other Clinician: Referring Shelda Truby: Halina Maidens Treating Syrina Wake/Extender: Frann Rider in Treatment: 8 Edema Assessment Assessed: Shirlyn Goltz: No] [Right: No] E[Left: dema] [Right: :] Calf Left: Right: Point of Measurement: 27 cm From Medial Instep 31.2 cm cm Ankle Left: Right: Point of Measurement: 10 cm From Medial Instep 22.4 cm cm Vascular Assessment Pulses: Dorsalis Pedis Palpable: [Left:Yes] Posterior Tibial Extremity colors, hair growth, and conditions: Extremity Color: [Left:Hyperpigmented] Hair Growth on Extremity: [Left:No] Temperature of Extremity: [Left:Warm] Capillary Refill: [Left:< 3 seconds] Electronic Signature(s) Signed: 02/24/2017 5:02:56 PM By: Montey Hora Entered By: Montey Hora on 02/24/2017 13:51:11 Laura [name], [name] ([id number]) -------------------------------------------------------------------------------- Multi Wound Chart Details Patient Name: [name], [name] Date of Service: 02/24/2017 1:30 PM Medical Record Number: [id number] Patient Account Number: [id number] Date of Birth/Sex: Aug 07, 1924 (83 y.o. Female) Treating RN: Montey Hora Primary Care Dorothye Berni: Halina Maidens Other  Clinician: Referring Lunabella Badgett: Halina Maidens Treating Henson Fraticelli/Extender: Frann Rider in Treatment: 8 Vital Signs Height(in): 63 Pulse(bpm): 67 Weight(lbs): 164.8 Blood Pressure 136/69 (mmHg): Body Mass Index(BMI): 29 Temperature(F): 98.3 Respiratory Rate 16 (breaths/min): Photos: [1:No Photos] [N/A:N/A] Wound Location: [1:Left Lower Leg - Lateral] [N/A:N/A] Wounding Event: [1:Gradually Appeared] [N/A:N/A] Primary Etiology: [1:Diabetic Wound/Ulcer of the Lower Extremity] [N/A:N/A] Comorbid History: [1:Type II Diabetes] [N/A:N/A] Date Acquired: [1:12/16/2016] [N/A:N/A] Weeks of Treatment: [1:8] [N/A:N/A] Wound Status: [1:Open] [N/A:N/A] Measurements L x W x D 3.8x2.7x0.1 [N/A:N/A] (cm) Area (cm) : [1:8.058] [N/A:N/A] Volume (cm) : [1:0.806] [N/A:N/A] % Reduction in Area: [1:-720.60%] [N/A:N/A] %  Reduction in Volume: -722.40% [N/A:N/A] Classification: [1:Grade 1] [N/A:N/A] Exudate Amount: [1:Large] [N/A:N/A] Exudate Type: [1:Serous] [N/A:N/A] Exudate Color: [1:amber] [N/A:N/A] Wound Margin: [1:Flat and Intact] [N/A:N/A] Granulation Amount: [1:Large (67-100%)] [N/A:N/A] Necrotic Amount: [1:Small (1-33%)] [N/A:N/A] Exposed Structures: [1:Fat Layer (Subcutaneous Tissue) Exposed: Yes Fascia: No Tendon: No Muscle: No Joint: No Bone: No] [N/A:N/A] Epithelialization: [1:Small (1-33%)] [N/A:N/A] Periwound Skin Texture: Excoriation: Yes N/A N/A Induration: No Callus: No Crepitus: No Rash: No Scarring: No Periwound Skin Maceration: Yes N/A N/A Moisture: Dry/Scaly: No Periwound Skin Color: Erythema: Yes N/A N/A Atrophie Blanche: No Cyanosis: No Ecchymosis: No Hemosiderin Staining: No Mottled: No Pallor: No Rubor: No Erythema Location: Circumferential N/A N/A Temperature: No Abnormality N/A N/A Tenderness on Yes N/A N/A Palpation: Wound Preparation: Ulcer Cleansing: N/A N/A Rinsed/Irrigated with Saline Topical Anesthetic Applied: Other:  lidocaine 4% Treatment Notes Wound #1 (Left, Lateral Lower Leg) 1. Cleansed with: Clean wound with Normal Saline Cleanse wound with antibacterial soap and water 2. Anesthetic Topical Lidocaine 4% cream to wound bed prior to debridement 3. Peri-wound Care: Barrier cream 4. Dressing Applied: Other dressing (specify in notes) 5. Secondary Dressing Applied ABD Pad 7. Secured with Tape 3 Layer Compression System - Left Lower Extremity Notes unna to anchor, drawtex, PolyMem Moris, Marnae F. ([id number]) Electronic Signature(s) Signed: 02/24/2017 3:48:14 PM By: Christin Fudge MD, FACS Entered By: Christin Fudge on 02/24/2017 14:35:48 Laura [name], [name] ([id number]) -------------------------------------------------------------------------------- [place] Details Patient Name: Laura [name], [name] Date of Service: 02/24/2017 1:30 PM Medical Record Number: [id number] Patient Account Number: [id number] Date of Birth/Sex: June 16, 1925 (83 y.o. Female) Treating RN: Montey Hora Primary Care Eero Dini: Halina Maidens Other Clinician: Referring Kailei Cowens: Halina Maidens Treating Dori Devino/Extender: Frann Rider in Treatment: 8 Active Inactive ` Abuse / Safety / Falls / Self Care Management Nursing Diagnoses: Potential for falls Goals: Patient will not experience any injury related to falls Date Initiated: 12/30/2016 Target Resolution Date: 04/29/2017 Goal Status: Active Interventions: Assess Activities of Daily Living upon admission and as needed Assess: immobility, friction, shearing, incontinence upon admission and as needed Notes: ` Nutrition Nursing Diagnoses: Imbalanced nutrition Impaired glucose control: actual or potential Potential for alteratiion in Nutrition/Potential for imbalanced nutrition Goals: Patient/caregiver will maintain therapeutic glucose control Date Initiated: 12/30/2016 Target Resolution Date: 04/29/2017 Goal Status: Active Interventions: Assess  patient nutrition upon admission and as needed per policy Notes: ` Orientation to the [place], [name] ([id number]) Nursing Diagnoses: Knowledge deficit related to the wound healing center program Goals: Patient/caregiver will verbalize understanding of the [place] Date Initiated: 12/30/2016 Target Resolution Date: 01/21/2017 Goal Status: Active Interventions: Provide education on orientation to the wound center Notes: ` Pain, Acute or Chronic Nursing Diagnoses: Pain, acute or chronic: actual or potential Potential alteration in comfort, pain Goals: Patient/caregiver will verbalize adequate pain control between visits Date Initiated: 12/30/2016 Target Resolution Date: 04/29/2017 Goal Status: Active Interventions: Complete pain assessment as per visit requirements Notes: ` Wound/Skin Impairment Nursing Diagnoses: Impaired tissue integrity Knowledge deficit related to smoking impact on wound healing Knowledge deficit related to ulceration/compromised skin integrity Goals: Ulcer/skin breakdown will have a volume reduction of 80% by week 12 Date Initiated: 12/30/2016 Target Resolution Date: 04/22/2017 Goal Status: Active Interventions: Assess patient/caregiver ability to perform ulcer/skin care regimen upon admission and as needed Notes: Laura [name], Laura [name] ([id number]) Electronic Signature(s) Signed: 02/24/2017 5:02:56 PM By: Montey Hora Entered By: Montey Hora on 02/24/2017 13:51:17 Laura [name], [name] ([id number]) -------------------------------------------------------------------------------- Pain Assessment Details Patient Name: Laura [name], [name] Date of Service:  02/24/2017 1:30 PM Medical Record Number: [id number] Patient Account Number: [id number] Date of Birth/Sex: Apr 26, 1925 (83 y.o. Female) Treating RN: Carolyne Fiscal, Debi Primary Care Omar Gayden: Halina Maidens Other Clinician: Referring Jericho Cieslik: Halina Maidens Treating Zeniah Briney/Extender: Frann Rider in Treatment: 8 Active Problems Location of Pain Severity and Description of Pain Patient Has Paino No Site Locations Pain Management and Medication Current Pain Management: Electronic Signature(s) Signed: 02/24/2017 4:50:20 PM By: Alric Quan Entered By: Alric Quan on 02/24/2017 13:38:48 Laura [name], [name] ([id number]) -------------------------------------------------------------------------------- Patient/Caregiver Education Details Patient Name: [name], Aime F. Date of Service: 02/24/2017 1:30 PM Medical Record Number: [id number] Patient Account Number: [id number] Date of Birth/Gender: 11-14-1924 (83 y.o. Female) Treating RN: Montey Hora Primary Care Physician: Halina Maidens Other Clinician: Referring Physician: Halina Maidens Treating Physician/Extender: Frann Rider in Treatment: 8 Education Assessment Education Provided To: Patient Education Topics Provided Wound/Skin Impairment: Handouts: Other: change dressing as ordered Methods: Demonstration, Explain/Verbal Responses: State content correctly Electronic Signature(s) Signed: 02/24/2017 5:02:56 PM By: Montey Hora Entered By: Montey Hora on 02/24/2017 13:59:23 Laura [name], [name] ([id number]) -------------------------------------------------------------------------------- Wound Assessment Details Patient Name: Laura [name], [name] Date of Service: 02/24/2017 1:30 PM Medical Record Number: [id number] Patient Account Number: [id number] Date of Birth/Sex: [birth year] (83 y.o. Female) Treating RN: Montey Hora Primary Care Raylee Strehl: Halina Maidens Other Clinician: Referring Alyzza Andringa: Halina Maidens Treating Sydna Brodowski/Extender: Frann Rider in Treatment: 8 Wound Status Wound Number: 1 Primary Diabetic Wound/Ulcer of the Lower Etiology: Extremity Wound Location: Left Lower Leg - Lateral Wound Status: Open Wounding Event: Gradually Appeared Comorbid Type II Diabetes Date Acquired:  12/16/2016 History: Weeks Of Treatment: 8 Clustered Wound: No Photos Photo Uploaded By: Alric Quan on 02/24/2017 15:21:39 Wound Measurements Length: (cm) 3.8 Width: (cm) 2.7 Depth: (cm) 0.1 Area: (cm) 8.058 Volume: (cm) 0.806 % Reduction in Area: -720.6% % Reduction in Volume: -722.4% Epithelialization: Small (1-33%) Tunneling: No Undermining: No Wound Description Classification: Grade 1 Wound Margin: Flat and Intact Exudate Amount: Large Exudate Type: Serous Exudate Color: amber Foul Odor After Cleansing: No Slough/Fibrino Yes Wound Bed Granulation Amount: Large (67-100%) Exposed Structure Necrotic Amount: Small (1-33%) Fascia Exposed: No Necrotic Quality: Adherent Slough Fat Layer (Subcutaneous Tissue) Exposed: Yes Tendon Exposed: No Campisi, Kaylinn F. ([id number]) Muscle Exposed: No Joint Exposed: No Bone Exposed: No Periwound Skin Texture Texture Color No Abnormalities Noted: No No Abnormalities Noted: No Callus: No Atrophie Blanche: No Crepitus: No Cyanosis: No Excoriation: Yes Ecchymosis: No Induration: No Erythema: Yes Rash: No Erythema Location: Circumferential Scarring: No Hemosiderin Staining: No Mottled: No Moisture Pallor: No No Abnormalities Noted: No Rubor: No Dry / Scaly: No Maceration: Yes Temperature / Pain Temperature: No Abnormality Tenderness on Palpation: Yes Wound Preparation Ulcer Cleansing: Rinsed/Irrigated with Saline Topical Anesthetic Applied: Other: lidocaine 4%, Treatment Notes Wound #1 (Left, Lateral Lower Leg) 1. Cleansed with: Clean wound with Normal Saline Cleanse wound with antibacterial soap and water 2. Anesthetic Topical Lidocaine 4% cream to wound bed prior to debridement 3. Peri-wound Care: Barrier cream 4. Dressing Applied: Other dressing (specify in notes) 5. Secondary Dressing Applied ABD Pad 7. Secured with Tape 3 Layer Compression System - Left Lower Extremity Notes unna to anchor, drawtex,  PolyMem Electronic Signature(s) Signed: 02/24/2017 5:02:56 PM By: Montey Hora Entered By: Montey Hora on 02/24/2017 13:50:18 Karlen, Lynnlee F. ([id number]) Difrancesco, Daren F. ([id number]) -------------------------------------------------------------------------------- Vitals Details Patient Name: Strathman, Elayjah F. Date of Service: 02/24/2017 1:30 PM Medical Record Number: [id number] Patient Account Number: [id number] Date of Birth/Sex: 12-04-1924 (83 y.o. Female) Treating RN: Montey Hora Primary Care  Vanya Carberry: Halina Maidens Other Clinician: Referring Devin Ganaway: Halina Maidens Treating Aza Dantes/Extender: Frann Rider in Treatment: 8 Vital Signs Time Taken: 13:39 Temperature (F): 98.3 Height (in): 63 Pulse (bpm): 67 Weight (lbs): 164.8 Respiratory Rate (breaths/min): 16 Body Mass Index (BMI): 29.2 Blood Pressure (mmHg): 136/69 Reference Range: 80 - 120 mg / dl Electronic Signature(s) Signed: 02/24/2017 5:02:56 PM By: Montey Hora Entered By: Montey Hora on 02/24/2017 13:39:28

## 2017-02-28 ENCOUNTER — Ambulatory Visit (INDEPENDENT_AMBULATORY_CARE_PROVIDER_SITE_OTHER): Payer: Medicare PPO | Admitting: Internal Medicine

## 2017-02-28 ENCOUNTER — Encounter: Payer: Self-pay | Admitting: Internal Medicine

## 2017-02-28 VITALS — BP 136/84 | HR 78 | Ht 62.0 in | Wt 161.4 lb

## 2017-02-28 DIAGNOSIS — R600 Localized edema: Secondary | ICD-10-CM | POA: Diagnosis not present

## 2017-02-28 DIAGNOSIS — N183 Chronic kidney disease, stage 3 unspecified: Secondary | ICD-10-CM

## 2017-02-28 DIAGNOSIS — E1122 Type 2 diabetes mellitus with diabetic chronic kidney disease: Secondary | ICD-10-CM | POA: Diagnosis not present

## 2017-02-28 DIAGNOSIS — Z0000 Encounter for general adult medical examination without abnormal findings: Secondary | ICD-10-CM

## 2017-02-28 DIAGNOSIS — I10 Essential (primary) hypertension: Secondary | ICD-10-CM | POA: Diagnosis not present

## 2017-02-28 DIAGNOSIS — Z23 Encounter for immunization: Secondary | ICD-10-CM | POA: Diagnosis not present

## 2017-02-28 DIAGNOSIS — M1711 Unilateral primary osteoarthritis, right knee: Secondary | ICD-10-CM | POA: Diagnosis not present

## 2017-02-28 DIAGNOSIS — E785 Hyperlipidemia, unspecified: Secondary | ICD-10-CM | POA: Diagnosis not present

## 2017-02-28 DIAGNOSIS — R1310 Dysphagia, unspecified: Secondary | ICD-10-CM | POA: Diagnosis not present

## 2017-02-28 DIAGNOSIS — M7021 Olecranon bursitis, right elbow: Secondary | ICD-10-CM | POA: Diagnosis not present

## 2017-02-28 DIAGNOSIS — E1151 Type 2 diabetes mellitus with diabetic peripheral angiopathy without gangrene: Secondary | ICD-10-CM | POA: Diagnosis not present

## 2017-02-28 DIAGNOSIS — Z89412 Acquired absence of left great toe: Secondary | ICD-10-CM | POA: Diagnosis not present

## 2017-02-28 DIAGNOSIS — S98112A Complete traumatic amputation of left great toe, initial encounter: Secondary | ICD-10-CM

## 2017-02-28 DIAGNOSIS — R1319 Other dysphagia: Secondary | ICD-10-CM

## 2017-02-28 LAB — POCT URINALYSIS DIPSTICK
BILIRUBIN UA: NEGATIVE
Glucose, UA: NEGATIVE
KETONES UA: NEGATIVE
Leukocytes, UA: NEGATIVE
Nitrite, UA: NEGATIVE
PH UA: 6 (ref 5.0–8.0)
Protein, UA: NEGATIVE
RBC UA: NEGATIVE
SPEC GRAV UA: 1.015 (ref 1.010–1.025)
Urobilinogen, UA: 0.2 E.U./dL

## 2017-02-28 NOTE — Patient Instructions (Addendum)
Health Maintenance  Topic Date Due  . DEXA SCAN  10/15/1991  . OPHTHALMOLOGY EXAM  07/22/2015  . INFLUENZA VACCINE  03/20/2017 (Originally 01/18/2017)  . HEMOGLOBIN A1C  03/16/2017  . FOOT EXAM  02/28/2018  . TETANUS/TDAP  02/12/2022  . PNA vac Low Risk Adult  Completed

## 2017-02-28 NOTE — Progress Notes (Signed)
Patient: Laura [name], Female    DOB: August 11, 1924, 83 y.o.   MRN: [id number] Visit Date: 02/28/2017  Today's Provider: Halina Maidens, MD   Chief Complaint  Patient presents with  . Medicare Wellness    Flu Shot - Wants regular. Denied 65+  . Diabetes  . Hypertension   Subjective:    Annual wellness visit Laura [name] is a 83 y.o. female who presents today for her Subsequent Annual Wellness Visit. She feels fairly well. She reports exercising none. She reports she is sleeping fairly well. She denies breast complaints.  She has decided not have routine mammograms.  ----------------------------------------------------------- Dysphagia - has occurred 3 times this year.  Usually with chicken and feels that food sticks at the bottom of her esophagus.  Has to regurgitate food then is fine.  Has never had EGD.  May have had barium swallow years ago.  Now taking mylanta.  She increased her HCTZ briefly due to edema.  She saw only slight improvement so went back to prior dose and edema has remained improved.  She did not have any muscle cramps or lightheadedness.  Knee problems - on right feels crepitus and as if it would give way.  It has not given way.  Xrays were done by Ortho - not severe arthritis.  Injections were recommended. She decided to get another opinion and is seeing Dr. Marry Guan next week.  Stasis dermatitis - of left lower leg.  Getting wound care and dressing changes three times per week.  Currently wrapped with coban. Edema is slightly improved after increase in HCTZ.   Diabetes  She presents for her follow-up diabetic visit. She has type 2 diabetes mellitus. Her disease course has been stable. Pertinent negatives for hypoglycemia include no confusion, dizziness or headaches. Pertinent negatives for diabetes include no chest pain, no fatigue, no polydipsia and no polyuria. Current diabetic treatment includes oral agent (monotherapy). She is compliant with treatment all of the time. An  ACE inhibitor/angiotensin II receptor blocker is being taken.  Hypertension  This is a chronic problem. The problem is unchanged. Pertinent negatives include no chest pain, headaches, neck pain or shortness of breath. Past treatments include angiotensin blockers, diuretics, calcium channel blockers and central alpha agonists. The current treatment provides significant improvement. There are no compliance problems.    Lab Results  Component Value Date   HGBA1C 5.6 09/13/2016   Lab Results  Component Value Date   CREATININE 1.02 (H) 09/13/2016   Lab Results  Component Value Date   CHOL 244 (H) 08/06/2015   HDL 57 08/06/2015   LDLCALC 163 (H) 08/06/2015   TRIG 119 08/06/2015   CHOLHDL 4.3 08/06/2015    Review of Systems  Constitutional: Negative for chills, fatigue and fever.  HENT: Positive for trouble swallowing.   Eyes: Negative for visual disturbance.  Respiratory: Positive for choking (episodes of dysphagia). Negative for cough, chest tightness and shortness of breath.   Cardiovascular: Negative for chest pain.  Gastrointestinal: Positive for abdominal distention (feels full in upper abdomen). Negative for abdominal pain, constipation and vomiting.  Endocrine: Negative for polydipsia and polyuria.  Genitourinary: Negative for difficulty urinating, frequency and hematuria.  Musculoskeletal: Positive for arthralgias (feels like right elbow bursa still has fluid), gait problem and joint swelling. Negative for myalgias, neck pain and neck stiffness.  Skin: Positive for color change, rash and wound.  Allergic/Immunologic: Positive for environmental allergies.  Neurological: Negative for dizziness, syncope and headaches.  Hematological: Negative for adenopathy. Does not  bruise/bleed easily.  Psychiatric/Behavioral: Negative for confusion, dysphoric mood and sleep disturbance.    Social History   Social History  . Marital status: Widowed    Spouse name: N/A  . Number of  children: N/A  . Years of education: N/A   Occupational History  . Not on file.   Social History Main Topics  . Smoking status: Never Smoker  . Smokeless tobacco: Never Used  . Alcohol use No  . Drug use: No  . Sexual activity: Not on file   Other Topics Concern  . Not on file   Social History Narrative  . No narrative on file    Patient Active Problem List   Diagnosis Date Noted  . Esophageal dysphagia 02/28/2017  . Arthritis of knee, right 09/13/2016  . Effusion of bursa of right elbow 09/13/2016  . Vaginal atrophy 10/24/2015  . Mixed stress and urge urinary incontinence 10/24/2015  . Vasculitis ([place]) 09/21/2015  . DM (diabetes mellitus), type 2 with renal complications ([place]) 44/08/4740  . Carpal tunnel syndrome 01/16/2015  . DM (diabetes mellitus), type 2 with peripheral vascular complications ([place]) 59/56/3875  . Dyslipidemia 01/16/2015  . Essential (primary) hypertension 01/16/2015  . Amputated great toe of left foot ([place]) 01/16/2015  . Hypercalcemia 01/16/2015  . Local edema 01/16/2015  . Lumbar radiculopathy 01/16/2015  . MGUS (monoclonal gammopathy of unknown significance) 01/16/2015  . Dermatitis 01/16/2015  . Hypertensive left ventricular hypertrophy 07/17/2014  . MI (mitral incompetence) 07/17/2014  . TI (tricuspid incompetence) 07/17/2014    Past Surgical History:  Procedure Laterality Date  . APPENDECTOMY    . bone marrow withdrawal    . colon polyp removal    . [place]  . TOE AMPUTATION Left 08/2013  . TOTAL ABDOMINAL HYSTERECTOMY  1963    Her family history includes Diabetes in her mother; Hypertension in her father; Stroke in her mother.     Previous Medications   ACCU-CHEK AVIVA PLUS TEST STRIP    USE ONE STRIP TO CHECK GLUCOSE ONCE DAILY   ACETAMINOPHEN (TYLENOL) 500 MG TABLET    Take 500 mg by mouth every 6 (six) hours as needed.   AMLODIPINE (NORVASC) 2.5 MG TABLET    Take 1 tablet by mouth daily.   ASPIRIN 81 MG CHEWABLE  TABLET    Chew 1 tablet by mouth daily.   CHOLECALCIFEROL (VITAMIN D) 1000 UNITS TABLET    Take 1,000 Units by mouth daily.   CLOBETASOL OINTMENT (TEMOVATE) 0.05 %    CLOBETASOL PROPIONATE, 0.05% (External Ointment) - Historical Medication  appication two times daily (0.05 %) Active   CLONIDINE (CATAPRES) 0.2 MG TABLET    TAKE ONE TABLET BY MOUTH TWICE DAILY   FEXOFENADINE (ALLEGRA) 180 MG TABLET    Take 1 tablet by mouth daily as needed.   IBUPROFEN (ADVIL,MOTRIN) 200 MG TABLET    Take 400 mg by mouth 2 (two) times daily.    LOSARTAN (COZAAR) 100 MG TABLET    TAKE ONE TABLET BY MOUTH ONCE DAILY   METFORMIN (GLUCOPHAGE) 500 MG TABLET    Take 1 tablet (500 mg total) by mouth daily.   MOMETASONE (NASONEX) 50 MCG/ACT NASAL SPRAY    Place 2 sprays into the nose daily as needed.   TRIAMTERENE-HYDROCHLOROTHIAZIDE (MAXZIDE-25) 37.5-25 MG TABLET    Take 1 tablet by mouth daily.    Patient Care Team: Glean Hess, MD as PCP - General (Internal Medicine) Corey Skains, MD as Consulting Physician (Cardiology) Schnier,  Dolores Lory, MD (Vascular Surgery) Samara Deist, DPM as Referring Physician (Podiatry) Emmaline Kluver., MD (Rheumatology) Leanor Kail, MD (Unknown Physician Specialty)      Objective:   Vitals: BP 136/84   Pulse 78   Ht 5\' 2"  (1.575 m)   Wt 161 lb 6.4 oz (73.2 kg)   SpO2 98%   BMI 29.52 kg/m   Physical Exam  Constitutional: She is oriented to person, place, and time. She appears well-developed and well-nourished. No distress.  HENT:  Head: Normocephalic and atraumatic.  Right Ear: Tympanic membrane and ear canal normal.  Left Ear: Tympanic membrane and ear canal normal.  Nose: Right sinus exhibits no maxillary sinus tenderness. Left sinus exhibits no maxillary sinus tenderness.  Mouth/Throat: Uvula is midline and oropharynx is clear and moist.  Eyes: Conjunctivae and EOM are normal. Right eye exhibits no discharge. Left eye exhibits no discharge. No  scleral icterus.  Neck: Normal range of motion. Carotid bruit is not present. No erythema present. No thyromegaly present.  Cardiovascular: Normal rate, regular rhythm, normal heart sounds and normal pulses.   Pulmonary/Chest: Effort normal. No respiratory distress. She has no wheezes. Right breast exhibits no mass, no nipple discharge, no skin change and no tenderness. Left breast exhibits no mass, no nipple discharge, no skin change and no tenderness.  Abdominal: Soft. Bowel sounds are normal. She exhibits no distension, no ascites and no mass. There is no hepatosplenomegaly. There is no tenderness. There is no CVA tenderness. No hernia.  Musculoskeletal:       Right knee: She exhibits decreased range of motion. She exhibits no swelling and no effusion.       Arms: Lymphadenopathy:    She has no cervical adenopathy.    She has no axillary adenopathy.  Neurological: She is alert and oriented to person, place, and time. She has normal reflexes. No cranial nerve deficit or sensory deficit.  Skin: Skin is warm, dry and intact. No rash noted.     Psychiatric: She has a normal mood and affect. Her speech is normal and behavior is normal. Thought content normal.  Nursing note and vitals reviewed.   Activities of Daily Living In your present state of health, do you have any difficulty performing the following activities: 03/23/2016  Hearing? N  Vision? N  Difficulty concentrating or making decisions? N  Walking or climbing stairs? Y  Dressing or bathing? N  Doing errands, shopping? N  Some recent data might be hidden    Fall Risk Assessment Fall Risk  03/23/2016 03/23/2016 01/16/2015  Falls in the past year? Yes Yes No  Number falls in past yr: 1 1 -  Injury with Fall? Yes Yes -  Risk for fall due to : History of fall(s) History of fall(s) -  Risk for fall due to: Comment - 3 other -  Follow up Follow up appointment Follow up appointment -     Depression Screen PHQ 2/9 Scores 03/23/2016  01/16/2015  PHQ - 2 Score 0 0    No flowsheet data found.  Medicare Annual Wellness Visit Summary:  Reviewed patient's Family Medical History Reviewed and updated list of patient's medical providers Assessment of cognitive impairment was done Assessed patient's functional ability Established a written schedule for health screening [place] Completed and Reviewed  Exercise Activities and Dietary recommendations Goals    None      Immunization History  Administered Date(s) Administered  . Influenza,inj,Quad PF,6+ Mos 03/23/2016, 02/28/2017  . Influenza-Unspecified  05/01/2015  . Pneumococcal Conjugate-13 07/28/2014  . Pneumococcal Polysaccharide-23 06/22/2003  . Tdap 02/13/2012    Health Maintenance  Topic Date Due  . DEXA SCAN  10/15/1991  . OPHTHALMOLOGY EXAM  07/22/2015  . INFLUENZA VACCINE  03/20/2017 (Originally 01/18/2017)  . HEMOGLOBIN A1C  03/16/2017  . FOOT EXAM  02/28/2018  . TETANUS/TDAP  02/12/2022  . PNA vac Low Risk Adult  Completed    Discussed health benefits of physical activity, and encouraged her to engage in regular exercise appropriate for her age and condition.    ------------------------------------------------------------------------------------------------------------  Assessment & Plan:  1. Medicare annual wellness visit, subsequent Measures satisfied  2. Essential (primary) hypertension controlled - CBC with Differential/Platelet - POCT urinalysis dipstick  3. DM (diabetes mellitus), type 2 with peripheral vascular complications (HCC) Leg wound slowly improving - Comprehensive metabolic panel - Hemoglobin A1c - TSH  4. Type 2 diabetes mellitus with stage 3 chronic kidney disease, without long-term current use of insulin (HCC) Check labs  5. Hypercalcemia monitoring  6. Dyslipidemia Likely aged out of need for primary prevention - Lipid panel  7. Amputated great toe of left foot ([place]) Remains healed  8.  Need for influenza vaccination - Flu Vaccine QUAD 36+ mos IM  9. Esophageal dysphagia Continue to monitor for progressive sx Barium swallow if recurrent  10. Local edema Continue hctz 37.5 mg - can take 50 mg intermittently if needed  11. Arthritis of knee, right Bone on bone per recent xray Seeing Ortho next week  12. Effusion of bursa of right elbow Stable with mild scar tissue causing enlargement   No orders of the defined types were placed in this encounter.   Partially dictated using Editor, commissioning. Any errors are unintentional.  Halina Maidens, MD [facility]  02/28/2017

## 2017-03-01 ENCOUNTER — Encounter (INDEPENDENT_AMBULATORY_CARE_PROVIDER_SITE_OTHER): Payer: Medicare PPO | Admitting: Internal Medicine

## 2017-03-01 DIAGNOSIS — L97821 Non-pressure chronic ulcer of other part of left lower leg limited to breakdown of skin: Secondary | ICD-10-CM

## 2017-03-01 DIAGNOSIS — E1151 Type 2 diabetes mellitus with diabetic peripheral angiopathy without gangrene: Secondary | ICD-10-CM

## 2017-03-01 DIAGNOSIS — I10 Essential (primary) hypertension: Secondary | ICD-10-CM

## 2017-03-01 DIAGNOSIS — Z7982 Long term (current) use of aspirin: Secondary | ICD-10-CM

## 2017-03-01 DIAGNOSIS — N183 Chronic kidney disease, stage 3 (moderate): Secondary | ICD-10-CM

## 2017-03-01 DIAGNOSIS — Z7984 Long term (current) use of oral hypoglycemic drugs: Secondary | ICD-10-CM

## 2017-03-01 DIAGNOSIS — R600 Localized edema: Secondary | ICD-10-CM | POA: Diagnosis not present

## 2017-03-01 DIAGNOSIS — Z7951 Long term (current) use of inhaled steroids: Secondary | ICD-10-CM

## 2017-03-01 DIAGNOSIS — E1122 Type 2 diabetes mellitus with diabetic chronic kidney disease: Secondary | ICD-10-CM

## 2017-03-01 DIAGNOSIS — E119 Type 2 diabetes mellitus without complications: Secondary | ICD-10-CM

## 2017-03-01 LAB — CBC WITH DIFFERENTIAL/PLATELET
BASOS ABS: 0.1 10*3/uL (ref 0.0–0.2)
Basos: 1 %
EOS (ABSOLUTE): 0.4 10*3/uL (ref 0.0–0.4)
Eos: 6 %
HEMATOCRIT: 47.6 % — AB (ref 34.0–46.6)
HEMOGLOBIN: 15.8 g/dL (ref 11.1–15.9)
Immature Grans (Abs): 0 10*3/uL (ref 0.0–0.1)
Immature Granulocytes: 0 %
LYMPHS: 15 %
Lymphocytes Absolute: 1 10*3/uL (ref 0.7–3.1)
MCH: 28.7 pg (ref 26.6–33.0)
MCHC: 33.2 g/dL (ref 31.5–35.7)
MCV: 87 fL (ref 79–97)
MONOS ABS: 0.3 10*3/uL (ref 0.1–0.9)
Monocytes: 5 %
NEUTROS ABS: 4.9 10*3/uL (ref 1.4–7.0)
NEUTROS PCT: 73 %
Platelets: 249 10*3/uL (ref 150–379)
RBC: 5.5 x10E6/uL — AB (ref 3.77–5.28)
RDW: 13.9 % (ref 12.3–15.4)
WBC: 6.7 10*3/uL (ref 3.4–10.8)

## 2017-03-01 LAB — COMPREHENSIVE METABOLIC PANEL
ALBUMIN: 4.5 g/dL (ref 3.2–4.6)
ALK PHOS: 72 IU/L (ref 39–117)
ALT: 14 IU/L (ref 0–32)
AST: 23 IU/L (ref 0–40)
Albumin/Globulin Ratio: 1.7 (ref 1.2–2.2)
BUN / CREAT RATIO: 21 (ref 12–28)
BUN: 21 mg/dL (ref 10–36)
Bilirubin Total: 0.5 mg/dL (ref 0.0–1.2)
CALCIUM: 10.7 mg/dL — AB (ref 8.7–10.3)
CO2: 24 mmol/L (ref 20–29)
CREATININE: 1 mg/dL (ref 0.57–1.00)
Chloride: 94 mmol/L — ABNORMAL LOW (ref 96–106)
GFR, EST AFRICAN AMERICAN: 57 mL/min/{1.73_m2} — AB (ref >59)
GFR, EST NON AFRICAN AMERICAN: 50 mL/min/{1.73_m2} — AB (ref >59)
GLOBULIN, TOTAL: 2.7 g/dL (ref 1.5–4.5)
Glucose: 97 mg/dL (ref 65–99)
Potassium: 4.4 mmol/L (ref 3.5–5.2)
Sodium: 138 mmol/L (ref 134–144)
TOTAL PROTEIN: 7.2 g/dL (ref 6.0–8.5)

## 2017-03-01 LAB — HEMOGLOBIN A1C
Est. average glucose Bld gHb Est-mCnc: 117 mg/dL
HEMOGLOBIN A1C: 5.7 % — AB (ref 4.8–5.6)

## 2017-03-01 LAB — LIPID PANEL
CHOL/HDL RATIO: 3.8 ratio (ref 0.0–4.4)
Cholesterol, Total: 240 mg/dL — ABNORMAL HIGH (ref 100–199)
HDL: 63 mg/dL (ref >39)
LDL CALC: 156 mg/dL — AB (ref 0–99)
Triglycerides: 106 mg/dL (ref 0–149)
VLDL CHOLESTEROL CAL: 21 mg/dL (ref 5–40)

## 2017-03-01 LAB — TSH: TSH: 3.07 u[IU]/mL (ref 0.450–4.500)

## 2017-03-01 NOTE — Progress Notes (Signed)
Home Health orders from [facility] received. Start of care 02/15/17.  Certification period 02/15/17 through 04/15/17. Orders reviewed, signed and mailed.

## 2017-03-03 ENCOUNTER — Encounter: Payer: Medicare PPO | Admitting: Surgery

## 2017-03-06 ENCOUNTER — Ambulatory Visit: Payer: Medicare PPO | Admitting: Surgery

## 2017-03-10 ENCOUNTER — Encounter: Payer: Medicare PPO | Admitting: Surgery

## 2017-03-10 DIAGNOSIS — E11622 Type 2 diabetes mellitus with other skin ulcer: Secondary | ICD-10-CM | POA: Diagnosis not present

## 2017-03-12 NOTE — Progress Notes (Signed)
CIFELLI, Jamaiyah F. ([id number]) Visit Report for 03/10/2017 Arrival Information Details Patient Name: Laura [name], [name] Date of Service: 03/10/2017 1:30 PM Medical Record Number: [id number] Patient Account Number: [id number] Date of Birth/Sex: 05-02-1925 (83 y.o. Female) Treating RN: Montey Hora Primary Care Dyllon Henken: Halina Maidens Other Clinician: Referring Pama Roskos: Halina Maidens Treating Jax Abdelrahman/Extender: Frann Rider in Treatment: 10 Visit Information History Since Last Visit Added or deleted any medications: No Patient Arrived: Laura [name] Any new allergies or adverse reactions: No Arrival Time: 13:32 Had a fall or experienced change in No Accompanied By: friend activities of daily living that may affect Transfer Assistance: None risk of falls: Patient Identification Verified: Yes Signs or symptoms of abuse/neglect since last No Secondary Verification Process Completed: Yes visito Patient Requires Transmission-Based No Hospitalized since last visit: No Precautions: Has Dressing in Place as Prescribed: Yes Patient Has Alerts: Yes Has Compression in Place as Prescribed: Yes Patient Alerts: DM II Pain Present Now: No Electronic Signature(s) Signed: 03/10/2017 5:20:28 PM By: Montey Hora Entered By: Montey Hora on 03/10/2017 13:33:12 Mennella, Gianny F. ([id number]) -------------------------------------------------------------------------------- Compression Therapy Details Patient Name: Stockman, Klynn F. Date of Service: 03/10/2017 1:30 PM Medical Record Number: [id number] Patient Account Number: [id number] Date of Birth/Sex: December 25, 1924 (83 y.o. Female) Treating RN: Montey Hora Primary Care Manuel Dall: Halina Maidens Other Clinician: Referring Pleasant Bensinger: Halina Maidens Treating Vamsi Apfel/Extender: Frann Rider in Treatment: 10 Compression Therapy Performed for Wound Wound #1 Left,Lateral Lower Leg Assessment: Performed By: Clinician Montey Hora, RN Compression Type:  Three Layer Pre Treatment ABI: 0.5 Post Procedure Diagnosis Same as Pre-procedure Electronic Signature(s) Signed: 03/10/2017 5:20:28 PM By: Montey Hora Entered By: Montey Hora on 03/10/2017 16:48:25 Kriesel, Vallarie F. ([id number]) -------------------------------------------------------------------------------- Encounter Discharge Information Details Patient Name: Betsch, Shadara F. Date of Service: 03/10/2017 1:30 PM Medical Record Number: [id number] Patient Account Number: [id number] Date of Birth/Sex: 03/11/1925 (83 y.o. Female) Treating RN: Carolyne Fiscal, Debi Primary Care Geremy Rister: Halina Maidens Other Clinician: Referring Leonidas Boateng: Halina Maidens Treating Elyshia Kumagai/Extender: Frann Rider in Treatment: 10 Encounter Discharge Information Items Discharge Pain Level: 0 Discharge Condition: Stable Ambulatory Status: Cane Discharge Destination: Home Transportation: Private Auto Accompanied By: friend Schedule Follow-up Appointment: Yes Medication Reconciliation completed No and provided to Patient/Care Helene Bernstein: Provided on Clinical Summary of Care: 03/10/2017 Form Type Recipient Paper Patient [facility] Electronic Signature(s) Signed: 03/10/2017 5:11:04 PM By: Alric Quan Entered By: Alric Quan on 03/10/2017 15:29:06 Meaney, Media F. ([id number]) -------------------------------------------------------------------------------- Lower Extremity Assessment Details Patient Name: Cordrey, [name] Date of Service: 03/10/2017 1:30 PM Medical Record Number: [id number] Patient Account Number: [id number] Date of Birth/Sex: Jun 26, 1924 (83 y.o. Female) Treating RN: Montey Hora Primary Care Orlin Kann: Halina Maidens Other Clinician: Referring Clarissa Laird: Halina Maidens Treating Idania Desouza/Extender: Frann Rider in Treatment: 10 Vascular Assessment Pulses: Dorsalis Pedis Palpable: [Left:Yes] Posterior Tibial Extremity colors, hair growth, and conditions: Extremity Color:  [Left:Hyperpigmented] Hair Growth on Extremity: [Left:No] Temperature of Extremity: [Left:Warm] Capillary Refill: [Left:< 3 seconds] Electronic Signature(s) Signed: 03/10/2017 5:20:28 PM By: Montey Hora Entered By: Montey Hora on 03/10/2017 13:43:53 Soberanis, Jozi F. ([id number]) -------------------------------------------------------------------------------- Multi Wound Chart Details Patient Name: Froning, [name] Date of Service: 03/10/2017 1:30 PM Medical Record Number: [id number] Patient Account Number: [id number] Date of Birth/Sex: Mar 11, 1925 (83 y.o. Female) Treating RN: Montey Hora Primary Care Cameran Pettey: Halina Maidens Other Clinician: Referring Shavonte Zhao: Halina Maidens Treating Judit Awad/Extender: Frann Rider in Treatment: 10 Vital Signs Height(in): 63 Pulse(bpm): 70 Weight(lbs): 164.8 Blood Pressure 166/77 (mmHg): Body Mass Index(BMI): 29 Temperature(F): 98.2 Respiratory Rate 16 (breaths/min): Photos: [1:No Photos] [N/A:N/A] Wound Location: [  1:Left Lower Leg - Lateral] [N/A:N/A] Wounding Event: [1:Gradually Appeared] [N/A:N/A] Primary Etiology: [1:Diabetic Wound/Ulcer of the Lower Extremity] [N/A:N/A] Comorbid History: [1:Type II Diabetes] [N/A:N/A] Date Acquired: [1:12/16/2016] [N/A:N/A] Weeks of Treatment: [1:10] [N/A:N/A] Wound Status: [1:Open] [N/A:N/A] Measurements L x W x D 5x4.2x0.1 [N/A:N/A] (cm) Area (cm) : [1:16.493] [N/A:N/A] Volume (cm) : [1:1.649] [N/A:N/A] % Reduction in Area: [1:-1579.50%] [N/A:N/A] % Reduction in Volume: -1582.70% [N/A:N/A] Classification: [1:Grade 1] [N/A:N/A] Exudate Amount: [1:Large] [N/A:N/A] Exudate Type: [1:Serous] [N/A:N/A] Exudate Color: [1:amber] [N/A:N/A] Wound Margin: [1:Flat and Intact] [N/A:N/A] Granulation Amount: [1:Large (67-100%)] [N/A:N/A] Necrotic Amount: [1:Small (1-33%)] [N/A:N/A] Exposed Structures: [1:Fat Layer (Subcutaneous Tissue) Exposed: Yes Fascia: No Tendon: No Muscle: No Joint: No  Bone: No] [N/A:N/A] Epithelialization: [1:Small (1-33%)] [N/A:N/A] Periwound Skin Texture: Excoriation: Yes N/A N/A Induration: No Callus: No Crepitus: No Rash: No Scarring: No Periwound Skin Maceration: Yes N/A N/A Moisture: Dry/Scaly: No Periwound Skin Color: Erythema: Yes N/A N/A Atrophie Blanche: No Cyanosis: No Ecchymosis: No Hemosiderin Staining: No Mottled: No Pallor: No Rubor: No Erythema Location: Circumferential N/A N/A Temperature: No Abnormality N/A N/A Tenderness on Yes N/A N/A Palpation: Wound Preparation: Ulcer Cleansing: N/A N/A Rinsed/Irrigated with Saline Topical Anesthetic Applied: Other: lidocaine 4% Treatment Notes Electronic Signature(s) Signed: 03/10/2017 4:09:30 PM By: Christin Fudge MD, FACS Entered By: Christin Fudge on 03/10/2017 14:03:26 Omalley, Charmagne F. ([id number]) -------------------------------------------------------------------------------- [place] Details Patient Name: [name], Citlally F. Date of Service: 03/10/2017 1:30 PM Medical Record Number: [id number] Patient Account Number: [id number] Date of Birth/Sex: 1925/03/15 (83 y.o. Female) Treating RN: Montey Hora Primary Care Eilidh Marcano: Halina Maidens Other Clinician: Referring Chestina Komatsu: Halina Maidens Treating Baley Lorimer/Extender: Frann Rider in Treatment: 10 Active Inactive ` Abuse / Safety / Falls / Self Care Management Nursing Diagnoses: Potential for falls Goals: Patient will not experience any injury related to falls Date Initiated: 12/30/2016 Target Resolution Date: 04/29/2017 Goal Status: Active Interventions: Assess Activities of Daily Living upon admission and as needed Assess: immobility, friction, shearing, incontinence upon admission and as needed Notes: ` Nutrition Nursing Diagnoses: Imbalanced nutrition Impaired glucose control: actual or potential Potential for alteratiion in Nutrition/Potential for imbalanced  nutrition Goals: Patient/caregiver will maintain therapeutic glucose control Date Initiated: 12/30/2016 Target Resolution Date: 04/29/2017 Goal Status: Active Interventions: Assess patient nutrition upon admission and as needed per policy Notes: ` Orientation to the [place], [name] ([id number]) Nursing Diagnoses: Knowledge deficit related to the wound healing center program Goals: Patient/caregiver will verbalize understanding of the [place] Date Initiated: 12/30/2016 Target Resolution Date: 01/21/2017 Goal Status: Active Interventions: Provide education on orientation to the wound center Notes: ` Pain, Acute or Chronic Nursing Diagnoses: Pain, acute or chronic: actual or potential Potential alteration in comfort, pain Goals: Patient/caregiver will verbalize adequate pain control between visits Date Initiated: 12/30/2016 Target Resolution Date: 04/29/2017 Goal Status: Active Interventions: Complete pain assessment as per visit requirements Notes: ` Wound/Skin Impairment Nursing Diagnoses: Impaired tissue integrity Knowledge deficit related to smoking impact on wound healing Knowledge deficit related to ulceration/compromised skin integrity Goals: Ulcer/skin breakdown will have a volume reduction of 80% by week 12 Date Initiated: 12/30/2016 Target Resolution Date: 04/22/2017 Goal Status: Active Interventions: Assess patient/caregiver ability to perform ulcer/skin care regimen upon admission and as needed Notes: ZENAB, GRONEWOLD ([id number]) Electronic Signature(s) Signed: 03/10/2017 5:20:28 PM By: Montey Hora Entered By: Montey Hora on 03/10/2017 13:53:10 Friscia, Galileah F. ([id number]) -------------------------------------------------------------------------------- Pain Assessment Details Patient Name: Glazebrook, Adalena F. Date of Service: 03/10/2017 1:30 PM Medical Record Number: [id number] Patient Account  Number: [id number] Date of Birth/Sex:  1925/05/28 (83 y.o. Female) Treating RN: Montey Hora Primary Care Mailen Newborn: Halina Maidens Other Clinician: Referring Ariellah Faust: Halina Maidens Treating Catheline Hixon/Extender: Frann Rider in Treatment: 10 Active Problems Location of Pain Severity and Description of Pain Patient Has Paino No Site Locations Pain Management and Medication Current Pain Management: Electronic Signature(s) Signed: 03/10/2017 5:20:28 PM By: Montey Hora Entered By: Montey Hora on 03/10/2017 13:33:19 Ensz, Donnita F. ([id number]) -------------------------------------------------------------------------------- Patient/Caregiver Education Details Patient Name: [name], Abella F. Date of Service: 03/10/2017 1:30 PM Medical Record Number: [id number] Patient Account Number: [id number] Date of Birth/Gender: 06/15/1925 (83 y.o. Female) Treating RN: Ahmed Prima Primary Care Physician: Halina Maidens Other Clinician: Referring Physician: Halina Maidens Treating Physician/Extender: Frann Rider in Treatment: 10 Education Assessment Education Provided To: Patient Education Topics Provided Wound/Skin Impairment: Handouts: Other: change dressing as ordered Methods: Demonstration, Explain/Verbal Responses: State content correctly Electronic Signature(s) Signed: 03/10/2017 5:11:04 PM By: Alric Quan Entered By: Alric Quan on 03/10/2017 15:28:51 Hubka, Veronika F. ([id number]) -------------------------------------------------------------------------------- Wound Assessment Details Patient Name: Frankland, Raileigh F. Date of Service: 03/10/2017 1:30 PM Medical Record Number: [id number] Patient Account Number: [id number] Date of Birth/Sex: 22-May-1925 (83 y.o. Female) Treating RN: Montey Hora Primary Care Leanor Voris: Halina Maidens Other Clinician: Referring Averey Koning: Halina Maidens Treating Sixto Bowdish/Extender: Frann Rider in Treatment: 10 Wound Status Wound Number: 1 Primary Diabetic  Wound/Ulcer of the Lower Etiology: Extremity Wound Location: Left Lower Leg - Lateral Wound Status: Open Wounding Event: Gradually Appeared Comorbid Type II Diabetes Date Acquired: 12/16/2016 History: Weeks Of Treatment: 10 Clustered Wound: No Photos Photo Uploaded By: Montey Hora on 03/10/2017 17:19:05 Wound Measurements Length: (cm) 5 Width: (cm) 4.2 Depth: (cm) 0.1 Area: (cm) 16.493 Volume: (cm) 1.649 % Reduction in Area: -1579.5% % Reduction in Volume: -1582.7% Epithelialization: Small (1-33%) Tunneling: No Undermining: No Wound Description Classification: Grade 1 Wound Margin: Flat and Intact Exudate Amount: Large Exudate Type: Serous Exudate Color: amber Foul Odor After Cleansing: No Slough/Fibrino Yes Wound Bed Granulation Amount: Large (67-100%) Exposed Structure Necrotic Amount: Small (1-33%) Fascia Exposed: No Necrotic Quality: Adherent Slough Fat Layer (Subcutaneous Tissue) Exposed: Yes Tendon Exposed: No [place], Mack[place]ee F. ([id number]) Muscle Exposed: No Joint Exposed: No Bone Exposed: No Periwound Skin Texture Texture Color No Abnormalities Noted: No No Abnormalities Noted: No Callus: No Atrophie Blanche: No Crepitus: No Cyanosis: No Excoriation: Yes Ecchymosis: No Induration: No Erythema: Yes Rash: No Erythema Location: Circumferential Scarring: No Hemosiderin Staining: No Mottled: No Moisture Pallor: No No Abnormalities Noted: No Rubor: No Dry / Scaly: No Maceration: Yes Temperature / Pain Temperature: No Abnormality Tenderness on Palpation: Yes Wound Preparation Ulcer Cleansing: Rinsed/Irrigated with Saline Topical Anesthetic Applied: Other: lidocaine 4%, Treatment Notes Wound #1 (Left, Lateral Lower Leg) 1. Cleansed with: Clean wound with Normal Saline 2. Anesthetic Topical Lidocaine 4% cream to wound bed prior to debridement 4. Dressing Applied: Other dressing (specify in notes) 5. Secondary Dressing Applied ABD Pad 7.  Secured with Tape 3 Layer Compression System - Left Lower Extremity Notes unna to anchor, drawtex, PolyMem Electronic Signature(s) Signed: 03/10/2017 5:20:28 PM By: Montey Hora Entered By: Montey Hora on 03/10/2017 13:43:40 Rode, Monna F. ([id number]) -------------------------------------------------------------------------------- Vitals Details Patient Name: Recendez, Caliope F. Date of Service: 03/10/2017 1:30 PM Medical Record Number: [id number] Patient Account Number: [id number] Date of Birth/Sex: 05/30/1925 (83 y.o. Female) Treating RN: Montey Hora Primary Care Leanthony Rhett: Halina Maidens Other Clinician: Referring Winola Drum: Halina Maidens Treating Layanna Charo/Extender: Frann Rider in Treatment: 10 Vital Signs Time Taken: 13:38 Temperature (F): 98.2  Height (in): 63 Pulse (bpm): 70 Weight (lbs): 164.8 Respiratory Rate (breaths/min): 16 Body Mass Index (BMI): 29.2 Blood Pressure (mmHg): 166/77 Reference Range: 80 - 120 mg / dl Electronic Signature(s) Signed: 03/10/2017 5:20:28 PM By: Montey Hora Entered By: Montey Hora on 03/10/2017 13:38:05

## 2017-03-13 NOTE — Progress Notes (Signed)
[name], [name] Visit Report for 03/10/2017 Chief Complaint Document Details Patient Name: Laura [name], [name] Date of Service: 03/10/2017 1:30 PM Medical Record Number: [id number] Patient Account Number: [id number] Date of Birth/Sex: Jun 22, 1924 (83 y.o. Female) Treating RN: Laura [name] Primary Care Provider: Halina [name] Other Clinician: Referring Provider: Halina [name] Treating Provider/Extender: Laura [name] in Treatment: 10 Information Obtained from: Patient Chief Complaint Left lower leg cellulitis Electronic Signature(s) Signed: 03/10/2017 4:09:30 PM By: Laura Fudge MD, FACS Entered By: Laura [name] on 03/10/2017 14:03:33 [name], [name] -------------------------------------------------------------------------------- HPI Details Patient Name: Laura [name], [name] Date of Service: 03/10/2017 1:30 PM Medical Record Number: [id number] Patient Account Number: [id number] Date of Birth/Sex: 06/27/1924 (83 y.o. Female) Treating RN: Laura [name], Laura [name] Primary Care Provider: Halina [name] Other Clinician: Referring Provider: Halina [name] Treating Provider/Extender: Laura [name] in Treatment: 10 History of Present Illness HPI Description: 12/30/16 Patient presents today for initial evaluation concerning an area over her left lateral ankle which she tells me has been intermittent in nature since 2000. However the current opening has been present for about two weeks. She has been tolerating the dressing changes at home with antibiotic ointment but that is really the only treatment that has been initiated at this point. That is other than the oral antibiotics which was Keflex that patient was placed on by her primary care provider prior to being referred to [place]. She does have some discomfort but rates this to be around a 2-3 out of 10 fortunately the redness does not seem to be spreading to any other location as far as her lower extremity is concerned. She does  tell me that in the past antibiotics have been beneficial for her unfortunately the current antibiotics have not been of benefit and no wound culture was performed as of yet. She has previously seen Dr. 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 She does have diabetes and are most recent blood sugars have run between 104 and 106 according to patient's although her last hemoglobin A1c she is unaware of. Patient's current wound does not appear to be significantly open but is rather more of a weeping region of cellulitis. 01/27/17 on evaluation today patient left lower for many wound continues to show signs of erythema surrounding and in fact she is noted to have erythema from her toes up to just below the knee in regard to left lower extremity. She is not having any fevers, chills, nausea, vomiting, or diarrhea at this point. With that being said she is concerned about the silver alginate dressing. She tells me that in the past she used silver and some of the dressings for previous one and did not do well with it. Nonetheless she has discomfort along the wound itself but no other pain noted throughout the left lower extremity. 02/03/17 on evaluation today patient appears to be doing fairly well overall other than the fact that her wound is worsening on the lateral aspect of her left lower extremity. It appears more macerated and even slough covered at this point. With that being said she has been attempting to perform the dressing changes on her own although I'm not sure that she is understanding exactly how this should be done. Nonetheless I do believe the gentamicin may be causing too much maceration she really has not wanted to utilize  the silver alginate dressings past due to a previous issue with this. Overall her erythema  of the left lower extremity is improved she still has significant swelling. No fevers, chills, nausea, or vomiting noted at this time. 02/10/2017 -- Old notes received -- was seen in 2009 by [facility] by Dr. Romie Levee -- his impression was chronic patches of dermatitis on the left lower extremity and current mild swelling. He recommended venous insufficiency venous reflux studies. She had no evidence of arterial insufficiency. Follow-up chronic venous insufficiency study done on 12/16/2007, showed very mild amount of reflux on the great saphenous vein at the knee but the function of the vein is normal and there was no evidence by the venous reflux study.. They referred her to dermatology for a second opinion and would see her back in about 4-6 weeks. She was seen back in follow-up in January 01 2008 and at that time she was asked to use a steroid ointment locally and the wounds are almost completely healed. [name], [name] ([id number]) The patient has told me today that she does not tolerate silver dressing -- and she has not had home health come and change her dressings over the last week. 02/17/2017 -- - had a lower arterial study done which showed a bilateral ABI suggests no significant lower extremity arterial disease and the toe brachial indicis were normal on the right and abnormal on the left. The right ABI was 1.0 and the left was 1.07. The right toe brachial indices was 0.81 on the right and 0.56 on the left. She had biphasic and triphasic flow at the tibial vessels. 02/24/2017 -- the patient has been tolerating a 3 layer wrap and seems to be pleased with her progress Electronic Signature(s) Signed: 03/10/2017 4:09:30 PM By: Laura Fudge MD, FACS Entered By: Laura [name] on 03/10/2017 14:03:39 Laura [name], [name] ([id number]) -------------------------------------------------------------------------------- Physical Exam Details Patient Name: [name], [name] Date of Service: 03/10/2017 1:30  PM Medical Record Number: [id number] Patient Account Number: [id number] Date of Birth/Sex: 1925-03-11 (83 y.o. Female) Treating RN: Laura [name] Primary Care Provider: Halina [name] Other Clinician: Referring Provider: Halina [name] Treating Provider/Extender: Laura [name] in Treatment: 10 Constitutional . Pulse regular. Respirations normal and unlabored. Afebrile. . Eyes Nonicteric. Reactive to light. Ears, Nose, Mouth, and Throat Lips, teeth, and gums WNL.Marland Kitchen Moist mucosa without lesions. Neck supple and nontender. No palpable supraclavicular or cervical adenopathy. Normal sized without goiter. Respiratory WNL. No retractions.. Cardiovascular Pedal Pulses WNL. No clubbing, cyanosis or edema. Lymphatic No adneopathy. No adenopathy. No adenopathy. Musculoskeletal Adexa without tenderness or enlargement.. Digits and nails w/o clubbing, cyanosis, infection, petechiae, ischemia, or inflammatory conditions.. Integumentary (Hair, Skin) No suspicious lesions. No crepitus or fluctuance. No peri-wound warmth or erythema. No masses.Marland Kitchen Psychiatric Judgement and insight Intact.. No evidence of depression, anxiety, or agitation.. Notes the wound is looking much better today and the ulceration is more superficial and much improved. The lymphedema is also better controlled. Electronic Signature(s) Signed: 03/10/2017 4:09:30 PM By: Laura Fudge MD, FACS Entered By: Laura [name] on 03/10/2017 14:04:09 Laura [name], [name] ([id number]) -------------------------------------------------------------------------------- Physician Orders Details Patient Name: Laura [name], [name] Date of Service: 03/10/2017 1:30 PM Medical Record Number: [id number] Patient Account Number: [id number] Date of Birth/Sex: 1925-04-03 (83 y.o. Female) Treating RN: Montey Hora Primary Care Provider: Halina [name] Other Clinician: Referring Provider: Halina [name] Treating Provider/Extender: Laura [name] in  Treatment: 10 Verbal / Phone Orders: No Diagnosis Coding Wound Cleansing Wound #1 Left,Lateral Lower Leg o Clean  wound with wound cleanser. o Cleanse wound with mild soap and water Anesthetic Wound #1 Left,Lateral Lower Leg o Topical Lidocaine 4% cream applied to wound bed prior to debridement Skin Barriers/Peri-Wound Care Wound #1 Left,Lateral Lower Leg o Barrier cream Primary Wound Dressing Wound #1 Left,Lateral Lower Leg o Other: - PolyMem (NO SILVER) Secondary Dressing Wound #1 Left,Lateral Lower Leg o ABD pad o Drawtex Dressing Change Frequency o Change Dressing Monday, Wednesday, Friday - HHRN to change dressing Monday and Wednesday and pt will come to the wound care center on Friday. Follow-up Appointments Wound #1 Left,Lateral Lower Leg o Return Appointment in 1 week. Edema Control Wound #1 Left,Lateral Lower Leg o 3 Layer Compression System - Left Lower Extremity - Please wrap 3cm from toes and 3cm from knee. unna to anchor o Elevate legs to the level of the heart and pump ankles as often as possible Hemme, Signa F. ([id number]) Additional Orders / Instructions Wound #1 Left,Lateral Lower Leg o Increase protein intake. Home Health Wound #1 Left,Lateral Lower Leg o Continue Home Health Visits - [facility] to change dressing Monday and Wednesday. Please order these supplies for the pt. o Home Health Nurse may visit PRN to address patientos wound care needs. o FACE TO FACE ENCOUNTER: MEDICARE and MEDICAID PATIENTS: I certify that this patient is under my care and that I had a face-to-face encounter that meets the physician face-to-face encounter requirements with this patient on this date. The encounter with the patient was in whole or in part for the following MEDICAL CONDITION: (primary reason for [place]) MEDICAL NECESSITY: I certify, that based on my findings, NURSING services are a medically necessary home health service. HOME BOUND  STATUS: I certify that my clinical findings support that this patient is homebound (i.e., Due to illness or injury, pt requires aid of supportive devices such as crutches, cane, wheelchairs, walkers, the use of special transportation or the assistance of another person to leave their place of residence. There is a normal inability to leave the home and doing so requires considerable and taxing effort. Other absences are for medical reasons / religious services and are infrequent or of short duration when for other reasons). o If current dressing causes regression in wound condition, may D/C ordered dressing product/s and apply Normal Saline Moist Dressing daily until next [place] / Other MD appointment. [place] of regression in wound condition at [phone]. o Please direct any NON-WOUND related issues/requests for orders to patient's Primary Care Physician Medications-please add to medication list. Wound #1 Left,Lateral Lower Leg o Other: - Vitamin A, Vitamin C, Zinc, MVI Electronic Signature(s) Signed: 03/10/2017 4:09:30 PM By: Laura Fudge MD, FACS Signed: 03/10/2017 5:20:28 PM By: Montey Hora Entered By: Montey Hora on 03/10/2017 13:54:12 Laura [name], [name] ([id number]) -------------------------------------------------------------------------------- Problem List Details Patient Name: Lineman, Kadedra F. Date of Service: 03/10/2017 1:30 PM Medical Record Number: [id number] Patient Account Number: [id number] Date of Birth/Sex: 1925/01/23 (83 y.o. Female) Treating RN: Laura [name], Laura [name] Primary Care Provider: Halina [name] Other Clinician: Referring Provider: Halina [name] Treating Provider/Extender: Laura [name] in Treatment: 10 Active Problems ICD-10 Encounter Code Description Active Date Diagnosis E11.622 Type 2 diabetes mellitus with other skin ulcer 01/02/2017 Yes L03.116 Cellulitis of left lower limb 01/02/2017 Yes I10 Essential  (primary) hypertension 01/02/2017 Yes I89.0 Lymphedema, not elsewhere classified 01/16/2017 Yes Inactive Problems Resolved Problems Electronic Signature(s) Signed: 03/10/2017 4:09:30 PM By: Laura Fudge MD, FACS Entered By: Laura [name] on 03/10/2017 14:03:22 Laura [name], [name] ([id number]) --------------------------------------------------------------------------------  Progress Note Details Patient Name: Laura [name], [name] Date of Service: 03/10/2017 1:30 PM Medical Record Number: [id number] Patient Account Number: [id number] Date of Birth/Sex: 02/14/1925 (83 y.o. Female) Treating RN: Laura [name], Laura [name] Primary Care Provider: Halina [name] Other Clinician: Referring Provider: Halina [name] Treating Provider/Extender: Laura [name] in Treatment: 10 Subjective Chief Complaint Information obtained from Patient Left lower leg cellulitis History of Present Illness (HPI) 12/30/16 Patient presents today for initial evaluation concerning an area over her left lateral ankle which she tells me has been intermittent in nature since 2000. However the current opening has been present for about two weeks. She has been tolerating the dressing changes at home with antibiotic ointment but that is really the only treatment that has been initiated at this point. That is other than the oral antibiotics which was Keflex that patient was placed on by her primary care provider prior to being referred to [place]. She does have some discomfort but rates this to be around a 2-3 out of 10 fortunately the redness does not seem to be spreading to any other location as far as her lower extremity is concerned. She does tell me that in the past antibiotics have been beneficial for her unfortunately the current antibiotics have not been of benefit and no wound culture was performed as of yet. She has previously seen Dr. 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 She does have diabetes and are most recent blood sugars have run between 104 and 106 according to patient's although her last hemoglobin A1c she is unaware of. Patient's current wound does not appear to be significantly open but is rather more of a weeping region of cellulitis. 01/27/17 on evaluation today patient left lower for many wound continues to show signs of erythema surrounding and in fact she is noted to have erythema from her toes up to just below the knee in regard to left lower extremity. She is not having any fevers, chills, nausea, vomiting, or diarrhea at this point. With that being said she is concerned about the silver alginate dressing. She tells me that in the past she used silver and some of the dressings for previous one and did not do well with it. Nonetheless she has discomfort along the wound itself but no other pain noted throughout the left lower extremity. 02/03/17 on evaluation today patient appears to be doing fairly well overall other than the fact that her wound is worsening on the lateral aspect of her left lower extremity. It appears more macerated and even slough covered at this point. With that being said she has been attempting to perform the dressing changes on her own although I'm not sure that she is understanding exactly how this should be done. Nonetheless I do believe the gentamicin may be causing too much maceration she really has not wanted to utilize the silver alginate dressings past due to a previous issue with this. Overall her erythema of the left lower extremity is improved she still has significant swelling. No fevers, chills, nausea, or vomiting noted at this time. 02/10/2017 -- Old notes received -- was seen in 2009 by [facility] by Dr. Romie Levee -- his impression was chronic patches of dermatitis on the left lower extremity  and current mild swelling. He recommended venous insufficiency venous reflux studies. She had no  evidence of arterial insufficiency. Laura [name], [name] ([id number]) Follow-up chronic venous insufficiency study done on 12/16/2007, showed very mild amount of reflux on the great saphenous vein at the knee but the function of the vein is normal and there was no evidence by the venous reflux study.. They referred her to dermatology for a second opinion and would see her back in about 4-6 weeks. She was seen back in follow-up in January 01 2008 and at that time she was asked to use a steroid ointment locally and the wounds are almost completely healed. The patient has told me today that she does not tolerate silver dressing -- and she has not had home health come and change her dressings over the last week. 02/17/2017 -- - had a lower arterial study done which showed a bilateral ABI suggests no significant lower extremity arterial disease and the toe brachial indicis were normal on the right and abnormal on the left. The right ABI was 1.0 and the left was 1.07. The right toe brachial indices was 0.81 on the right and 0.56 on the left. She had biphasic and triphasic flow at the tibial vessels. 02/24/2017 -- the patient has been tolerating a 3 layer wrap and seems to be pleased with her progress Objective Constitutional Pulse regular. Respirations normal and unlabored. Afebrile. Vitals Time Taken: 1:38 PM, Height: 63 in, Weight: 164.8 lbs, BMI: 29.2, Temperature: 98.2 F, Pulse: 70 bpm, Respiratory Rate: 16 breaths/min, Blood Pressure: 166/77 mmHg. Eyes Nonicteric. Reactive to light. Ears, Nose, Mouth, and Throat Lips, teeth, and gums WNL.Marland Kitchen Moist mucosa without lesions. Neck supple and nontender. No palpable supraclavicular or cervical adenopathy. Normal sized without goiter. Respiratory WNL. No retractions.. Cardiovascular Pedal Pulses WNL. No clubbing, cyanosis or edema. Lymphatic No adneopathy. No  adenopathy. No adenopathy. Laura [name], [name] ([id number]) Musculoskeletal Adexa without tenderness or enlargement.. Digits and nails w/o clubbing, cyanosis, infection, petechiae, ischemia, or inflammatory conditions.Marland Kitchen Psychiatric Judgement and insight Intact.. No evidence of depression, anxiety, or agitation.. General Notes: the wound is looking much better today and the ulceration is more superficial and much improved. The lymphedema is also better controlled. Integumentary (Hair, Skin) No suspicious lesions. No crepitus or fluctuance. No peri-wound warmth or erythema. No masses.. Wound #1 status is Open. Original cause of wound was Gradually Appeared. The wound is located on the Left,Lateral Lower Leg. The wound measures 5cm length x 4.2cm width x 0.1cm depth; 16.493cm^2 area and 1.649cm^3 volume. There is Fat Layer (Subcutaneous Tissue) Exposed exposed. There is no tunneling or undermining noted. There is a large amount of serous drainage noted. The wound margin is flat and intact. There is large (67-100%) granulation within the wound bed. There is a small (1-33%) amount of necrotic tissue within the wound bed including Adherent Slough. The periwound skin appearance exhibited: Excoriation, Maceration, Erythema. The periwound skin appearance did not exhibit: Callus, Crepitus, Induration, Rash, Scarring, Dry/Scaly, Atrophie Blanche, Cyanosis, Ecchymosis, Hemosiderin Staining, Mottled, Pallor, Rubor. The surrounding wound skin color is noted with erythema which is circumferential. Periwound temperature was noted as No Abnormality. The periwound has tenderness on palpation. Assessment Active Problems ICD-10 E11.622 - Type 2 diabetes mellitus with other skin ulcer L03.116 - Cellulitis of left lower limb I10 - Essential (primary) hypertension I89.0 - Lymphedema, not elsewhere classified Procedures Wound #1 Pre-procedure diagnosis of Wound #1 is a Diabetic Wound/Ulcer of the Lower Extremity  located on the Left,Lateral Lower Leg . There was a Three Layer Compression Therapy Procedure with a pre-treatment ABI of 0.5 by  Montey Hora, RN. Post procedure Diagnosis Wound #1: Same as Pre-Procedure Laura [name], [name] ([id number]) Plan Wound Cleansing: Wound #1 Left,Lateral Lower Leg: Clean wound with wound cleanser. Cleanse wound with mild soap and water Anesthetic: Wound #1 Left,Lateral Lower Leg: Topical Lidocaine 4% cream applied to wound bed prior to debridement Skin Barriers/Peri-Wound Care: Wound #1 Left,Lateral Lower Leg: Barrier cream Primary Wound Dressing: Wound #1 Left,Lateral Lower Leg: Other: - PolyMem (NO SILVER) Secondary Dressing: Wound #1 Left,Lateral Lower Leg: ABD pad Drawtex Dressing Change Frequency: Change Dressing Monday, Wednesday, Friday - HHRN to change dressing Monday and Wednesday and pt will come to the wound care center on Friday. Follow-up Appointments: Wound #1 Left,Lateral Lower Leg: Return Appointment in 1 week. Edema Control: Wound #1 Left,Lateral Lower Leg: 3 Layer Compression System - Left Lower Extremity - Please wrap 3cm from toes and 3cm from knee. unna to anchor Elevate legs to the level of the heart and pump ankles as often as possible Additional Orders / Instructions: Wound #1 Left,Lateral Lower Leg: Increase protein intake. Home Health: Wound #1 Left,Lateral Lower Leg: Continue Home Health Visits - HHRN to change dressing Monday and Wednesday. Please order these supplies for the pt. Home Health Nurse may visit PRN to address patient s wound care needs. FACE TO FACE ENCOUNTER: MEDICARE and MEDICAID PATIENTS: I certify that this patient is under my care and that I had a face-to-face encounter that meets the physician face-to-face encounter requirements with this patient on this date. The encounter with the patient was in whole or in part for the following MEDICAL CONDITION: (primary reason for [place]) MEDICAL NECESSITY:  I certify, that based on my findings, NURSING services are a medically necessary home health service. HOME BOUND STATUS: I certify that my clinical findings support that this patient is homebound (i.e., Due to [place], [place] F. ([id number]) illness or injury, pt requires aid of supportive devices such as crutches, cane, wheelchairs, walkers, the use of special transportation or the assistance of another person to leave their place of residence. There is a normal inability to leave the home and doing so requires considerable and taxing effort. Other absences are for medical reasons / religious services and are infrequent or of short duration when for other reasons). If current dressing causes regression in wound condition, may D/C ordered dressing product/s and apply Normal Saline Moist Dressing daily until next [place] / Other MD appointment. [place] of regression in wound condition at [phone]. Please direct any NON-WOUND related issues/requests for orders to patient's Primary Care Physician Medications-please add to medication list.: Wound #1 Left,Lateral Lower Leg: Other: - Vitamin A, Vitamin C, Zinc, MVI there has been good improvement over the last couple of weeks and I have reviewed details of this with her and recommended: 1. PolyMem foam, drawtext and a 3 layer Profore wrap to be changed 3 times a week. 2. Elevation and exercise has been discussed with her 3. She will return to see [place] next week for a review. I have discussed at length with her that once her wounds have healed he will try and get her into compression stockings or compression wraps for a long-term basis. If her wounds however don't respond well she may need a venous duplex reflux study at a later date. Electronic Signature(s) Signed: 03/13/2017 4:01:09 PM By: Laura Fudge MD, FACS Previous Signature: 03/10/2017 4:09:30 PM Version By: Laura Fudge MD, FACS Entered By: Laura [name] on  03/10/2017 17:08:07 Laura [name], [name] ([id number]) --------------------------------------------------------------------------------  SuperBill Details Patient Name: Laura [name], Rubylee F. Date of Service: 03/10/2017 Medical Record Number: [id number] Patient Account Number: [id number] Date of Birth/Sex: 1925-04-23 (83 y.o. Female) Treating RN: Laura [name], Laura [name] Primary Care Provider: Halina [name] Other Clinician: Referring Provider: Halina [name] Treating Provider/Extender: Laura [name] in Treatment: 10 Diagnosis Coding ICD-10 Codes Code Description E11.622 Type 2 diabetes mellitus with other skin ulcer L03.116 Cellulitis of left lower limb I10 Essential (primary) hypertension I89.0 Lymphedema, not elsewhere classified Facility Procedures CPT4: Description Modifier Quantity Code [id number] (Facility Use Only) [phone] - APPLY [place] [facility] 1 LEG Physician Procedures CPT4 Code: 2248250 Description: 03704 - WC PHYS LEVEL 3 - EST PT ICD-10 Description Diagnosis E11.622 Type 2 diabetes mellitus with other skin ulcer L03.116 Cellulitis of left lower limb I10 Essential (primary) hypertension I89.0 Lymphedema, not elsewhere classified Modifier: Quantity: 1 Electronic Signature(s) Signed: 03/10/2017 4:09:30 PM By: Laura Fudge MD, FACS Signed: 03/10/2017 5:11:04 PM By: Alric Quan Entered By: Alric Quan on 03/10/2017 15:27:49

## 2017-03-17 ENCOUNTER — Encounter: Payer: Medicare PPO | Admitting: Physician Assistant

## 2017-03-17 DIAGNOSIS — E11622 Type 2 diabetes mellitus with other skin ulcer: Secondary | ICD-10-CM | POA: Diagnosis not present

## 2017-03-20 ENCOUNTER — Encounter (INDEPENDENT_AMBULATORY_CARE_PROVIDER_SITE_OTHER): Payer: Self-pay | Admitting: Vascular Surgery

## 2017-03-20 NOTE — Progress Notes (Signed)
TEGELER, Dorissa F. ([id number]) Visit Report for 03/17/2017 Arrival Information Details Patient Name: [name], [name] Date of Service: 03/17/2017 1:30 PM Medical Record Number: [id number] Patient Account Number: [id number] Date of Birth/Sex: [birth year] (83 y.o. Female) Treating RN: Carolyne Fiscal, Debi Primary Care Arvis Zwahlen: Halina Maidens Other Clinician: Referring Neilani Duffee: Halina Maidens Treating Jasman Pfeifle/Extender: Frann Rider in Treatment: 11 Visit Information History Since Last Visit All ordered tests and consults were completed: No Patient Arrived: Kasandra Knudsen Added or deleted any medications: No Arrival Time: 13:24 Any new allergies or adverse reactions: No Accompanied By: son Had a fall or experienced change in No Transfer Assistance: EasyP[employer] activities of daily living that may affect Patient Lift risk of falls: Patient Identification Verified: Yes Signs or symptoms of abuse/neglect since last No Secondary Verification Process Yes visito Completed: Hospitalized since last visit: No Patient Requires Transmission- No Has Dressing in Place as Prescribed: Yes Based Precautions: Has Compression in Place as Prescribed: Yes Patient Has Alerts: Yes Pain Present Now: Yes Patient Alerts: DM II Electronic Signature(s) Signed: 03/20/2017 9:42:14 AM By: Alric Quan Entered By: Alric Quan on 03/17/2017 13:26:57 Hatlestad, Genifer F. ([id number]) -------------------------------------------------------------------------------- Encounter Discharge Information Details Patient Name: [name], [name] Date of Service: 03/17/2017 1:30 PM Medical Record Number: [id number] Patient Account Number: [id number] Date of Birth/Sex: 1924/10/28 (83 y.o. Female) Treating RN: Carolyne Fiscal, Debi Primary Care Aevah Stansbery: Halina Maidens Other Clinician: Referring Shanti Agresti: Halina Maidens Treating Ellyse Rotolo/Extender: Melburn Hake, HOYT Weeks in Treatment: 11 Encounter Discharge Information Items Discharge Pain Level:  0 Discharge Condition: Stable Ambulatory Status: Cane Discharge Destination: Home Transportation: Private Auto Accompanied By: son Schedule Follow-up Appointment: Yes Medication Reconciliation completed No and provided to Patient/Care Nathen Balaban: Provided on Clinical Summary of Care: 03/17/2017 Form Type Recipient Paper Patient [facility] Electronic Signature(s) Signed: 03/20/2017 9:00:05 AM By: Ruthine Dose Entered By: Ruthine Dose on 03/17/2017 14:11:50 Fernholz, Oceania F. ([id number]) -------------------------------------------------------------------------------- Lower Extremity Assessment Details Patient Name: [name], [name] Date of Service: 03/17/2017 1:30 PM Medical Record Number: [id number] Patient Account Number: [id number] Date of Birth/Sex: [birth year] (83 y.o. Female) Treating RN: Carolyne Fiscal, Debi Primary Care Dyllin Gulley: Halina Maidens Other Clinician: Referring Atanacio Melnyk: Halina Maidens Treating Kiora Hallberg/Extender: Frann Rider in Treatment: 11 Edema Assessment Assessed: Shirlyn Goltz: No] Patrice Paradise: No] E[Left: dema] [Right: :] Calf Left: Right: Point of Measurement: 27 cm From Medial Instep 29 cm cm Ankle Left: Right: Point of Measurement: 10 cm From Medial Instep 22.5 cm cm Vascular Assessment Pulses: Dorsalis Pedis Palpable: [Left:Yes] Posterior Tibial Extremity colors, hair growth, and conditions: Extremity Color: [Left:Red] Temperature of Extremity: [Left:Warm] Capillary Refill: [Left:< 3 seconds] Toe Nail Assessment Left: Right: Thick: No Discolored: No Deformed: No Improper Length and Hygiene: No Electronic Signature(s) Signed: 03/20/2017 9:42:14 AM By: Alric Quan Entered By: Alric Quan on 03/17/2017 13:38:25 Colwell, Ryver F. ([id number]) -------------------------------------------------------------------------------- Multi Wound Chart Details Patient Name: [name], [name] Date of Service: 03/17/2017 1:30 PM Medical Record Number: [id number] Patient Account  Number: [id number] Date of Birth/Sex: [birth year] (83 y.o. Female) Treating RN: Ahmed Prima Primary Care Kalan Yeley: Halina Maidens Other Clinician: Referring Weston Fulco: Halina Maidens Treating Geanine Vandekamp/Extender: Frann Rider in Treatment: 11 Vital Signs Height(in): 63 Pulse(bpm): 63 Weight(lbs): 164.8 Blood Pressure 129/66 (mmHg): Body Mass Index(BMI): 29 Temperature(F): 98.3 Respiratory Rate 16 (breaths/min): Photos: [1:No Photos] [N/A:N/A] Wound Location: [1:Left Lower Leg - Lateral] [N/A:N/A] Wounding Event: [1:Gradually Appeared] [N/A:N/A] Primary Etiology: [1:Diabetic Wound/Ulcer of the Lower Extremity] [N/A:N/A] Comorbid History: [1:Type II Diabetes] [N/A:N/A] Date Acquired: [1:12/16/2016] [N/A:N/A] Weeks of Treatment: [1:11] [N/A:N/A] Wound Status: [1:Open] [N/A:N/A] Measurements L x  W x D 5x2.6x0.1 [N/A:N/A] (cm) Area (cm) : [1:10.21] [N/A:N/A] Volume (cm) : [1:1.021] [N/A:N/A] % Reduction in Area: [1:-939.70%] [N/A:N/A] % Reduction in Volume: -941.80% [N/A:N/A] Classification: [1:Grade 1] [N/A:N/A] Exudate Amount: [1:Large] [N/A:N/A] Exudate Type: [1:Serosanguineous] [N/A:N/A] Exudate Color: [1:red, brown] [N/A:N/A] Wound Margin: [1:Flat and Intact] [N/A:N/A] Granulation Amount: [1:Large (67-100%)] [N/A:N/A] Necrotic Amount: [1:Small (1-33%)] [N/A:N/A] Exposed Structures: [1:Fat Layer (Subcutaneous Tissue) Exposed: Yes Fascia: No Tendon: No Muscle: No Joint: No Bone: No] [N/A:N/A] Epithelialization: [1:Small (1-33%)] [N/A:N/A] Periwound Skin Texture: Excoriation: Yes N/A N/A Induration: No Callus: No Crepitus: No Rash: No Scarring: No Periwound Skin Maceration: Yes N/A N/A Moisture: Dry/Scaly: No Periwound Skin Color: Erythema: Yes N/A N/A Atrophie Blanche: No Cyanosis: No Ecchymosis: No Hemosiderin Staining: No Mottled: No Pallor: No Rubor: No Erythema Location: Circumferential N/A N/A Temperature: No Abnormality N/A N/A Tenderness  on Yes N/A N/A Palpation: Wound Preparation: Ulcer Cleansing: N/A N/A Rinsed/Irrigated with Saline Topical Anesthetic Applied: Other: lidocaine 4% Treatment Notes Electronic Signature(s) Signed: 03/20/2017 9:42:14 AM By: Alric Quan Entered By: Alric Quan on 03/17/2017 13:39:40 Abdella, Luzmaria F. ([id number]) -------------------------------------------------------------------------------- Multi-Disciplinary Care Plan Details Patient Name: [name], [name] Date of Service: 03/17/2017 1:30 PM Medical Record Number: [id number] Patient Account Number: [id number] Date of Birth/Sex: 10-24-1924 (83 y.o. Female) Treating RN: Carolyne Fiscal, Debi Primary Care Priya Matsen: Halina Maidens Other Clinician: Referring Jonica Bickhart: Halina Maidens Treating Kierah Goatley/Extender: Frann Rider in Treatment: 11 Active Inactive ` Abuse / Safety / Falls / Self Care Management Nursing Diagnoses: Potential for falls Goals: Patient will not experience any injury related to falls Date Initiated: 12/30/2016 Target Resolution Date: 04/29/2017 Goal Status: Active Interventions: Assess Activities of Daily Living upon admission and as needed Assess: immobility, friction, shearing, incontinence upon admission and as needed Notes: ` Nutrition Nursing Diagnoses: Imbalanced nutrition Impaired glucose control: actual or potential Potential for alteratiion in Nutrition/Potential for imbalanced nutrition Goals: Patient/caregiver will maintain therapeutic glucose control Date Initiated: 12/30/2016 Target Resolution Date: 04/29/2017 Goal Status: Active Interventions: Assess patient nutrition upon admission and as needed per policy Notes: ` Orientation to the [place], [name] ([id number]) Nursing Diagnoses: Knowledge deficit related to the wound healing center program Goals: Patient/caregiver will verbalize understanding of the [facility] Date Initiated: 12/30/2016 Target  Resolution Date: 01/21/2017 Goal Status: Active Interventions: Provide education on orientation to the wound center Notes: ` Pain, Acute or Chronic Nursing Diagnoses: Pain, acute or chronic: actual or potential Potential alteration in comfort, pain Goals: Patient/caregiver will verbalize adequate pain control between visits Date Initiated: 12/30/2016 Target Resolution Date: 04/29/2017 Goal Status: Active Interventions: Complete pain assessment as per visit requirements Notes: ` Wound/Skin Impairment Nursing Diagnoses: Impaired tissue integrity Knowledge deficit related to smoking impact on wound healing Knowledge deficit related to ulceration/compromised skin integrity Goals: Ulcer/skin breakdown will have a volume reduction of 80% by week 12 Date Initiated: 12/30/2016 Target Resolution Date: 04/22/2017 Goal Status: Active Interventions: Assess patient/caregiver ability to perform ulcer/skin care regimen upon admission and as needed Notes: Lowenstein, Jessah F. ([id number]) Electronic Signature(s) Signed: 03/20/2017 9:42:14 AM By: Alric Quan Entered By: Alric Quan on 03/17/2017 13:39:08 Cerro, Gwen F. ([id number]) -------------------------------------------------------------------------------- Pain Assessment Details Patient Name: Lofton, [name] Date of Service: 03/17/2017 1:30 PM Medical Record Number: [id number] Patient Account Number: [id number] Date of Birth/Sex: 03/06/1925 (83 y.o. Female) Treating RN: Ahmed Prima Primary Care Cortavius Montesinos: Halina Maidens Other Clinician: Referring Elouise Divelbiss: Halina Maidens Treating Guthrie Lemme/Extender: Frann Rider in Treatment: 11 Active Problems Location of Pain Severity and Description of Pain Patient  Has Paino Yes Site Locations Pain Location: Pain in Ulcers Rate the pain. Current Pain Level: 4 Character of Pain Describe the Pain: Burning Pain Management and Medication Current Pain Management: Electronic  Signature(s) Signed: 03/20/2017 9:42:14 AM By: Alric Quan Entered By: Alric Quan on 03/17/2017 13:27:14 Mcginn, Shaunte F. ([id number]) -------------------------------------------------------------------------------- Patient/Caregiver Education Details Patient Name: [name], [name] Date of Service: 03/17/2017 1:30 PM Medical Record Number: [id number] Patient Account Number: [id number] Date of Birth/Gender: 23-Jan-1925 (83 y.o. Female) Treating RN: Ahmed Prima Primary Care Physician: Halina Maidens Other Clinician: Referring Physician: Halina Maidens Treating Physician/Extender: Melburn Hake, HOYT Weeks in Treatment: 11 Education Assessment Education Provided To: Patient Education Topics Provided Wound/Skin Impairment: Handouts: Other: change dressing as ordered Methods: Demonstration, Explain/Verbal Responses: State content correctly Electronic Signature(s) Signed: 03/20/2017 9:42:14 AM By: Alric Quan Entered By: Alric Quan on 03/17/2017 14:10:07 Hillary, Estephania F. ([id number]) -------------------------------------------------------------------------------- Wound Assessment Details Patient Name: Goldammer, [name] Date of Service: 03/17/2017 1:30 PM Medical Record Number: [id number] Patient Account Number: [id number] Date of Birth/Sex: [birth year] (83 y.o. Female) Treating RN: Carolyne Fiscal, Debi Primary Care Brysan Mcevoy: Halina Maidens Other Clinician: Referring Ladislao Cohenour: Halina Maidens Treating Quinita Kostelecky/Extender: Frann Rider in Treatment: 11 Wound Status Wound Number: 1 Primary Diabetic Wound/Ulcer of the Lower Etiology: Extremity Wound Location: Left Lower Leg - Lateral Wound Status: Open Wounding Event: Gradually Appeared Comorbid Type II Diabetes Date Acquired: 12/16/2016 History: Weeks Of Treatment: 11 Clustered Wound: No Photos Photo Uploaded By: Alric Quan on 03/17/2017 15:24:28 Wound Measurements Length: (cm) 5 Width: (cm) 2.6 Depth: (cm) 0.1 Area:  (cm) 10.21 Volume: (cm) 1.021 % Reduction in Area: -939.7% % Reduction in Volume: -941.8% Epithelialization: Small (1-33%) Tunneling: No Undermining: No Wound Description Classification: Grade 1 Wound Margin: Flat and Intact Exudate Amount: Large Exudate Type: Serosanguineous Exudate Color: red, brown Foul Odor After Cleansing: No Slough/Fibrino Yes Wound Bed Granulation Amount: Large (67-100%) Exposed Structure Necrotic Amount: Small (1-33%) Fascia Exposed: No Necrotic Quality: Adherent Slough Fat Layer (Subcutaneous Tissue) Exposed: Yes Tendon Exposed: No Duerson, Maysoon F. ([id number]) Muscle Exposed: No Joint Exposed: No Bone Exposed: No Periwound Skin Texture Texture Color No Abnormalities Noted: No No Abnormalities Noted: No Callus: No Atrophie Blanche: No Crepitus: No Cyanosis: No Excoriation: Yes Ecchymosis: No Induration: No Erythema: Yes Rash: No Erythema Location: Circumferential Scarring: No Hemosiderin Staining: No Mottled: No Moisture Pallor: No No Abnormalities Noted: No Rubor: No Dry / Scaly: No Maceration: Yes Temperature / Pain Temperature: No Abnormality Tenderness on Palpation: Yes Wound Preparation Ulcer Cleansing: Rinsed/Irrigated with Saline Topical Anesthetic Applied: Other: lidocaine 4%, Treatment Notes Wound #1 (Left, Lateral Lower Leg) 1. Cleansed with: Clean wound with Normal Saline Cleanse wound with antibacterial soap and water 2. Anesthetic Topical Lidocaine 4% cream to wound bed prior to debridement 3. Peri-wound Care: Barrier cream 4. Dressing Applied: Other dressing (specify in notes) 5. Secondary Dressing Applied ABD Pad 7. Secured with Tape 3 Layer Compression System - Left Lower Extremity Notes unna to anchor, drawtex, PolyMem Electronic Signature(s) Signed: 03/20/2017 9:42:14 AM By: Alric Quan Entered By: Alric Quan on 03/17/2017 13:36:00 Rowley, Gwyneth F. ([id number]) Dumas, Alexzandra F.  ([id number]) -------------------------------------------------------------------------------- Vitals Details Patient Name: Oestreicher, Anam F. Date of Service: 03/17/2017 1:30 PM Medical Record Number: [id number] Patient Account Number: [id number] Date of Birth/Sex: 01/19/1925 (83 y.o. Female) Treating RN: Ahmed Prima Primary Care Tyria Springer: Halina Maidens Other Clinician: Referring Arpi Diebold: Halina Maidens Treating Rucker Pridgeon/Extender: Frann Rider in Treatment: 11 Vital Signs Time Taken: 13:36 Temperature (F): 98.3 Height (in): 63 Pulse (bpm): 63  Weight (lbs): 164.8 Respiratory Rate (breaths/min): 16 Body Mass Index (BMI): 29.2 Blood Pressure (mmHg): 129/66 Reference Range: 80 - 120 mg / dl Electronic Signature(s) Signed: 03/20/2017 9:42:14 AM By: Alric Quan Entered By: Alric Quan on 03/17/2017 13:36:45

## 2017-03-20 NOTE — Progress Notes (Signed)
Laura [name], [name] ([id number]) Visit Report for 03/17/2017 Chief Complaint Document Details Patient Name: Laura [name], [name] Date of Service: 03/17/2017 1:30 PM Medical Record Number: [id number] Patient Account Number: [id number] Date of Birth/Sex: Jul 01, 1924 (83 y.o. Female) Treating RN: Ahmed Prima Primary Care Provider: Halina Maidens Other Clinician: Referring Provider: Halina Maidens Treating Provider/Extender: Frann Rider in Treatment: 11 Information Obtained from: Patient Chief Complaint Left lower leg cellulitis Electronic Signature(s) Signed: 03/17/2017 5:13:37 PM By: Worthy Keeler PA-C Signed: 03/20/2017 7:53:39 AM By: Christin Fudge MD, FACS Entered By: Worthy Keeler on 03/17/2017 13:34:28 Laura [name], [name] ([id number]) -------------------------------------------------------------------------------- HPI Details Patient Name: Laura [name], [name] Date of Service: 03/17/2017 1:30 PM Medical Record Number: [id number] Patient Account Number: [id number] Date of Birth/Sex: 12-Aug-1924 (83 y.o. Female) Treating RN: Carolyne Fiscal, Debi Primary Care Provider: Halina Maidens Other Clinician: Referring Provider: Halina Maidens Treating Provider/Extender: Melburn Hake, Achsah Mcquade Weeks in Treatment: 11 History of Present Illness HPI Description: 12/30/16 Patient presents today for initial evaluation concerning an area over her left lateral ankle which she tells me has been intermittent in nature since 2000. However the current opening has been present for about two weeks. She has been tolerating the dressing changes at home with antibiotic ointment but that is really the only treatment that has been initiated at this point. That is other than the oral antibiotics which was Keflex that patient was placed on by her primary care provider prior to being referred to [place]. She does have some discomfort but rates this to be around a 2-3 out of 10 fortunately the redness does not seem to be spreading to any other  location as far as her lower extremity is concerned. She does tell me that in the past antibiotics have been beneficial for her unfortunately the current antibiotics have not been of benefit and no wound culture was performed as of yet. She has previously seen Dr. 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 She does have diabetes and are most recent blood sugars have run between 104 and 106 according to patient's although her last hemoglobin A1c she is unaware of. Patient's current wound does not appear to be significantly open but is rather more of a weeping region of cellulitis. 01/27/17 on evaluation today patient left lower for many wound continues to show signs of erythema surrounding and in fact she is noted to have erythema from her toes up to just below the knee in regard to left lower extremity. She is not having any fevers, chills, nausea, vomiting, or diarrhea at this point. With that being said she is concerned about the silver alginate dressing. She tells me that in the past she used silver and some of the dressings for previous one and did not do well with it. Nonetheless she has discomfort along the wound itself but no other pain noted throughout the left lower extremity. 02/03/17 on evaluation today patient appears to be doing fairly well overall other than the fact that her wound is worsening on the lateral aspect of her left lower extremity. It appears more macerated and even slough covered at this point. With that being said she has been attempting to perform the dressing changes on her own although I'm not sure that she is understanding exactly how this should be done. Nonetheless I do believe the gentamicin may be  causing too much maceration she really has not wanted to utilize the silver alginate  dressings past due to a previous issue with this. Overall her erythema of the left lower extremity is improved she still has significant swelling. No fevers, chills, nausea, or vomiting noted at this time. 02/10/2017 -- Old notes received -- was seen in 2009 by [facility] by Dr. Romie Levee -- his impression was chronic patches of dermatitis on the left lower extremity and current mild swelling. He recommended venous insufficiency venous reflux studies. She had no evidence of arterial insufficiency. Follow-up chronic venous insufficiency study done on 12/16/2007, showed very mild amount of reflux on the great saphenous vein at the knee but the function of the vein is normal and there was no evidence by the venous reflux study.. They referred her to dermatology for a second opinion and would see her back in about 4-6 weeks. She was seen back in follow-up in January 01 2008 and at that time she was asked to use a steroid ointment locally and the wounds are almost completely healed. Marlin, Kaytlin F. ([id number]) The patient has told me today that she does not tolerate silver dressing -- and she has not had home health come and change her dressings over the last week. 02/17/2017 -- - had a lower arterial study done which showed a bilateral ABI suggests no significant lower extremity arterial disease and the toe brachial indicis were normal on the right and abnormal on the left. The right ABI was 1.0 and the left was 1.07. The right toe brachial indices was 0.81 on the right and 0.56 on the left. She had biphasic and triphasic flow at the tibial vessels. 02/24/2017 -- the patient has been tolerating a 3 layer wrap and seems to be pleased with her progress 03/17/17 on evaluation today patient appears to be doing better in regard to her left lateral lower extremity wound. She has been Tolerating the wraps without complication. No fevers, chills, nausea, or vomiting noted at this time. She is pleased with  how things are progressing. Early complaint is that she is unable to take a shower for such a long time from Wednesday all the way till the following Monday. Electronic Signature(s) Signed: 03/17/2017 5:13:37 PM By: Worthy Keeler PA-C Entered By: Worthy Keeler on 03/17/2017 14:21:01 Laura [name], [name] ([id number]) -------------------------------------------------------------------------------- Physical Exam Details Patient Name: Laura [name], [name] Date of Service: 03/17/2017 1:30 PM Medical Record Number: [id number] Patient Account Number: [id number] Date of Birth/Sex: Nov 22, 1924 (83 y.o. Female) Treating RN: Ahmed Prima Primary Care Provider: Halina Maidens Other Clinician: Referring Provider: Halina Maidens Treating Provider/Extender: STONE III, Young Mulvey Weeks in Treatment: 31 Constitutional Well-nourished and well-hydrated in no acute distress. Respiratory normal breathing without difficulty. clear to auscultation bilaterally. Cardiovascular regular rate and rhythm with normal S1, S2. 1+ pitting edema of the bilateral lower extremities. Psychiatric this patient is able to make decisions and demonstrates good insight into disease process. Alert and Oriented x 3. pleasant and cooperative. Notes Patient's wound appears to show good granulation tissue and overall does appear to be smaller than when I last evaluated her. Her swelling also appears to be doing better. Electronic Signature(s) Signed: 03/17/2017 5:13:37 PM By: Worthy Keeler PA-C Entered By: Worthy Keeler on 03/17/2017 14:21:24 Laura [name], [name] ([id number]) -------------------------------------------------------------------------------- Physician Orders Details Patient Name: Osberg, Retal F. Date of Service: 03/17/2017 1:30 PM Medical Record Number: [id number] Patient Account Number: [id number] Date of Birth/Sex: [birth year] (83 y.o. Female) Treating RN:  Carolyne Fiscal, Debi Primary Care Provider: Halina Maidens Other Clinician: Referring  Provider: Halina Maidens Treating Provider/Extender: Melburn Hake, Masiah Lewing Weeks in Treatment: 47 Verbal / Phone Orders: Yes Clinician: Carolyne Fiscal, Debi Read Back and Verified: Yes Diagnosis Coding ICD-10 Coding Code Description E11.622 Type 2 diabetes mellitus with other skin ulcer L03.116 Cellulitis of left lower limb I10 Essential (primary) hypertension I89.0 Lymphedema, not elsewhere classified Wound Cleansing Wound #1 Left,Lateral Lower Leg o Clean wound with wound cleanser. o Cleanse wound with mild soap and water Anesthetic Wound #1 Left,Lateral Lower Leg o Topical Lidocaine 4% cream applied to wound bed prior to debridement Skin Barriers/Peri-Wound Care Wound #1 Left,Lateral Lower Leg o Barrier cream Primary Wound Dressing Wound #1 Left,Lateral Lower Leg o Other: - PolyMem (NO SILVER) Secondary Dressing Wound #1 Left,Lateral Lower Leg o ABD Laura [name] o Drawtex Dressing Change Frequency o Change Dressing Monday, Wednesday, Friday - HHRN to go change dressing Monday, Wednesday and Friday the week of 03/20/17 - 03/24/17 and resume to normal days Monday and Wednesday on the week after Follow-up Appointments Wanek, Kaitlyne F. ([id number]) Wound #1 Left,Lateral Lower Leg o Return Appointment in 1 week. Edema Control Wound #1 Left,Lateral Lower Leg o 3 Layer Compression System - Left Lower Extremity - Please wrap 3cm from toes and 3cm from knee. unna to anchor o Elevate legs to the level of the heart and pump ankles as often as possible Additional Orders / Instructions Wound #1 Left,Lateral Lower Leg o Increase protein intake. Home Health Wound #1 Left,Lateral Lower Leg o Continue Home Health Visits - [facility] to change dressing Monday and Wednesday. Please order these supplies for the pt. o Home Health Nurse may visit PRN to address patientos wound care needs. o FACE TO FACE ENCOUNTER: MEDICARE and MEDICAID PATIENTS: I certify that this patient is under my  care and that I had a face-to-face encounter that meets the physician face-to-face encounter requirements with this patient on this date. The encounter with the patient was in whole or in part for the following MEDICAL CONDITION: (primary reason for [place]) MEDICAL NECESSITY: I certify, that based on my findings, NURSING services are a medically necessary home health service. HOME BOUND STATUS: I certify that my clinical findings support that this patient is homebound (i.e., Due to illness or injury, pt requires aid of supportive devices such as crutches, cane, wheelchairs, walkers, the use of special transportation or the assistance of another person to leave their place of residence. There is a normal inability to leave the home and doing so requires considerable and taxing effort. Other absences are for medical reasons / religious services and are infrequent or of short duration when for other reasons). o If current dressing causes regression in wound condition, may D/C ordered dressing product/s and apply Normal Saline Moist Dressing daily until next [place] / Other MD appointment. [place] of regression in wound condition at [phone]. o Please direct any NON-WOUND related issues/requests for orders to patient's Primary Care Physician Medications-please add to medication list. Wound #1 Left,Lateral Lower Leg o Other: - Vitamin A, Vitamin C, Zinc, MVI Electronic Signature(s) Signed: 03/17/2017 5:13:37 PM By: Worthy Keeler PA-C Signed: 03/20/2017 9:42:14 AM By: Alric Quan Entered By: Alric Quan on 03/17/2017 14:20:12 Laura [name], [name] ([id number]) Laura [name], [name] ([id number]) -------------------------------------------------------------------------------- Problem List Details Patient Name: Linck, Lamara F. Date of Service: 03/17/2017 1:30 PM Medical Record Number: [id number] Patient Account Number: [id number] Date of Birth/Sex: December 23, 1924  (83 y.o. Female) Treating RN: Carolyne Fiscal,  Debi Primary Care Provider: Halina Maidens Other Clinician: Referring Provider: Halina Maidens Treating Provider/Extender: Frann Rider in Treatment: 11 Active Problems ICD-10 Encounter Code Description Active Date Diagnosis E11.622 Type 2 diabetes mellitus with other skin ulcer 01/02/2017 Yes L03.116 Cellulitis of left lower limb 01/02/2017 Yes I10 Essential (primary) hypertension 01/02/2017 Yes I89.0 Lymphedema, not elsewhere classified 01/16/2017 Yes Inactive Problems Resolved Problems Electronic Signature(s) Signed: 03/17/2017 5:13:37 PM By: Worthy Keeler PA-C Signed: 03/20/2017 7:53:39 AM By: Christin Fudge MD, FACS Entered By: Worthy Keeler on 03/17/2017 13:34:20 Laura [name], [name] ([id number]) -------------------------------------------------------------------------------- Progress Note Details Patient Name: Laura [name], [name] Date of Service: 03/17/2017 1:30 PM Medical Record Number: [id number] Patient Account Number: [id number] Date of Birth/Sex: 1925/05/11 (83 y.o. Female) Treating RN: Carolyne Fiscal, Debi Primary Care Provider: Halina Maidens Other Clinician: Referring Provider: Halina Maidens Treating Provider/Extender: Melburn Hake, Nikcole Eischeid Weeks in Treatment: 11 Subjective Chief Complaint Information obtained from Patient Left lower leg cellulitis History of Present Illness (HPI) 12/30/16 Patient presents today for initial evaluation concerning an area over her left lateral ankle which she tells me has been intermittent in nature since 2000. However the current opening has been present for about two weeks. She has been tolerating the dressing changes at home with antibiotic ointment but that is really the only treatment that has been initiated at this point. That is other than the oral antibiotics which was Keflex that patient was placed on by her primary care provider prior to being referred to [place]. She does have some discomfort but  rates this to be around a 2-3 out of 10 fortunately the redness does not seem to be spreading to any other location as far as her lower extremity is concerned. She does tell me that in the past antibiotics have been beneficial for her unfortunately the current antibiotics have not been of benefit and no wound culture was performed as of yet. She has previously seen Dr. 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 She does have diabetes and are most recent blood sugars have run between 104 and 106 according to patient's although her last hemoglobin A1c she is unaware of. Patient's current wound does not appear to be significantly open but is rather more of a weeping region of cellulitis. 01/27/17 on evaluation today patient left lower for many wound continues to show signs of erythema surrounding and in fact she is noted to have erythema from her toes up to just below the knee in regard to left lower extremity. She is not having any fevers, chills, nausea, vomiting, or diarrhea at this point. With that being said she is concerned about the silver alginate dressing. She tells me that in the past she used silver and some of the dressings for previous one and did not do well with it. Nonetheless she has discomfort along the wound itself but no other pain noted throughout the left lower extremity. 02/03/17 on evaluation today patient appears to be doing fairly well overall other than the fact that her wound is worsening on the lateral aspect of her left lower extremity. It appears more macerated and even slough covered at this point. With that being said she has been attempting to perform the dressing changes on her own although I'm not sure that she is understanding exactly how this should be done. Nonetheless I do  believe the  gentamicin may be causing too much maceration she really has not wanted to utilize the silver alginate dressings past due to a previous issue with this. Overall her erythema of the left lower extremity is improved she still has significant swelling. No fevers, chills, nausea, or vomiting noted at this time. 02/10/2017 -- Old notes received -- was seen in 2009 by [facility] by Dr. Romie Levee -- his impression was chronic patches of dermatitis on the left lower extremity and current mild swelling. He recommended venous insufficiency venous reflux studies. She had no evidence of arterial insufficiency. Hoopes, Shemiah F. ([id number]) Follow-up chronic venous insufficiency study done on 12/16/2007, showed very mild amount of reflux on the great saphenous vein at the knee but the function of the vein is normal and there was no evidence by the venous reflux study.. They referred her to dermatology for a second opinion and would see her back in about 4-6 weeks. She was seen back in follow-up in January 01 2008 and at that time she was asked to use a steroid ointment locally and the wounds are almost completely healed. The patient has told me today that she does not tolerate silver dressing -- and she has not had home health come and change her dressings over the last week. 02/17/2017 -- - had a lower arterial study done which showed a bilateral ABI suggests no significant lower extremity arterial disease and the toe brachial indicis were normal on the right and abnormal on the left. The right ABI was 1.0 and the left was 1.07. The right toe brachial indices was 0.81 on the right and 0.56 on the left. She had biphasic and triphasic flow at the tibial vessels. 02/24/2017 -- the patient has been tolerating a 3 layer wrap and seems to be pleased with her progress 03/17/17 on evaluation today patient appears to be doing better in regard to her left lateral lower extremity wound. She has been Tolerating the wraps  without complication. No fevers, chills, nausea, or vomiting noted at this time. She is pleased with how things are progressing. Early complaint is that she is unable to take a shower for such a long time from Wednesday all the way till the following Monday. Objective Constitutional Well-nourished and well-hydrated in no acute distress. Vitals Time Taken: 1:36 PM, Height: 63 in, Weight: 164.8 lbs, BMI: 29.2, Temperature: 98.3 F, Pulse: 63 bpm, Respiratory Rate: 16 breaths/min, Blood Pressure: 129/66 mmHg. Respiratory normal breathing without difficulty. clear to auscultation bilaterally. Cardiovascular regular rate and rhythm with normal S1, S2. 1+ pitting edema of the bilateral lower extremities. Psychiatric this patient is able to make decisions and demonstrates good insight into disease process. Alert and Oriented x 3. pleasant and cooperative. General Notes: Patient's wound appears to show good granulation tissue and overall does appear to be Laura [name], [name] ([id number]) smaller than when I last evaluated her. Her swelling also appears to be doing better. Integumentary (Hair, Skin) Wound #1 status is Open. Original cause of wound was Gradually Appeared. The wound is located on the Left,Lateral Lower Leg. The wound measures 5cm length x 2.6cm width x 0.1cm depth; 10.21cm^2 area and 1.021cm^3 volume. There is Fat Layer (Subcutaneous Tissue) Exposed exposed. There is no tunneling or undermining noted. There is a large amount of serosanguineous drainage noted. The wound margin is flat and intact. There is large (67-100%) granulation within the wound bed. There is a small (1-33%) amount of necrotic tissue within the wound bed including  Adherent Slough. The periwound skin appearance exhibited: Excoriation, Maceration, Erythema. The periwound skin appearance did not exhibit: Callus, Crepitus, Induration, Rash, Scarring, Dry/Scaly, Atrophie Blanche, Cyanosis, Ecchymosis, Hemosiderin Staining,  Mottled, Pallor, Rubor. The surrounding wound skin color is noted with erythema which is circumferential. Periwound temperature was noted as No Abnormality. The periwound has tenderness on palpation. Assessment Active Problems ICD-10 E11.622 - Type 2 diabetes mellitus with other skin ulcer L03.116 - Cellulitis of left lower limb I10 - Essential (primary) hypertension I89.0 - Lymphedema, not elsewhere classified Plan Wound Cleansing: Wound #1 Left,Lateral Lower Leg: Clean wound with wound cleanser. Cleanse wound with mild soap and water Anesthetic: Wound #1 Left,Lateral Lower Leg: Topical Lidocaine 4% cream applied to wound bed prior to debridement Skin Barriers/Peri-Wound Care: Wound #1 Left,Lateral Lower Leg: Barrier cream Primary Wound Dressing: Wound #1 Left,Lateral Lower Leg: Other: - PolyMem (NO SILVER) Secondary Dressing: Wound #1 Left,Lateral Lower Leg: Padgett, Leelah F. ([id number]) ABD Laura [name] Drawtex Dressing Change Frequency: Change Dressing Monday, Wednesday, Friday - HHRN to go change dressing Monday, Wednesday and Friday the week of 03/20/17 - 03/24/17 and resume to normal days Monday and Wednesday on the week after Follow-up Appointments: Wound #1 Left,Lateral Lower Leg: Return Appointment in 1 week. Edema Control: Wound #1 Left,Lateral Lower Leg: 3 Layer Compression System - Left Lower Extremity - Please wrap 3cm from toes and 3cm from knee. unna to anchor Elevate legs to the level of the heart and pump ankles as often as possible Additional Orders / Instructions: Wound #1 Left,Lateral Lower Leg: Increase protein intake. Home Health: Wound #1 Left,Lateral Lower Leg: Continue Home Health Visits - HHRN to change dressing Monday and Wednesday. Please order these supplies for the pt. Home Health Nurse may visit PRN to address patient s wound care needs. FACE TO FACE ENCOUNTER: MEDICARE and MEDICAID PATIENTS: I certify that this patient is under my care and that I had  a face-to-face encounter that meets the physician face-to-face encounter requirements with this patient on this date. The encounter with the patient was in whole or in part for the following MEDICAL CONDITION: (primary reason for [place]) MEDICAL NECESSITY: I certify, that based on my findings, NURSING services are a medically necessary home health service. HOME BOUND STATUS: I certify that my clinical findings support that this patient is homebound (i.e., Due to illness or injury, pt requires aid of supportive devices such as crutches, cane, wheelchairs, walkers, the use of special transportation or the assistance of another person to leave their place of residence. There is a normal inability to leave the home and doing so requires considerable and taxing effort. Other absences are for medical reasons / religious services and are infrequent or of short duration when for other reasons). If current dressing causes regression in wound condition, may D/C ordered dressing product/s and apply Normal Saline Moist Dressing daily until next [place] / Other MD appointment. [place] of regression in wound condition at [phone]. Please direct any NON-WOUND related issues/requests for orders to patient's Primary Care Physician Medications-please add to medication list.: Wound #1 Left,Lateral Lower Leg: Other: - Vitamin A, Vitamin C, Zinc, MVI I'm going to recommend that we continue with the Current wound care measures per above for the next week. If anything worsens in the interim patient will contact our office for additional recommendations. Otherwise hopefully she will continue to show signs of good improvement. Please see above for specific wound care orders. Maheu, Aurea F. ([id number]) Electronic Signature(s) Signed:  03/17/2017 5:13:37 PM By: Worthy Keeler PA-C Entered By: Worthy Keeler on 03/17/2017 14:21:58 Brokaw, Ilma F.  ([id number]) -------------------------------------------------------------------------------- SuperBill Details Patient Name: Maddy, Kolette F. Date of Service: 03/17/2017 Medical Record Number: [id number] Patient Account Number: [id number] Date of Birth/Sex: 1924-07-17 (83 y.o. Female) Treating RN: Carolyne Fiscal, Debi Primary Care Provider: Halina Maidens Other Clinician: Referring Provider: Halina Maidens Treating Provider/Extender: Melburn Hake, Lindi Abram Weeks in Treatment: 11 Diagnosis Coding ICD-10 Codes Code Description E11.622 Type 2 diabetes mellitus with other skin ulcer L03.116 Cellulitis of left lower limb I10 Essential (primary) hypertension I89.0 Lymphedema, not elsewhere classified Facility Procedures CPT4: Description Modifier Quantity Code [id number] (Facility Use Only) [phone] - APPLY [place] [facility] LEG Physician Procedures CPT4 Code: 8590931 Description: 12162 - WC PHYS LEVEL 3 - EST PT ICD-10 Description Diagnosis E11.622 Type 2 diabetes mellitus with other skin ulcer L03.116 Cellulitis of left lower limb I10 Essential (primary) hypertension I89.0 Lymphedema, not elsewhere classified Modifier: Quantity: 1 Electronic Signature(s) Signed: 03/17/2017 5:13:37 PM By: Worthy Keeler PA-C Signed: 03/20/2017 9:42:14 AM By: Alric Quan Entered By: Alric Quan on 03/17/2017 14:28:49

## 2017-03-24 ENCOUNTER — Encounter: Payer: Medicare PPO | Admitting: Surgery

## 2017-03-31 ENCOUNTER — Encounter: Payer: Medicare PPO | Attending: Surgery | Admitting: Surgery

## 2017-03-31 DIAGNOSIS — Z888 Allergy status to other drugs, medicaments and biological substances status: Secondary | ICD-10-CM | POA: Insufficient documentation

## 2017-03-31 DIAGNOSIS — Z89412 Acquired absence of left great toe: Secondary | ICD-10-CM | POA: Insufficient documentation

## 2017-03-31 DIAGNOSIS — Z880 Allergy status to penicillin: Secondary | ICD-10-CM | POA: Diagnosis not present

## 2017-03-31 DIAGNOSIS — L97911 Non-pressure chronic ulcer of unspecified part of right lower leg limited to breakdown of skin: Secondary | ICD-10-CM | POA: Diagnosis not present

## 2017-03-31 DIAGNOSIS — E11622 Type 2 diabetes mellitus with other skin ulcer: Secondary | ICD-10-CM | POA: Diagnosis not present

## 2017-03-31 DIAGNOSIS — L03116 Cellulitis of left lower limb: Secondary | ICD-10-CM | POA: Diagnosis not present

## 2017-03-31 DIAGNOSIS — I890 Lymphedema, not elsewhere classified: Secondary | ICD-10-CM | POA: Insufficient documentation

## 2017-03-31 DIAGNOSIS — Z87891 Personal history of nicotine dependence: Secondary | ICD-10-CM | POA: Insufficient documentation

## 2017-03-31 DIAGNOSIS — I10 Essential (primary) hypertension: Secondary | ICD-10-CM | POA: Diagnosis not present

## 2017-03-31 DIAGNOSIS — Z7984 Long term (current) use of oral hypoglycemic drugs: Secondary | ICD-10-CM | POA: Insufficient documentation

## 2017-04-02 NOTE — Progress Notes (Signed)
KNIGHT, Brenley F. ([id number]) Visit Report for 03/31/2017 Arrival Information Details Patient Name: [place], [name] Date of Service: 03/31/2017 1:30 PM Medical Record Number: [id number] Patient Account Number: [id number] Date of Birth/Sex: [birth year] (83 y.o. Female) Treating RN: Carolyne Fiscal, Debi Primary Care [place] Paster: Halina Maidens Other Clinician: Referring Darlette Dubow: Halina Maidens Treating Jairon Ripberger/Extender: Frann Rider in Treatment: 86 Visit Information History Since Last Visit All ordered tests and consults were completed: No Patient Arrived: Kasandra Knudsen Added or deleted any medications: No Arrival Time: 13:33 Any new allergies or adverse reactions: No Accompanied By: friend Had a fall or experienced change in No Transfer Assistance: EasyP[employer] activities of daily living that may affect Patient Lift risk of falls: Patient Identification Verified: Yes Signs or symptoms of abuse/neglect since last No Secondary Verification Process Yes visito Completed: Hospitalized since last visit: No Patient Requires Transmission- No Has Dressing in Place as Prescribed: Yes Based Precautions: Has Compression in Place as Prescribed: Yes Patient Has Alerts: Yes Pain Present Now: Yes Patient Alerts: DM II Electronic Signature(s) Signed: 03/31/2017 4:03:52 PM By: Alric Quan Entered By: Alric Quan on 03/31/2017 13:47:14 Gaby, Autie F. ([id number]) -------------------------------------------------------------------------------- Encounter Discharge Information Details Patient Name: Hermance, Meshawn F. Date of Service: 03/31/2017 1:30 PM Medical Record Number: [id number] Patient Account Number: [id number] Date of Birth/Sex: 26-Feb-1925 (83 y.o. Female) Treating RN: Carolyne Fiscal, Debi Primary Care Toby Breithaupt: Halina Maidens Other Clinician: Referring Gatlin Kittell: Halina Maidens Treating Teshara Moree/Extender: Frann Rider in Treatment: 13 Encounter Discharge Information Items Discharge Pain  Level: 0 Discharge Condition: Stable Ambulatory Status: Cane Discharge Destination: Home Transportation: Private Auto Accompanied By: friend Schedule Follow-up Appointment: Yes Medication Reconciliation completed and provided to Patient/Care No Caela Huot: Provided on Clinical Summary of Care: 03/31/2017 Form Type Recipient Paper Patient [facility] Electronic Signature(s) Signed: 03/31/2017 4:20:56 PM By: Ruthine Dose Entered By: Ruthine Dose on 03/31/2017 14:14:22 Mance, Osmara F. ([id number]) -------------------------------------------------------------------------------- Lower Extremity Assessment Details Patient Name: Thumma, Evadne F. Date of Service: 03/31/2017 1:30 PM Medical Record Number: [id number] Patient Account Number: [id number] Date of Birth/Sex: September 26, 1924 (83 y.o. Female) Treating RN: Carolyne Fiscal, Debi Primary Care Karron Alvizo: Halina Maidens Other Clinician: Referring Diara Chaudhari: Halina Maidens Treating Seraphim Affinito/Extender: Frann Rider in Treatment: 13 Edema Assessment Assessed: Shirlyn Goltz: No] Patrice Paradise: No] E[Left: dema] [Right: :] Calf Left: Right: Point of Measurement: 27 cm From Medial Instep 29 cm cm Ankle Left: Right: Point of Measurement: 10 cm From Medial Instep 22.5 cm cm Vascular Assessment Pulses: Dorsalis Pedis Palpable: [Left:Yes] Posterior Tibial Extremity colors, hair growth, and conditions: Extremity Color: [Left:Red] Temperature of Extremity: [Left:Warm] Capillary Refill: [Left:< 3 seconds] Electronic Signature(s) Signed: 03/31/2017 4:03:52 PM By: Alric Quan Entered By: Alric Quan on 03/31/2017 13:50:42 Korn, Alanys F. ([id number]) -------------------------------------------------------------------------------- Multi Wound Chart Details Patient Name: Bentson, Elfie F. Date of Service: 03/31/2017 1:30 PM Medical Record Number: [id number] Patient Account Number: [id number] Date of Birth/Sex: 01/23/1925 (83 y.o. Female) Treating RN: Ahmed Prima Primary Care Imagene Boss: Halina Maidens Other Clinician: Referring Tramya Schoenfelder: Halina Maidens Treating Galileo Colello/Extender: Frann Rider in Treatment: 13 Vital Signs Height(in): 63 Pulse(bpm): 82 Weight(lbs): 164.8 Blood Pressure 164/97 (mmHg): Body Mass Index(BMI): 29 Temperature(F): 98.0 Respiratory Rate 16 (breaths/min): Photos: [1:No Photos] [N/A:N/A] Wound Location: [1:Left Lower Leg - Lateral] [N/A:N/A] Wounding Event: [1:Gradually Appeared] [N/A:N/A] Primary Etiology: [1:Diabetic Wound/Ulcer of the Lower Extremity] [N/A:N/A] Comorbid History: [1:Type II Diabetes] [N/A:N/A] Date Acquired: [1:12/16/2016] [N/A:N/A] Weeks of Treatment: [1:13] [N/A:N/A] Wound Status: [1:Open] [N/A:N/A] Measurements L x W x D 8x6.5x0.1 [N/A:N/A] (cm) Area (cm) : [1:40.841] [N/A:N/A] Volume (cm) : [1:4.084] [N/A:N/A] %  Reduction in Area: [1:-4059.00%] [N/A:N/A] % Reduction in Volume: -4067.30% [N/A:N/A] Classification: [1:Grade 1] [N/A:N/A] Exudate Amount: [1:Large] [N/A:N/A] Exudate Type: [1:Serosanguineous] [N/A:N/A] Exudate Color: [1:red, brown] [N/A:N/A] Wound Margin: [1:Flat and Intact] [N/A:N/A] Granulation Amount: [1:Large (67-100%)] [N/A:N/A] Necrotic Amount: [1:Small (1-33%)] [N/A:N/A] Exposed Structures: [1:Fat Layer (Subcutaneous Tissue) Exposed: Yes Fascia: No Tendon: No Muscle: No Joint: No Bone: No] [N/A:N/A] Epithelialization: [1:Small (1-33%)] [N/A:N/A] Periwound Skin Texture: Excoriation: Yes N/A N/A Induration: No Callus: No Crepitus: No Rash: No Scarring: No Periwound Skin Maceration: Yes N/A N/A Moisture: Dry/Scaly: No Periwound Skin Color: Erythema: Yes N/A N/A Atrophie Blanche: No Cyanosis: No Ecchymosis: No Hemosiderin Staining: No Mottled: No Pallor: No Rubor: No Erythema Location: Circumferential N/A N/A Temperature: No Abnormality N/A N/A Tenderness on Yes N/A N/A Palpation: Wound Preparation: Ulcer Cleansing: N/A  N/A Rinsed/Irrigated with Saline, Other: soap and water Topical Anesthetic Applied: Other: lidocaine 4% Treatment Notes Wound #1 (Left, Lateral Lower Leg) 1. Cleansed with: Clean wound with Normal Saline Cleanse wound with antibacterial soap and water 2. Anesthetic Topical Lidocaine 4% cream to wound bed prior to debridement 3. Peri-wound Care: Barrier cream 4. Dressing Applied: Hydrafera Blue 5. Secondary Dressing Applied ABD Pad 7. Secured with Tape 3 Layer Compression System - Left Lower Extremity Notes unna to anchor, drawtex Opiela, Satsuki F. ([id number]) Electronic Signature(s) Signed: 03/31/2017 2:24:07 PM By: Christin Fudge MD, FACS Entered By: Christin Fudge on 03/31/2017 14:24:06 Madara, Shanetra F. ([id number]) -------------------------------------------------------------------------------- [place] Details Patient Name: [name], Mod[place] F. Date of Service: 03/31/2017 1:30 PM Medical Record Number: [id number] Patient Account Number: [id number] Date of Birth/Sex: [birth year] (83 y.o. Female) Treating RN: Carolyne Fiscal, Debi Primary Care Denece Shearer: Halina Maidens Other Clinician: Referring Kaya Klausing: Halina Maidens Treating Moneka Mcquinn/Extender: Frann Rider in Treatment: 13 Active Inactive ` Abuse / Safety / Falls / Self Care Management Nursing Diagnoses: Potential for falls Goals: Patient will not experience any injury related to falls Date Initiated: 12/30/2016 Target Resolution Date: 04/29/2017 Goal Status: Active Interventions: Assess Activities of Daily Living upon admission and as needed Assess: immobility, friction, shearing, incontinence upon admission and as needed Notes: ` Nutrition Nursing Diagnoses: Imbalanced nutrition Impaired glucose control: actual or potential Potential for alteratiion in Nutrition/Potential for imbalanced nutrition Goals: Patient/caregiver will maintain therapeutic glucose control Date Initiated: 12/30/2016 Target  Resolution Date: 04/29/2017 Goal Status: Active Interventions: Assess patient nutrition upon admission and as needed per policy Notes: ` Orientation to the [place], [name] ([id number]) Nursing Diagnoses: Knowledge deficit related to the wound healing center program Goals: Patient/caregiver will verbalize understanding of the [place] Date Initiated: 12/30/2016 Target Resolution Date: 01/21/2017 Goal Status: Active Interventions: Provide education on orientation to the wound center Notes: ` Pain, Acute or Chronic Nursing Diagnoses: Pain, acute or chronic: actual or potential Potential alteration in comfort, pain Goals: Patient/caregiver will verbalize adequate pain control between visits Date Initiated: 12/30/2016 Target Resolution Date: 04/29/2017 Goal Status: Active Interventions: Complete pain assessment as per visit requirements Notes: ` Wound/Skin Impairment Nursing Diagnoses: Impaired tissue integrity Knowledge deficit related to smoking impact on wound healing Knowledge deficit related to ulceration/compromised skin integrity Goals: Ulcer/skin breakdown will have a volume reduction of 80% by week 12 Date Initiated: 12/30/2016 Target Resolution Date: 04/22/2017 Goal Status: Active Interventions: Assess patient/caregiver ability to perform ulcer/skin care regimen upon admission and as needed Notes: SERAI, TUKES ([id number]) Electronic Signature(s) Signed: 03/31/2017 4:03:52 PM By: Alric Quan Entered By: Alric Quan on 03/31/2017 13:53:48 Adamson, Laurence F. ([id number]) -------------------------------------------------------------------------------- Pain Assessment Details Patient  Name: KAPUSTA, Charna F. Date of Service: 03/31/2017 1:30 PM Medical Record Number: [id number] Patient Account Number: [id number] Date of Birth/Sex: [birth year] (83 y.o. Female) Treating RN: Carolyne Fiscal, Debi Primary Care Jhamal Plucinski: Halina Maidens Other  Clinician: Referring Jacky Hartung: Halina Maidens Treating Lilyanah Celestin/Extender: Frann Rider in Treatment: 13 Active Problems Location of Pain Severity and Description of Pain Patient Has Paino Yes Site Locations Pain Location: Pain in Ulcers With Dressing Change: Yes Rate the pain. Current Pain Level: 3 Character of Pain Describe the Pain: Tender, Throbbing Pain Management and Medication Current Pain Management: Electronic Signature(s) Signed: 03/31/2017 4:03:52 PM By: Alric Quan Entered By: Alric Quan on 03/31/2017 13:47:36 Winthrop, Marrah F. ([id number]) -------------------------------------------------------------------------------- Patient/Caregiver Education Details Patient Name: [name], Dashawna F. Date of Service: 03/31/2017 1:30 PM Medical Record Number: [id number] Patient Account Number: [id number] Date of Birth/Gender: 1924/11/10 (83 y.o. Female) Treating RN: Ahmed Prima Primary Care Physician: Halina Maidens Other Clinician: Referring Physician: Halina Maidens Treating Physician/Extender: Frann Rider in Treatment: 13 Education Assessment Education Provided To: Patient Education Topics Provided Wound/Skin Impairment: Handouts: Other: do not get wrap wet Methods: Demonstration, Explain/Verbal Responses: State content correctly Electronic Signature(s) Signed: 03/31/2017 4:03:52 PM By: Alric Quan Entered By: Alric Quan on 03/31/2017 14:11:58 Milby, Annelisa F. ([id number]) -------------------------------------------------------------------------------- Wound Assessment Details Patient Name: Lattner, Dayzee F. Date of Service: 03/31/2017 1:30 PM Medical Record Number: [id number] Patient Account Number: [id number] Date of Birth/Sex: [birth year] (83 y.o. Female) Treating RN: Carolyne Fiscal, Debi Primary Care Matilde Pottenger: Halina Maidens Other Clinician: Referring Aram Domzalski: Halina Maidens Treating Terin Cragle/Extender: Frann Rider in Treatment:  13 Wound Status Wound Number: 1 Primary Diabetic Wound/Ulcer of the Lower Etiology: Extremity Wound Location: Left Lower Leg - Lateral Wound Status: Open Wounding Event: Gradually Appeared Comorbid Type II Diabetes Date Acquired: 12/16/2016 History: Weeks Of Treatment: 13 Clustered Wound: No Photos Photo Uploaded By: Alric Quan on 03/31/2017 15:51:27 Wound Measurements Length: (cm) 8 Width: (cm) 6.5 Depth: (cm) 0.1 Area: (cm) 40.841 Volume: (cm) 4.084 % Reduction in Area: -4059% % Reduction in Volume: -4067.3% Epithelialization: Small (1-33%) Tunneling: No Undermining: No Wound Description Classification: Grade 1 Wound Margin: Flat and Intact Exudate Amount: Large Exudate Type: Serosanguineous Exudate Color: red, brown Foul Odor After Cleansing: No Slough/Fibrino Yes Wound Bed Granulation Amount: Large (67-100%) Exposed Structure Necrotic Amount: Small (1-33%) Fascia Exposed: No Necrotic Quality: Adherent Slough Fat Layer (Subcutaneous Tissue) Exposed: Yes Tendon Exposed: No Meigs, Chelesa F. ([id number]) Muscle Exposed: No Joint Exposed: No Bone Exposed: No Periwound Skin Texture Texture Color No Abnormalities Noted: No No Abnormalities Noted: No Callus: No Atrophie Blanche: No Crepitus: No Cyanosis: No Excoriation: Yes Ecchymosis: No Induration: No Erythema: Yes Rash: No Erythema Location: Circumferential Scarring: No Hemosiderin Staining: No Mottled: No Moisture Pallor: No No Abnormalities Noted: No Rubor: No Dry / Scaly: No Maceration: Yes Temperature / Pain Temperature: No Abnormality Tenderness on Palpation: Yes Wound Preparation Ulcer Cleansing: Rinsed/Irrigated with Saline, Other: soap and water, Topical Anesthetic Applied: Other: lidocaine 4%, Treatment Notes Wound #1 (Left, Lateral Lower Leg) 1. Cleansed with: Clean wound with Normal Saline Cleanse wound with antibacterial soap and water 2. Anesthetic Topical Lidocaine 4%  cream to wound bed prior to debridement 3. Peri-wound Care: Barrier cream 4. Dressing Applied: Hydrafera Blue 5. Secondary Dressing Applied ABD Pad 7. Secured with Tape 3 Layer Compression System - Left Lower Extremity Notes unna to anchor, drawtex Electronic Signature(s) Signed: 03/31/2017 4:03:52 PM By: Alric Quan Entered By: Alric Quan on 03/31/2017 13:48:57 Eleazer, Jemila F. ([id number]) Gatti, Almarie F. ([id number]) -------------------------------------------------------------------------------- Vitals  Details Patient Name: MARCOS, [name] Date of Service: 03/31/2017 1:30 PM Medical Record Number: [id number] Patient Account Number: [id number] Date of Birth/Sex: 1925-05-08 (83 y.o. Female) Treating RN: Carolyne Fiscal, Debi Primary Care Dayshawn Irizarry: Halina Maidens Other Clinician: Referring Shaleen Talamantez: Halina Maidens Treating Renaud Celli/Extender: Frann Rider in Treatment: 13 Vital Signs Time Taken: 13:47 Temperature (F): 98.0 Height (in): 63 Pulse (bpm): 82 Weight (lbs): 164.8 Respiratory Rate (breaths/min): 16 Body Mass Index (BMI): 29.2 Blood Pressure (mmHg): 164/97 Reference Range: 80 - 120 mg / dl Electronic Signature(s) Signed: 03/31/2017 4:03:52 PM By: Alric Quan Entered By: Alric Quan on 03/31/2017 13:47:56

## 2017-04-03 NOTE — Progress Notes (Signed)
[place], Jericka F. ([id number]) Visit Report for 03/31/2017 Chief Complaint Document Details [name], [name] Date of Service: 03/31/2017 1:30 PM Medical Record Number: [id number] Patient Account Number: [id number] Date of Birth/Sex: [birth year] (83 y.o. Female) Treating RN: Ahmed Prima Primary Care Provider: Halina Maidens Other Clinician: Referring Provider: Halina Maidens Treating Provider/Extender: Frann Rider in Treatment: 13 Information Obtained from: Patient Chief Complaint Left lower leg cellulitis Electronic Signature(s) Signed: 03/31/2017 2:24:14 PM By: Christin Fudge MD, FACS Entered By: Christin Fudge on 03/31/2017 14:24:13 Reisner, Sherrian F. ([id number]) -------------------------------------------------------------------------------- HPI Details [name], [name] Date of Service: 03/31/2017 1:30 PM Medical Record Number: [id number] Patient Account Number: [id number] Date of Birth/Sex: 1925/04/14 (83 y.o. Female) Treating RN: Carolyne Fiscal, Debi Primary Care Provider: Halina Maidens Other Clinician: Referring Provider: Halina Maidens Treating Provider/Extender: Frann Rider in Treatment: 13 History of Present Illness HPI Description: 12/30/16 Patient presents today for initial evaluation concerning an area over her left lateral ankle which she tells me has been intermittent in nature since 2000. However the current opening has been present for about two weeks. She has been tolerating the dressing changes at home with antibiotic ointment but that is really the only treatment that has been initiated at this point. That is other than the oral antibiotics which was Keflex that patient was placed on by her primary care provider prior to being referred to [place]. She does have some discomfort but rates this to be around a 2-3 out of 10 fortunately the redness does not seem to be spreading to any other location as far as her lower extremity is concerned. She  does tell me that in the past antibiotics have been beneficial for her unfortunately the current antibiotics have not been of benefit and no wound culture was performed as of yet. She has previously seen Dr. 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 She does have diabetes and are most recent blood sugars have run between 104 and 106 according to patient's although her last hemoglobin A1c she is unaware of. Patient's current wound does not appear to be significantly open but is rather more of a weeping region of cellulitis. 01/27/17 on evaluation today patient left lower for many wound continues to show signs of erythema surrounding and in fact she is noted to have erythema from her toes up to just below the knee in regard to left lower extremity. She is not having any fevers, chills, nausea, vomiting, or diarrhea at this point. With that being said she is concerned about the silver alginate dressing. She tells me that in the past she used silver and some of the dressings for previous one and did not do well with it. Nonetheless she has discomfort along the wound itself but no other pain noted throughout the left lower extremity. 02/03/17 on evaluation today patient appears to be doing fairly well overall other than the fact that her wound is worsening on the lateral aspect of her left lower extremity. It appears more macerated and even slough covered at this point. With that being said she has been attempting to perform the dressing changes on her own although I'm not sure that she is understanding exactly how this should be done. Nonetheless I do believe the gentamicin may be causing too much maceration she really has not wanted to utilize  the silver alginate dressings past due to a previous issue with this. Overall her  erythema of the left lower extremity is improved she still has significant swelling. No fevers, chills, nausea, or vomiting noted at this time. 02/10/2017 -- Old notes received -- was seen in 2009 by [facility] by Dr. Romie Levee -- his impression was chronic patches of dermatitis on the left lower extremity and current mild swelling. He recommended venous insufficiency venous reflux studies. She had no evidence of arterial insufficiency. Follow-up chronic venous insufficiency study done on 12/16/2007, showed very mild amount of reflux on the great saphenous vein at the knee but the function of the vein is normal and there was no evidence by the venous reflux study.. They referred her to dermatology for a second opinion and would see her back in about 4-6 weeks. She was seen back in follow-up in January 01 2008 and at that time she was asked to use a steroid ointment locally and the wounds are almost completely healed. [name], [name] ([id number]) The patient has told me today that she does not tolerate silver dressing -- and she has not had home health come and change her dressings over the last week. 02/17/2017 -- - had a lower arterial study done which showed a bilateral ABI suggests no significant lower extremity arterial disease and the toe brachial indicis were normal on the right and abnormal on the left. The right ABI was 1.0 and the left was 1.07. The right toe brachial indices was 0.81 on the right and 0.56 on the left. She had biphasic and triphasic flow at the tibial vessels. 02/24/2017 -- the patient has been tolerating a 3 layer wrap and seems to be pleased with her progress 03/17/17 on evaluation today patient appears to be doing better in regard to her left lateral lower extremity wound. She has been Tolerating the wraps without complication. No fevers, chills, nausea, or vomiting noted at this time. She is pleased with how things are progressing. Early complaint is that she is  unable to take a shower for such a long time from Wednesday all the way till the following Monday. 03/31/2017 - the patient and her nurse were concerned about the redness of the wound and the discharge from the wound but other than that everything else has been okay and she has been doing fairly well. She has no constitutional symptoms Electronic Signature(s) Signed: 03/31/2017 2:24:43 PM By: Christin Fudge MD, FACS Entered By: Christin Fudge on 03/31/2017 14:24:43 Thai, Wrenna F. ([id number]) -------------------------------------------------------------------------------- Physical Exam Details Patient Name: Kurihara, Brandice F. Date of Service: 03/31/2017 1:30 PM Medical Record Number: [id number] Patient Account Number: [id number] Date of Birth/Sex: 1925/01/27 (83 y.o. Female) Treating RN: Ahmed Prima Primary Care Provider: Halina Maidens Other Clinician: Referring Provider: Halina Maidens Treating Provider/Extender: Frann Rider in Treatment: 13 Constitutional . Pulse regular. Respirations normal and unlabored. Afebrile. . Eyes Nonicteric. Reactive to light. Ears, Nose, Mouth, and Throat Lips, teeth, and gums WNL.Marland Kitchen Moist mucosa without lesions. Neck supple and nontender. No palpable supraclavicular or cervical adenopathy. Normal sized without goiter. Respiratory WNL. No retractions.. Cardiovascular Pedal Pulses WNL. No clubbing, cyanosis or edema. Lymphatic No adneopathy. No adenopathy. No adenopathy. Musculoskeletal Adexa without tenderness or enlargement.. Digits and nails w/o clubbing, cyanosis, infection, petechiae, ischemia, or inflammatory conditions.. Integumentary (Hair, Skin) No suspicious lesions. No crepitus or fluctuance. No peri-wound warmth or erythema. No masses.Marland Kitchen Psychiatric Judgement and insight Intact.. No evidence of depression, anxiety, or agitation.. Notes the patient  is healthy granulation tissue but besides that there is some redness of the wound and  the discharge showed a little bit of malodor. No evidence of generalized cellulitis or systemic symptoms. Electronic Signature(s) Signed: 03/31/2017 2:25:35 PM By: Christin Fudge MD, FACS Entered By: Christin Fudge on 03/31/2017 14:25:34 Darcey, Honour F. ([id number]) -------------------------------------------------------------------------------- Physician Orders Details Patient Name: Iott, Jahnia F. Date of Service: 03/31/2017 1:30 PM Medical Record Number: [id number] Patient Account Number: [id number] Date of Birth/Sex: September 27, 1924 (83 y.o. Female) Treating RN: Carolyne Fiscal, Debi Primary Care Provider: Halina Maidens Other Clinician: Referring Provider: Halina Maidens Treating Provider/Extender: Frann Rider in Treatment: 44 Verbal / Phone Orders: Yes Clinician: Carolyne Fiscal, Debi Read Back and Verified: Yes Diagnosis Coding Wound Cleansing Wound #1 Left,Lateral Lower Leg o Clean wound with wound cleanser. o Cleanse wound with mild soap and water Anesthetic Wound #1 Left,Lateral Lower Leg o Topical Lidocaine 4% cream applied to wound bed prior to debridement Skin Barriers/Peri-Wound Care Wound #1 Left,Lateral Lower Leg o Barrier cream Primary Wound Dressing Wound #1 Left,Lateral Lower Leg o Hydrafera Blue - please moisten to remove Secondary Dressing Wound #1 Left,Lateral Lower Leg o ABD pad o Drawtex Dressing Change Frequency o Change Dressing Monday, Wednesday, Friday - HHRN to go change dressing Monday, Wednesday and Friday the week of 03/20/17 - 03/24/17 and resume to normal days Monday and Wednesday on the week after Follow-up Appointments Wound #1 Left,Lateral Lower Leg o Return Appointment in 1 week. Edema Control Wound #1 Left,Lateral Lower Leg o 3 Layer Compression System - Left Lower Extremity - Please wrap 3cm from toes and 3cm from knee. unna to anchor Westman, Latanja F. ([id number]) o Elevate legs to the level of the heart and pump ankles as  often as possible Additional Orders / Instructions Wound #1 Left,Lateral Lower Leg o Increase protein intake. Home Health Wound #1 Left,Lateral Lower Leg o Continue [facility] Health Visits - [facility] to change dressing Monday and Wednesday. Please order these supplies for the pt. o Home Health Nurse may visit PRN to address patientos wound care needs. o FACE TO FACE ENCOUNTER: MEDICARE and MEDICAID PATIENTS: I certify that this patient is under my care and that I had a face-to-face encounter that meets the physician face-to-face encounter requirements with this patient on this date. The encounter with the patient was in whole or in part for the following MEDICAL CONDITION: (primary reason for [place]) MEDICAL NECESSITY: I certify, that based on my findings, NURSING services are a medically necessary home health service. HOME BOUND STATUS: I certify that my clinical findings support that this patient is homebound (i.e., Due to illness or injury, pt requires aid of supportive devices such as crutches, cane, wheelchairs, walkers, the use of special transportation or the assistance of another person to leave their place of residence. There is a normal inability to leave the home and doing so requires considerable and taxing effort. Other absences are for medical reasons / religious services and are infrequent or of short duration when for other reasons). o If current dressing causes regression in wound condition, may D/C ordered dressing product/s and apply Normal Saline Moist Dressing daily until next [place] / Other MD appointment. Rose of regression in wound condition at [phone]. o Please direct any NON-WOUND related issues/requests for orders to patient's Primary Care Physician Medications-please add to medication list. Wound #1 Left,Lateral Lower Leg o P.O. Antibiotics - Start Doxycycline o Other: - Vitamin A, Vitamin C, Zinc,  MVI Patient Medications  Allergies: Augmentin, calcium channel blockers, nitrofurantoin, ACE Inhibitors, atorvastatin, Beta-Blockers (Beta-Adrenergic Blocking Agts), Levaquin, pravastatin, Statins-Hmg-Coa Reductase Inhibitors, sulfa, clindamycin, lincomycin, SILVER Notifications Medication Indication Start End doxycycline hyclate 03/31/2017 DOSE oral 100 mg capsule - capsule oral bid Electronic Signature(s) Signed: 03/31/2017 2:23:42 PM By: Christin Fudge MD, FACS [place], Kismet F. ([id number]) Entered By: Christin Fudge on 03/31/2017 14:23:41 Tencza, Anhelica F. ([id number]) -------------------------------------------------------------------------------- Problem List Details Patient Name: Kloehn, Raelin F. Date of Service: 03/31/2017 1:30 PM Medical Record Number: [id number] Patient Account Number: [id number] Date of Birth/Sex: 1924/11/09 (83 y.o. Female) Treating RN: Carolyne Fiscal, Debi Primary Care Provider: Halina Maidens Other Clinician: Referring Provider: Halina Maidens Treating Provider/Extender: Frann Rider in Treatment: 13 Active Problems ICD-10 Encounter Code Description Active Date Diagnosis E11.622 Type 2 diabetes mellitus with other skin ulcer 01/02/2017 Yes L03.116 Cellulitis of left lower limb 01/02/2017 Yes I10 Essential (primary) hypertension 01/02/2017 Yes I89.0 Lymphedema, not elsewhere classified 01/16/2017 Yes Inactive Problems Resolved Problems Electronic Signature(s) Signed: 03/31/2017 2:24:02 PM By: Christin Fudge MD, FACS Entered By: Christin Fudge on 03/31/2017 14:24:02 Blum, Chan F. ([id number]) -------------------------------------------------------------------------------- Progress Note Details Patient Name: Simic, Isidora F. Date of Service: 03/31/2017 1:30 PM Medical Record Number: [id number] Patient Account Number: [id number] Date of Birth/Sex: 1924/08/20 (83 y.o. Female) Treating RN: Carolyne Fiscal, Debi Primary Care Provider: Halina Maidens Other Clinician: Referring  Provider: Halina Maidens Treating Provider/Extender: Frann Rider in Treatment: 13 Subjective Chief Complaint Information obtained from Patient Left lower leg cellulitis History of Present Illness (HPI) 12/30/16 Patient presents today for initial evaluation concerning an area over her left lateral ankle which she tells me has been intermittent in nature since 2000. However the current opening has been present for about two weeks. She has been tolerating the dressing changes at home with antibiotic ointment but that is really the only treatment that has been initiated at this point. That is other than the oral antibiotics which was Keflex that patient was placed on by her primary care provider prior to being referred to [place]. She does have some discomfort but rates this to be around a 2-3 out of 10 fortunately the redness does not seem to be spreading to any other location as far as her lower extremity is concerned. She does tell me that in the past antibiotics have been beneficial for her unfortunately the current antibiotics have not been of benefit and no wound culture was performed as of yet. She has previously seen Dr. 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 She does have diabetes and are most recent blood sugars have run between 104 and 106 according to patient's although her last hemoglobin A1c she is unaware of. Patient's current wound does not appear to be significantly open but is rather more of a weeping region of cellulitis. 01/27/17 on evaluation today patient left lower for many wound continues to show signs of erythema surrounding and in fact she is noted to have erythema from her toes up to just below the knee in regard to left lower extremity. She is not having any fevers, chills, nausea,  vomiting, or diarrhea at this point. With that being said she is concerned about the silver alginate dressing. She tells me that in the past she used silver and some of the dressings for previous one and did not do well with it. Nonetheless she has discomfort  along the wound itself but no other pain noted throughout the left lower extremity. 02/03/17 on evaluation today patient appears to be doing fairly well overall other than the fact that her wound is worsening on the lateral aspect of her left lower extremity. It appears more macerated and even slough covered at this point. With that being said she has been attempting to perform the dressing changes on her own although I'm not sure that she is understanding exactly how this should be done. Nonetheless I do believe the gentamicin may be causing too much maceration she really has not wanted to utilize the silver alginate dressings past due to a previous issue with this. Overall her erythema of the left lower extremity is improved she still has significant swelling. No fevers, chills, nausea, or vomiting noted at this time. 02/10/2017 -- Old notes received -- was seen in 2009 by [facility] by Dr. Romie Levee -- his impression was chronic patches of dermatitis on the left lower extremity and current mild swelling. He recommended venous insufficiency venous reflux studies. She had no evidence of arterial insufficiency. Dorval, Demeka F. ([id number]) Follow-up chronic venous insufficiency study done on 12/16/2007, showed very mild amount of reflux on the great saphenous vein at the knee but the function of the vein is normal and there was no evidence by the venous reflux study.. They referred her to dermatology for a second opinion and would see her back in about 4-6 weeks. She was seen back in follow-up in January 01 2008 and at that time she was asked to use a steroid ointment locally and the wounds are almost completely healed. The patient has told  me today that she does not tolerate silver dressing -- and she has not had home health come and change her dressings over the last week. 02/17/2017 -- - had a lower arterial study done which showed a bilateral ABI suggests no significant lower extremity arterial disease and the toe brachial indicis were normal on the right and abnormal on the left. The right ABI was 1.0 and the left was 1.07. The right toe brachial indices was 0.81 on the right and 0.56 on the left. She had biphasic and triphasic flow at the tibial vessels. 02/24/2017 -- the patient has been tolerating a 3 layer wrap and seems to be pleased with her progress 03/17/17 on evaluation today patient appears to be doing better in regard to her left lateral lower extremity wound. She has been Tolerating the wraps without complication. No fevers, chills, nausea, or vomiting noted at this time. She is pleased with how things are progressing. Early complaint is that she is unable to take a shower for such a long time from Wednesday all the way till the following Monday. 03/31/2017 - the patient and her nurse were concerned about the redness of the wound and the discharge from the wound but other than that everything else has been okay and she has been doing fairly well. She has no constitutional symptoms Objective Constitutional Pulse regular. Respirations normal and unlabored. Afebrile. Vitals Time Taken: 1:47 PM, Height: 63 in, Weight: 164.8 lbs, BMI: 29.2, Temperature: 98.0 F, Pulse: 82 bpm, Respiratory Rate: 16 breaths/min, Blood Pressure: 164/97 mmHg. Eyes Nonicteric. Reactive to light. Ears, Nose, Mouth, and Throat Lips, teeth, and gums WNL.Marland Kitchen Moist mucosa without lesions. Neck supple and nontender. No palpable supraclavicular or cervical adenopathy. Normal sized without goiter. Virgin, Kalaya F. ([id number]) Respiratory WNL. No retractions.. Cardiovascular Pedal Pulses WNL. No clubbing, cyanosis or  edema. Lymphatic No  adneopathy. No adenopathy. No adenopathy. Musculoskeletal Adexa without tenderness or enlargement.. Digits and nails w/o clubbing, cyanosis, infection, petechiae, ischemia, or inflammatory conditions.Marland Kitchen Psychiatric Judgement and insight Intact.. No evidence of depression, anxiety, or agitation.. General Notes: the patient is healthy granulation tissue but besides that there is some redness of the wound and the discharge showed a little bit of malodor. No evidence of generalized cellulitis or systemic symptoms. Integumentary (Hair, Skin) No suspicious lesions. No crepitus or fluctuance. No peri-wound warmth or erythema. No masses.. Wound #1 status is Open. Original cause of wound was Gradually Appeared. The wound is located on the Left,Lateral Lower Leg. The wound measures 8cm length x 6.5cm width x 0.1cm depth; 40.841cm^2 area and 4.084cm^3 volume. There is Fat Layer (Subcutaneous Tissue) Exposed exposed. There is no tunneling or undermining noted. There is a large amount of serosanguineous drainage noted. The wound margin is flat and intact. There is large (67-100%) granulation within the wound bed. There is a small (1-33%) amount of necrotic tissue within the wound bed including Adherent Slough. The periwound skin appearance exhibited: Excoriation, Maceration, Erythema. The periwound skin appearance did not exhibit: Callus, Crepitus, Induration, Rash, Scarring, Dry/Scaly, Atrophie Blanche, Cyanosis, Ecchymosis, Hemosiderin Staining, Mottled, Pallor, Rubor. The surrounding wound skin color is noted with erythema which is circumferential. Periwound temperature was noted as No Abnormality. The periwound has tenderness on palpation. Assessment Active Problems ICD-10 E11.622 - Type 2 diabetes mellitus with other skin ulcer L03.116 - Cellulitis of left lower limb I10 - Essential (primary) hypertension I89.0 - Lymphedema, not elsewhere classified Dokken, Naelle F. ([id number]) Plan Wound  Cleansing: Wound #1 Left,Lateral Lower Leg: Clean wound with wound cleanser. Cleanse wound with mild soap and water Anesthetic: Wound #1 Left,Lateral Lower Leg: Topical Lidocaine 4% cream applied to wound bed prior to debridement Skin Barriers/Peri-Wound Care: Wound #1 Left,Lateral Lower Leg: Barrier cream Primary Wound Dressing: Wound #1 Left,Lateral Lower Leg: Hydrafera Blue - please moisten to remove Secondary Dressing: Wound #1 Left,Lateral Lower Leg: ABD pad Drawtex Dressing Change Frequency: Change Dressing Monday, Wednesday, Friday - HHRN to go change dressing Monday, Wednesday and Friday the week of 03/20/17 - 03/24/17 and resume to normal days Monday and Wednesday on the week after Follow-up Appointments: Wound #1 Left,Lateral Lower Leg: Return Appointment in 1 week. Edema Control: Wound #1 Left,Lateral Lower Leg: 3 Layer Compression System - Left Lower Extremity - Please wrap 3cm from toes and 3cm from knee. unna to anchor Elevate legs to the level of the heart and pump ankles as often as possible Additional Orders / Instructions: Wound #1 Left,Lateral Lower Leg: Increase protein intake. Home Health: Wound #1 Left,Lateral Lower Leg: Continue Home Health Visits - HHRN to change dressing Monday and Wednesday. Please order these supplies for the pt. Home Health Nurse may visit PRN to address patient s wound care needs. FACE TO FACE ENCOUNTER: MEDICARE and MEDICAID PATIENTS: I certify that this patient is under my care and that I had a face-to-face encounter that meets the physician face-to-face encounter requirements with this patient on this date. The encounter with the patient was in whole or in part for the following MEDICAL CONDITION: (primary reason for [place]) MEDICAL NECESSITY: I certify, that based on my findings, NURSING services are a medically necessary home health service. HOME BOUND STATUS: I certify that my clinical findings support that this  patient is homebound (i.e., Due to [place], Dailee F. ([id number]) illness or injury, pt requires aid of supportive devices such as  crutches, cane, wheelchairs, walkers, the use of special transportation or the assistance of another person to leave their place of residence. There is a normal inability to leave the home and doing so requires considerable and taxing effort. Other absences are for medical reasons / religious services and are infrequent or of short duration when for other reasons). If current dressing causes regression in wound condition, may D/C ordered dressing product/s and apply Normal Saline Moist Dressing daily until next [place] / Other MD appointment. [place] of regression in wound condition at [phone]. Please direct any NON-WOUND related issues/requests for orders to patient's Primary Care Physician Medications-please add to medication list.: Wound #1 Left,Lateral Lower Leg: P.O. Antibiotics - Start Doxycycline Other: - Vitamin A, Vitamin C, Zinc, MVI The following medication(s) was prescribed: doxycycline hyclate oral 100 mg capsule capsule oral bid starting 03/31/2017 there was some concern about the drainage and color of the wound but after review I do not find any evidence of gross cellulitis but I shall err or on the side of treating her with some doxycycline. I have reviewed details of this with her and recommended: 1. Hydrofera blue, drawtext and a 3 layer Profore wrap to be changed 3 times a week. 2. Elevation and exercise has been discussed with her 3. She will return to see [place] next week for a review. I have discussed at length with her that once her wounds have healed he will try and get her into compression stockings or compression wraps for a long-term basis. If her wounds however don't respond well she may need a venous duplex reflux study at a later date. Electronic Signature(s) Signed: 03/31/2017 2:27:18 PM By: Christin Fudge MD, FACS Entered By: Christin Fudge on 03/31/2017 14:27:18 Gerken, Makira F. ([id number]) -------------------------------------------------------------------------------- SuperBill Details Patient Name: Crisanti, Breuna F. Date of Service: 03/31/2017 Medical Record Number: [id number] Patient Account Number: [id number] Date of Birth/Sex: [birth year] (83 y.o. Female) Treating RN: Carolyne Fiscal, Debi Primary Care Provider: Halina Maidens Other Clinician: Referring Provider: Halina Maidens Treating Provider/Extender: Frann Rider in Treatment: 13 Diagnosis Coding ICD-10 Codes Code Description E11.622 Type 2 diabetes mellitus with other skin ulcer L03.116 Cellulitis of left lower limb I10 Essential (primary) hypertension I89.0 Lymphedema, not elsewhere classified Facility Procedures CPT4: Description Modifier Quantity Code [id number] (Facility Use Only) [phone] - APPLY [place] [facility] 1 LEG Physician Procedures CPT4 Code: [id number] Description: 00867 - WC PHYS LEVEL 3 - EST PT ICD-10 Description Diagnosis E11.622 Type 2 diabetes mellitus with other skin ulcer L03.116 Cellulitis of left lower limb I10 Essential (primary) hypertension I89.0 Lymphedema, not elsewhere classified Modifier: Quantity: 1 Electronic Signature(s) Signed: 03/31/2017 4:03:52 PM By: Alric Quan Signed: 03/31/2017 4:18:43 PM By: Christin Fudge MD, FACS Previous Signature: 03/31/2017 2:27:42 PM Version By: Christin Fudge MD, FACS Previous Signature: 03/31/2017 2:27:32 PM Version By: Christin Fudge MD, FACS Entered By: Alric Quan on 03/31/2017 14:41:10

## 2017-04-07 ENCOUNTER — Encounter: Payer: Medicare PPO | Admitting: Surgery

## 2017-04-07 DIAGNOSIS — E11622 Type 2 diabetes mellitus with other skin ulcer: Secondary | ICD-10-CM | POA: Diagnosis not present

## 2017-04-10 NOTE — Progress Notes (Signed)
[name] [name] F. ([id number]) Visit Report for 04/07/2017 Arrival Information Details [name] [name] F. Date of Service: 04/07/2017 1:30 PM Medical Record Number: [id number] Patient Account Number: [id number] Date of Birth/Sex: 05-08-1925 (83 y.o. Female) Treating RN: Carolyne Fiscal, Debi Primary Care Rhonda Vangieson: Halina Maidens Other Clinician: Referring Debie Ashline: Halina Maidens Treating Kinlie Janice/Extender: Frann Rider in Treatment: 14 Visit Information History Since Last Visit All ordered tests and consults were completed: No Patient Arrived: [name] [name] Added or deleted any medications: No Arrival Time: 13:44 Any new allergies or adverse reactions: No Accompanied By: friend Had a fall or experienced change in No Transfer Assistance: EasyPivot Patient activities of daily living that may affect Lift risk of falls: Patient Identification Verified: Yes Signs or symptoms of abuse/neglect since last visito No Secondary Verification Process Yes Hospitalized since last visit: No Completed: Has Dressing in Place as Prescribed: Yes Patient Requires Transmission-Based No Precautions: Has Compression in Place as Prescribed: Yes Patient Has Alerts: Yes Pain Present Now: No Patient Alerts: DM II Electronic Signature(s) Signed: 04/07/2017 4:20:42 PM By: Alric Quan Entered By: Alric Quan on 04/07/2017 13:44:53 [name] [name] F. ([id number]) -------------------------------------------------------------------------------- Encounter Discharge Information Details [name] [name] F. Date of Service: 04/07/2017 1:30 PM Medical Record Number: [id number] Patient Account Number: [id number] Date of Birth/Sex: 08-24-1924 (83 y.o. Female) Treating RN: Carolyne Fiscal, Debi Primary Care Jaylenn Altier: Halina Maidens Other Clinician: Referring Razia Screws: Halina Maidens Treating Ramiel Forti/Extender: Frann Rider in Treatment: 14 Encounter Discharge Information Items Discharge Pain Level:  0 Discharge Condition: Stable Ambulatory Status: Cane Discharge Destination: Home Transportation: Private Auto Accompanied By: friend Schedule Follow-up Appointment: Yes Medication Reconciliation completed and No provided to Patient/Care [name] [name]: Provided on Clinical Summary of Care: 04/07/2017 Form Type Recipient Paper Patient [facility] Electronic Signature(s) Signed: 04/10/2017 9:18:03 AM By: Ruthine Dose Previous Signature: 04/07/2017 2:03:42 PM Version By: Alric Quan Entered By: Ruthine Dose on 04/07/2017 14:28:33 [name] [name] F. ([id number]) -------------------------------------------------------------------------------- Lower Extremity Assessment Details [name] [name] F. Date of Service: 04/07/2017 1:30 PM Medical Record Number: [id number] Patient Account Number: [id number] Date of Birth/Sex: 06-25-1924 (83 y.o. Female) Treating RN: Carolyne Fiscal, Debi Primary Care Dellar Traber: Halina Maidens Other Clinician: Referring Elyan Vanwieren: Halina Maidens Treating Ailany Koren/Extender: Frann Rider in Treatment: 14 Edema Assessment Assessed: Shirlyn Goltz: No] Patrice Paradise: No] [Left: Edema] [Right: :] Calf Left: Right: Point of Measurement: 27 cm From Medial Instep 29.2 cm cm Ankle Left: Right: Point of Measurement: 10 cm From Medial Instep 22 cm cm Vascular Assessment Pulses: Dorsalis Pedis Palpable: [Left:Yes] Posterior Tibial Extremity colors, hair growth, and conditions: Extremity Color: [Left:Red] Temperature of Extremity: [Left:Warm] Capillary Refill: [Left:< 3 seconds] Electronic Signature(s) Signed: 04/07/2017 4:20:42 PM By: Alric Quan Entered By: Alric Quan on 04/07/2017 13:58:43 [name] [name] F. ([id number]) -------------------------------------------------------------------------------- Multi Wound Chart Details [name] [name] F. Date of Service: 04/07/2017 1:30 PM Medical Record Number: [id number] Patient Account Number: [id number] Date of  Birth/Sex: 09/10/1924 (83 y.o. Female) Treating RN: Carolyne Fiscal, Debi Primary Care Keli Buehner: Halina Maidens Other Clinician: Referring Amiera Herzberg: Halina Maidens Treating Minami Arriaga/Extender: Frann Rider in Treatment: 14 Vital Signs Height(in): 63 Pulse(bpm): 34 Weight(lbs): 164.8 Blood Pressure(mmHg): 151/73 Body Mass Index(BMI): 29 Temperature(F): 97.9 Respiratory Rate 16 (breaths/min): Photos: [1:No Photos] [N/A:N/A] Wound Location: [1:Left Lower Leg - Lateral] [N/A:N/A] Wounding Event: [1:Gradually Appeared] [N/A:N/A] Primary Etiology: [1:Diabetic Wound/Ulcer of the Lower Extremity] [N/A:N/A] Comorbid History: [1:Type II Diabetes] [N/A:N/A] Date Acquired: [1:12/16/2016] [N/A:N/A] Weeks of Treatment: [1:14] [N/A:N/A] Wound Status: [1:Open] [N/A:N/A] Measurements L x W x D [1:4.5x2.5x0.1] [N/A:N/A] (cm) Area (cm) : [1:8.836] [  N/A:N/A] Volume (cm) : [1:0.884] [N/A:N/A] % Reduction in Area: [1:-799.80%] [N/A:N/A] % Reduction in Volume: [1:-802.00%] [N/A:N/A] Classification: [1:Grade 1] [N/A:N/A] Exudate Amount: [1:Large] [N/A:N/A] Exudate Type: [1:Serosanguineous] [N/A:N/A] Exudate Color: [1:red, brown] [N/A:N/A] Wound Margin: [1:Flat and Intact] [N/A:N/A] Granulation Amount: [1:Large (67-100%)] [N/A:N/A] Necrotic Amount: [1:Small (1-33%)] [N/A:N/A] Exposed Structures: [1:Fat Layer (Subcutaneous Tissue) Exposed: Yes Fascia: No Tendon: No Muscle: No Joint: No Bone: No] [N/A:N/A] Epithelialization: [1:Small (1-33%)] [N/A:N/A] Periwound Skin Texture: [1:Excoriation: Yes Induration: No Callus: No Crepitus: No Rash: No Scarring: No] [N/A:N/A] Periwound Skin Moisture: [N/A:N/A] Maceration: Yes Dry/Scaly: No Periwound Skin Color: Erythema: Yes N/A N/A Atrophie Blanche: No Cyanosis: No Ecchymosis: No Hemosiderin Staining: No Mottled: No Pallor: No Rubor: No Erythema Location: Circumferential N/A N/A Temperature: No Abnormality N/A N/A Tenderness on Palpation:  Yes N/A N/A Wound Preparation: Ulcer Cleansing: N/A N/A Rinsed/Irrigated with Saline, Other: soap and water Topical Anesthetic Applied: Other: lidocaine 4% Treatment Notes Electronic Signature(s) Signed: 04/07/2017 2:14:17 PM By: Christin Fudge MD, FACS Entered By: Christin Fudge on 04/07/2017 14:14:16 [name] [name] F. ([id number]) -------------------------------------------------------------------------------- [place] Details Patient Name: Azzie [name] [name] F. Date of Service: 04/07/2017 1:30 PM Medical Record Number: [id number] Patient Account Number: [id number] Date of Birth/Sex: [birth year] (83 y.o. Female) Treating RN: Carolyne Fiscal, Debi Primary Care Zaidyn Claire: Halina Maidens Other Clinician: Referring Amaryllis Malmquist: Halina Maidens Treating Marisa Hufstetler/Extender: Frann Rider in Treatment: 14 Active Inactive ` Abuse / Safety / Falls / Self Care Management Nursing Diagnoses: Potential for falls Goals: Patient will not experience any injury related to falls Date Initiated: 12/30/2016 Target Resolution Date: 04/29/2017 Goal Status: Active Interventions: Assess Activities of Daily Living upon admission and as needed Assess: immobility, friction, shearing, incontinence upon admission and as needed Notes: ` Nutrition Nursing Diagnoses: Imbalanced nutrition Impaired glucose control: actual or potential Potential for alteratiion in Nutrition/Potential for imbalanced nutrition Goals: Patient/caregiver will maintain therapeutic glucose control Date Initiated: 12/30/2016 Target Resolution Date: 04/29/2017 Goal Status: Active Interventions: Assess patient nutrition upon admission and as needed per policy Notes: ` Orientation to the Wound Care Program Nursing Diagnoses: Knowledge deficit related to the wound healing center program Goals: Patient/caregiver will verbalize understanding of the [facility] Date Initiated: 12/30/2016 Target Resolution Date:  01/21/2017 Coke, Teagan Wanda Plump ([id number]) Goal Status: Active Interventions: Provide education on orientation to the wound center Notes: ` Pain, Acute or Chronic Nursing Diagnoses: Pain, acute or chronic: actual or potential Potential alteration in comfort, pain Goals: Patient/caregiver will verbalize adequate pain control between visits Date Initiated: 12/30/2016 Target Resolution Date: 04/29/2017 Goal Status: Active Interventions: Complete pain assessment as per visit requirements Notes: ` Wound/Skin Impairment Nursing Diagnoses: Impaired tissue integrity Knowledge deficit related to smoking impact on wound healing Knowledge deficit related to ulceration/compromised skin integrity Goals: Ulcer/skin breakdown will have a volume reduction of 80% by week 12 Date Initiated: 12/30/2016 Target Resolution Date: 04/22/2017 Goal Status: Active Interventions: Assess patient/caregiver ability to perform ulcer/skin care regimen upon admission and as needed Notes: Electronic Signature(s) Signed: 04/07/2017 4:20:42 PM By: Alric Quan Entered By: Alric Quan on 04/07/2017 13:58:48 [name] [name] F. ([id number]) -------------------------------------------------------------------------------- Pain Assessment Details [name] [name] F. Date of Service: 04/07/2017 1:30 PM Medical Record Number: [id number] Patient Account Number: [id number] Date of Birth/Sex: 05/09/1925 (83 y.o. Female) Treating RN: Ahmed Prima Primary Care Salman Wellen: Halina Maidens Other Clinician: Referring Yuvraj Pfeifer: Halina Maidens Treating Maurie Olesen/Extender: Frann Rider in Treatment: 14 Active Problems Location of Pain Severity and Description of Pain Patient Has Paino No Site Locations Pain Management and Medication Current  Pain Management: Electronic Signature(s) Signed: 04/07/2017 4:20:42 PM By: Alric Quan Entered By: Alric Quan on 04/07/2017 13:44:58 [name] [name] F.  ([id number]) -------------------------------------------------------------------------------- Patient/Caregiver Education Details Patient Name: Azzie [name] [name] F. Date of Service: 04/07/2017 1:30 PM Medical Record Number: [id number] Patient Account Number: [id number] Date of Birth/Gender: [birth year] (83 y.o. Female) Treating RN: Ahmed Prima Primary Care Physician: Halina Maidens Other Clinician: Referring Physician: Halina Maidens Treating Physician/Extender: Frann Rider in Treatment: 14 Education Assessment Education Provided To: Patient Education Topics Provided Wound/Skin Impairment: Handouts: Other: do not get wrap wet Methods: Demonstration, Explain/Verbal Responses: State content correctly Electronic Signature(s) Signed: 04/07/2017 4:20:42 PM By: Alric Quan Entered By: Alric Quan on 04/07/2017 14:03:58 [name] [name] F. ([id number]) -------------------------------------------------------------------------------- Wound Assessment Details [name] [name] F. Date of Service: 04/07/2017 1:30 PM Medical Record Number: [id number] Patient Account Number: [id number] Date of Birth/Sex: 1924-12-20 (83 y.o. Female) Treating RN: Carolyne Fiscal, Debi Primary Care Shandra Szymborski: Halina Maidens Other Clinician: Referring Terrika Zuver: Halina Maidens Treating Emri Sample/Extender: Frann Rider in Treatment: 14 Wound Status Wound Number: 1 Primary Etiology: Diabetic Wound/Ulcer of the Lower Extremity Wound Location: Left Lower Leg - Lateral Wound Status: Open Wounding Event: Gradually Appeared Comorbid Type II Diabetes Date Acquired: 12/16/2016 History: Weeks Of Treatment: 14 Clustered Wound: No Photos Photo Uploaded By: Alric Quan on 04/07/2017 16:10:08 Wound Measurements Length: (cm) 4.5 Width: (cm) 2.5 Depth: (cm) 0.1 Area: (cm) 8.836 Volume: (cm) 0.884 % Reduction in Area: -799.8% % Reduction in Volume: -802% Epithelialization: Small  (1-33%) Tunneling: No Undermining: No Wound Description Classification: Grade 1 Wound Margin: Flat and Intact Exudate Amount: Large Exudate Type: Serosanguineous Exudate Color: red, brown Foul Odor After Cleansing: No Slough/Fibrino Yes Wound Bed Granulation Amount: Large (67-100%) Exposed Structure Necrotic Amount: Small (1-33%) Fascia Exposed: No Necrotic Quality: Adherent Slough Fat Layer (Subcutaneous Tissue) Exposed: Yes Tendon Exposed: No Muscle Exposed: No Joint Exposed: No Bone Exposed: No Periwound Skin Texture Beets, [name] [name] F. ([id number]) Texture Color No Abnormalities Noted: No No Abnormalities Noted: No Callus: No Atrophie Blanche: No Crepitus: No Cyanosis: No Excoriation: Yes Ecchymosis: No Induration: No Erythema: Yes Rash: No Erythema Location: Circumferential Scarring: No Hemosiderin Staining: No Mottled: No Moisture Pallor: No No Abnormalities Noted: No Rubor: No Dry / Scaly: No Maceration: Yes Temperature / Pain Temperature: No Abnormality Tenderness on Palpation: Yes Wound Preparation Ulcer Cleansing: Rinsed/Irrigated with Saline, Other: soap and water, Topical Anesthetic Applied: Other: lidocaine 4%, Treatment Notes Wound #1 (Left, Lateral Lower Leg) 1. Cleansed with: Clean wound with Normal Saline Cleanse wound with antibacterial soap and water 2. Anesthetic Topical Lidocaine 4% cream to wound bed prior to debridement 3. Peri-wound Care: Barrier cream 4. Dressing Applied: Hydrafera Blue 5. Secondary Dressing Applied ABD Pad 7. Secured with Tape 3 Layer Compression System - Left Lower Extremity Notes unna to anchor, drawtex Electronic Signature(s) Signed: 04/07/2017 4:20:42 PM By: Alric Quan Entered By: Alric Quan on 04/07/2017 13:56:47 Sitzman, Denise F. ([id number]) -------------------------------------------------------------------------------- Vitals Details Patient Name: Waszak, Rosaland F. Date of Service: 04/07/2017  1:30 PM Medical Record Number: [id number] Patient Account Number: [id number] Date of Birth/Sex: [birth year] (83 y.o. Female) Treating RN: Carolyne Fiscal, Debi Primary Care Yalitza Teed: Halina Maidens Other Clinician: Referring Innocence Schlotzhauer: Halina Maidens Treating Rhythm Wigfall/Extender: Frann Rider in Treatment: 14 Vital Signs Time Taken: 13:44 Temperature (F): 97.9 Height (in): 63 Pulse (bpm): 68 Weight (lbs): 164.8 Respiratory Rate (breaths/min): 16 Body Mass Index (BMI): 29.2 Blood Pressure (mmHg): 151/73 Reference Range: 80 - 120 mg / dl Electronic Signature(s) Signed: 04/07/2017 4:20:42 PM By: Alric Quan  Entered By: Alric Quan on 04/07/2017 13:48:11

## 2017-04-10 NOTE — Progress Notes (Signed)
Laura [name], [name] ([id number]) Visit Report for 04/07/2017 Chief Complaint Document Details Patient Name: [name], [name] Date of Service: 04/07/2017 1:30 PM Medical Record Number: [id number] Patient Account Number: [id number] Date of Birth/Sex: 04/17/1925 (83 y.o. Female) Treating RN: Laura [name] Primary Care Provider: Halina [name] Other Clinician: Referring Provider: Halina [name] Treating Provider/Extender: Laura [name]: 14 Information Obtained from: Patient Chief Complaint Left lower leg cellulitis Electronic Signature(s) Signed: 04/07/2017 2:14:25 PM By: Laura Fudge MD, FACS Entered By: Laura [name] on 04/07/2017 14:14:25 Galati, [name] ([id number]) -------------------------------------------------------------------------------- HPI Details Patient Name: Laura [name], [name] Date of Service: 04/07/2017 1:30 PM Medical Record Number: [id number] Patient Account Number: [id number] Date of Birth/Sex: 07/19/1924 (83 y.o. Female) Treating RN: Carolyne Fiscal, Debi Primary Care Provider: Halina [name] Other Clinician: Referring Provider: Halina [name] Treating Provider/Extender: Laura [name]: 14 History of Present Illness HPI Description: 12/30/16 Patient presents today for initial evaluation concerning an area over her left lateral ankle which she tells me has been intermittent in nature since 2000. However the current opening has been present for about two weeks. She has been tolerating the dressing changes at home with antibiotic ointment but that is really the only [name] that has been initiated at this point. That is other than the oral antibiotics which was Keflex that patient was placed on by her primary care provider prior to being referred to [place]. She does have some discomfort but rates this to be around a 2-3 out of 10 fortunately the redness does not seem to be spreading to any other location as far as her lower extremity is concerned. She does  tell me that in the past antibiotics have been beneficial for her unfortunately the current antibiotics have not been of benefit and no wound culture was performed as of yet. She has previously seen Dr. 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 She does have diabetes and are most recent blood sugars have run between 104 and 106 according to patient's although her last hemoglobin A1c she is unaware of. Patient's current wound does not appear to be significantly open but is rather more of a weeping region of cellulitis. 01/27/17 on evaluation today patient left lower for many wound continues to show signs of erythema surrounding and in fact she is noted to have erythema from her toes up to just below the knee in regard to left lower extremity. She is not having any fevers, chills, nausea, vomiting, or diarrhea at this point. With that being said she is concerned about the silver alginate dressing. She tells me that in the past she used silver and some of the dressings for previous one and did not do well with it. Nonetheless she has discomfort along the wound itself but no other pain noted throughout the left lower extremity. 02/03/17 on evaluation today patient appears to be doing fairly well overall other than the fact that her wound is worsening on the lateral aspect of her left lower extremity. It appears more macerated and even slough covered at this point. With that being said she has been attempting to perform the dressing changes on her own although I'm not sure that she is understanding exactly how this should be done. Nonetheless I do believe the gentamicin may be causing too much maceration she really has not wanted to utilize  the silver alginate dressings past due to a previous issue with this. Overall her erythema  of the left lower extremity is improved she still has significant swelling. No fevers, chills, nausea, or vomiting noted at this time. 02/10/2017 -- Old notes received -- was seen in 2009 by [facility] by Laura [name] -- his impression was chronic patches of dermatitis on the left lower extremity and current mild swelling. He recommended venous insufficiency venous reflux studies. She had no evidence of arterial insufficiency. Follow-up chronic venous insufficiency study done on 12/16/2007, showed very mild amount of reflux on the great saphenous vein at the knee but the function of the vein is normal and there was no evidence by the venous reflux study.. They referred her to dermatology for a second opinion and would see her back in about 4-6 weeks. She was seen back in follow-up in January 01 2008 and at that time she was asked to use a steroid ointment locally and the wounds are almost completely healed. The patient has told me today that she does not tolerate silver dressing -- and she has not had home health come and change her dressings over the last week. 02/17/2017 -- - had a lower arterial study done which showed a bilateral ABI suggests no significant lower extremity arterial disease and the toe brachial indicis were normal on the right and abnormal on the left. The right ABI was 1.0 and the left was 1.07. The right toe brachial indices was 0.81 on the right and 0.56 on the left. She had biphasic and triphasic flow at the tibial vessels. 02/24/2017 -- the patient has been tolerating a 3 layer wrap and seems to be pleased with her progress 03/17/17 on evaluation today patient appears to be doing better in regard to her left lateral lower extremity wound. She has been Tolerating the wraps without complication. No fevers, chills, nausea, or vomiting noted at this time. She is pleased with [place], [name] ([id number]) how things are progressing. Early complaint is that she is unable to take  a shower for such a long time from Wednesday all the way till the following Monday. 03/31/2017 - the patient and her nurse were concerned about the redness of the wound and the discharge from the wound but other than that everything else has been okay and she has been doing fairly well. She has no constitutional symptoms Electronic Signature(s) Signed: 04/07/2017 2:14:54 PM By: Laura Fudge MD, FACS Entered By: Laura [name] on 04/07/2017 14:14:54 Laura [name], [name] ([id number]) -------------------------------------------------------------------------------- Physical Exam Details Patient Name: Laura [name], [name] Date of Service: 04/07/2017 1:30 PM Medical Record Number: [id number] Patient Account Number: [id number] Date of Birth/Sex: [birth year] (83 y.o. Female) Treating RN: Laura [name] Primary Care Provider: Halina [name] Other Clinician: Referring Provider: Halina [name] Treating Provider/Extender: Laura [name]: 14 Constitutional . Pulse regular. Respirations normal and unlabored. Afebrile. . Eyes Nonicteric. Reactive to light. Ears, Nose, Mouth, and Throat Lips, teeth, and gums WNL.Marland Kitchen Moist mucosa without lesions. Neck supple and nontender. No palpable supraclavicular or cervical adenopathy. Normal sized without goiter. Respiratory WNL. No retractions.. Cardiovascular Pedal Pulses WNL. No clubbing, cyanosis or edema. Lymphatic No adneopathy. No adenopathy. No adenopathy. Musculoskeletal Adexa without tenderness or enlargement.. Digits and nails w/o clubbing, cyanosis, infection, petechiae, ischemia, or inflammatory conditions.. Integumentary (Hair, Skin) No suspicious lesions. No crepitus or fluctuance. No peri-wound warmth or erythema. No masses.Marland Kitchen Psychiatric Judgement and insight Intact.. No evidence of depression, anxiety, or agitation.. Notes the left  lower extremity lymphedema is well controlled and the wound is looking clean with healthy granulation  tissue. No debridement was required today. Electronic Signature(s) Signed: 04/07/2017 2:15:29 PM By: Laura Fudge MD, FACS Entered By: Laura [name] on 04/07/2017 14:15:29 Laura [name], [name] ([id number]) -------------------------------------------------------------------------------- Physician Orders Details Patient Name: Laura [name], [name] Date of Service: 04/07/2017 1:30 PM Medical Record Number: [id number] Patient Account Number: [id number] Date of Birth/Sex: 1925/03/04 (83 y.o. Female) Treating RN: Carolyne Fiscal, Debi Primary Care Provider: Halina [name] Other Clinician: Referring Provider: Halina [name] Treating Provider/Extender: Laura [name]: 14 Verbal / Phone Orders: Yes Clinician: Carolyne Fiscal, Debi Read Back and Verified: Yes Diagnosis Coding Wound Cleansing Wound #1 Left,Lateral Lower Leg o Clean wound with wound cleanser. o Cleanse wound with mild soap and water Anesthetic Wound #1 Left,Lateral Lower Leg o Topical Lidocaine 4% cream applied to wound bed prior to debridement Skin Barriers/Peri-Wound Care Wound #1 Left,Lateral Lower Leg o Barrier cream Primary Wound Dressing Wound #1 Left,Lateral Lower Leg o Hydrafera Blue - (Ready Transfer) (ONLY) please moisten to remove Secondary Dressing Wound #1 Left,Lateral Lower Leg o ABD pad o Drawtex Dressing Change Frequency o Change Dressing Monday, Wednesday, Friday - HHRN to go change dressing Monday, Wednesday and Friday the week of 03/20/17 - 03/24/17 and resume to normal days Monday and Wednesday on the week after Follow-up Appointments Wound #1 Left,Lateral Lower Leg o Return Appointment in 1 week. Edema Control Wound #1 Left,Lateral Lower Leg o 3 Layer Compression System - Left Lower Extremity - Please wrap 3cm from toes and 3cm from knee. unna to anchor o Elevate legs to the level of the heart and pump ankles as often as possible Additional Orders / Instructions Wound #1  Left,Lateral Lower Leg o Increase protein intake. Laura [name], [name] ([id number]) Home Health Wound #1 Left,Lateral Lower Leg o [place] Visits - HHRN to change dressing Monday and Wednesday. Please order these supplies for the pt. o Home Health Nurse may visit PRN to address patientos wound care needs. o FACE TO FACE ENCOUNTER: MEDICARE and MEDICAID PATIENTS: I certify that this patient is under my care and that I had a face-to-face encounter that meets the physician face-to-face encounter requirements with this patient on this date. The encounter with the patient was in whole or in part for the following MEDICAL CONDITION: (primary reason for [place]) MEDICAL NECESSITY: I certify, that based on my findings, NURSING services are a medically necessary home health service. HOME BOUND STATUS: I certify that my clinical findings support that this patient is homebound (i.e., Due to illness or injury, pt requires aid of supportive devices such as crutches, cane, wheelchairs, walkers, the use of special transportation or the assistance of another person to leave their place of residence. There is a normal inability to leave the home and doing so requires considerable and taxing effort. Other absences are for medical reasons / religious services and are infrequent or of short duration when for other reasons). o If current dressing causes regression in wound condition, may D/C ordered dressing product/s and apply Normal Saline Moist Dressing daily until next [place] / Other MD appointment. [place] of regression in wound condition at [phone]. o Please direct any NON-WOUND related issues/requests for orders to patient's Primary Care Physician Medications-please add to medication list. Wound #1 Left,Lateral Lower Leg o P.O. Antibiotics - Continue your Doxycycline o Other: - Vitamin A, Vitamin C, Zinc, MVI Electronic  Signature(s) Signed: 04/07/2017 4:07:58 PM By: Con Memos,  Roderick Pee MD, FACS Signed: 04/07/2017 4:20:42 PM By: Alric Quan Entered By: Alric Quan on 04/07/2017 14:07:22 Laura [name], [name] ([id number]) -------------------------------------------------------------------------------- Problem List Details Patient Name: Laura [name], [name] Date of Service: 04/07/2017 1:30 PM Medical Record Number: [id number] Patient Account Number: [id number] Date of Birth/Sex: 1924/10/30 (83 y.o. Female) Treating RN: Carolyne Fiscal, Debi Primary Care Provider: Halina [name] Other Clinician: Referring Provider: Halina [name] Treating Provider/Extender: Laura [name]: 14 Active Problems ICD-10 Encounter Code Description Active Date Diagnosis E11.622 Type 2 diabetes mellitus with other skin ulcer 01/02/2017 Yes I10 Essential (primary) hypertension 01/02/2017 Yes I89.0 Lymphedema, not elsewhere classified 01/16/2017 Yes Inactive Problems Resolved Problems ICD-10 Code Description Active Date Resolved Date L03.116 Cellulitis of left lower limb 01/02/2017 01/02/2017 Electronic Signature(s) Signed: 04/07/2017 2:14:11 PM By: Laura Fudge MD, FACS Entered By: Laura [name] on 04/07/2017 14:14:11 [place], Taraji F. ([id number]) -------------------------------------------------------------------------------- Progress Note Details Patient Name: Hamza, Nahomy F. Date of Service: 04/07/2017 1:30 PM Medical Record Number: [id number] Patient Account Number: [id number] Date of Birth/Sex: 19-Sep-1924 (83 y.o. Female) Treating RN: Carolyne Fiscal, Debi Primary Care Provider: Halina [name] Other Clinician: Referring Provider: Halina [name] Treating Provider/Extender: Laura [name]: 14 Subjective Chief Complaint Information obtained from Patient Left lower leg cellulitis History of Present Illness (HPI) 12/30/16 Patient presents today for initial evaluation concerning an area over her left lateral ankle  which she tells me has been intermittent in nature since 2000. However the current opening has been present for about two weeks. She has been tolerating the dressing changes at home with antibiotic ointment but that is really the only [name] that has been initiated at this point. That is other than the oral antibiotics which was Keflex that patient was placed on by her primary care provider prior to being referred to [place]. She does have some discomfort but rates this to be around a 2-3 out of 10 fortunately the redness does not seem to be spreading to any other location as far as her lower extremity is concerned. She does tell me that in the past antibiotics have been beneficial for her unfortunately the current antibiotics have not been of benefit and no wound culture was performed as of yet. She has previously seen Dr. 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 She does have diabetes and are most recent blood sugars have run between 104 and 106 according to patient's although her last hemoglobin A1c she is unaware of. Patient's current wound does not appear to be significantly open but is rather more of a weeping region of cellulitis. 01/27/17 on evaluation today patient left lower for many wound continues to show signs of erythema surrounding and in fact she is noted to have erythema from her toes up to just below the knee in regard to left lower extremity. She is not having any fevers, chills, nausea, vomiting, or diarrhea at this point. With that being said she is concerned about the silver alginate dressing. She tells me that in the past she used silver and some of the dressings for previous one and did not do well with it. Nonetheless she has discomfort along the wound itself but no other pain noted throughout the left  lower extremity. 02/03/17 on evaluation today patient appears to be doing fairly well overall other than the fact that her wound is worsening on the  lateral aspect of her left lower extremity. It appears more macerated and even slough covered at this point. With that being said she has been attempting to perform the dressing changes on her own although I'm not sure that she is understanding exactly how this should be done. Nonetheless I do believe the gentamicin may be causing too much maceration she really has not wanted to utilize the silver alginate dressings past due to a previous issue with this. Overall her erythema of the left lower extremity is improved she still has significant swelling. No fevers, chills, nausea, or vomiting noted at this time. 02/10/2017 -- Old notes received -- was seen in 2009 by [facility] by Laura [name] -- his impression was chronic patches of dermatitis on the left lower extremity and current mild swelling. He recommended venous insufficiency venous reflux studies. She had no evidence of arterial insufficiency. Follow-up chronic venous insufficiency study done on 12/16/2007, showed very mild amount of reflux on the great saphenous vein at the knee but the function of the vein is normal and there was no evidence by the venous reflux study.. They referred her to dermatology for a second opinion and would see her back in about 4-6 weeks. She was seen back in follow-up in January 01 2008 and at that time she was asked to use a steroid ointment locally and the wounds are almost completely healed. The patient has told me today that she does not tolerate silver dressing -- and she has not had home health come and change her dressings over the last week. 02/17/2017 -- - had a lower arterial study done which showed a bilateral ABI suggests no significant lower extremity arterial disease and the toe brachial indicis were normal on the right and abnormal on the left. The  right ABI was 1.0 and the left was 1.07. The right toe brachial indices was 0.81 on the right and 0.56 on the left. She had biphasic and triphasic flow at the tibial Laura [name], [name] vessels. 02/24/2017 -- the patient has been tolerating a 3 layer wrap and seems to be pleased with her progress 03/17/17 on evaluation today patient appears to be doing better in regard to her left lateral lower extremity wound. She has been Tolerating the wraps without complication. No fevers, chills, nausea, or vomiting noted at this time. She is pleased with how things are progressing. Early complaint is that she is unable to take a shower for such a long time from Wednesday all the way till the following Monday. 03/31/2017 - the patient and her nurse were concerned about the redness of the wound and the discharge from the wound but other than that everything else has been okay and she has been doing fairly well. She has no constitutional symptoms Objective Constitutional Pulse regular. Respirations normal and unlabored. Afebrile. Vitals Time Taken: 1:44 PM, Height: 63 in, Weight: 164.8 lbs, BMI: 29.2, Temperature: 97.9 F, Pulse: 68 bpm, Respiratory Rate: 16 breaths/min, Blood Pressure: 151/73 mmHg. Eyes Nonicteric. Reactive to light. Ears, Nose, Mouth, and Throat Lips, teeth, and gums WNL.Marland Kitchen Moist mucosa without lesions. Neck supple and nontender. No palpable supraclavicular or cervical adenopathy. Normal sized without goiter. Respiratory WNL. No retractions.. Cardiovascular Pedal Pulses WNL. No clubbing, cyanosis or edema. Lymphatic No adneopathy. No adenopathy. No adenopathy. Musculoskeletal Adexa without tenderness or enlargement.. Digits and nails w/o clubbing, cyanosis, infection, petechiae, ischemia, or inflammatory conditions.Marland Kitchen Psychiatric Judgement and insight Intact.. No evidence of depression, anxiety, or agitation.. General Notes: the  left lower extremity lymphedema is well  controlled and the wound is looking clean with healthy granulation tissue. No debridement was required today. Integumentary (Hair, Skin) [place], Shunta F. ([id number]) No suspicious lesions. No crepitus or fluctuance. No peri-wound warmth or erythema. No masses.. Wound #1 status is Open. Original cause of wound was Gradually Appeared. The wound is located on the Left,Lateral Lower Leg. The wound measures 4.5cm length x 2.5cm width x 0.1cm depth; 8.836cm^2 area and 0.884cm^3 volume. There is Fat Layer (Subcutaneous Tissue) Exposed exposed. There is no tunneling or undermining noted. There is a large amount of serosanguineous drainage noted. The wound margin is flat and intact. There is large (67-100%) granulation within the wound bed. There is a small (1-33%) amount of necrotic tissue within the wound bed including Adherent Slough. The periwound skin appearance exhibited: Excoriation, Maceration, Erythema. The periwound skin appearance did not exhibit: Callus, Crepitus, Induration, Rash, Scarring, Dry/Scaly, Atrophie Blanche, Cyanosis, Ecchymosis, Hemosiderin Staining, Mottled, Pallor, Rubor. The surrounding wound skin color is noted with erythema which is circumferential. Periwound temperature was noted as No Abnormality. The periwound has tenderness on palpation. Assessment Active Problems ICD-10 E11.622 - Type 2 diabetes mellitus with other skin ulcer I10 - Essential (primary) hypertension I89.0 - Lymphedema, not elsewhere classified Plan Wound Cleansing: Wound #1 Left,Lateral Lower Leg: Clean wound with wound cleanser. Cleanse wound with mild soap and water Anesthetic: Wound #1 Left,Lateral Lower Leg: Topical Lidocaine 4% cream applied to wound bed prior to debridement Skin Barriers/Peri-Wound Care: Wound #1 Left,Lateral Lower Leg: Barrier cream Primary Wound Dressing: Wound #1 Left,Lateral Lower Leg: Hydrafera Blue - (Ready Transfer) (ONLY) please moisten to remove Secondary  Dressing: Wound #1 Left,Lateral Lower Leg: ABD pad Drawtex Dressing Change Frequency: Change Dressing Monday, Wednesday, Friday - HHRN to go change dressing Monday, Wednesday and Friday the week of 03/20/17 - 03/24/17 and resume to normal days Monday and Wednesday on the week after Follow-up Appointments: Wound #1 Left,Lateral Lower Leg: Return Appointment in 1 week. Edema Control: Wound #1 Left,Lateral Lower Leg: 3 Layer Compression System - Left Lower Extremity - Please wrap 3cm from toes and 3cm from knee. unna to anchor Elevate legs to the level of the heart and pump ankles as often as possible Laura [name], [name] ([id number]) Additional Orders / Instructions: Wound #1 Left,Lateral Lower Leg: Increase protein intake. Home Health: Wound #1 Left,Lateral Lower Leg: Continue Home Health Visits - HHRN to change dressing Monday and Wednesday. Please order these supplies for the pt. Home Health Nurse may visit PRN to address patient s wound care needs. FACE TO FACE ENCOUNTER: MEDICARE and MEDICAID PATIENTS: I certify that this patient is under my care and that I had a face-to-face encounter that meets the physician face-to-face encounter requirements with this patient on this date. The encounter with the patient was in whole or in part for the following MEDICAL CONDITION: (primary reason for [place]) MEDICAL NECESSITY: I certify, that based on my findings, NURSING services are a medically necessary home health service. HOME BOUND STATUS: I certify that my clinical findings support that this patient is homebound (i.e., Due to illness or injury, pt requires aid of supportive devices such as crutches, cane, wheelchairs, walkers, the use of special transportation or the assistance of another person to leave their place of residence. There is a normal inability to leave the home and doing so requires considerable and taxing effort. Other absences are for medical reasons / religious services  and are infrequent or of short duration when  for other reasons). If current dressing causes regression in wound condition, may D/C ordered dressing product/s and apply Normal Saline Moist Dressing daily until next [place] / Other MD appointment. [place] of regression in wound condition at [phone]. Please direct any NON-WOUND related issues/requests for orders to patient's Primary Care Physician Medications-please add to medication list.: Wound #1 Left,Lateral Lower Leg: P.O. Antibiotics - Continue your Doxycycline Other: - Vitamin A, Vitamin C, Zinc, MVI I have reviewed details of her wound care with her and recommended: 1. Hydrofera blue and a 3 layer Profore wrap to be changed 3 times a week. 2. Elevation and exercise has been discussed with her 3. She will return to see [place] next week for a review. she had several questions which I have answered to her satisfaction Electronic Signature(s) Signed: 04/07/2017 2:16:53 PM By: Laura Fudge MD, FACS Entered By: Laura [name] on 04/07/2017 14:16:53 Laura [name], [name] ([id number]) -------------------------------------------------------------------------------- SuperBill Details Patient Name: Pettibone, Dawnetta F. Date of Service: 04/07/2017 Medical Record Number: [id number] Patient Account Number: [id number] Date of Birth/Sex: 03/28/1925 (83 y.o. Female) Treating RN: Carolyne Fiscal, Debi Primary Care Provider: Halina [name] Other Clinician: Referring Provider: Halina [name] Treating Provider/Extender: Laura [name]: 14 Diagnosis Coding ICD-10 Codes Code Description E11.622 Type 2 diabetes mellitus with other skin ulcer I10 Essential (primary) hypertension I89.0 Lymphedema, not elsewhere classified Facility Procedures CPT4 Code: [id number] Description: (Facility Use Only) [phone] - [place] [facility] Modifier: Quantity: 1 Physician Procedures CPT4 Code: 8280034 Description: 91791 -  [facility] PHYS LEVEL 3 - EST PT ICD-10 Diagnosis Description E11.622 Type 2 diabetes mellitus with other skin ulcer I10 Essential (primary) hypertension I89.0 Lymphedema, not elsewhere classified Modifier: Quantity: 1 Electronic Signature(s) Signed: 04/07/2017 2:38:22 PM By: Alric Quan Signed: 04/07/2017 4:07:58 PM By: Laura Fudge MD, FACS Previous Signature: 04/07/2017 2:17:20 PM Version By: Laura Fudge MD, FACS Previous Signature: 04/07/2017 2:17:05 PM Version By: Laura Fudge MD, FACS Entered By: Alric Quan on 04/07/2017 14:38:22

## 2017-04-14 ENCOUNTER — Encounter: Payer: Medicare PPO | Admitting: Physician Assistant

## 2017-04-21 ENCOUNTER — Encounter: Payer: Medicare PPO | Attending: Surgery | Admitting: Surgery

## 2017-04-21 DIAGNOSIS — L97911 Non-pressure chronic ulcer of unspecified part of right lower leg limited to breakdown of skin: Secondary | ICD-10-CM | POA: Diagnosis not present

## 2017-04-21 DIAGNOSIS — E11622 Type 2 diabetes mellitus with other skin ulcer: Secondary | ICD-10-CM | POA: Diagnosis not present

## 2017-04-21 DIAGNOSIS — Z87891 Personal history of nicotine dependence: Secondary | ICD-10-CM | POA: Insufficient documentation

## 2017-04-21 DIAGNOSIS — I10 Essential (primary) hypertension: Secondary | ICD-10-CM | POA: Insufficient documentation

## 2017-04-21 DIAGNOSIS — I890 Lymphedema, not elsewhere classified: Secondary | ICD-10-CM | POA: Diagnosis not present

## 2017-04-21 DIAGNOSIS — Z7984 Long term (current) use of oral hypoglycemic drugs: Secondary | ICD-10-CM | POA: Insufficient documentation

## 2017-04-23 NOTE — Progress Notes (Signed)
CRUTCHLEY, Dyna F. ([id number]) Visit Report for 04/21/2017 Chief Complaint Document Details Patient Name: Laura Laura [name], [name] Date of Service: 04/21/2017 12:30 PM Medical Record Number: [id number] Patient Account Number: [id number] Date of Birth/Sex: 12/16/1924 (83 y.o. Female) Treating RN: Ahmed Prima Primary Care Provider: Halina Maidens Other Clinician: Referring Provider: Halina Maidens Treating Provider/Extender: Frann Rider in Treatment: 16 Information Obtained from: Patient Chief Complaint Left lower leg cellulitis Electronic Signature(s) Signed: 04/21/2017 2:01:24 PM By: Christin Fudge MD, FACS Entered By: Christin Fudge on 04/21/2017 14:01:24 Hartfield, Lilli F. ([id number]) -------------------------------------------------------------------------------- HPI Details Patient Name: Laura [name], [name] Date of Service: 04/21/2017 12:30 PM Medical Record Number: [id number] Patient Account Number: [id number] Date of Birth/Sex: 1924-11-22 (83 y.o. Female) Treating RN: Carolyne Fiscal, Debi Primary Care Provider: Halina Maidens Other Clinician: Referring Provider: Halina Maidens Treating Provider/Extender: Frann Rider in Treatment: 16 History of Present Illness HPI Description: 12/30/16 Patient presents today for initial evaluation concerning an area over her left lateral ankle which she tells me has been intermittent in nature since 2000. However the current opening has been present for about two weeks. She has been tolerating the dressing changes at home with antibiotic ointment but that is really the only treatment that has been initiated at this point. That is other than the oral antibiotics which was Keflex that patient was placed on by her primary care provider prior to being referred to [place]. She does have some discomfort but rates this to be around a 2-3 out of 10 fortunately the redness does not seem to be spreading to any other location as far as her lower extremity is concerned. She does  tell me that in the past antibiotics have been beneficial for her unfortunately the current antibiotics have not been of benefit and no wound culture was performed as of yet. She has previously seen Dr. 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 She does have diabetes and are most recent blood sugars have run between 104 and 106 according to patient's although her last hemoglobin A1c she is unaware of. Patient's current wound does not appear to be significantly open but is rather more of a weeping region of cellulitis. 01/27/17 on evaluation today patient left lower for many wound continues to show signs of erythema surrounding and in fact she is noted to have erythema from her toes up to just below the knee in regard to left lower extremity. She is not having any fevers, chills, nausea, vomiting, or diarrhea at this point. With that being said she is concerned about the silver alginate dressing. She tells me that in the past she used silver and some of the dressings for previous one and did not do well with it. Nonetheless she has discomfort along the wound itself but no other pain noted throughout the left lower extremity. 02/03/17 on evaluation today patient appears to be doing fairly well overall other than the fact that her wound is worsening on the lateral aspect of her left lower extremity. It appears more macerated and even slough covered at this point. With that being said she has been attempting to perform the dressing changes on her own although I'm not sure that she is understanding exactly how this should be done. Nonetheless I do believe the gentamicin may be causing too much maceration she really has not wanted to utilize  the silver alginate dressings past due to a previous issue with this. Overall her erythema  of the left lower extremity is improved she still has significant swelling. No fevers, chills, nausea, or vomiting noted at this time. 02/10/2017 -- Old notes received -- was seen in 2009 by [facility] by Dr. Romie Levee -- his impression was chronic patches of dermatitis on the left lower extremity and current mild swelling. He recommended venous insufficiency venous reflux studies. She had no evidence of arterial insufficiency. Follow-up chronic venous insufficiency study done on 12/16/2007, showed very mild amount of reflux on the great saphenous vein at the knee but the function of the vein is normal and there was no evidence by the venous reflux study.. They referred her to dermatology for a second opinion and would see her back in about 4-6 weeks. She was seen back in follow-up in January 01 2008 and at that time she was asked to use a steroid ointment locally and the wounds are almost completely healed. The patient has told me today that she does not tolerate silver dressing -- and she has not had home health come and change her dressings over the last week. 02/17/2017 -- - had a lower arterial study done which showed a bilateral ABI suggests no significant lower extremity arterial disease and the toe brachial indicis were normal on the right and abnormal on the left. The right ABI was 1.0 and the left was 1.07. The right toe brachial indices was 0.81 on the right and 0.56 on the left. She had biphasic and triphasic flow at the tibial vessels. 02/24/2017 -- the patient has been tolerating a 3 layer wrap and seems to be pleased with her progress 03/17/17 on evaluation today patient appears to be doing better in regard to her left lateral lower extremity wound. She has been Tolerating the wraps without complication. No fevers, chills, nausea, or vomiting noted at this time. She is pleased with Gimbel, Sharonne F. ([id number]) how things are progressing. Early complaint is that she is unable to take  a shower for such a long time from Wednesday all the way till the following Monday. 03/31/2017 - the patient and her nurse were concerned about the redness of the wound and the discharge from the wound but other than that everything else has been okay and she has been doing fairly well. She has no constitutional symptoms Electronic Signature(s) Signed: 04/21/2017 2:01:30 PM By: Christin Fudge MD, FACS Entered By: Christin Fudge on 04/21/2017 14:01:30 Tiedeman, Delonna F. ([id number]) -------------------------------------------------------------------------------- Physical Exam Details Patient Name: Laura [name], [name] Date of Service: 04/21/2017 12:30 PM Medical Record Number: [id number] Patient Account Number: [id number] Date of Birth/Sex: October 05, 1924 (83 y.o. Female) Treating RN: Ahmed Prima Primary Care Provider: Halina Maidens Other Clinician: Referring Provider: Halina Maidens Treating Provider/Extender: Frann Rider in Treatment: 16 Constitutional . Pulse regular. Respirations normal and unlabored. Afebrile. . Eyes Nonicteric. Reactive to light. Ears, Nose, Mouth, and Throat Lips, teeth, and gums WNL.Marland Kitchen Moist mucosa without lesions. Neck supple and nontender. No palpable supraclavicular or cervical adenopathy. Normal sized without goiter. Respiratory WNL. No retractions.. Cardiovascular Pedal Pulses WNL. No clubbing, cyanosis or edema. Lymphatic No adneopathy. No adenopathy. No adenopathy. Musculoskeletal Adexa without tenderness or enlargement.. Digits and nails w/o clubbing, cyanosis, infection, petechiae, ischemia, or inflammatory conditions.. Integumentary (Hair, Skin) No suspicious lesions. No crepitus or fluctuance. No peri-wound warmth or erythema. No masses.Marland Kitchen Psychiatric Judgement and insight Intact.. No evidence of depression, anxiety, or agitation.. Notes the lymphedema  is well controlled but she seems to have a little bit of moisture and maceration around the wound.  There is no cellulitis Electronic Signature(s) Signed: 04/21/2017 2:02:10 PM By: Christin Fudge MD, FACS Entered By: Christin Fudge on 04/21/2017 14:02:10 Vigeant, Lynnann F. ([id number]) -------------------------------------------------------------------------------- Physician Orders Details Patient Name: Pigeon, Shakaya F. Date of Service: 04/21/2017 12:30 PM Medical Record Number: [id number] Patient Account Number: [id number] Date of Birth/Sex: [birth year] (83 y.o. Female) Treating RN: Carolyne Fiscal, Debi Primary Care Provider: Halina Maidens Other Clinician: Referring Provider: Halina Maidens Treating Provider/Extender: Frann Rider in Treatment: 16 Verbal / Phone Orders: Yes Clinician: Carolyne Fiscal, Debi Read Back and Verified: Yes Diagnosis Coding Wound Cleansing Wound #1 Left,Lateral Lower Leg o Clean wound with wound cleanser. o Cleanse wound with mild soap and water Anesthetic Wound #1 Left,Lateral Lower Leg o Topical Lidocaine 4% cream applied to wound bed prior to debridement Skin Barriers/Peri-Wound Care Wound #1 Left,Lateral Lower Leg o Barrier cream Primary Wound Dressing Wound #1 Left,Lateral Lower Leg o Hydrafera Blue - Nurse to put hydrafera blue on wound when ready to be changed (Ready Transfer) (ONLY) please moisten to remove o Cutimed Sorbact - (in wound care office only with foam over sorbact) Secondary Dressing Wound #1 Left,Lateral Lower Leg o ABD pad o Drawtex Dressing Change Frequency o Change Dressing Monday, Wednesday, Friday - HHRN to go change dressing Monday, Wednesday and Friday the week of 03/20/17 - 03/24/17 and resume to normal days Monday and Wednesday on the week after Follow-up Appointments Wound #1 Left,Lateral Lower Leg o Return Appointment in 1 week. Edema Control Wound #1 Left,Lateral Lower Leg o 3 Layer Compression System - Left Lower Extremity - Please wrap 3cm from toes and 3cm from knee. unna to anchor o Elevate legs  to the level of the heart and pump ankles as often as possible Additional Orders / Instructions Wound #1 Left,Lateral Lower Leg Adelson, Ndia F. ([id number]) o Increase protein intake. Home Health Wound #1 Left,Lateral Lower Leg o Continue Home Health Visits - [facility] to change dressing Monday and Wednesday. Please order these supplies for the pt. o Home Health Nurse may visit PRN to address patientos wound care needs. o FACE TO FACE ENCOUNTER: MEDICARE and MEDICAID PATIENTS: I certify that this patient is under my care and that I had a face-to-face encounter that meets the physician face-to-face encounter requirements with this patient on this date. The encounter with the patient was in whole or in part for the following MEDICAL CONDITION: (primary reason for [place]) MEDICAL NECESSITY: I certify, that based on my findings, NURSING services are a medically necessary home health service. HOME BOUND STATUS: I certify that my clinical findings support that this patient is homebound (i.e., Due to illness or injury, pt requires aid of supportive devices such as crutches, cane, wheelchairs, walkers, the use of special transportation or the assistance of another person to leave their place of residence. There is a normal inability to leave the home and doing so requires considerable and taxing effort. Other absences are for medical reasons / religious services and are infrequent or of short duration when for other reasons). o If current dressing causes regression in wound condition, may D/C ordered dressing product/s and apply Normal Saline Moist Dressing daily until next [place] / Other MD appointment. [place] of regression in wound condition at [phone]. o Please direct any NON-WOUND related issues/requests for orders to patient's Primary Care Physician Medications-please add to medication list. Wound #1 Left,Lateral Lower  Leg o Other: -  Vitamin A, Vitamin C, Zinc, MVI Electronic Signature(s) Signed: 04/21/2017 4:07:41 PM By: Alric Quan Signed: 04/21/2017 4:21:18 PM By: Christin Fudge MD, FACS Entered By: Alric Quan on 04/21/2017 13:10:01 Favata, [name] ([id number]) -------------------------------------------------------------------------------- Problem List Details Patient Name: Beavin, Mena F. Date of Service: 04/21/2017 12:30 PM Medical Record Number: [id number] Patient Account Number: [id number] Date of Birth/Sex: 1924-07-06 (83 y.o. Female) Treating RN: Carolyne Fiscal, Debi Primary Care Provider: Halina Maidens Other Clinician: Referring Provider: Halina Maidens Treating Provider/Extender: Frann Rider in Treatment: 16 Active Problems ICD-10 Encounter Code Description Active Date Diagnosis E11.622 Type 2 diabetes mellitus with other skin ulcer 01/02/2017 Yes I10 Essential (primary) hypertension 01/02/2017 Yes I89.0 Lymphedema, not elsewhere classified 01/16/2017 Yes Inactive Problems Resolved Problems ICD-10 Code Description Active Date Resolved Date L03.116 Cellulitis of left lower limb 01/02/2017 01/02/2017 Electronic Signature(s) Signed: 04/21/2017 2:01:14 PM By: Christin Fudge MD, FACS Entered By: Christin Fudge on 04/21/2017 14:01:13 Collett, Tonnie F. ([id number]) -------------------------------------------------------------------------------- Progress Note Details Patient Name: Cowley, Noa F. Date of Service: 04/21/2017 12:30 PM Medical Record Number: [id number] Patient Account Number: [id number] Date of Birth/Sex: 05/07/1925 (83 y.o. Female) Treating RN: Carolyne Fiscal, Debi Primary Care Provider: Halina Maidens Other Clinician: Referring Provider: Halina Maidens Treating Provider/Extender: Frann Rider in Treatment: 16 Subjective Chief Complaint Information obtained from Patient Left lower leg cellulitis History of Present Illness (HPI) 12/30/16 Patient presents today for initial evaluation  concerning an area over her left lateral ankle which she tells me has been intermittent in nature since 2000. However the current opening has been present for about two weeks. She has been tolerating the dressing changes at home with antibiotic ointment but that is really the only treatment that has been initiated at this point. That is other than the oral antibiotics which was Keflex that patient was placed on by her primary care provider prior to being referred to [place]. She does have some discomfort but rates this to be around a 2-3 out of 10 fortunately the redness does not seem to be spreading to any other location as far as her lower extremity is concerned. She does tell me that in the past antibiotics have been beneficial for her unfortunately the current antibiotics have not been of benefit and no wound culture was performed as of yet. She has previously seen Dr. 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 She does have diabetes and are most recent blood sugars have run between 104 and 106 according to patient's although her last hemoglobin A1c she is unaware of. Patient's current wound does not appear to be significantly open but is rather more of a weeping region of cellulitis. 01/27/17 on evaluation today patient left lower for many wound continues to show signs of erythema surrounding and in fact she is noted to have erythema from her toes up to just below the knee in regard to left lower extremity. She is not having any fevers, chills, nausea, vomiting, or diarrhea at this point. With that being said she is concerned about the silver alginate dressing. She tells me that in the past she used silver and some of the dressings for previous one and did not do well with it. Nonetheless she has discomfort along the wound  itself but no other pain noted throughout the left lower extremity. 02/03/17 on evaluation today  patient appears to be doing fairly well overall other than the fact that her wound is worsening on the lateral aspect of her left lower extremity. It appears more macerated and even slough covered at this point. With that being said she has been attempting to perform the dressing changes on her own although I'm not sure that she is understanding exactly how this should be done. Nonetheless I do believe the gentamicin may be causing too much maceration she really has not wanted to utilize the silver alginate dressings past due to a previous issue with this. Overall her erythema of the left lower extremity is improved she still has significant swelling. No fevers, chills, nausea, or vomiting noted at this time. 02/10/2017 -- Old notes received -- was seen in 2009 by [facility] by Dr. Romie Levee -- his impression was chronic patches of dermatitis on the left lower extremity and current mild swelling. He recommended venous insufficiency venous reflux studies. She had no evidence of arterial insufficiency. Follow-up chronic venous insufficiency study done on 12/16/2007, showed very mild amount of reflux on the great saphenous vein at the knee but the function of the vein is normal and there was no evidence by the venous reflux study.. They referred her to dermatology for a second opinion and would see her back in about 4-6 weeks. She was seen back in follow-up in January 01 2008 and at that time she was asked to use a steroid ointment locally and the wounds are almost completely healed. The patient has told me today that she does not tolerate silver dressing -- and she has not had home health come and change her dressings over the last week. 02/17/2017 -- - had a lower arterial study done which showed a bilateral ABI suggests no significant lower extremity arterial disease and the toe brachial indicis  were normal on the right and abnormal on the left. The right ABI was 1.0 and the left was 1.07. The right toe brachial indices was 0.81 on the right and 0.56 on the left. She had biphasic and triphasic flow at the tibial Mcaffee, Azaryah F. ([id number]) vessels. 02/24/2017 -- the patient has been tolerating a 3 layer wrap and seems to be pleased with her progress 03/17/17 on evaluation today patient appears to be doing better in regard to her left lateral lower extremity wound. She has been Tolerating the wraps without complication. No fevers, chills, nausea, or vomiting noted at this time. She is pleased with how things are progressing. Early complaint is that she is unable to take a shower for such a long time from Wednesday all the way till the following Monday. 03/31/2017 - the patient and her nurse were concerned about the redness of the wound and the discharge from the wound but other than that everything else has been okay and she has been doing fairly well. She has no constitutional symptoms Patient History Information obtained from Patient. Family History Cancer - Siblings,Child,Maternal Grandparents, Diabetes - Mother,Siblings, Heart Disease - Father, Hypertension - Siblings, Stroke - Father,Mother, Thyroid Problems - Child, No family history of Hereditary Spherocytosis, Kidney Disease, Lung Disease, Seizures, Tuberculosis. Social History Former smoker - smoked late teens early 58s, Marital Status - Widowed, Alcohol Use - Never, Drug Use - No History, Caffeine Use - Daily. Objective Constitutional Pulse regular. Respirations normal and unlabored. Afebrile. Vitals Time Taken: 12:47 PM, Height: 63 in, Weight: 164.8 lbs, BMI: 29.2, Temperature: 98.5 F, Pulse: 79 bpm, Respiratory Rate: 16 breaths/min, Blood Pressure: 142/79  mmHg. Eyes Nonicteric. Reactive to light. Ears, Nose, Mouth, and Throat Lips, teeth, and gums WNL.Marland Kitchen Moist mucosa without lesions. Neck supple and nontender. No  palpable supraclavicular or cervical adenopathy. Normal sized without goiter. Respiratory WNL. No retractions.. Cardiovascular Pedal Pulses WNL. No clubbing, cyanosis or edema. Fernicola, Wynonna F. ([id number]) Lymphatic No adneopathy. No adenopathy. No adenopathy. Musculoskeletal Adexa without tenderness or enlargement.. Digits and nails w/o clubbing, cyanosis, infection, petechiae, ischemia, or inflammatory conditions.Marland Kitchen Psychiatric Judgement and insight Intact.. No evidence of depression, anxiety, or agitation.. General Notes: the lymphedema is well controlled but she seems to have a little bit of moisture and maceration around the wound. There is no cellulitis Integumentary (Hair, Skin) No suspicious lesions. No crepitus or fluctuance. No peri-wound warmth or erythema. No masses.. Wound #1 status is Open. Original cause of wound was Gradually Appeared. The wound is located on the Left,Lateral Lower Leg. The wound measures 5cm length x 4cm width x 0.1cm depth; 15.708cm^2 area and 1.571cm^3 volume. There is Fat Layer (Subcutaneous Tissue) Exposed exposed. There is no tunneling or undermining noted. There is a large amount of serosanguineous drainage noted. The wound margin is flat and intact. There is large (67-100%) granulation within the wound bed. There is a small (1-33%) amount of necrotic tissue within the wound bed including Adherent Slough. The periwound skin appearance exhibited: Excoriation, Maceration, Erythema. The periwound skin appearance did not exhibit: Callus, Crepitus, Induration, Rash, Scarring, Dry/Scaly, Atrophie Blanche, Cyanosis, Ecchymosis, Hemosiderin Staining, Mottled, Pallor, Rubor. The surrounding wound skin color is noted with erythema which is circumferential. Periwound temperature was noted as No Abnormality. The periwound has tenderness on palpation. Assessment Active Problems ICD-10 E11.622 - Type 2 diabetes mellitus with other skin ulcer I10 - Essential  (primary) hypertension I89.0 - Lymphedema, not elsewhere classified Plan Wound Cleansing: Wound #1 Left,Lateral Lower Leg: Clean wound with wound cleanser. Cleanse wound with mild soap and water Anesthetic: Wound #1 Left,Lateral Lower Leg: Topical Lidocaine 4% cream applied to wound bed prior to debridement Skin Barriers/Peri-Wound Care: Wound #1 Left,Lateral Lower Leg: Barrier cream Primary Wound Dressing: Wound #1 Left,Lateral Lower Leg: Laye, [place] F. ([id number]) Hydrafera Blue - Nurse to put hydrafera blue on wound when ready to be changed (Ready Transfer) (ONLY) please moisten to remove Cutimed Sorbact - (in wound care office only with foam over sorbact) Secondary Dressing: Wound #1 Left,Lateral Lower Leg: ABD pad Drawtex Dressing Change Frequency: Change Dressing Monday, Wednesday, Friday - HHRN to go change dressing Monday, Wednesday and Friday the week of 03/20/17 - 03/24/17 and resume to normal days Monday and Wednesday on the week after Follow-up Appointments: Wound #1 Left,Lateral Lower Leg: Return Appointment in 1 week. Edema Control: Wound #1 Left,Lateral Lower Leg: 3 Layer Compression System - Left Lower Extremity - Please wrap 3cm from toes and 3cm from knee. unna to anchor Elevate legs to the level of the heart and pump ankles as often as possible Additional Orders / Instructions: Wound #1 Left,Lateral Lower Leg: Increase protein intake. Home Health: Wound #1 Left,Lateral Lower Leg: Continue Home Health Visits - HHRN to change dressing Monday and Wednesday. Please order these supplies for the pt. Home Health Nurse may visit PRN to address patient s wound care needs. FACE TO FACE ENCOUNTER: MEDICARE and MEDICAID PATIENTS: I certify that this patient is under my care and that I had a face-to-face encounter that meets the physician face-to-face encounter requirements with this patient on this date. The encounter with the patient was in whole or in part for  the  following MEDICAL CONDITION: (primary reason for Home Healthcare) MEDICAL NECESSITY: I certify, that based on my findings, NURSING services are a medically necessary home health service. HOME BOUND STATUS: I certify that my clinical findings support that this patient is homebound (i.e., Due to illness or injury, pt requires aid of supportive devices such as crutches, cane, wheelchairs, walkers, the use of special transportation or the assistance of another person to leave their place of residence. There is a normal inability to leave the home and doing so requires considerable and taxing effort. Other absences are for medical reasons / religious services and are infrequent or of short duration when for other reasons). If current dressing causes regression in wound condition, may D/C ordered dressing product/s and apply Normal Saline Moist Dressing daily until next [place] / Other MD appointment. [place] of regression in wound condition at [phone]. Please direct any NON-WOUND related issues/requests for orders to patient's Primary Care Physician Medications-please add to medication list.: Wound #1 Left,Lateral Lower Leg: Other: - Vitamin A, Vitamin C, Zinc, MVI she is not able to tolerate silver alginate and hence we will use an alternative dressing and I have recommended: 1. Sorbact with foam and a 3 layer Profore wrap to be changed 3 times a week. 2. She can go to [facility] later when her home health nurse comes to change the dressing 3. Elevation and exercise has been discussed with her 4. She will return to see [place] next week for a review. she had several questions which I have answered to her satisfaction Electronic Signature(s) Signed: 04/21/2017 2:03:23 PM By: Christin Fudge MD, FACS Gutknecht, Rebekka F. ([id number]) Entered By: Christin Fudge on 04/21/2017 14:03:23 Slusher, Nohelani F.  ([id number]) -------------------------------------------------------------------------------- ROS/PFSH Details Patient Name: Laura [name], [name] Date of Service: 04/21/2017 12:30 PM Medical Record Number: [id number] Patient Account Number: [id number] Date of Birth/Sex: 1925-01-22 (83 y.o. Female) Treating RN: Carolyne Fiscal, Debi Primary Care Provider: Halina Maidens Other Clinician: Referring Provider: Halina Maidens Treating Provider/Extender: Frann Rider in Treatment: 16 Information Obtained From Patient Wound History Do you currently have one or more open woundso Yes How many open wounds do you currently haveo 1 Approximately how long have you had your woundso 2 weeks How have you been treating your wound(s) until nowo clobetasol Has your wound(s) ever healed and then re-openedo Yes Have you had any lab work done in the past montho No Have you tested positive for an antibiotic resistant organism (MRSA, VRE)o No Have you tested positive for osteomyelitis (bone infection)o Yes Have you had any tests for circulation on your legso Yes Where was the test doneo [facility] Have you had other problems associated with your woundso Swelling Endocrine Medical History: Positive for: Type II Diabetes Time with diabetes: 2003 Treated with: Oral agents Immunizations Pneumococcal Vaccine: Received Pneumococcal Vaccination: Yes Implantable Devices Family and Social History Cancer: Yes - Siblings,Child,Maternal Grandparents; Diabetes: Yes - Mother,Siblings; Heart Disease: Yes - Father; Hereditary Spherocytosis: No; Hypertension: Yes - Siblings; Kidney Disease: No; Lung Disease: No; Seizures: No; Stroke: Yes - Father,Mother; Thyroid Problems: Yes - Child; Tuberculosis: No; Former smoker - smoked late teens early 79s; Marital Status - Widowed; Alcohol Use: Never; Drug Use: No History; Caffeine Use: Daily; Financial Concerns: No; Food, Clothing or Shelter Needs: No; Support System Lacking: No;  Transportation Concerns: No; Advanced Directives: No; Patient does not want information on Advanced Directives; Do not resuscitate: No; Living Will: Yes (Not Provided); Medical Power of Attorney: Yes (  Not Provided) Physician Affirmation I have reviewed and agree with the above information. Electronic Signature(s) Signed: 04/21/2017 4:07:41 PM By: Alric Quan Signed: 04/21/2017 4:21:18 PM By: Christin Fudge MD, FACS Entered By: Christin Fudge on 04/21/2017 14:01:37 Richardson, Jordyn F. ([id number]) -------------------------------------------------------------------------------- SuperBill Details Patient Name: Laura [name], [name] Date of Service: 04/21/2017 Medical Record Number: [id number] Patient Account Number: [id number] Date of Birth/Sex: Dec 18, 1924 (83 y.o. Female) Treating RN: Carolyne Fiscal, Debi Primary Care Provider: Halina Maidens Other Clinician: Referring Provider: Halina Maidens Treating Provider/Extender: Frann Rider in Treatment: 16 Diagnosis Coding ICD-10 Codes Code Description E11.622 Type 2 diabetes mellitus with other skin ulcer I10 Essential (primary) hypertension I89.0 Lymphedema, not elsewhere classified Facility Procedures CPT4 Code: 70017494 Description: (Facility Use Only) [phone] - [place] LWR LT LEG Modifier: Quantity: 1 Physician Procedures CPT4 Code: 6384665 Description: 99357 - WC PHYS LEVEL 3 - EST PT ICD-10 Diagnosis Description E11.622 Type 2 diabetes mellitus with other skin ulcer I10 Essential (primary) hypertension I89.0 Lymphedema, not elsewhere classified Modifier: Quantity: 1 Electronic Signature(s) Signed: 04/21/2017 2:20:32 PM By: Alric Quan Signed: 04/21/2017 4:21:18 PM By: Christin Fudge MD, FACS Previous Signature: 04/21/2017 2:03:39 PM Version By: Christin Fudge MD, FACS Entered By: Alric Quan on 04/21/2017 14:20:31

## 2017-04-23 NOTE — Progress Notes (Signed)
GRABINSKI, Akaysha F. ([id number]) Visit Report for 04/21/2017 Arrival Information Details Patient Name: Laura [name], [name] Date of Service: 04/21/2017 12:30 PM Medical Record Number: [id number] Patient Account Number: [id number] Date of Birth/Sex: 07-25-1924 (83 y.o. Female) Treating RN: Carolyne Fiscal, Debi Primary Care Kyung Muto: Halina Maidens Other Clinician: Referring Daryll Spisak: Halina Maidens Treating Ashelynn Marks/Extender: Frann Rider in Treatment: 69 Visit Information History Since Last Visit All ordered tests and consults were completed: No Patient Arrived: Kasandra Knudsen Added or deleted any medications: No Arrival Time: 12:46 Any new allergies or adverse reactions: No Accompanied By: family Had a fall or experienced change in No Transfer Assistance: EasyP[employer] Patient activities of daily living that may affect Lift risk of falls: Patient Identification Verified: Yes Signs or symptoms of abuse/neglect since last visito No Secondary Verification Process Yes Hospitalized since last visit: No Completed: Has Dressing in Place as Prescribed: Yes Patient Requires Transmission-Based No Precautions: Has Compression in Place as Prescribed: Yes Patient Has Alerts: Yes Pain Present Now: No Patient Alerts: DM II Electronic Signature(s) Signed: 04/21/2017 4:07:41 PM By: Alric Quan Entered By: Alric Quan on 04/21/2017 12:47:00 Urschel, Izabell F. ([id number]) -------------------------------------------------------------------------------- Encounter Discharge Information Details Patient Name: Laura [name], [name] Date of Service: 04/21/2017 12:30 PM Medical Record Number: [id number] Patient Account Number: [id number] Date of Birth/Sex: 1924/10/07 (83 y.o. Female) Treating RN: Carolyne Fiscal, Debi Primary Care Acacia Latorre: Halina Maidens Other Clinician: Referring Katherine Tout: Halina Maidens Treating Ronit Marczak/Extender: Frann Rider in Treatment: 16 Encounter Discharge Information Items Discharge Pain Level:  0 Discharge Condition: Stable Ambulatory Status: Cane Discharge Destination: Home Transportation: Private Auto Accompanied By: friend Schedule Follow-up Appointment: Yes Medication Reconciliation completed and No provided to Patient/Care Karolyne Timmons: Provided on Clinical Summary of Care: 04/21/2017 Form Type Recipient Paper Patient [facility] Electronic Signature(s) Signed: 04/21/2017 1:29:18 PM By: Alric Quan Entered By: Alric Quan on 04/21/2017 13:29:18 Chatham, Shelitha F. ([id number]) -------------------------------------------------------------------------------- Lower Extremity Assessment Details Patient Name: Laura [name], [name] Date of Service: 04/21/2017 12:30 PM Medical Record Number: [id number] Patient Account Number: [id number] Date of Birth/Sex: 20-Sep-1924 (83 y.o. Female) Treating RN: Carolyne Fiscal, Debi Primary Care Chaela Branscum: Halina Maidens Other Clinician: Referring Shariyah Eland: Halina Maidens Treating Breya Cass/Extender: Frann Rider in Treatment: 16 Edema Assessment Assessed: Shirlyn Goltz: No] Patrice Paradise: No] [Left: Edema] [Right: :] Calf Left: Right: Point of Measurement: 27 cm From Medial Instep 30.5 cm cm Ankle Left: Right: Point of Measurement: 10 cm From Medial Instep 22 cm cm Vascular Assessment Pulses: Dorsalis Pedis Palpable: [Left:Yes] Posterior Tibial Extremity colors, hair growth, and conditions: Extremity Color: [Left:Red] Temperature of Extremity: [Left:Warm] Capillary Refill: [Left:< 3 seconds] Toe Nail Assessment Left: Right: Thick: No Discolored: Yes Deformed: No Improper Length and Hygiene: No Electronic Signature(s) Signed: 04/21/2017 4:07:41 PM By: Alric Quan Entered By: Alric Quan on 04/21/2017 12:59:47 Lacock, Yomayra F. ([id number]) -------------------------------------------------------------------------------- Multi Wound Chart Details Patient Name: Laura [name], [name] Date of Service: 04/21/2017 12:30 PM Medical Record Number: [id number] Patient  Account Number: [id number] Date of Birth/Sex: 01-Jan-1925 (83 y.o. Female) Treating RN: Carolyne Fiscal, Debi Primary Care Avyanna Spada: Halina Maidens Other Clinician: Referring Isobelle Tuckett: Halina Maidens Treating Waldine Zenz/Extender: Frann Rider in Treatment: 16 Vital Signs Height(in): 63 Pulse(bpm): 21 Weight(lbs): 164.8 Blood Pressure(mmHg): 142/79 Body Mass Index(BMI): 29 Temperature(F): 98.5 Respiratory Rate 16 (breaths/min): Photos: [N/A:N/A] Wound Location: Left Lower Leg - Lateral N/A N/A Wounding Event: Gradually Appeared N/A N/A Primary Etiology: Diabetic Wound/Ulcer of the N/A N/A Lower Extremity Comorbid History: Type II Diabetes N/A N/A Date Acquired: 12/16/2016 N/A N/A Weeks of Treatment: 16 N/A N/A Wound Status: Open N/A N/A Measurements  L x W x D 5x4x0.1 N/A N/A (cm) Area (cm) : 15.708 N/A N/A Volume (cm) : 1.571 N/A N/A % Reduction in Area: -1499.60% N/A N/A % Reduction in Volume: -1503.10% N/A N/A Classification: Grade 1 N/A N/A Exudate Amount: Large N/A N/A Exudate Type: Serosanguineous N/A N/A Exudate Color: red, brown N/A N/A Wound Margin: Flat and Intact N/A N/A Granulation Amount: Large (67-100%) N/A N/A Necrotic Amount: Small (1-33%) N/A N/A Exposed Structures: Fat Layer (Subcutaneous N/A N/A Tissue) Exposed: Yes Fascia: No Tendon: No Muscle: No Joint: No Bone: No Epithelialization: Small (1-33%) N/A N/A Stangelo, Grete F. ([id number]) Periwound Skin Texture: Excoriation: Yes N/A N/A Induration: No Callus: No Crepitus: No Rash: No Scarring: No Periwound Skin Moisture: Maceration: Yes N/A N/A Dry/Scaly: No Periwound Skin Color: Erythema: Yes N/A N/A Atrophie Blanche: No Cyanosis: No Ecchymosis: No Hemosiderin Staining: No Mottled: No Pallor: No Rubor: No Erythema Location: Circumferential N/A N/A Temperature: No Abnormality N/A N/A Tenderness on Palpation: Yes N/A N/A Wound Preparation: Ulcer Cleansing: N/A N/A Rinsed/Irrigated  with Saline, Other: soap and water Topical Anesthetic Applied: Other: lidocaine 4% Treatment Notes Wound #1 (Left, Lateral Lower Leg) 1. Cleansed with: Clean wound with Normal Saline Cleanse wound with antibacterial soap and water 2. Anesthetic Topical Lidocaine 4% cream to wound bed prior to debridement 3. Peri-wound Care: Barrier cream 5. Secondary Dressing Applied ABD Pad Foam 7. Secured with 3 Layer Compression System - Left Lower Extremity Notes unna to anchor, cutimed Wellsite geologist) Signed: 04/21/2017 2:01:18 PM By: Christin Fudge MD, FACS Entered By: Christin Fudge on 04/21/2017 14:01:18 Hibbard, Yazmina F. ([id number]) -------------------------------------------------------------------------------- [place] Details Patient Name: Laura [name], [name] Date of Service: 04/21/2017 12:30 PM Medical Record Number: [id number] Patient Account Number: [id number] Date of Birth/Sex: 1924-12-27 (83 y.o. Female) Treating RN: Carolyne Fiscal, Debi Primary Care Ivalee Strauser: Halina Maidens Other Clinician: Referring Aleyza Salmi: Halina Maidens Treating Rande Roylance/Extender: Frann Rider in Treatment: 16 Active Inactive ` Abuse / Safety / Falls / Self Care Management Nursing Diagnoses: Potential for falls Goals: Patient will not experience any injury related to falls Date Initiated: 12/30/2016 Target Resolution Date: 04/29/2017 Goal Status: Active Interventions: Assess Activities of Daily Living upon admission and as needed Assess: immobility, friction, shearing, incontinence upon admission and as needed Notes: ` Nutrition Nursing Diagnoses: Imbalanced nutrition Impaired glucose control: actual or potential Potential for alteratiion in Nutrition/Potential for imbalanced nutrition Goals: Patient/caregiver will maintain therapeutic glucose control Date Initiated: 12/30/2016 Target Resolution Date: 04/29/2017 Goal Status: Active Interventions: Assess patient  nutrition upon admission and as needed per policy Notes: ` Orientation to the Wound Care Program Nursing Diagnoses: Knowledge deficit related to the wound healing center program Goals: Patient/caregiver will verbalize understanding of the [place] Date Initiated: 12/30/2016 Target Resolution Date: 01/21/2017 Cephus, Dallis Wanda Plump ([id number]) Goal Status: Active Interventions: Provide education on orientation to the wound center Notes: ` Pain, Acute or Chronic Nursing Diagnoses: Pain, acute or chronic: actual or potential Potential alteration in comfort, pain Goals: Patient/caregiver will verbalize adequate pain control between visits Date Initiated: 12/30/2016 Target Resolution Date: 04/29/2017 Goal Status: Active Interventions: Complete pain assessment as per visit requirements Notes: ` Wound/Skin Impairment Nursing Diagnoses: Impaired tissue integrity Knowledge deficit related to smoking impact on wound healing Knowledge deficit related to ulceration/compromised skin integrity Goals: Ulcer/skin breakdown will have a volume reduction of 80% by week 12 Date Initiated: 12/30/2016 Target Resolution Date: 04/22/2017 Goal Status: Active Interventions: Assess patient/caregiver ability to perform ulcer/skin care regimen upon admission and as needed Notes:  Electronic Signature(s) Signed: 04/21/2017 4:07:41 PM By: Alric Quan Entered By: Alric Quan on 04/21/2017 13:00:23 Laura [name], [name] ([id number]) -------------------------------------------------------------------------------- Pain Assessment Details Patient Name: Rastetter, Ruvi F. Date of Service: 04/21/2017 12:30 PM Medical Record Number: [id number] Patient Account Number: [id number] Date of Birth/Sex: 12/27/1924 (83 y.o. Female) Treating RN: Carolyne Fiscal, Debi Primary Care Sephora Boyar: Halina Maidens Other Clinician: Referring Josephus Harriger: Halina Maidens Treating Jniyah Dantuono/Extender: Frann Rider in Treatment:  16 Active Problems Location of Pain Severity and Description of Pain Patient Has Paino No Site Locations Pain Management and Medication Current Pain Management: Electronic Signature(s) Signed: 04/21/2017 4:07:41 PM By: Alric Quan Entered By: Alric Quan on 04/21/2017 12:47:05 Laura [name], [name] ([id number]) -------------------------------------------------------------------------------- Patient/Caregiver Education Details Patient Name: Laura [name], Sharay F. Date of Service: 04/21/2017 12:30 PM Medical Record Number: [id number] Patient Account Number: [id number] Date of Birth/Gender: February 08, 1925 (83 y.o. Female) Treating RN: Ahmed Prima Primary Care Physician: Halina Maidens Other Clinician: Referring Physician: Halina Maidens Treating Physician/Extender: Frann Rider in Treatment: 16 Education Assessment Education Provided To: Patient Education Topics Provided Wound/Skin Impairment: Handouts: Other: change dressing as ordered Methods: Demonstration, Explain/Verbal Responses: State content correctly Electronic Signature(s) Signed: 04/21/2017 4:07:41 PM By: Alric Quan Entered By: Alric Quan on 04/21/2017 13:03:11 Laura [name], [name] ([id number]) -------------------------------------------------------------------------------- Wound Assessment Details Patient Name: Laura [name], [name] Date of Service: 04/21/2017 12:30 PM Medical Record Number: [id number] Patient Account Number: [id number] Date of Birth/Sex: 1925/03/02 (83 y.o. Female) Treating RN: Carolyne Fiscal, Debi Primary Care Shantasia Hunnell: Halina Maidens Other Clinician: Referring Daysy Santini: Halina Maidens Treating Valborg Friar/Extender: Frann Rider in Treatment: 16 Wound Status Wound Number: 1 Primary Etiology: Diabetic Wound/Ulcer of the Lower Extremity Wound Location: Left Lower Leg - Lateral Wound Status: Open Wounding Event: Gradually Appeared Comorbid Type II Diabetes Date Acquired: 12/16/2016 History: Weeks Of  Treatment: 16 Clustered Wound: No Photos Photo Uploaded By: Alric Quan on 04/21/2017 13:35:26 Wound Measurements Length: (cm) 5 Width: (cm) 4 Depth: (cm) 0.1 Area: (cm) 15.708 Volume: (cm) 1.571 % Reduction in Area: -1499.6% % Reduction in Volume: -1503.1% Epithelialization: Small (1-33%) Tunneling: No Undermining: No Wound Description Classification: Grade 1 Wound Margin: Flat and Intact Exudate Amount: Large Exudate Type: Serosanguineous Exudate Color: red, brown Foul Odor After Cleansing: No Slough/Fibrino Yes Wound Bed Granulation Amount: Large (67-100%) Exposed Structure Necrotic Amount: Small (1-33%) Fascia Exposed: No Necrotic Quality: Adherent Slough Fat Layer (Subcutaneous Tissue) Exposed: Yes Tendon Exposed: No Muscle Exposed: No Joint Exposed: No Bone Exposed: No Periwound Skin Texture Laura [name], [name] ([id number]) Texture Color No Abnormalities Noted: No No Abnormalities Noted: No Callus: No Atrophie Blanche: No Crepitus: No Cyanosis: No Excoriation: Yes Ecchymosis: No Induration: No Erythema: Yes Rash: No Erythema Location: Circumferential Scarring: No Hemosiderin Staining: No Mottled: No Moisture Pallor: No No Abnormalities Noted: No Rubor: No Dry / Scaly: No Maceration: Yes Temperature / Pain Temperature: No Abnormality Tenderness on Palpation: Yes Wound Preparation Ulcer Cleansing: Rinsed/Irrigated with Saline, Other: soap and water, Topical Anesthetic Applied: Other: lidocaine 4%, Treatment Notes Wound #1 (Left, Lateral Lower Leg) 1. Cleansed with: Clean wound with Normal Saline Cleanse wound with antibacterial soap and water 2. Anesthetic Topical Lidocaine 4% cream to wound bed prior to debridement 3. Peri-wound Care: Barrier cream 5. Secondary Dressing Applied ABD Pad Foam 7. Secured with 3 Layer Compression System - Left Lower Extremity Notes unna to anchor, cutimed Wellsite geologist) Signed:  04/21/2017 4:07:41 PM By: Alric Quan Entered By: Alric Quan on 04/21/2017 12:57:49 Laura [name], [name] ([id number]) -------------------------------------------------------------------------------- Vitals Details Patient Name: Laura [name], [name] Date of  Service: 04/21/2017 12:30 PM Medical Record Number: [id number] Patient Account Number: [id number] Date of Birth/Sex: Apr 30, 1925 (83 y.o. Female) Treating RN: Carolyne Fiscal, Debi Primary Care Truett Mcfarlan: Halina Maidens Other Clinician: Referring Aliceson Dolbow: Halina Maidens Treating Mechele Kittleson/Extender: Frann Rider in Treatment: 16 Vital Signs Time Taken: 12:47 Temperature (F): 98.5 Height (in): 63 Pulse (bpm): 79 Weight (lbs): 164.8 Respiratory Rate (breaths/min): 16 Body Mass Index (BMI): 29.2 Blood Pressure (mmHg): 142/79 Reference Range: 80 - 120 mg / dl Electronic Signature(s) Signed: 04/21/2017 4:07:41 PM By: Alric Quan Entered By: Alric Quan on 04/21/2017 12:49:03

## 2017-04-25 ENCOUNTER — Ambulatory Visit: Payer: Medicare PPO | Admitting: Gastroenterology

## 2017-04-28 ENCOUNTER — Encounter: Payer: Medicare PPO | Admitting: Surgery

## 2017-04-28 DIAGNOSIS — E11622 Type 2 diabetes mellitus with other skin ulcer: Secondary | ICD-10-CM | POA: Diagnosis not present

## 2017-04-30 NOTE — Progress Notes (Addendum)
[place], [name] ([id number]) Visit Report for 04/28/2017 Chief Complaint Document Details Patient Name: Laura [name], [name] Date of Service: 04/28/2017 2:15 PM Medical Record Number: [id number] Patient Account Number: [id number] Date of Birth/Sex: Mar 14, 1925 (83 y.o. Female) Treating RN: Ahmed Prima Primary Care Provider: Halina Maidens Other Clinician: Referring Provider: Halina Maidens Treating Provider/Extender: Frann Rider in Treatment: 17 Information Obtained from: Patient Chief Complaint Left lower leg cellulitis Electronic Signature(s) Signed: 04/28/2017 3:11:54 PM By: Christin Fudge MD, FACS Entered By: Christin Fudge on 04/28/2017 15:11:54 [place], [name] ([id number]) -------------------------------------------------------------------------------- HPI Details Patient Name: Laura [name], [name] Date of Service: 04/28/2017 2:15 PM Medical Record Number: [id number] Patient Account Number: [id number] Date of Birth/Sex: 1925/01/22 (83 y.o. Female) Treating RN: Carolyne Fiscal, Debi Primary Care Provider: Halina Maidens Other Clinician: Referring Provider: Halina Maidens Treating Provider/Extender: Frann Rider in Treatment: 17 History of Present Illness HPI Description: 12/30/16 Patient presents today for initial evaluation concerning an area over her left lateral ankle which she tells me has been intermittent in nature since 2000. However the current opening has been present for about two weeks. She has been tolerating the dressing changes at home with antibiotic ointment but that is really the only treatment that has been initiated at this point. That is other than the oral antibiotics which was Keflex that patient was placed on by her primary care provider prior to being referred to [place]. She does have some discomfort but rates this to be around a 2-3 out of 10 fortunately the redness does not seem to be spreading to any other location as far as her lower extremity is concerned. She does tell  me that in the past antibiotics have been beneficial for her unfortunately the current antibiotics have not been of benefit and no wound culture was performed as of yet. She has previously seen Dr. 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 She does have diabetes and are most recent blood sugars have run between 104 and 106 according to patient's although her last hemoglobin A1c she is unaware of. Patient's current wound does not appear to be significantly open but is rather more of a weeping region of cellulitis. 01/27/17 on evaluation today patient left lower for many wound continues to show signs of erythema surrounding and in fact she is noted to have erythema from her toes up to just below the knee in regard to left lower extremity. She is not having any fevers, chills, nausea, vomiting, or diarrhea at this point. With that being said she is concerned about the silver alginate dressing. She tells me that in the past she used silver and some of the dressings for previous one and did not do well with it. Nonetheless she has discomfort along the wound itself but no other pain noted throughout the left lower extremity. 02/03/17 on evaluation today patient appears to be doing fairly well overall other than the fact that her wound is worsening on the lateral aspect of her left lower extremity. It appears more macerated and even slough covered at this point. With that being said she has been attempting to perform the dressing changes on her own although I'm not sure that she is understanding exactly how this should be done. Nonetheless I do believe the gentamicin may be causing too much maceration she really has not wanted to utilize  the silver alginate dressings past due to a previous issue with this. Overall her erythema of the  left lower extremity is improved she still has significant swelling. No fevers, chills, nausea, or vomiting noted at this time. 02/10/2017 -- Old notes received -- was seen in 2009 by [facility] by Dr. Romie Levee -- his impression was chronic patches of dermatitis on the left lower extremity and current mild swelling. He recommended venous insufficiency venous reflux studies. She had no evidence of arterial insufficiency. Follow-up chronic venous insufficiency study done on 12/16/2007, showed very mild amount of reflux on the great saphenous vein at the knee but the function of the vein is normal and there was no evidence by the venous reflux study.. They referred her to dermatology for a second opinion and would see her back in about 4-6 weeks. She was seen back in follow-up in January 01 2008 and at that time she was asked to use a steroid ointment locally and the wounds are almost completely healed. The patient has told me today that she does not tolerate silver dressing -- and she has not had home health come and change her dressings over the last week. 02/17/2017 -- - had a lower arterial study done which showed a bilateral ABI suggests no significant lower extremity arterial disease and the toe brachial indicis were normal on the right and abnormal on the left. The right ABI was 1.0 and the left was 1.07. The right toe brachial indices was 0.81 on the right and 0.56 on the left. She had biphasic and triphasic flow at the tibial vessels. 02/24/2017 -- the patient has been tolerating a 3 layer wrap and seems to be pleased with her progress 03/17/17 on evaluation today patient appears to be doing better in regard to her left lateral lower extremity wound. She has been Tolerating the wraps without complication. No fevers, chills, nausea, or vomiting noted at this time. She is pleased with Engelmann, [name] ([id number]) how things are progressing. Early complaint is that she is unable to take a  shower for such a long time from Wednesday all the way till the following Monday. 03/31/2017 - the patient and her nurse were concerned about the redness of the wound and the discharge from the wound but other than that everything else has been okay and she has been doing fairly well. She has no constitutional symptoms Electronic Signature(s) Signed: 04/28/2017 3:11:57 PM By: Christin Fudge MD, FACS Entered By: Christin Fudge on 04/28/2017 15:11:57 [place], Zanaria F. ([id number]) -------------------------------------------------------------------------------- Physical Exam Details Patient Name: Laura [name], [name] Date of Service: 04/28/2017 2:15 PM Medical Record Number: [id number] Patient Account Number: [id number] Date of Birth/Sex: Nov 16, 1924 (83 y.o. Female) Treating RN: Ahmed Prima Primary Care Provider: Halina Maidens Other Clinician: Referring Provider: Halina Maidens Treating Provider/Extender: Frann Rider in Treatment: 17 Constitutional . Pulse regular. Respirations normal and unlabored. Afebrile. . Eyes Nonicteric. Reactive to light. Ears, Nose, Mouth, and Throat Lips, teeth, and gums WNL.Marland Kitchen Moist mucosa without lesions. Neck supple and nontender. No palpable supraclavicular or cervical adenopathy. Normal sized without goiter. Respiratory WNL. No retractions.. Cardiovascular Pedal Pulses WNL. No clubbing, cyanosis or edema. Lymphatic No adneopathy. No adenopathy. No adenopathy. Musculoskeletal Adexa without tenderness or enlargement.. Digits and nails w/o clubbing, cyanosis, infection, petechiae, ischemia, or inflammatory conditions.. Integumentary (Hair, Skin) No suspicious lesions. No crepitus or fluctuance. No peri-wound warmth or erythema. No masses.Marland Kitchen Psychiatric Judgement and insight Intact.. No evidence of depression, anxiety, or agitation.. Notes the lymphedema  has gone up bilaterally and the wound has some element of fungal infection around the main ulcerated  area. There is no cellulitis Electronic Signature(s) Signed: 04/28/2017 3:12:37 PM By: Christin Fudge MD, FACS Entered By: Christin Fudge on 04/28/2017 15:12:35 Ng, Everette F. ([id number]) -------------------------------------------------------------------------------- Physician Orders Details Patient Name: Mckinstry, Arianis F. Date of Service: 04/28/2017 2:15 PM Medical Record Number: [id number] Patient Account Number: [id number] Date of Birth/Sex: 1924/10/06 (83 y.o. Female) Treating RN: Carolyne Fiscal, Debi Primary Care Provider: Halina Maidens Other Clinician: Referring Provider: Halina Maidens Treating Provider/Extender: Frann Rider in Treatment: 52 Verbal / Phone Orders: Yes Clinician: Carolyne Fiscal, Debi Read Back and Verified: Yes Diagnosis Coding Wound Cleansing Wound #1 Left,Lateral Lower Leg o Clean wound with wound cleanser. o Cleanse wound with mild soap and water Anesthetic Wound #1 Left,Lateral Lower Leg o Topical Lidocaine 4% cream applied to wound bed prior to debridement Skin Barriers/Peri-Wound Care Wound #1 Left,Lateral Lower Leg o Antifungal cream - to red areas around wound (do not place on the ulcer itself) Primary Wound Dressing Wound #1 Left,Lateral Lower Leg o Cutimed Sorbact - (with foam over sorbact) Secondary Dressing Wound #1 Left,Lateral Lower Leg o ABD pad o Foam o Drawtex Dressing Change Frequency o Change Dressing Monday, Wednesday, Friday - HHRN to go change dressing Monday, Wednesday and Friday the week of 03/20/17 - 03/24/17 and resume to normal days Monday and Wednesday on the week after Follow-up Appointments Wound #1 Left,Lateral Lower Leg o Return Appointment in 1 week. Edema Control Wound #1 Left,Lateral Lower Leg o 3 Layer Compression System - Left Lower Extremity - Please wrap 3cm from toes and 3cm from knee. unna to anchor o Elevate legs to the level of the heart and pump ankles as often as possible Additional  Orders / Instructions Wound #1 Left,Lateral Lower Leg o Increase protein intake. Demonte, Yanil F. ([id number]) Home Health Wound #1 Left,Lateral Lower Leg o [place] Visits - HHRN to change dressing Monday and Wednesday. Please order these supplies for the pt. o Home Health Nurse may visit PRN to address patientos wound care needs. o FACE TO FACE ENCOUNTER: MEDICARE and MEDICAID PATIENTS: I certify that this patient is under my care and that I had a face-to-face encounter that meets the physician face-to-face encounter requirements with this patient on this date. The encounter with the patient was in whole or in part for the following MEDICAL CONDITION: (primary reason for [place]) MEDICAL NECESSITY: I certify, that based on my findings, NURSING services are a medically necessary home health service. HOME BOUND STATUS: I certify that my clinical findings support that this patient is homebound (i.e., Due to illness or injury, pt requires aid of supportive devices such as crutches, cane, wheelchairs, walkers, the use of special transportation or the assistance of another person to leave their place of residence. There is a normal inability to leave the home and doing so requires considerable and taxing effort. Other absences are for medical reasons / religious services and are infrequent or of short duration when for other reasons). o If current dressing causes regression in wound condition, may D/C ordered dressing product/s and apply Normal Saline Moist Dressing daily until next [place] / Other MD appointment. [place] of regression in wound condition at [phone]. o Please direct any NON-WOUND related issues/requests for orders to patient's Primary Care Physician Medications-please add to medication list. Wound #1 Left,Lateral Lower Leg o Other: - Vitamin A, Vitamin C, Zinc, MVI Patient Medications Allergies:  Augmentin,  calcium channel blockers, nitrofurantoin, ACE Inhibitors, atorvastatin, Beta-Blockers (Beta-Adrenergic Blocking Agts), Levaquin, pravastatin, Statins-Hmg-Coa Reductase Inhibitors, sulfa, clindamycin, lincomycin, SILVER Notifications Medication Indication Start End econazole 04/28/2017 DOSE topical 1 % cream - cream topical as directed Electronic Signature(s) Signed: 04/28/2017 3:11:13 PM By: Christin Fudge MD, FACS Entered By: Christin Fudge on 04/28/2017 15:11:12 Bissonette, Karishma F. ([id number]) -------------------------------------------------------------------------------- Problem List Details Patient Name: Koffman, Zenya F. Date of Service: 04/28/2017 2:15 PM Medical Record Number: [id number] Patient Account Number: [id number] Date of Birth/Sex: Jun 20, 1925 (83 y.o. Female) Treating RN: Carolyne Fiscal, Debi Primary Care Provider: Halina Maidens Other Clinician: Referring Provider: Halina Maidens Treating Provider/Extender: Frann Rider in Treatment: 17 Active Problems ICD-10 Encounter Code Description Active Date Diagnosis E11.622 Type 2 diabetes mellitus with other skin ulcer 01/02/2017 Yes I10 Essential (primary) hypertension 01/02/2017 Yes I89.0 Lymphedema, not elsewhere classified 01/16/2017 Yes Inactive Problems Resolved Problems ICD-10 Code Description Active Date Resolved Date L03.116 Cellulitis of left lower limb 01/02/2017 01/02/2017 Electronic Signature(s) Signed: 04/28/2017 3:11:43 PM By: Christin Fudge MD, FACS Entered By: Christin Fudge on 04/28/2017 15:11:43 [place], Kerline F. ([id number]) -------------------------------------------------------------------------------- Progress Note Details Patient Name: Brownlee, Vani F. Date of Service: 04/28/2017 2:15 PM Medical Record Number: [id number] Patient Account Number: [id number] Date of Birth/Sex: December 07, 1924 (83 y.o. Female) Treating RN: Carolyne Fiscal, Debi Primary Care Provider: Halina Maidens Other Clinician: Referring Provider: Halina Maidens Treating Provider/Extender: Frann Rider in Treatment: 17 Subjective Chief Complaint Information obtained from Patient Left lower leg cellulitis History of Present Illness (HPI) 12/30/16 Patient presents today for initial evaluation concerning an area over her left lateral ankle which she tells me has been intermittent in nature since 2000. However the current opening has been present for about two weeks. She has been tolerating the dressing changes at home with antibiotic ointment but that is really the only treatment that has been initiated at this point. That is other than the oral antibiotics which was Keflex that patient was placed on by her primary care provider prior to being referred to [place]. She does have some discomfort but rates this to be around a 2-3 out of 10 fortunately the redness does not seem to be spreading to any other location as far as her lower extremity is concerned. She does tell me that in the past antibiotics have been beneficial for her unfortunately the current antibiotics have not been of benefit and no wound culture was performed as of yet. She has previously seen Dr. 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 She does have diabetes and are most recent blood sugars have run between 104 and 106 according to patient's although her last hemoglobin A1c she is unaware of. Patient's current wound does not appear to be significantly open but is rather more of a weeping region of cellulitis. 01/27/17 on evaluation today patient left lower for many wound continues to show signs of erythema surrounding and in fact she is noted to have erythema from her toes up to just below the knee in regard to left lower extremity. She is not having any fevers, chills, nausea, vomiting, or diarrhea at this  point. With that being said she is concerned about the silver alginate dressing. She tells me that in the past she used silver and some of the dressings for previous one and did not do well with it. Nonetheless  she has discomfort along the wound itself but no other pain noted throughout the left lower extremity. 02/03/17 on evaluation today patient appears to be doing fairly well overall other than the fact that her wound is worsening on the lateral aspect of her left lower extremity. It appears more macerated and even slough covered at this point. With that being said she has been attempting to perform the dressing changes on her own although I'm not sure that she is understanding exactly how this should be done. Nonetheless I do believe the gentamicin may be causing too much maceration she really has not wanted to utilize the silver alginate dressings past due to a previous issue with this. Overall her erythema of the left lower extremity is improved she still has significant swelling. No fevers, chills, nausea, or vomiting noted at this time. 02/10/2017 -- Old notes received -- was seen in 2009 by [facility] by Dr. Romie Levee -- his impression was chronic patches of dermatitis on the left lower extremity and current mild swelling. He recommended venous insufficiency venous reflux studies. She had no evidence of arterial insufficiency. Follow-up chronic venous insufficiency study done on 12/16/2007, showed very mild amount of reflux on the great saphenous vein at the knee but the function of the vein is normal and there was no evidence by the venous reflux study.. They referred her to dermatology for a second opinion and would see her back in about 4-6 weeks. She was seen back in follow-up in January 01 2008 and at that time she was asked to use a steroid ointment locally and the wounds are almost completely healed. The patient has told me today that she does not tolerate silver dressing -- and  she has not had home health come and change her dressings over the last week. 02/17/2017 -- - had a lower arterial study done which showed a bilateral ABI suggests no significant lower extremity arterial disease and the toe brachial indicis were normal on the right and abnormal on the left. The right ABI was 1.0 and the left was 1.07. The right toe brachial indices was 0.81 on the right and 0.56 on the left. She had biphasic and triphasic flow at the tibial [place], Keyleen F. ([id number]) vessels. 02/24/2017 -- the patient has been tolerating a 3 layer wrap and seems to be pleased with her progress 03/17/17 on evaluation today patient appears to be doing better in regard to her left lateral lower extremity wound. She has been Tolerating the wraps without complication. No fevers, chills, nausea, or vomiting noted at this time. She is pleased with how things are progressing. Early complaint is that she is unable to take a shower for such a long time from Wednesday all the way till the following Monday. 03/31/2017 - the patient and her nurse were concerned about the redness of the wound and the discharge from the wound but other than that everything else has been okay and she has been doing fairly well. She has no constitutional symptoms Patient History Information obtained from Patient. Family History Cancer - Siblings,Child,Maternal Grandparents, Diabetes - Mother,Siblings, Heart Disease - Father, Hypertension - Siblings, Stroke - Father,Mother, Thyroid Problems - Child, No family history of Hereditary Spherocytosis, Kidney Disease, Lung Disease, Seizures, Tuberculosis. Social History Former smoker - smoked late teens early 43s, Marital Status - Widowed, Alcohol Use - Never, Drug Use - No History, Caffeine Use - Daily. Objective Constitutional Pulse regular. Respirations normal and unlabored. Afebrile. Vitals Time Taken: 2:37 PM,  Height: 63 in, Weight: 164.8 lbs, BMI: 29.2, Temperature: 98.3 F,  Pulse: 79 bpm, Respiratory Rate: 16 breaths/min, Blood Pressure: 131/81 mmHg. Eyes Nonicteric. Reactive to light. Ears, Nose, Mouth, and Throat Lips, teeth, and gums WNL.Marland Kitchen Moist mucosa without lesions. Neck supple and nontender. No palpable supraclavicular or cervical adenopathy. Normal sized without goiter. Respiratory WNL. No retractions.. Cardiovascular Pedal Pulses WNL. No clubbing, cyanosis or edema. Bracy, [name] ([id number]) Lymphatic No adneopathy. No adenopathy. No adenopathy. Musculoskeletal Adexa without tenderness or enlargement.. Digits and nails w/o clubbing, cyanosis, infection, petechiae, ischemia, or inflammatory conditions.Marland Kitchen Psychiatric Judgement and insight Intact.. No evidence of depression, anxiety, or agitation.. General Notes: the lymphedema has gone up bilaterally and the wound has some element of fungal infection around the main ulcerated area. There is no cellulitis Integumentary (Hair, Skin) No suspicious lesions. No crepitus or fluctuance. No peri-wound warmth or erythema. No masses.. Wound #1 status is Open. Original cause of wound was Gradually Appeared. The wound is located on the Left,Lateral Lower Leg. The wound measures 5cm length x 4cm width x 0.1cm depth; 15.708cm^2 area and 1.571cm^3 volume. There is Fat Layer (Subcutaneous Tissue) Exposed exposed. There is no tunneling or undermining noted. There is a large amount of serosanguineous drainage noted. The wound margin is flat and intact. There is large (67-100%) granulation within the wound bed. There is a small (1-33%) amount of necrotic tissue within the wound bed including Adherent Slough. The periwound skin appearance exhibited: Excoriation, Maceration, Erythema. The periwound skin appearance did not exhibit: Callus, Crepitus, Induration, Rash, Scarring, Dry/Scaly, Atrophie Blanche, Cyanosis, Ecchymosis, Hemosiderin Staining, Mottled, Pallor, Rubor. The surrounding wound skin color is noted with  erythema which is circumferential. Periwound temperature was noted as No Abnormality. The periwound has tenderness on palpation. Assessment Active Problems ICD-10 E11.622 - Type 2 diabetes mellitus with other skin ulcer I10 - Essential (primary) hypertension I89.0 - Lymphedema, not elsewhere classified Plan Wound Cleansing: Wound #1 Left,Lateral Lower Leg: Clean wound with wound cleanser. Cleanse wound with mild soap and water Anesthetic: Wound #1 Left,Lateral Lower Leg: Topical Lidocaine 4% cream applied to wound bed prior to debridement Skin Barriers/Peri-Wound Care: Wound #1 Left,Lateral Lower Leg: Antifungal cream - to red areas around wound (do not place on the ulcer itself) Primary Wound Dressing: Wound #1 Left,Lateral Lower Leg: Cassetta, Da F. ([id number]) Cutimed Sorbact - (with foam over sorbact) Secondary Dressing: Wound #1 Left,Lateral Lower Leg: ABD pad Foam Drawtex Dressing Change Frequency: Change Dressing Monday, Wednesday, Friday - HHRN to go change dressing Monday, Wednesday and Friday the week of 03/20/17 - 03/24/17 and resume to normal days Monday and Wednesday on the week after Follow-up Appointments: Wound #1 Left,Lateral Lower Leg: Return Appointment in 1 week. Edema Control: Wound #1 Left,Lateral Lower Leg: 3 Layer Compression System - Left Lower Extremity - Please wrap 3cm from toes and 3cm from knee. unna to anchor Elevate legs to the level of the heart and pump ankles as often as possible Additional Orders / Instructions: Wound #1 Left,Lateral Lower Leg: Increase protein intake. Home Health: Wound #1 Left,Lateral Lower Leg: Continue Home Health Visits - HHRN to change dressing Monday and Wednesday. Please order these supplies for the pt. Home Health Nurse may visit PRN to address patient s wound care needs. FACE TO FACE ENCOUNTER: MEDICARE and MEDICAID PATIENTS: I certify that this patient is under my care and that I had a face-to-face encounter that  meets the physician face-to-face encounter requirements with this patient on this date. The encounter with  the patient was in whole or in part for the following MEDICAL CONDITION: (primary reason for [place]) MEDICAL NECESSITY: I certify, that based on my findings, NURSING services are a medically necessary home health service. HOME BOUND STATUS: I certify that my clinical findings support that this patient is homebound (i.e., Due to illness or injury, pt requires aid of supportive devices such as crutches, cane, wheelchairs, walkers, the use of special transportation or the assistance of another person to leave their place of residence. There is a normal inability to leave the home and doing so requires considerable and taxing effort. Other absences are for medical reasons / religious services and are infrequent or of short duration when for other reasons). If current dressing causes regression in wound condition, may D/C ordered dressing product/s and apply Normal Saline Moist Dressing daily until next [place] / Other MD appointment. [place] of regression in wound condition at [phone]. Please direct any NON-WOUND related issues/requests for orders to patient's Primary Care Physician Medications-please add to medication list.: Wound #1 Left,Lateral Lower Leg: Other: - Vitamin A, Vitamin C, Zinc, MVI The following medication(s) was prescribed: econazole topical 1 % cream cream topical as directed starting 04/28/2017 I have recommended: 1. Sorbact with foam and a 3 layer Profore wrap to be changed 3 times a week. 2. we will use a rim of econazole around her main wound 3. She can go to [employer] blue later when her home health nurse comes to change the dressing 4. Elevation and exercise has been discussed with her 5. She will return to see [place] next week for a review. she had several questions which I have answered to her satisfaction FRANCKOWIAK, [place]  ([id number]) Electronic Signature(s) Signed: 04/28/2017 3:13:26 PM By: Christin Fudge MD, FACS Entered By: Christin Fudge on 04/28/2017 15:13:25 Ekstrand, Kalaya F. ([id number]) -------------------------------------------------------------------------------- ROS/PFSH Details Patient Name: Laura [name], [name] Date of Service: 04/28/2017 2:15 PM Medical Record Number: [id number] Patient Account Number: [id number] Date of Birth/Sex: 1924-07-06 (83 y.o. Female) Treating RN: Carolyne Fiscal, Debi Primary Care Provider: Halina Maidens Other Clinician: Referring Provider: Halina Maidens Treating Provider/Extender: Frann Rider in Treatment: 17 Information Obtained From Patient Wound History Do you currently have one or more open woundso Yes How many open wounds do you currently haveo 1 Approximately how long have you had your woundso 2 weeks How have you been treating your wound(s) until nowo clobetasol Has your wound(s) ever healed and then re-openedo Yes Have you had any lab work done in the past montho No Have you tested positive for an antibiotic resistant organism (MRSA, VRE)o No Have you tested positive for osteomyelitis (bone infection)o Yes Have you had any tests for circulation on your legso Yes Where was the test doneo [facility] Have you had other problems associated with your woundso Swelling Endocrine Medical History: Positive for: Type II Diabetes Time with diabetes: 2003 Treated with: Oral agents Immunizations Pneumococcal Vaccine: Received Pneumococcal Vaccination: Yes Implantable Devices Family and Social History Cancer: Yes - Siblings,Child,Maternal Grandparents; Diabetes: Yes - Mother,Siblings; Heart Disease: Yes - Father; Hereditary Spherocytosis: No; Hypertension: Yes - Siblings; Kidney Disease: No; Lung Disease: No; Seizures: No; Stroke: Yes - Father,Mother; Thyroid Problems: Yes - Child; Tuberculosis: No; Former smoker - smoked late teens early 57s; Marital Status - Widowed; Alcohol  Use: Never; Drug Use: No History; Caffeine Use: Daily; Financial Concerns: No; Food, Clothing or Shelter Needs: No; Support System Lacking: No; Transportation Concerns: No; Advanced Directives: No; Patient does not  want information on Advanced Directives; Do not resuscitate: No; Living Will: Yes (Not Provided); Medical Power of Attorney: Yes (Not Provided) Physician Affirmation I have reviewed and agree with the above information. Electronic Signature(s) Signed: 04/28/2017 3:39:10 PM By: Christin Fudge MD, FACS Signed: 04/28/2017 4:36:47 PM By: Alric Quan Entered By: Christin Fudge on 04/28/2017 15:12:03 Mcclatchy, Kaliyah F. ([id number]) -------------------------------------------------------------------------------- SuperBill Details Patient Name: Laura [name], [name] Date of Service: 04/28/2017 Medical Record Number: [id number] Patient Account Number: [id number] Date of Birth/Sex: [birth year] (83 y.o. Female) Treating RN: Carolyne Fiscal, Debi Primary Care Provider: Halina Maidens Other Clinician: Referring Provider: Halina Maidens Treating Provider/Extender: Frann Rider in Treatment: 17 Diagnosis Coding ICD-10 Codes Code Description E11.622 Type 2 diabetes mellitus with other skin ulcer I10 Essential (primary) hypertension I89.0 Lymphedema, not elsewhere classified Facility Procedures CPT4 Code: 66060045 Description: (Facility Use Only) [id number] - [facility] LWR LT LEG Modifier: Quantity: 1 Physician Procedures CPT4 Code: 2395320 Description: 23343 - WC PHYS LEVEL 3 - EST PT ICD-10 Diagnosis Description E11.622 Type 2 diabetes mellitus with other skin ulcer I10 Essential (primary) hypertension I89.0 Lymphedema, not elsewhere classified Modifier: Quantity: 1 Electronic Signature(s) Signed: 04/28/2017 4:09:46 PM By: Alric Quan Signed: 04/28/2017 4:38:03 PM By: Christin Fudge MD, FACS Previous Signature: 04/28/2017 3:13:45 PM Version By: Christin Fudge MD, FACS Entered By:  Alric Quan on 04/28/2017 16:09:46

## 2017-05-01 NOTE — Progress Notes (Signed)
[name], [name] ([id number]) Visit Report for 04/28/2017 Arrival Information Details Patient Name: PE, [name] Date of Service: 04/28/2017 2:15 PM Medical Record Number: [id number] Patient Account Number: [id number] Date of Birth/Sex: 1924/08/01 (83 y.o. Female) Treating RN: Carolyne Fiscal, Debi Primary Care Bulah Lurie: Halina Maidens Other Clinician: Referring Habeeb Puertas: Halina Maidens Treating Allen Basista/Extender: Frann Rider in Treatment: 63 Visit Information History Since Last Visit All ordered tests and consults were completed: No Patient Arrived: Kasandra Knudsen Added or deleted any medications: No Arrival Time: 14:25 Any new allergies or adverse reactions: No Accompanied By: son Had a fall or experienced change in No Transfer Assistance: [employer] Patient activities of daily living that may affect Lift risk of falls: Patient Identification Verified: Yes Signs or symptoms of abuse/neglect since last visito No Secondary Verification Process Yes Hospitalized since last visit: No Completed: Has Dressing in Place as Prescribed: Yes Patient Requires Transmission-Based No Precautions: Has Compression in Place as Prescribed: No Patient Has Alerts: Yes Pain Present Now: No Patient Alerts: DM II Notes pt had took her compression wrap off to shower before she came for her visit Electronic Signature(s) Signed: 04/28/2017 4:36:47 PM By: Alric Quan Entered By: Alric Quan on 04/28/2017 14:36:52 [name], [name] ([id number]) -------------------------------------------------------------------------------- Encounter Discharge Information Details [name], [name] Date of Service: 04/28/2017 2:15 PM Medical Record Number: [id number] Patient Account Number: [id number] Date of Birth/Sex: [birth year] (83 y.o. Female) Treating RN: Carolyne Fiscal, Debi Primary Care Neville Walston: Halina Maidens Other Clinician: Referring Aymee Fomby: Halina Maidens Treating Naeemah Jasmer/Extender: Frann Rider in  Treatment: 17 Encounter Discharge Information Items Discharge Pain Level: 0 Discharge Condition: Stable Ambulatory Status: Cane Discharge Destination: Home Transportation: Private Auto Accompanied By: son Schedule Follow-up Appointment: Yes Medication Reconciliation completed and No provided to Patient/Care Oneisha Ammons: Provided on Clinical Summary of Care: 04/28/2017 Form Type Recipient Paper Patient [facility] Electronic Signature(s) Signed: 05/01/2017 4:27:54 PM By: Ruthine Dose Entered By: Ruthine Dose on 04/28/2017 15:09:41 [name], [name] ([id number]) -------------------------------------------------------------------------------- Lower Extremity Assessment Details [name], [name] Date of Service: 04/28/2017 2:15 PM Medical Record Number: [id number] Patient Account Number: [id number] Date of Birth/Sex: [birth year] (83 y.o. Female) Treating RN: Carolyne Fiscal, Debi Primary Care Roselia Snipe: Halina Maidens Other Clinician: Referring Araseli Sherry: Halina Maidens Treating Arnesha Schiraldi/Extender: Frann Rider in Treatment: 17 Edema Assessment Assessed: Shirlyn Goltz: No] Patrice Paradise: No] [Left: Edema] [Right: :] Calf Left: Right: Point of Measurement: 27 cm From Medial Instep 32 cm cm Ankle Left: Right: Point of Measurement: 10 cm From Medial Instep 23 cm cm Vascular Assessment Pulses: Dorsalis Pedis Palpable: [Left:Yes] Posterior Tibial Extremity colors, hair growth, and conditions: Extremity Color: [Left:Red] Temperature of Extremity: [Left:Warm] Capillary Refill: [Left:> 3 seconds] Toe Nail Assessment Left: Right: Thick: Yes Discolored: Yes Deformed: No Improper Length and Hygiene: No Electronic Signature(s) Signed: 04/28/2017 4:36:47 PM By: Alric Quan Entered By: Alric Quan on 04/28/2017 14:43:50 [name], [name] ([id number]) -------------------------------------------------------------------------------- Multi Wound Chart Details [name], [name] Date of  Service: 04/28/2017 2:15 PM Medical Record Number: [id number] Patient Account Number: [id number] Date of Birth/Sex: Feb 27, 1925 (83 y.o. Female) Treating RN: Carolyne Fiscal, Debi Primary Care Kaheem Halleck: Halina Maidens Other Clinician: Referring Narek Kniss: Halina Maidens Treating Aeron Donaghey/Extender: Frann Rider in Treatment: 17 Vital Signs Height(in): 63 Pulse(bpm): 63 Weight(lbs): 164.8 Blood Pressure(mmHg): 131/81 Body Mass Index(BMI): 29 Temperature(F): 98.3 Respiratory Rate 16 (breaths/min): Photos: [1:No Photos] [N/A:N/A] Wound Location: [1:Left Lower Leg - Lateral] [N/A:N/A] Wounding Event: [1:Gradually Appeared] [N/A:N/A] Primary Etiology: [1:Diabetic Wound/Ulcer of the Lower Extremity] [N/A:N/A] Comorbid History: [1:Type II Diabetes] [N/A:N/A] Date Acquired: [1:12/16/2016] [  N/A:N/A] Weeks of Treatment: [1:17] [N/A:N/A] Wound Status: [1:Open] [N/A:N/A] Measurements L x W x D [1:5x4x0.1] [N/A:N/A] (cm) Area (cm) : [1:15.708] [N/A:N/A] Volume (cm) : [1:1.571] [N/A:N/A] % Reduction in Area: [1:-1499.60%] [N/A:N/A] % Reduction in Volume: [1:-1503.10%] [N/A:N/A] Classification: [1:Grade 1] [N/A:N/A] Exudate Amount: [1:Large] [N/A:N/A] Exudate Type: [1:Serosanguineous] [N/A:N/A] Exudate Color: [1:red, brown] [N/A:N/A] Wound Margin: [1:Flat and Intact] [N/A:N/A] Granulation Amount: [1:Large (67-100%)] [N/A:N/A] Necrotic Amount: [1:Small (1-33%)] [N/A:N/A] Exposed Structures: [1:Fat Layer (Subcutaneous Tissue) Exposed: Yes Fascia: No Tendon: No Muscle: No Joint: No Bone: No] [N/A:N/A] Epithelialization: [1:Small (1-33%)] [N/A:N/A] Periwound Skin Texture: [1:Excoriation: Yes Induration: No Callus: No Crepitus: No Rash: No Scarring: No] [N/A:N/A] Periwound Skin Moisture: [N/A:N/A] Maceration: Yes Dry/Scaly: No Periwound Skin Color: Erythema: Yes N/A N/A Atrophie Blanche: No Cyanosis: No Ecchymosis: No Hemosiderin Staining: No Mottled: No Pallor: No Rubor:  No Erythema Location: Circumferential N/A N/A Temperature: No Abnormality N/A N/A Tenderness on Palpation: Yes N/A N/A Wound Preparation: Ulcer Cleansing: N/A N/A Rinsed/Irrigated with Saline, Other: soap and water Topical Anesthetic Applied: Other: lidocaine 4% Treatment Notes Wound #1 (Left, Lateral Lower Leg) 1. Cleansed with: Clean wound with Normal Saline Cleanse wound with antibacterial soap and water 2. Anesthetic Topical Lidocaine 4% cream to wound bed prior to debridement 3. Peri-wound Care: Antifungal cream 5. Secondary Dressing Applied ABD Pad Foam Notes unna to anchor, cutimed sorbact, drawtex Electronic Signature(s) Signed: 04/28/2017 3:11:48 PM By: Christin Fudge MD, FACS Entered By: Christin Fudge on 04/28/2017 15:11:48 [name], [name] ([id number]) -------------------------------------------------------------------------------- [place] Details Patient Name: Azzie [name], [name] Date of Service: 04/28/2017 2:15 PM Medical Record Number: [id number] Patient Account Number: [id number] Date of Birth/Sex: [birth year] (83 y.o. Female) Treating RN: Carolyne Fiscal, Debi Primary Care Isaac Lacson: Halina Maidens Other Clinician: Referring Sunita Demond: Halina Maidens Treating Harli Engelken/Extender: Frann Rider in Treatment: 33 Active Inactive ` Abuse / Safety / Falls / Self Care Management Nursing Diagnoses: Potential for falls Goals: Patient will not experience any injury related to falls Date Initiated: 12/30/2016 Target Resolution Date: 04/29/2017 Goal Status: Active Interventions: Assess Activities of Daily Living upon admission and as needed Assess: immobility, friction, shearing, incontinence upon admission and as needed Notes: ` Nutrition Nursing Diagnoses: Imbalanced nutrition Impaired glucose control: actual or potential Potential for alteratiion in Nutrition/Potential for imbalanced nutrition Goals: Patient/caregiver will maintain therapeutic  glucose control Date Initiated: 12/30/2016 Target Resolution Date: 04/29/2017 Goal Status: Active Interventions: Assess patient nutrition upon admission and as needed per policy Notes: ` Orientation to the Wound Care Program Nursing Diagnoses: Knowledge deficit related to the wound healing center program Goals: Patient/caregiver will verbalize understanding of the [place] Date Initiated: 12/30/2016 Target Resolution Date: 01/21/2017 [name], Laura Wanda Plump ([id number]) Goal Status: Active Interventions: Provide education on orientation to the wound center Notes: ` Pain, Acute or Chronic Nursing Diagnoses: Pain, acute or chronic: actual or potential Potential alteration in comfort, pain Goals: Patient/caregiver will verbalize adequate pain control between visits Date Initiated: 12/30/2016 Target Resolution Date: 04/29/2017 Goal Status: Active Interventions: Complete pain assessment as per visit requirements Notes: ` Wound/Skin Impairment Nursing Diagnoses: Impaired tissue integrity Knowledge deficit related to smoking impact on wound healing Knowledge deficit related to ulceration/compromised skin integrity Goals: Ulcer/skin breakdown will have a volume reduction of 80% by week 12 Date Initiated: 12/30/2016 Target Resolution Date: 04/22/2017 Goal Status: Active Interventions: Assess patient/caregiver ability to perform ulcer/skin care regimen upon admission and as needed Notes: Electronic Signature(s) Signed: 04/28/2017 4:36:47 PM By: Alric Quan Entered By: Alric Quan on 04/28/2017 14:43:55 [name], [name] (  [id number]) -------------------------------------------------------------------------------- Pain Assessment Details Patient Name: DEININGER, Skylene F. Date of Service: 04/28/2017 2:15 PM Medical Record Number: [id number] Patient Account Number: [id number] Date of Birth/Sex: 04-02-1925 (83 y.o. Female) Treating RN: Carolyne Fiscal, Debi Primary Care Taimur Fier:  Halina Maidens Other Clinician: Referring Caydn Justen: Halina Maidens Treating Dorinda Stehr/Extender: Frann Rider in Treatment: 17 Active Problems Location of Pain Severity and Description of Pain Patient Has Paino No Site Locations Pain Management and Medication Current Pain Management: Electronic Signature(s) Signed: 04/28/2017 4:36:47 PM By: Alric Quan Entered By: Alric Quan on 04/28/2017 14:37:01 [name], [name] ([id number]) -------------------------------------------------------------------------------- Patient/Caregiver Education Details Patient Name: Azzie [name], Malon F. Date of Service: 04/28/2017 2:15 PM Medical Record Number: [id number] Patient Account Number: [id number] Date of Birth/Gender: Apr 09, 1925 (83 y.o. Female) Treating RN: Ahmed Prima Primary Care Physician: Halina Maidens Other Clinician: Referring Physician: Halina Maidens Treating Physician/Extender: Frann Rider in Treatment: 17 Education Assessment Education Provided To: Patient Education Topics Provided Wound/Skin Impairment: Handouts: Other: change dressing as ordered Methods: Demonstration, Explain/Verbal Responses: State content correctly Electronic Signature(s) Signed: 04/28/2017 4:36:47 PM By: Alric Quan Entered By: Alric Quan on 04/28/2017 14:47:48 [name], [name] ([id number]) -------------------------------------------------------------------------------- Wound Assessment Details Patient Name: Degroat, Jacquese F. Date of Service: 04/28/2017 2:15 PM Medical Record Number: [id number] Patient Account Number: [id number] Date of Birth/Sex: 1925/04/10 (83 y.o. Female) Treating RN: Carolyne Fiscal, Debi Primary Care Kendrick Haapala: Halina Maidens Other Clinician: Referring Catriona Dillenbeck: Halina Maidens Treating Nivaan Dicenzo/Extender: Frann Rider in Treatment: 17 Wound Status Wound Number: 1 Primary Etiology: Diabetic Wound/Ulcer of the Lower Extremity Wound Location: Left Lower Leg -  Lateral Wound Status: Open Wounding Event: Gradually Appeared Comorbid Type II Diabetes Date Acquired: 12/16/2016 History: Weeks Of Treatment: 17 Clustered Wound: No Photos Photo Uploaded By: Alric Quan on 04/28/2017 16:02:40 Wound Measurements Length: (cm) 5 Width: (cm) 4 Depth: (cm) 0.1 Area: (cm) 15.708 Volume: (cm) 1.571 % Reduction in Area: -1499.6% % Reduction in Volume: -1503.1% Epithelialization: Small (1-33%) Tunneling: No Undermining: No Wound Description Classification: Grade 1 Wound Margin: Flat and Intact Exudate Amount: Large Exudate Type: Serosanguineous Exudate Color: red, brown Foul Odor After Cleansing: No Slough/Fibrino Yes Wound Bed Granulation Amount: Large (67-100%) Exposed Structure Necrotic Amount: Small (1-33%) Fascia Exposed: No Necrotic Quality: Adherent Slough Fat Layer (Subcutaneous Tissue) Exposed: Yes Tendon Exposed: No Muscle Exposed: No Joint Exposed: No Bone Exposed: No Periwound Skin Texture Hindley, Endia F. ([id number]) Texture Color No Abnormalities Noted: No No Abnormalities Noted: No Callus: No Atrophie Blanche: No Crepitus: No Cyanosis: No Excoriation: Yes Ecchymosis: No Induration: No Erythema: Yes Rash: No Erythema Location: Circumferential Scarring: No Hemosiderin Staining: No Mottled: No Moisture Pallor: No No Abnormalities Noted: No Rubor: No Dry / Scaly: No Maceration: Yes Temperature / Pain Temperature: No Abnormality Tenderness on Palpation: Yes Wound Preparation Ulcer Cleansing: Rinsed/Irrigated with Saline, Other: soap and water, Topical Anesthetic Applied: Other: lidocaine 4%, Treatment Notes Wound #1 (Left, Lateral Lower Leg) 1. Cleansed with: Clean wound with Normal Saline Cleanse wound with antibacterial soap and water 2. Anesthetic Topical Lidocaine 4% cream to wound bed prior to debridement 3. Peri-wound Care: Antifungal cream 5. Secondary Dressing Applied ABD  Pad Foam Notes unna to anchor, cutimed sorbact, drawtex Electronic Signature(s) Signed: 04/28/2017 4:36:47 PM By: Alric Quan Entered By: Alric Quan on 04/28/2017 14:42:11 Santellan, Sweta F. ([id number]) -------------------------------------------------------------------------------- Vitals Details Patient Name: Kloss, Nivia F. Date of Service: 04/28/2017 2:15 PM Medical Record Number: [id number] Patient Account Number: [id number] Date of Birth/Sex: 24-Jun-1924 (83 y.o. Female) Treating RN: Carolyne Fiscal, Debi Primary Care Cotina Freedman: Halina Maidens Other  Clinician: Referring Shalynn Jorstad: Halina Maidens Treating Clara Herbison/Extender: Frann Rider in Treatment: 17 Vital Signs Time Taken: 14:37 Temperature (F): 98.3 Height (in): 63 Pulse (bpm): 79 Weight (lbs): 164.8 Respiratory Rate (breaths/min): 16 Body Mass Index (BMI): 29.2 Blood Pressure (mmHg): 131/81 Reference Range: 80 - 120 mg / dl Electronic Signature(s) Signed: 04/28/2017 4:36:47 PM By: Alric Quan Entered By: Alric Quan on 04/28/2017 14:37:22

## 2017-05-05 ENCOUNTER — Encounter: Payer: Medicare PPO | Admitting: Surgery

## 2017-05-05 DIAGNOSIS — E11622 Type 2 diabetes mellitus with other skin ulcer: Secondary | ICD-10-CM | POA: Diagnosis not present

## 2017-05-07 NOTE — Progress Notes (Signed)
[name], [name] ([id number]) Visit Report for 05/05/2017 Chief Complaint Document Details [name], [name] Date of Service: 05/05/2017 1:30 PM Medical Record Number: [id number] Patient Account Number: [id number] Date of Birth/Sex: [birth year] (83 y.o. Female) Treating RN: Ahmed Prima Primary Care Provider: Halina Maidens Other Clinician: Referring Provider: Halina Maidens Treating Provider/Extender: Frann Rider in Treatment: 18 Information Obtained from: Patient Chief Complaint Left lower leg cellulitis Electronic Signature(s) Signed: 05/05/2017 2:15:05 PM By: Christin Fudge MD, FACS Entered By: Christin Fudge on 05/05/2017 14:15:05 [name], [name] ([id number]) -------------------------------------------------------------------------------- HPI Details [name], [name] Date of Service: 05/05/2017 1:30 PM Medical Record Number: [id number] Patient Account Number: [id number] Date of Birth/Sex: March 18, 1925 (83 y.o. Female) Treating RN: Carolyne Fiscal, Debi Primary Care Provider: Halina Maidens Other Clinician: Referring Provider: Halina Maidens Treating Provider/Extender: Frann Rider in Treatment: 18 History of Present Illness HPI Description: 12/30/16 Patient presents today for initial evaluation concerning an area over her left lateral ankle which she tells me has been intermittent in nature since 2000. However the current opening has been present for about two weeks. She has been tolerating the dressing changes at home with antibiotic ointment but that is really the only treatment that has been initiated at this point. That is other than the oral antibiotics which was Keflex that patient was placed on by her primary care provider prior to being referred to [place]. She does have some discomfort but rates this to be around a 2-3 out of 10 fortunately the redness does not seem to be spreading to any other location as far as her lower extremity is concerned. She does  tell me that in the past antibiotics have been beneficial for her unfortunately the current antibiotics have not been of benefit and no wound culture was performed as of yet. She has previously seen Dr. 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 She does have diabetes and are most recent blood sugars have run between 104 and 106 according to patient's although her last hemoglobin A1c she is unaware of. Patient's current wound does not appear to be significantly open but is rather more of a weeping region of cellulitis. 01/27/17 on evaluation today patient left lower for many wound continues to show signs of erythema surrounding and in fact she is noted to have erythema from her toes up to just below the knee in regard to left lower extremity. She is not having any fevers, chills, nausea, vomiting, or diarrhea at this point. With that being said she is concerned about the silver alginate dressing. She tells me that in the past she used silver and some of the dressings for previous one and did not do well with it. Nonetheless she has discomfort along the wound itself but no other pain noted throughout the left lower extremity. 02/03/17 on evaluation today patient appears to be doing fairly well overall other than the fact that her wound is worsening on the lateral aspect of her left lower extremity. It appears more macerated and even slough covered at this point. With that being said she has been attempting to perform the dressing changes on her own although I'm not sure that she is understanding exactly how this should be done. Nonetheless I do believe the gentamicin may be causing too much maceration she really has not wanted to utilize  the silver alginate dressings past due to a previous issue with this. Overall her erythema  of the left lower extremity is improved she still has significant swelling. No fevers, chills, nausea, or vomiting noted at this time. 02/10/2017 -- Old notes received -- was seen in 2009 by [facility] by Dr. Romie Levee -- his impression was chronic patches of dermatitis on the left lower extremity and current mild swelling. He recommended venous insufficiency venous reflux studies. She had no evidence of arterial insufficiency. Follow-up chronic venous insufficiency study done on 12/16/2007, showed very mild amount of reflux on the great saphenous vein at the knee but the function of the vein is normal and there was no evidence by the venous reflux study.. They referred her to dermatology for a second opinion and would see her back in about 4-6 weeks. She was seen back in follow-up in January 01 2008 and at that time she was asked to use a steroid ointment locally and the wounds are almost completely healed. The patient has told me today that she does not tolerate silver dressing -- and she has not had home health come and change her dressings over the last week. 02/17/2017 -- - had a lower arterial study done which showed a bilateral ABI suggests no significant lower extremity arterial disease and the toe brachial indicis were normal on the right and abnormal on the left. The right ABI was 1.0 and the left was 1.07. The right toe brachial indices was 0.81 on the right and 0.56 on the left. She had biphasic and triphasic flow at the tibial vessels. 02/24/2017 -- the patient has been tolerating a 3 layer wrap and seems to be pleased with her progress 03/17/17 on evaluation today patient appears to be doing better in regard to her left lateral lower extremity wound. She has been Tolerating the wraps without complication. No fevers, chills, nausea, or vomiting noted at this time. She is pleased with Laura [name], [name] ([id number]) how things are progressing. Early complaint is that she is unable to take  a shower for such a long time from Wednesday all the way till the following Monday. 03/31/2017 - the patient and her nurse were concerned about the redness of the wound and the discharge from the wound but other than that everything else has been okay and she has been doing fairly well. She has no constitutional symptoms 05/05/2017 -- she has made excellent progress and overall is very pleased. I believe if she continues at this rate we will be able to put her in her own compression stockings soon Electronic Signature(s) Signed: 05/05/2017 2:15:48 PM By: Christin Fudge MD, FACS Entered By: Christin Fudge on 05/05/2017 14:15:48 [name], [name] ([id number]) -------------------------------------------------------------------------------- Physical Exam Details [name], [name] Date of Service: 05/05/2017 1:30 PM Medical Record Number: [id number] Patient Account Number: [id number] Date of Birth/Sex: [birth year] (83 y.o. Female) Treating RN: Ahmed Prima Primary Care Provider: Halina Maidens Other Clinician: Referring Provider: Halina Maidens Treating Provider/Extender: Frann Rider in Treatment: 18 Constitutional . Pulse regular. Respirations normal and unlabored. Afebrile. . Eyes Nonicteric. Reactive to light. Ears, Nose, Mouth, and Throat Lips, teeth, and gums WNL.Marland Kitchen Moist mucosa without lesions. Neck supple and nontender. No palpable supraclavicular or cervical adenopathy. Normal sized without goiter. Respiratory WNL. No retractions.. Cardiovascular Pedal Pulses WNL. No clubbing, cyanosis or edema. Lymphatic No adneopathy. No adenopathy. No adenopathy. Musculoskeletal Adexa without tenderness or enlargement.. Digits and nails w/o clubbing, cyanosis, infection, petechiae, ischemia, or inflammatory  conditions.. Integumentary (Hair, Skin) No suspicious lesions. No crepitus or fluctuance. No peri-wound warmth or erythema. No masses.Marland Kitchen Psychiatric Judgement and insight  Intact.. No evidence of depression, anxiety, or agitation.. Notes the lymphedema has gone down very well and her fungal infection is well controlled. The main ulcerated area is looking very good overall and is healing nicely. Electronic Signature(s) Signed: 05/05/2017 2:16:37 PM By: Christin Fudge MD, FACS Entered By: Christin Fudge on 05/05/2017 14:16:36 [name], [name] ([id number]) -------------------------------------------------------------------------------- Physician Orders Details [name], [name] Date of Service: 05/05/2017 1:30 PM Medical Record Number: [id number] Patient Account Number: [id number] Date of Birth/Sex: [birth year] (83 y.o. Female) Treating RN: Carolyne Fiscal, Debi Primary Care Provider: Halina Maidens Other Clinician: Referring Provider: Halina Maidens Treating Provider/Extender: Frann Rider in Treatment: 19 Verbal / Phone Orders: Yes Clinician: Carolyne Fiscal, Debi Read Back and Verified: Yes Diagnosis Coding Wound Cleansing Wound #1 Left,Lateral Lower Leg o Clean wound with wound cleanser. o Cleanse wound with mild soap and water o Other: - pt may take wrap off and shower before nurse comes or before she comes to clinic Anesthetic Wound #1 Left,Lateral Lower Leg o Topical Lidocaine 4% cream applied to wound bed prior to debridement Skin Barriers/Peri-Wound Care Wound #1 Left,Lateral Lower Leg o Antifungal cream - to red areas around wound (do not place on the ulcer itself) Primary Wound Dressing Wound #1 Left,Lateral Lower Leg o Cutimed Sorbact - (with foam over sorbact) Secondary Dressing Wound #1 Left,Lateral Lower Leg o ABD pad o Foam o Drawtex Dressing Change Frequency o Change Dressing Monday, Wednesday, Friday - HHRN to go change dressing Monday, Wednesday and Friday the week of 05/08/17 - 05/12/17 and resume to normal days Monday and Wednesday on the week after [facility] will be closed Thursday and  Friday Follow-up Appointments Wound #1 Left,Lateral Lower Leg o Return Appointment in 2 weeks. Edema Control Wound #1 Left,Lateral Lower Leg o 3 Layer Compression System - Left Lower Extremity - Please wrap 3cm from toes and 3cm from knee. unna to anchor o Elevate legs to the level of the heart and pump ankles as often as possible Additional Orders / Instructions Wound #1 Left,Lateral Lower Leg Lindseth, Syann F. ([id number]) o Increase protein intake. Home Health Wound #1 [place] [facility] to go change dressing Monday, Wednesday and Friday the week of 05/08/17 - 05/12/17 Wound Clinic will be closed Thursday and Friday Please order these supplies for the pt. o Home Health Nurse may visit PRN to address patientos wound care needs. o FACE TO FACE ENCOUNTER: MEDICARE and MEDICAID PATIENTS: I certify that this patient is under my care and that I had a face-to-face encounter that meets the physician face-to-face encounter requirements with this patient on this date. The encounter with the patient was in whole or in part for the following MEDICAL CONDITION: (primary reason for [place]) MEDICAL NECESSITY: I certify, that based on my findings, NURSING services are a medically necessary home health service. HOME BOUND STATUS: I certify that my clinical findings support that this patient is homebound (i.e., Due to illness or injury, pt requires aid of supportive devices such as crutches, cane, wheelchairs, walkers, the use of special transportation or the assistance of another person to leave their place of residence. There is a normal inability to leave the home and doing so requires considerable and taxing effort. Other absences are for medical reasons / religious services and are infrequent or of short duration when for  other reasons). o If current dressing causes regression in wound condition, may D/C ordered dressing product/s and  apply Normal Saline Moist Dressing daily until next [place] / Other MD appointment. [place] of regression in wound condition at [phone]. o Please direct any NON-WOUND related issues/requests for orders to patient's Primary Care Physician Medications-please add to medication list. Wound #1 Left,Lateral Lower Leg o Other: - Vitamin A, Vitamin C, Zinc, MVI Patient Medications Allergies: Augmentin, calcium channel blockers, nitrofurantoin, ACE Inhibitors, atorvastatin, Beta-Blockers (Beta-Adrenergic Blocking Agts), Levaquin, pravastatin, Statins-Hmg-Coa Reductase Inhibitors, sulfa, clindamycin, lincomycin, SILVER Notifications Medication Indication Start End econazole 05/05/2017 DOSE topical 1 % cream - cream topical as directed Notes HHRN to go change dressing Monday, Wednesday and Friday the week of 05/08/17 - 05/12/17 and resume to normal days Monday and Wednesday on the week after Wound Clinic will be closed Thursday and Friday Electronic Signature(s) Signed: 05/05/2017 2:18:49 PM By: Christin Fudge MD, FACS Entered By: Christin Fudge on 05/05/2017 14:18:48 [name], [name] ([id number]) -------------------------------------------------------------------------------- Problem List Details Patient Name: Cortopassi, Irem F. Date of Service: 05/05/2017 1:30 PM Medical Record Number: [id number] Patient Account Number: [id number] Date of Birth/Sex: Dec 07, 1924 (83 y.o. Female) Treating RN: Carolyne Fiscal, Debi Primary Care Provider: Halina Maidens Other Clinician: Referring Provider: Halina Maidens Treating Provider/Extender: Frann Rider in Treatment: 44 Active Problems ICD-10 Encounter Code Description Active Date Diagnosis E11.622 Type 2 diabetes mellitus with other skin ulcer 01/02/2017 Yes I10 Essential (primary) hypertension 01/02/2017 Yes I89.0 Lymphedema, not elsewhere classified 01/16/2017 Yes Inactive Problems Resolved Problems ICD-10 Code  Description Active Date Resolved Date L03.116 Cellulitis of left lower limb 01/02/2017 01/02/2017 Electronic Signature(s) Signed: 05/05/2017 2:14:52 PM By: Christin Fudge MD, FACS Entered By: Christin Fudge on 05/05/2017 14:14:52 [name], [name] ([id number]) -------------------------------------------------------------------------------- Progress Note Details [name], [name] Date of Service: 05/05/2017 1:30 PM Medical Record Number: [id number] Patient Account Number: [id number] Date of Birth/Sex: [birth year] (83 y.o. Female) Treating RN: Carolyne Fiscal, Debi Primary Care Provider: Halina Maidens Other Clinician: Referring Provider: Halina Maidens Treating Provider/Extender: Frann Rider in Treatment: 59 Subjective Chief Complaint Information obtained from Patient Left lower leg cellulitis History of Present Illness (HPI) 12/30/16 Patient presents today for initial evaluation concerning an area over her left lateral ankle which she tells me has been intermittent in nature since 2000. However the current opening has been present for about two weeks. She has been tolerating the dressing changes at home with antibiotic ointment but that is really the only treatment that has been initiated at this point. That is other than the oral antibiotics which was Keflex that patient was placed on by her primary care provider prior to being referred to [place]. She does have some discomfort but rates this to be around a 2-3 out of 10 fortunately the redness does not seem to be spreading to any other location as far as her lower extremity is concerned. She does tell me that in the past antibiotics have been beneficial for her unfortunately the current antibiotics have not been of benefit and no wound culture was performed as of yet. She has previously seen Dr. 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 She does have diabetes and are most recent blood sugars have run between 104 and 106 according to patient's although her last hemoglobin A1c she is  unaware of. Patient's current wound does not appear to be significantly open but is rather more of a weeping region of cellulitis. 01/27/17 on evaluation today patient left lower for many wound continues to show signs of erythema surrounding and in fact she is noted to have erythema from her toes up to just below the knee in regard to left lower extremity. She is not having any fevers, chills, nausea, vomiting, or diarrhea at this point. With that being said she is concerned about the silver alginate dressing. She tells me that in the past she used silver and some of the dressings for previous one and did not do well with it. Nonetheless she has discomfort along the wound itself but no other pain noted throughout the left lower extremity. 02/03/17 on evaluation today patient appears to be doing fairly well overall other than the fact that her wound is worsening on the lateral aspect of her left lower extremity. It appears more macerated and even slough covered at this point. With that being said she has been attempting to perform the dressing changes on her own although I'm not sure that she is understanding exactly how this should be done. Nonetheless I do believe the gentamicin may be causing too much maceration she really has not wanted to utilize the silver alginate dressings past due to a previous issue with this. Overall her erythema of the left lower extremity is improved she still has significant swelling. No fevers, chills, nausea, or vomiting noted at this time. 02/10/2017 -- Old notes received -- was seen in 2009 by [facility] by Dr. Romie Levee -- his impression was chronic patches of dermatitis on the left lower extremity and current mild swelling. He recommended  venous insufficiency venous reflux studies. She had no evidence of arterial insufficiency. Follow-up chronic venous insufficiency study done on 12/16/2007, showed very mild amount of reflux on the great saphenous vein at the knee but the function of the vein is normal and there was no evidence by the venous reflux study.. They referred her to dermatology for a second opinion and would see her back in about 4-6 weeks. She was seen back in follow-up in January 01 2008 and at that time she was asked to use a steroid ointment locally and the wounds are almost completely healed. The patient has told me today that she does not tolerate silver dressing -- and she has not had home health come and change her dressings over the last week. 02/17/2017 -- - had a lower arterial study done which showed a bilateral ABI suggests no significant lower extremity arterial disease and the toe brachial indicis were normal on the right and abnormal on the left. The right ABI was 1.0 and the left was 1.07. The right toe brachial indices was 0.81 on the right and 0.56 on the left. She had biphasic and triphasic flow at the tibial Libman, Avonda F. ([id number]) vessels. 02/24/2017 -- the patient has been tolerating a 3 layer wrap and seems to be pleased with her progress 03/17/17 on evaluation today patient appears to be doing better in regard to her left lateral lower extremity wound. She has been Tolerating the wraps without complication. No fevers, chills, nausea, or vomiting noted at this time. She is pleased with how things are progressing. Early complaint is that she is unable to take a shower for such a long time from Wednesday all the way till the following Monday. 03/31/2017 - the patient and her nurse were concerned  about the redness of the wound and the discharge from the wound but other than that everything else has been okay and she has been doing fairly well. She has no constitutional symptoms 05/05/2017 -- she has  made excellent progress and overall is very pleased. I believe if she continues at this rate we will be able to put her in her own compression stockings soon Patient History Information obtained from Patient. Family History Cancer - Siblings,Child,Maternal Grandparents, Diabetes - Mother,Siblings, Heart Disease - Father, Hypertension - Siblings, Stroke - Father,Mother, Thyroid Problems - Child, No family history of Hereditary Spherocytosis, Kidney Disease, Lung Disease, Seizures, Tuberculosis. Social History Former smoker - smoked late teens early 57s, Marital Status - Widowed, Alcohol Use - Never, Drug Use - No History, Caffeine Use - Daily. Objective Constitutional Pulse regular. Respirations normal and unlabored. Afebrile. Vitals Time Taken: 1:46 PM, Height: 63 in, Weight: 164.8 lbs, BMI: 29.2, Temperature: 97.8 F, Pulse: 73 bpm, Respiratory Rate: 16 breaths/min, Blood Pressure: 157/91 mmHg. Eyes Nonicteric. Reactive to light. Ears, Nose, Mouth, and Throat Lips, teeth, and gums WNL.Marland Kitchen Moist mucosa without lesions. Neck supple and nontender. No palpable supraclavicular or cervical adenopathy. Normal sized without goiter. Respiratory WNL. No retractions.. Cardiovascular Snell, Macala F. ([id number]) Pedal Pulses WNL. No clubbing, cyanosis or edema. Lymphatic No adneopathy. No adenopathy. No adenopathy. Musculoskeletal Adexa without tenderness or enlargement.. Digits and nails w/o clubbing, cyanosis, infection, petechiae, ischemia, or inflammatory conditions.Marland Kitchen Psychiatric Judgement and insight Intact.. No evidence of depression, anxiety, or agitation.. General Notes: the lymphedema has gone down very well and her fungal infection is well controlled. The main ulcerated area is looking very good overall and is healing nicely. Integumentary (Hair, Skin) No suspicious lesions. No crepitus or fluctuance. No peri-wound warmth or erythema. No masses.. Wound #1 status is Open. Original  cause of wound was Gradually Appeared. The wound is located on the Left,Lateral Lower Leg. The wound measures 3cm length x 4cm width x 0.1cm depth; 9.425cm^2 area and 0.942cm^3 volume. There is Fat Layer (Subcutaneous Tissue) Exposed exposed. There is no tunneling or undermining noted. There is a large amount of serosanguineous drainage noted. The wound margin is flat and intact. There is large (67-100%) granulation within the wound bed. There is a small (1-33%) amount of necrotic tissue within the wound bed including Adherent Slough. The periwound skin appearance exhibited: Excoriation, Maceration, Erythema. The periwound skin appearance did not exhibit: Callus, Crepitus, Induration, Rash, Scarring, Dry/Scaly, Atrophie Blanche, Cyanosis, Ecchymosis, Hemosiderin Staining, Mottled, Pallor, Rubor. The surrounding wound skin color is noted with erythema which is circumferential. Periwound temperature was noted as No Abnormality. The periwound has tenderness on palpation. Assessment Active Problems ICD-10 E11.622 - Type 2 diabetes mellitus with other skin ulcer I10 - Essential (primary) hypertension I89.0 - Lymphedema, not elsewhere classified Plan Wound Cleansing: Wound #1 Left,Lateral Lower Leg: Clean wound with wound cleanser. Cleanse wound with mild soap and water Other: - pt may take wrap off and shower before nurse comes or before she comes to clinic Anesthetic: Wound #1 Left,Lateral Lower Leg: Topical Lidocaine 4% cream applied to wound bed prior to debridement Skin Barriers/Peri-Wound Care: Wound #1 Left,Lateral Lower Leg: Lagan, Titilayo F. ([id number]) Antifungal cream - to red areas around wound (do not place on the ulcer itself) Primary Wound Dressing: Wound #1 Left,Lateral Lower Leg: Cutimed Sorbact - (with foam over sorbact) Secondary Dressing: Wound #1 Left,Lateral Lower Leg: ABD pad Foam Drawtex Dressing Change Frequency: Change Dressing Monday, Wednesday, Friday - HHRN to  go change  dressing Monday, Wednesday and Friday the week of 05/08/17 - 05/12/17 and resume to normal days Monday and Wednesday on the week after Wound Clinic will be closed Thursday and Friday Follow-up Appointments: Wound #1 Left,Lateral Lower Leg: Return Appointment in 2 weeks. Edema Control: Wound #1 Left,Lateral Lower Leg: 3 Layer Compression System - Left Lower Extremity - Please wrap 3cm from toes and 3cm from knee. unna to anchor Elevate legs to the level of the heart and pump ankles as often as possible Additional Orders / Instructions: Wound #1 Left,Lateral Lower Leg: Increase protein intake. Home Health: Wound #1 Left,Lateral Lower Leg: Continue Home Health Visits - [facility] to go change dressing Monday, Wednesday and Friday the week of 05/08/17 - 05/12/17 Wound Clinic will be closed Thursday and Friday Please order these supplies for the pt. Home Health Nurse may visit PRN to address patient s wound care needs. FACE TO FACE ENCOUNTER: MEDICARE and MEDICAID PATIENTS: I certify that this patient is under my care and that I had a face-to-face encounter that meets the physician face-to-face encounter requirements with this patient on this date. The encounter with the patient was in whole or in part for the following MEDICAL CONDITION: (primary reason for [place]) MEDICAL NECESSITY: I certify, that based on my findings, NURSING services are a medically necessary home health service. HOME BOUND STATUS: I certify that my clinical findings support that this patient is homebound (i.e., Due to illness or injury, pt requires aid of supportive devices such as crutches, cane, wheelchairs, walkers, the use of special transportation or the assistance of another person to leave their place of residence. There is a normal inability to leave the home and doing so requires considerable and taxing effort. Other absences are for medical reasons / religious services and are infrequent or of short  duration when for other reasons). If current dressing causes regression in wound condition, may D/C ordered dressing product/s and apply Normal Saline Moist Dressing daily until next [place] / Other MD appointment. [place] of regression in wound condition at [phone]. Please direct any NON-WOUND related issues/requests for orders to patient's Primary Care Physician Medications-please add to medication list.: Wound #1 Left,Lateral Lower Leg: Other: - Vitamin A, Vitamin C, Zinc, MVI The following medication(s) was prescribed: econazole topical 1 % cream cream topical as directed starting 05/05/2017 General Notes: HHRN to go change dressing Monday, Wednesday and Friday the week of 05/08/17 - 05/12/17 and resume to normal days Monday and Wednesday on the week after Wound Clinic will be closed Thursday and Friday I'm well pleased with her progress over the last week and after review today, I have recommended: 1. Sorbact with foam and a 3 layer Profore wrap to be changed 3 times a week. 2. we will use a rim of econazole around her main wound 3. She can go to [employer] blue later when her home health nurse comes to change the dressing Dagley, Shere F. ([id number]) 4. Elevation and exercise has been discussed with her 5. She will return to see [place] next week for a review. she had several questions which I have answered to her satisfaction Electronic Signature(s) Signed: 05/05/2017 2:19:05 PM By: Christin Fudge MD, FACS Previous Signature: 05/05/2017 2:17:40 PM Version By: Christin Fudge MD, FACS Entered By: Christin Fudge on 05/05/2017 14:19:05 Lebow, Laniqua F. ([id number]) -------------------------------------------------------------------------------- ROS/PFSH Details Patient Name: Pricer, Caroljean F. Date of Service: 05/05/2017 1:30 PM Medical Record Number: [id number] Patient Account Number: [id number] Date of Birth/Sex:  07-14-1924 (83 y.o. Female) Treating RN: Carolyne Fiscal,  Debi Primary Care Provider: Halina Maidens Other Clinician: Referring Provider: Halina Maidens Treating Provider/Extender: Frann Rider in Treatment: 62 Information Obtained From Patient Wound History Do you currently have one or more open woundso Yes How many open wounds do you currently haveo 1 Approximately how long have you had your woundso 2 weeks How have you been treating your wound(s) until nowo clobetasol Has your wound(s) ever healed and then re-openedo Yes Have you had any lab work done in the past montho No Have you tested positive for an antibiotic resistant organism (MRSA, VRE)o No Have you tested positive for osteomyelitis (bone infection)o Yes Have you had any tests for circulation on your legso Yes Where was the test doneo [facility] Have you had other problems associated with your woundso Swelling Endocrine Medical History: Positive for: Type II Diabetes Time with diabetes: 2003 Treated with: Oral agents Immunizations Pneumococcal Vaccine: Received Pneumococcal Vaccination: Yes Implantable Devices Family and Social History Cancer: Yes - Siblings,Child,Maternal Grandparents; Diabetes: Yes - Mother,Siblings; Heart Disease: Yes - Father; Hereditary Spherocytosis: No; Hypertension: Yes - Siblings; Kidney Disease: No; Lung Disease: No; Seizures: No; Stroke: Yes - Father,Mother; Thyroid Problems: Yes - Child; Tuberculosis: No; Former smoker - smoked late teens early 79s; Marital Status - Widowed; Alcohol Use: Never; Drug Use: No History; Caffeine Use: Daily; Financial Concerns: No; Food, Clothing or Shelter Needs: No; Support System Lacking: No; Transportation Concerns: No; Advanced Directives: No; Patient does not want information on Advanced Directives; Do not resuscitate: No; Living Will: Yes (Not Provided); Medical Power of Attorney: Yes (Not Provided) Physician Affirmation I have reviewed and agree with the above information. Electronic Signature(s) Signed:  05/05/2017 4:11:32 PM By: Alric Quan Signed: 05/05/2017 4:27:13 PM By: Christin Fudge MD, FACS Entered By: Christin Fudge on 05/05/2017 14:15:58 [place], Dolly F. ([id number]) -------------------------------------------------------------------------------- SuperBill Details Patient Name: Belgard, Heiress F. Date of Service: 05/05/2017 Medical Record Number: [id number] Patient Account Number: [id number] Date of Birth/Sex: [birth year] (83 y.o. Female) Treating RN: Carolyne Fiscal, Debi Primary Care Provider: Halina Maidens Other Clinician: Referring Provider: Halina Maidens Treating Provider/Extender: Frann Rider in Treatment: 18 Diagnosis Coding ICD-10 Codes Code Description E11.622 Type 2 diabetes mellitus with other skin ulcer I10 Essential (primary) hypertension I89.0 Lymphedema, not elsewhere classified Facility Procedures CPT4 Code: 86578469 Description: (Facility Use Only) [phone] - [place] LWR LT LEG Modifier: Quantity: 1 Physician Procedures CPT4 Code: 1324401 Description: 02725 - WC PHYS LEVEL 3 - EST PT ICD-10 Diagnosis Description E11.622 Type 2 diabetes mellitus with other skin ulcer I10 Essential (primary) hypertension I89.0 Lymphedema, not elsewhere classified Modifier: Quantity: 1 Electronic Signature(s) Signed: 05/05/2017 3:26:56 PM By: Alric Quan Signed: 05/05/2017 4:27:13 PM By: Christin Fudge MD, FACS Previous Signature: 05/05/2017 2:19:17 PM Version By: Christin Fudge MD, FACS Entered By: Alric Quan on 05/05/2017 15:26:56

## 2017-05-09 ENCOUNTER — Ambulatory Visit: Payer: Medicare PPO | Admitting: Gastroenterology

## 2017-05-09 ENCOUNTER — Other Ambulatory Visit: Payer: Self-pay

## 2017-05-09 ENCOUNTER — Encounter: Payer: Self-pay | Admitting: Gastroenterology

## 2017-05-09 VITALS — BP 183/121 | HR 84 | Temp 98.0°F | Ht 62.0 in | Wt 162.8 lb

## 2017-05-09 DIAGNOSIS — R1310 Dysphagia, unspecified: Secondary | ICD-10-CM

## 2017-05-09 NOTE — Patient Instructions (Signed)
You are scheduled for a barium swallow at [facility]: Laura [name] on Tuesday, Dec 4th at 12:00pm. You will need to check into the medical mall registration desk at 11:45am. You cannot have anything to eat or drink 3 hours prior to test.  If you need to reschedule this appointment for any reason, please contact central scheduling at [phone].  If you have any questions or concerns, please contact our office at [phone].

## 2017-05-09 NOTE — Progress Notes (Signed)
[name], [name] ([id number]) Visit Report for 05/05/2017 Arrival Information Details [name], [name] Date of Service: 05/05/2017 1:30 PM Medical Record Number: [id number] Patient Account Number: [id number] Date of Birth/Sex: 30-Oct-1924 (83 y.o. Female) Treating RN: Carolyne Fiscal, Debi Primary Care Prisila Dlouhy: Halina Maidens Other Clinician: Referring Bailey Faiella: Halina Maidens Treating Analia Zuk/Extender: Frann Rider in Treatment: 54 Visit Information History Since Last Visit All ordered tests and consults were completed: No Patient Arrived: Kasandra Knudsen Added or deleted any medications: No Arrival Time: 13:45 Any new allergies or adverse reactions: No Accompanied By: friend Had a fall or experienced change in No Transfer Assistance: Easy[employer] Patient activities of daily living that may affect Lift risk of falls: Patient Identification Verified: Yes Signs or symptoms of abuse/neglect since last visito No Secondary Verification Process Yes Hospitalized since last visit: No Completed: Has Dressing in Place as Prescribed: Yes Patient Requires Transmission-Based No Precautions: Has Compression in Place as Prescribed: No Patient Has Alerts: Yes Pain Present Now: No Patient Alerts: DM II Notes pt had took her compression wrap off to shower before she came for her visit Electronic Signature(s) Signed: 05/05/2017 4:11:32 PM By: Alric Quan Entered By: Alric Quan on 05/05/2017 13:46:25 [name], [name] ([id number]) -------------------------------------------------------------------------------- Encounter Discharge Information Details [name], [name] Date of Service: 05/05/2017 1:30 PM Medical Record Number: [id number] Patient Account Number: [id number] Date of Birth/Sex: 10-27-1924 (83 y.o. Female) Treating RN: Carolyne Fiscal, Debi Primary Care Rigel Filsinger: Halina Maidens Other Clinician: Referring Shailah Gibbins: Halina Maidens Treating Coleen Cardiff/Extender: Frann Rider in Treatment: 59 Encounter Discharge Information Items Discharge Pain Level: 0 Discharge Condition: Stable Ambulatory Status: Cane Discharge Destination: Home Transportation: Private Auto Accompanied By: friend Schedule Follow-up Appointment: Yes Medication Reconciliation completed and No provided to Patient/Care Ivanna Kocak: Provided on Clinical Summary of Care: 05/05/2017 Form Type Recipient Paper Patient [facility] Electronic Signature(s) Signed: 05/09/2017 8:35:27 AM By: Ruthine Dose Previous Signature: 05/05/2017 1:59:32 PM Version By: Alric Quan Entered By: Ruthine Dose on 05/05/2017 14:27:53 [name], [name] ([id number]) -------------------------------------------------------------------------------- Lower Extremity Assessment Details [name], [name] Date of Service: 05/05/2017 1:30 PM Medical Record Number: [id number] Patient Account Number: [id number] Date of Birth/Sex: 1925/03/25 (83 y.o. Female) Treating RN: Carolyne Fiscal, Debi Primary Care Jossiah Smoak: Halina Maidens Other Clinician: Referring Alric Geise: Halina Maidens Treating Zoe Creasman/Extender: Frann Rider in Treatment: 18 Edema Assessment Assessed: Shirlyn Goltz: No] Patrice Paradise: No] [Left: Edema] [Right: :] Calf Left: Right: Point of Measurement: 27 cm From Medial Instep 31.5 cm cm Ankle Left: Right: Point of Measurement: 10 cm From Medial Instep 22 cm cm Vascular Assessment Pulses: Dorsalis Pedis Palpable: [Left:Yes] Posterior Tibial Extremity colors, hair growth, and conditions: Extremity Color: [Left:Red] Temperature of Extremity: [Left:Warm] Capillary Refill: [Left:< 3 seconds] Toe Nail Assessment Left: Right: Thick: Yes Discolored: Yes Deformed: No Improper Length and Hygiene: No Electronic Signature(s) Signed: 05/05/2017 4:11:32 PM By: Alric Quan Entered By: Alric Quan on 05/05/2017 13:56:33 [name], [name]  ([id number]) -------------------------------------------------------------------------------- Multi Wound Chart Details [name], [name] Date of Service: 05/05/2017 1:30 PM Medical Record Number: [id number] Patient Account Number: [id number] Date of Birth/Sex: 01/17/1925 (83 y.o. Female) Treating RN: Carolyne Fiscal, Debi Primary Care Deunte Bledsoe: Halina Maidens Other Clinician: Referring Shira Bobst: Halina Maidens Treating Julie-Anne Torain/Extender: Frann Rider in Treatment: 18 Vital Signs Height(in): 63 Pulse(bpm): 60 Weight(lbs): 164.8 Blood Pressure(mmHg): 157/91 Body Mass Index(BMI): 29 Temperature(F): 97.8 Respiratory Rate 16 (breaths/min): Photos: [1:No Photos] [N/A:N/A] Wound Location: [1:Left Lower Leg - Lateral] [N/A:N/A] Wounding Event: [1:Gradually Appeared] [N/A:N/A] Primary Etiology: [1:Diabetic Wound/Ulcer of the Lower Extremity] [N/A:N/A]  Comorbid History: [1:Type II Diabetes] [N/A:N/A] Date Acquired: [1:12/16/2016] [N/A:N/A] Weeks of Treatment: [1:18] [N/A:N/A] Wound Status: [1:Open] [N/A:N/A] Measurements L x W x D [1:3x4x0.1] [N/A:N/A] (cm) Area (cm) : [1:9.425] [N/A:N/A] Volume (cm) : [1:0.942] [N/A:N/A] % Reduction in Area: [1:-859.80%] [N/A:N/A] % Reduction in Volume: [1:-861.20%] [N/A:N/A] Classification: [1:Grade 1] [N/A:N/A] Exudate Amount: [1:Large] [N/A:N/A] Exudate Type: [1:Serosanguineous] [N/A:N/A] Exudate Color: [1:red, brown] [N/A:N/A] Wound Margin: [1:Flat and Intact] [N/A:N/A] Granulation Amount: [1:Large (67-100%)] [N/A:N/A] Necrotic Amount: [1:Small (1-33%)] [N/A:N/A] Exposed Structures: [1:Fat Layer (Subcutaneous Tissue) Exposed: Yes Fascia: No Tendon: No Muscle: No Joint: No Bone: No] [N/A:N/A] Epithelialization: [1:Small (1-33%)] [N/A:N/A] Periwound Skin Texture: [1:Excoriation: Yes Induration: No Callus: No Crepitus: No Rash: No Scarring: No] [N/A:N/A] Periwound Skin Moisture: [N/A:N/A] Maceration: Yes Dry/Scaly:  No Periwound Skin Color: Erythema: Yes N/A N/A Atrophie Blanche: No Cyanosis: No Ecchymosis: No Hemosiderin Staining: No Mottled: No Pallor: No Rubor: No Erythema Location: Circumferential N/A N/A Temperature: No Abnormality N/A N/A Tenderness on Palpation: Yes N/A N/A Wound Preparation: Ulcer Cleansing: N/A N/A Rinsed/Irrigated with Saline, Other: soap and water Topical Anesthetic Applied: Other: lidocaine 4% Treatment Notes Electronic Signature(s) Signed: 05/05/2017 2:14:57 PM By: Christin Fudge MD, FACS Entered By: Christin Fudge on 05/05/2017 14:14:57 [name], [name] ([id number]) -------------------------------------------------------------------------------- [place] Details Patient Name: Azzie [name], [name] Date of Service: 05/05/2017 1:30 PM Medical Record Number: [id number] Patient Account Number: [id number] Date of Birth/Sex: 1925-03-13 (83 y.o. Female) Treating RN: Carolyne Fiscal, Debi Primary Care Fawna Cranmer: Halina Maidens Other Clinician: Referring Quaniya Damas: Halina Maidens Treating Kennedy Bohanon/Extender: Frann Rider in Treatment: 36 Active Inactive ` Abuse / Safety / Falls / Self Care Management Nursing Diagnoses: Potential for falls Goals: Patient will not experience any injury related to falls Date Initiated: 12/30/2016 Target Resolution Date: 04/29/2017 Goal Status: Active Interventions: Assess Activities of Daily Living upon admission and as needed Assess: immobility, friction, shearing, incontinence upon admission and as needed Notes: ` Nutrition Nursing Diagnoses: Imbalanced nutrition Impaired glucose control: actual or potential Potential for alteratiion in Nutrition/Potential for imbalanced nutrition Goals: Patient/caregiver will maintain therapeutic glucose control Date Initiated: 12/30/2016 Target Resolution Date: 04/29/2017 Goal Status: Active Interventions: Assess patient nutrition upon admission and as needed per  policy Notes: ` Orientation to the Wound Care Program Nursing Diagnoses: Knowledge deficit related to the wound healing center program Goals: Patient/caregiver will verbalize understanding of the [place] Date Initiated: 12/30/2016 Target Resolution Date: 01/21/2017 [name], Laura Wanda Plump ([id number]) Goal Status: Active Interventions: Provide education on orientation to the wound center Notes: ` Pain, Acute or Chronic Nursing Diagnoses: Pain, acute or chronic: actual or potential Potential alteration in comfort, pain Goals: Patient/caregiver will verbalize adequate pain control between visits Date Initiated: 12/30/2016 Target Resolution Date: 04/29/2017 Goal Status: Active Interventions: Complete pain assessment as per visit requirements Notes: ` Wound/Skin Impairment Nursing Diagnoses: Impaired tissue integrity Knowledge deficit related to smoking impact on wound healing Knowledge deficit related to ulceration/compromised skin integrity Goals: Ulcer/skin breakdown will have a volume reduction of 80% by week 12 Date Initiated: 12/30/2016 Target Resolution Date: 04/22/2017 Goal Status: Active Interventions: Assess patient/caregiver ability to perform ulcer/skin care regimen upon admission and as needed Notes: Electronic Signature(s) Signed: 05/05/2017 4:11:32 PM By: Alric Quan Entered By: Alric Quan on 05/05/2017 13:56:43 Ficco, Jarielys F. ([id number]) -------------------------------------------------------------------------------- Pain Assessment Details [name], [name] Date of Service: 05/05/2017 1:30 PM Medical Record Number: [id number] Patient Account Number: [id number] Date of Birth/Sex: 07-21-1924 (83 y.o. Female) Treating RN: Carolyne Fiscal, Debi Primary Care Airam Runions: Halina Maidens Other Clinician: Referring  Khylen Riolo: Halina Maidens Treating Jamyria Ozanich/Extender: Frann Rider in Treatment: 18 Active Problems Location of Pain  Severity and Description of Pain Patient Has Paino No Site Locations Pain Management and Medication Current Pain Management: Electronic Signature(s) Signed: 05/05/2017 4:11:32 PM By: Alric Quan Entered By: Alric Quan on 05/05/2017 13:46:31 [place], Laura [name] F. ([id number]) -------------------------------------------------------------------------------- Patient/Caregiver Education Details Patient Name: Azzie [name], Bula F. Date of Service: 05/05/2017 1:30 PM Medical Record Number: [id number] Patient Account Number: [id number] Date of Birth/Gender: 03/03/1925 (83 y.o. Female) Treating RN: Ahmed Prima Primary Care Physician: Halina Maidens Other Clinician: Referring Physician: Halina Maidens Treating Physician/Extender: Frann Rider in Treatment: 6 Education Assessment Education Provided To: Patient Education Topics Provided Wound/Skin Impairment: Handouts: Other: change dressing as ordered Methods: Demonstration, Explain/Verbal Responses: State content correctly Electronic Signature(s) Signed: 05/05/2017 4:11:32 PM By: Alric Quan Entered By: Alric Quan on 05/05/2017 13:59:48 [place], Josy F. ([id number]) -------------------------------------------------------------------------------- Wound Assessment Details Patient Name: Marcelino, Jacolyn F. Date of Service: 05/05/2017 1:30 PM Medical Record Number: [id number] Patient Account Number: [id number] Date of Birth/Sex: 11-04-1924 (83 y.o. Female) Treating RN: Carolyne Fiscal, Debi Primary Care Fahmida Jurich: Halina Maidens Other Clinician: Referring Allecia Bells: Halina Maidens Treating Keeya Dyckman/Extender: Frann Rider in Treatment: 18 Wound Status Wound Number: 1 Primary Etiology: Diabetic Wound/Ulcer of the Lower Extremity Wound Location: Left Lower Leg - Lateral Wound Status: Open Wounding Event: Gradually Appeared Comorbid Type II Diabetes Date Acquired: 12/16/2016 History: Weeks Of Treatment: 18 Clustered Wound:  No Photos Photo Uploaded By: Alric Quan on 05/05/2017 16:03:41 Wound Measurements Length: (cm) 3 Width: (cm) 4 Depth: (cm) 0.1 Area: (cm) 9.425 Volume: (cm) 0.942 % Reduction in Area: -859.8% % Reduction in Volume: -861.2% Epithelialization: Small (1-33%) Tunneling: No Undermining: No Wound Description Classification: Grade 1 Wound Margin: Flat and Intact Exudate Amount: Large Exudate Type: Serosanguineous Exudate Color: red, brown Foul Odor After Cleansing: No Slough/Fibrino Yes Wound Bed Granulation Amount: Large (67-100%) Exposed Structure Necrotic Amount: Small (1-33%) Fascia Exposed: No Necrotic Quality: Adherent Slough Fat Layer (Subcutaneous Tissue) Exposed: Yes Tendon Exposed: No Muscle Exposed: No Joint Exposed: No Bone Exposed: No Periwound Skin Texture Tozer, Antwan F. ([id number]) Texture Color No Abnormalities Noted: No No Abnormalities Noted: No Callus: No Atrophie Blanche: No Crepitus: No Cyanosis: No Excoriation: Yes Ecchymosis: No Induration: No Erythema: Yes Rash: No Erythema Location: Circumferential Scarring: No Hemosiderin Staining: No Mottled: No Moisture Pallor: No No Abnormalities Noted: No Rubor: No Dry / Scaly: No Maceration: Yes Temperature / Pain Temperature: No Abnormality Tenderness on Palpation: Yes Wound Preparation Ulcer Cleansing: Rinsed/Irrigated with Saline, Other: soap and water, Topical Anesthetic Applied: Other: lidocaine 4%, Treatment Notes Wound #1 (Left, Lateral Lower Leg) 1. Cleansed with: Clean wound with Normal Saline 2. Anesthetic Topical Lidocaine 4% cream to wound bed prior to debridement 3. Peri-wound Care: Antifungal cream 4. Dressing Applied: Other dressing (specify in notes) 5. Secondary Dressing Applied ABD Pad Foam 7. Secured with Tape 3 Layer Compression System - Left Lower Extremity Notes unna to anchor, cutimed sorbact, drawtex Electronic Signature(s) Signed: 05/05/2017  4:11:32 PM By: Alric Quan Entered By: Alric Quan on 05/05/2017 13:54:40 [place]meier, Camil F. ([id number]) -------------------------------------------------------------------------------- Vitals Details Patient Name: Robb, Zeniah F. Date of Service: 05/05/2017 1:30 PM Medical Record Number: [id number] Patient Account Number: [id number] Date of Birth/Sex: 06/30/1924 (83 y.o. Female) Treating RN: Ahmed Prima Primary Care Makyna Niehoff: Halina Maidens Other Clinician: Referring Takako Minckler: Halina Maidens Treating Gavyn Zoss/Extender: Frann Rider in Treatment: 18 Vital Signs Time Taken: 13:46 Temperature (F): 97.8 Height (in): 63 Pulse (bpm): 73 Weight (lbs): 164.8  Respiratory Rate (breaths/min): 16 Body Mass Index (BMI): 29.2 Blood Pressure (mmHg): 157/91 Reference Range: 80 - 120 mg / dl Electronic Signature(s) Signed: 05/05/2017 4:11:32 PM By: Alric Quan Entered By: Alric Quan on 05/05/2017 13:53:20

## 2017-05-09 NOTE — Progress Notes (Signed)
Gastroenterology Consultation  Referring Provider:     Beverly Gust, MD Primary Care Physician:  Glean Hess, MD Primary Gastroenterologist:  Dr. Allen Norris     Reason for Consultation:     Dysphagia        HPI:   Laura [name] is a 83 y.o. y/o female referred for consultation & management of Dysphagia by Dr. Army Melia, Jesse Sans, MD.  This patient comes in today with a history of dysphagia.  The patient reports that she has had this for many years and recalls seeing somebody processing 25 years ago for this.  The patient states it seems to be getting more frequent.  She reports that for no apparent reason she will eat and feel a severe pain in the middle of her esophagus and the food will go down.  She denies any nausea vomiting black stools or bloody stools.  She states that the symptoms go away and she was seen by ENT who recommended her to see me.  The patient reports that she thinks she was told many years ago that she has esophageal spasms but is not on any treatment for this.  Past Medical History:  Diagnosis Date  . Arthritis   . Cystitis   . Diabetes ([place])   . Heart disease   . HTN (hypertension)     Past Surgical History:  Procedure Laterality Date  . APPENDECTOMY    . bone marrow withdrawal    . colon polyp removal    . Chestnut  . TOE AMPUTATION Left 08/2013  . TOTAL ABDOMINAL HYSTERECTOMY  1963    Prior to Admission medications   Medication Sig Start Date End Date Taking? Authorizing Provider  ACCU-CHEK AVIVA PLUS test strip USE ONE STRIP TO CHECK GLUCOSE ONCE DAILY 01/29/17  Yes Glean Hess, MD  acetaminophen (TYLENOL) 500 MG tablet Take 500 mg by mouth every 6 (six) hours as needed.   Yes [provider]  amLODipine (NORVASC) 2.5 MG tablet Take 1 tablet by mouth daily.   Yes [provider]  aspirin 81 MG chewable tablet Chew 1 tablet by mouth daily.   Yes [provider]  cholecalciferol (VITAMIN D) 1000 units  tablet Take 1,000 Units by mouth daily.   Yes [provider]  clobetasol ointment (TEMOVATE) 0.05 % CLOBETASOL PROPIONATE, 0.05% (External Ointment) - Historical Medication  appication two times daily (0.05 %) Active   Yes [provider]  cloNIDine (CATAPRES) 0.2 MG tablet TAKE ONE TABLET BY MOUTH TWICE DAILY 08/19/16  Yes Glean Hess, MD  fexofenadine (ALLEGRA) 180 MG tablet Take 1 tablet by mouth daily as needed. 12/11/12  Yes [provider]  ibuprofen (ADVIL,MOTRIN) 200 MG tablet Take 400 mg by mouth 2 (two) times daily.    Yes [provider]  losartan (COZAAR) 100 MG tablet TAKE ONE TABLET BY MOUTH ONCE DAILY 11/01/16  Yes Glean Hess, MD  metFORMIN (GLUCOPHAGE) 500 MG tablet Take 1 tablet (500 mg total) by mouth daily. 09/13/16  Yes Glean Hess, MD  mometasone (NASONEX) 50 MCG/ACT nasal spray Place 2 sprays into the nose daily as needed. 09/13/16  Yes Glean Hess, MD  triamterene-hydrochlorothiazide (MAXZIDE-25) 37.5-25 MG tablet Take 1 tablet by mouth daily. 01/29/17  Yes [provider]    Family History  Problem Relation Age of Onset  . Diabetes Mother   . Stroke Mother   . Hypertension Father   . Kidney disease  Neg Hx   . Bladder Cancer Neg Hx      Social History   Tobacco Use  . Smoking status: Never Smoker  . Smokeless tobacco: Never Used  Substance Use Topics  . Alcohol use: No    Alcohol/week: 0.0 oz  . Drug use: No    Allergies as of 05/09/2017 - Review Complete 05/09/2017  Allergen Reaction Noted  . Augmentin [amoxicillin-pot clavulanate] Other (See Comments) 09/21/2015  . Calcium channel blockers Shortness Of Breath 01/16/2015  . Nitrofurantoin Shortness Of Breath 08/06/2015  . Ace inhibitors Cough 01/16/2015  . Atorvastatin Other (See Comments) 03/29/2016  . Beta adrenergic blockers  01/16/2015  . Levaquin [levofloxacin]  03/31/2016  . Pravastatin Other (See Comments) 03/29/2016  . Statins   01/16/2015  . Sulfa antibiotics  09/16/2015  . Clindamycin Rash 01/16/2015  . Clindamycin/lincomycin Rash 01/16/2015    Review of Systems:    All systems reviewed and negative except where noted in HPI.   Physical Exam:  BP (!) 183/121 (BP Location: Left Arm, Patient Position: Sitting, Cuff Size: Normal)   Pulse 84   Temp 98 F (36.7 C) (Oral)   Ht 5\' 2"  (1.575 m)   Wt 162 lb 12.8 oz (73.8 kg)   BMI 29.78 kg/m  No LMP recorded. Patient has had a hysterectomy. Psych:  Alert and cooperative. Normal mood and affect. General:   Alert,  Well-developed, well-nourished, pleasant and cooperative in NAD Head:  Normocephalic and atraumatic. Eyes:  Sclera clear, no icterus.   Conjunctiva pink. Ears:  Normal auditory acuity. Nose:  No deformity, discharge, or lesions. Mouth:  No deformity or lesions,oropharynx pink & moist. Neck:  Supple; no masses or thyromegaly. Lungs:  Respirations even and unlabored.  Clear throughout to auscultation.   No wheezes, crackles, or rhonchi. No acute distress. Heart:  Regular rate and rhythm; no murmurs, clicks, rubs, or gallops. Abdomen:  Normal bowel sounds.  No bruits.  Soft, non-tender and non-distended without masses, hepatosplenomegaly or hernias noted.  No guarding or rebound tenderness.  Negative Carnett sign.   Rectal:  Deferred.  Msk:  Symmetrical without gross deformities.  Good, equal movement & strength bilaterally. Pulses:  Normal pulses noted. Extremities:  No clubbing .  There was some edema and the legs bilaterally.  The patient is in a brace on her left leg.  No cyanosis. Neurologic:  Alert and oriented x3;  grossly normal neurologically. Skin:  Intact without significant lesions or rashes.  No jaundice. Lymph Nodes:  No significant cervical adenopathy. Psych:  Alert and cooperative. Normal mood and affect.  Imaging Studies: No results found.  Assessment and Plan:   Laura [name] is a 83 y.o. y/o female who comes in with dysphagia.  The  patient may have an esophageal stricture versus nutcracker's esophagus versus distal esophageal spasms.  The patient will be set up for a barium swallow to look of the esophagus and see if there are any strictures or narrowings or if there are any signs of motility disorders.  The patient has been explained the plan and agrees with it  Lucilla Lame, MD. Marval Regal   Note: This dictation was prepared with Dragon dictation along with smaller phrase technology. Any transcriptional errors that result from this process are unintentional.

## 2017-05-19 ENCOUNTER — Encounter: Payer: Medicare PPO | Admitting: Surgery

## 2017-05-19 DIAGNOSIS — E11622 Type 2 diabetes mellitus with other skin ulcer: Secondary | ICD-10-CM | POA: Diagnosis not present

## 2017-05-21 NOTE — Progress Notes (Signed)
[name] Visit Report for 05/19/2017 Arrival Information Details Patient Name: [name] Date of Service: 05/19/2017 1:30 PM Medical Record Number: [id number] Patient Account Number: [id number] Date of Birth/Sex: Feb 07, 1925 (83 y.o. Female) Treating RN: Carolyne Fiscal, Debi Primary Care Jerriyah Louis: Halina Maidens Other Clinician: Referring Alyscia Carmon: Halina Maidens Treating Jasin Brazel/Extender: Frann Rider in Treatment: 15 Visit Information History Since Last Visit All ordered tests and consults were completed: No Patient Arrived: Laura [name] Added or deleted any medications: No Arrival Time: 13:40 Any new allergies or adverse reactions: No Accompanied By: friend Had a fall or experienced change in No Transfer Assistance: EasyPivot Patient activities of daily living that may affect Lift risk of falls: Patient Requires Transmission-Based No Signs or symptoms of abuse/neglect since last visito No Precautions: Hospitalized since last visit: No Patient Has Alerts: Yes Has Dressing in Place as Prescribed: Yes Patient Alerts: DM II Has Compression in Place as Prescribed: Yes Pain Present Now: No Electronic Signature(s) Signed: 05/19/2017 4:39:39 PM By: Alric Quan Entered By: Alric Quan on 05/19/2017 13:41:00 [name] -------------------------------------------------------------------------------- Encounter Discharge Information Details Patient Name: [name] Date of Service: 05/19/2017 1:30 PM Medical Record Number: [id number] Patient Account Number: [id number] Date of Birth/Sex: 1925/05/19 (83 y.o. Female) Treating RN: Carolyne Fiscal, Debi Primary Care Leeanna Slaby: Halina Maidens Other Clinician: Referring Jaydrian Corpening: Halina Maidens Treating Casson Catena/Extender: Frann Rider in Treatment: 20 Encounter Discharge Information Items Discharge Pain Level: 0 Discharge Condition: Stable Ambulatory Status: Cane Discharge Destination:  Home Transportation: Private Auto Accompanied By: friend Schedule Follow-up Appointment: Yes Medication Reconciliation completed and No provided to Patient/Care Mahasin Riviere: Provided on Clinical Summary of Care: 05/19/2017 Form Type Recipient Paper Patient [facility] Electronic Signature(s) Signed: 05/19/2017 2:35:46 PM By: Sharon Mt Previous Signature: 05/19/2017 1:52:30 PM Version By: Alric Quan Entered By: Sharon Mt on 05/19/2017 14:35:46 [name] -------------------------------------------------------------------------------- Lower Extremity Assessment Details Patient Name: [name] Date of Service: 05/19/2017 1:30 PM Medical Record Number: [id number] Patient Account Number: [id number] Date of Birth/Sex: 1925-02-21 (83 y.o. Female) Treating RN: Carolyne Fiscal, Debi Primary Care Callee Rohrig: Halina Maidens Other Clinician: Referring Cannie Muckle: Halina Maidens Treating Cottrell Gentles/Extender: Frann Rider in Treatment: 20 Edema Assessment Assessed: Laura [name]: No] Patrice Paradise: No] [Left: Edema] [Right: :] Calf Left: Right: Point of Measurement: 34 cm From Medial Instep 33.7 cm cm Ankle Left: Right: Point of Measurement: 10 cm From Medial Instep 22 cm cm Vascular Assessment Pulses: Dorsalis Pedis Palpable: [Left:Yes] Posterior Tibial Extremity colors, hair growth, and conditions: Extremity Color: [Left:Red] Temperature of Extremity: [Left:Warm] Capillary Refill: [Left:< 3 seconds] Toe Nail Assessment Left: Right: Thick: No Discolored: No Deformed: No Improper Length and Hygiene: No Electronic Signature(s) Signed: 05/19/2017 4:39:39 PM By: Alric Quan Entered By: Alric Quan on 05/19/2017 13:48:14 [name] -------------------------------------------------------------------------------- Multi Wound Chart Details Patient Name: [name] Date of Service: 05/19/2017 1:30 PM Medical Record Number: [id number] Patient Account Number:  [id number] Date of Birth/Sex: July 09, 1924 (83 y.o. Female) Treating RN: Carolyne Fiscal, Debi Primary Care Issa Kosmicki: Halina Maidens Other Clinician: Referring Joshawn Crissman: Halina Maidens Treating Raul Torrance/Extender: Frann Rider in Treatment: 20 Vital Signs Height(in): 63 Pulse(bpm): 70 Weight(lbs): 164.8 Blood Pressure(mmHg): 151/89 Body Mass Index(BMI): 29 Temperature([name]): 98.0 Respiratory Rate 16 (breaths/min): Photos: [1:No Photos] [N/A:N/A] Wound Location: [1:Left Lower Leg - Lateral] [N/A:N/A] Wounding Event: [1:Gradually Appeared] [N/A:N/A] Primary Etiology: [1:Diabetic Wound/Ulcer of the Lower Extremity] [N/A:N/A] Comorbid History: [1:Type II Diabetes] [N/A:N/A] Date Acquired: [1:12/16/2016] [N/A:N/A] Weeks of Treatment: [1:20] [N/A:N/A] Wound Status: [1:Open] [N/A:N/A] Measurements L x W x D [  1:2.2x3.5x0.1] [N/A:N/A] (cm) Area (cm) : [1:6.048] [N/A:N/A] Volume (cm) : [1:0.605] [N/A:N/A] % Reduction in Area: [1:-515.90%] [N/A:N/A] % Reduction in Volume: [1:-517.30%] [N/A:N/A] Classification: [1:Grade 1] [N/A:N/A] Exudate Amount: [1:Large] [N/A:N/A] Exudate Type: [1:Serosanguineous] [N/A:N/A] Exudate Color: [1:red, brown] [N/A:N/A] Wound Margin: [1:Flat and Intact] [N/A:N/A] Granulation Amount: [1:Medium (34-66%)] [N/A:N/A] Granulation Quality: [1:Red] [N/A:N/A] Necrotic Amount: [1:Medium (34-66%)] [N/A:N/A] Exposed Structures: [1:Fat Layer (Subcutaneous Tissue) Exposed: Yes Fascia: No Tendon: No Muscle: No Joint: No Bone: No] [N/A:N/A] Epithelialization: [1:Small (1-33%)] [N/A:N/A] Periwound Skin Texture: [1:Excoriation: Yes Induration: No Callus: No Crepitus: No Rash: No Scarring: No] [N/A:N/A] Periwound Skin Moisture: Maceration: Yes N/A N/A Dry/Scaly: No Periwound Skin Color: Erythema: Yes N/A N/A Atrophie Blanche: No Cyanosis: No Ecchymosis: No Hemosiderin Staining: No Mottled: No Pallor: No Rubor: No Erythema Location: Circumferential N/A  N/A Temperature: No Abnormality N/A N/A Tenderness on Palpation: Yes N/A N/A Wound Preparation: Ulcer Cleansing: N/A N/A Rinsed/Irrigated with Saline, Other: soap and water Topical Anesthetic Applied: Other: lidocaine 4% Treatment Notes Wound #1 (Left, Lateral Lower Leg) 1. Cleansed with: Clean wound with Normal Saline Cleanse wound with antibacterial soap and water 2. Anesthetic Topical Lidocaine 4% cream to wound bed prior to debridement 3. Peri-wound Care: Antifungal cream 4. Dressing Applied: Other dressing (specify in notes) 5. Secondary Dressing Applied ABD Pad 7. Secured with Tape 3 Layer Compression System - Left Lower Extremity Notes unna to anchor, cutimed sorbact, drawtex Electronic Signature(s) Signed: 05/19/2017 2:44:38 PM By: Christin Fudge MD, FACS Entered By: Christin Fudge on 05/19/2017 14:44:38 Caroll, Ahsley [name]. ([id number]) -------------------------------------------------------------------------------- [place] Details Patient Name: Ohlin, Lakeyta [name]. Date of Service: 05/19/2017 1:30 PM Medical Record Number: [id number] Patient Account Number: [id number] Date of Birth/Sex: 07-Aug-1924 (83 y.o. Female) Treating RN: Carolyne Fiscal, Debi Primary Care Gerilyn Stargell: Halina Maidens Other Clinician: Referring Arran Fessel: Halina Maidens Treating Avalina Benko/Extender: Frann Rider in Treatment: 20 Active Inactive ` Abuse / Safety / Falls / Self Care Management Nursing Diagnoses: Potential for falls Goals: Patient will not experience any injury related to falls Date Initiated: 12/30/2016 Target Resolution Date: 04/29/2017 Goal Status: Active Interventions: Assess Activities of Daily Living upon admission and as needed Assess: immobility, friction, shearing, incontinence upon admission and as needed Notes: ` Nutrition Nursing Diagnoses: Imbalanced nutrition Impaired glucose control: actual or potential Potential for alteratiion in Nutrition/Potential  for imbalanced nutrition Goals: Patient/caregiver will maintain therapeutic glucose control Date Initiated: 12/30/2016 Target Resolution Date: 04/29/2017 Goal Status: Active Interventions: Assess patient nutrition upon admission and as needed per policy Notes: ` Orientation to the Wound Care Program Nursing Diagnoses: Knowledge deficit related to the wound healing center program Goals: Patient/caregiver will verbalize understanding of the [facility] Date Initiated: 12/30/2016 Target Resolution Date: 01/21/2017 Novak, Taimane Wanda Plump ([id number]) Goal Status: Active Interventions: Provide education on orientation to the wound center Notes: ` Pain, Acute or Chronic Nursing Diagnoses: Pain, acute or chronic: actual or potential Potential alteration in comfort, pain Goals: Patient/caregiver will verbalize adequate pain control between visits Date Initiated: 12/30/2016 Target Resolution Date: 04/29/2017 Goal Status: Active Interventions: Complete pain assessment as per visit requirements Notes: ` Wound/Skin Impairment Nursing Diagnoses: Impaired tissue integrity Knowledge deficit related to smoking impact on wound healing Knowledge deficit related to ulceration/compromised skin integrity Goals: Ulcer/skin breakdown will have a volume reduction of 80% by week 12 Date Initiated: 12/30/2016 Target Resolution Date: 04/22/2017 Goal Status: Active Interventions: Assess patient/caregiver ability to perform ulcer/skin care regimen upon admission and as needed Notes: Electronic Signature(s) Signed: 05/19/2017 4:39:39 PM By: Alric Quan Entered By:  Alric Quan on 05/19/2017 13:49:26 [name] ([id number]) -------------------------------------------------------------------------------- Pain Assessment Details Patient Name: [name] Date of Service: 05/19/2017 1:30 PM Medical Record Number: [id number] Patient Account Number: [id number] Date of Birth/Sex:  08/31/1924 (83 y.o. Female) Treating RN: Carolyne Fiscal, Debi Primary Care Diamonte Stavely: Halina Maidens Other Clinician: Referring Sherill Mangen: Halina Maidens Treating Dhani Dannemiller/Extender: Frann Rider in Treatment: 20 Active Problems Location of Pain Severity and Description of Pain Patient Has Paino No Site Locations Pain Management and Medication Current Pain Management: Electronic Signature(s) Signed: 05/19/2017 4:39:39 PM By: Alric Quan Entered By: Alric Quan on 05/19/2017 13:41:05 [name] ([id number]) -------------------------------------------------------------------------------- Patient/Caregiver Education Details Patient Name: Azzie [name] Date of Service: 05/19/2017 1:30 PM Medical Record Number: [id number] Patient Account Number: [id number] Date of Birth/Gender: 10-12-1924 (83 y.o. Female) Treating RN: Ahmed Prima Primary Care Physician: Halina Maidens Other Clinician: Referring Physician: Halina Maidens Treating Physician/Extender: Frann Rider in Treatment: 20 Education Assessment Education Provided To: Patient Education Topics Provided Wound/Skin Impairment: Handouts: Caring for Your Ulcer, Other: change dressing as ordered Methods: Demonstration, Explain/Verbal Responses: State content correctly Electronic Signature(s) Signed: 05/19/2017 4:39:39 PM By: Alric Quan Entered By: Alric Quan on 05/19/2017 13:52:49 [name] ([id number]) -------------------------------------------------------------------------------- Wound Assessment Details Patient Name: Harder, Diannia [name]. Date of Service: 05/19/2017 1:30 PM Medical Record Number: [id number] Patient Account Number: [id number] Date of Birth/Sex: [birth year] (83 y.o. Female) Treating RN: Carolyne Fiscal, Debi Primary Care Leeba Barbe: Halina Maidens Other Clinician: Referring Jameyah Fennewald: Halina Maidens Treating Lin Glazier/Extender: Frann Rider in Treatment: 20 Wound Status Wound  Number: 1 Primary Etiology: Diabetic Wound/Ulcer of the Lower Extremity Wound Location: Left Lower Leg - Lateral Wound Status: Open Wounding Event: Gradually Appeared Comorbid Type II Diabetes Date Acquired: 12/16/2016 History: Weeks Of Treatment: 20 Clustered Wound: No Photos Photo Uploaded By: Alric Quan on 05/19/2017 16:37:14 Wound Measurements Length: (cm) 2.2 Width: (cm) 3.5 Depth: (cm) 0.1 Area: (cm) 6.048 Volume: (cm) 0.605 % Reduction in Area: -515.9% % Reduction in Volume: -517.3% Epithelialization: Small (1-33%) Tunneling: No Undermining: No Wound Description Classification: Grade 1 Wound Margin: Flat and Intact Exudate Amount: Large Exudate Type: Serosanguineous Exudate Color: red, brown Foul Odor After Cleansing: No Slough/Fibrino Yes Wound Bed Granulation Amount: Medium (34-66%) Exposed Structure Granulation Quality: Red Fascia Exposed: No Necrotic Amount: Medium (34-66%) Fat Layer (Subcutaneous Tissue) Exposed: Yes Necrotic Quality: Adherent Slough Tendon Exposed: No Muscle Exposed: No Joint Exposed: No Bone Exposed: No Periwound Skin Texture Klecker, [name] ([id number]) Texture Color No Abnormalities Noted: No No Abnormalities Noted: No Callus: No Atrophie Blanche: No Crepitus: No Cyanosis: No Excoriation: Yes Ecchymosis: No Induration: No Erythema: Yes Rash: No Erythema Location: Circumferential Scarring: No Hemosiderin Staining: No Mottled: No Moisture Pallor: No No Abnormalities Noted: No Rubor: No Dry / Scaly: No Maceration: Yes Temperature / Pain Temperature: No Abnormality Tenderness on Palpation: Yes Wound Preparation Ulcer Cleansing: Rinsed/Irrigated with Saline, Other: soap and water, Topical Anesthetic Applied: Other: lidocaine 4%, Treatment Notes Wound #1 (Left, Lateral Lower Leg) 1. Cleansed with: Clean wound with Normal Saline Cleanse wound with antibacterial soap and water 2. Anesthetic Topical Lidocaine  4% cream to wound bed prior to debridement 3. Peri-wound Care: Antifungal cream 4. Dressing Applied: Other dressing (specify in notes) 5. Secondary Dressing Applied ABD Pad 7. Secured with Tape 3 Layer Compression System - Left Lower Extremity Notes unna to anchor, cutimed sorbact, drawtex Electronic Signature(s) Signed: 05/19/2017 4:39:39 PM By: Alric Quan Entered By: Alric Quan on 05/19/2017 13:45:26 Mariscal, Cheris [name]. ([id number]) -------------------------------------------------------------------------------- Vitals Details Patient Name:  Innes, Refugia [name]. Date of Service: 05/19/2017 1:30 PM Medical Record Number: [id number] Patient Account Number: [id number] Date of Birth/Sex: May 15, 1925 (83 y.o. Female) Treating RN: Carolyne Fiscal, Debi Primary Care Cahlil Sattar: Halina Maidens Other Clinician: Referring Dedra Matsuo: Halina Maidens Treating Jewell Haught/Extender: Frann Rider in Treatment: 20 Vital Signs Time Taken: 13:42 Temperature ([name]): 98.0 Height (in): 63 Pulse (bpm): 70 Weight (lbs): 164.8 Respiratory Rate (breaths/min): 16 Body Mass Index (BMI): 29.2 Blood Pressure (mmHg): 151/89 Reference Range: 80 - 120 mg / dl Electronic Signature(s) Signed: 05/19/2017 4:39:39 PM By: Alric Quan Entered By: Alric Quan on 05/19/2017 13:44:20

## 2017-05-21 NOTE — Progress Notes (Addendum)
[name], [name] ([id number]) Visit Report for 05/19/2017 Chief Complaint Document Details Patient Name: DUSING, [name] Date of Service: 05/19/2017 1:30 PM Medical Record Number: [id number] Patient Account Number: [id number] Date of Birth/Sex: 01-06-1925 (83 y.o. Female) Treating RN: Ahmed Prima Primary Care Provider: Halina Maidens Other Clinician: Referring Provider: Halina Maidens Treating Provider/Extender: Frann Rider in Treatment: 20 Information Obtained from: Patient Chief Complaint Left lower leg cellulitis Electronic Signature(s) Signed: 05/19/2017 2:44:44 PM By: Christin Fudge MD, FACS Entered By: Christin Fudge on 05/19/2017 14:44:44 Pujol, Aundraya F. ([id number]) -------------------------------------------------------------------------------- HPI Details Patient Name: Morford, Tennie F. Date of Service: 05/19/2017 1:30 PM Medical Record Number: [id number] Patient Account Number: [id number] Date of Birth/Sex: 1924/11/16 (83 y.o. Female) Treating RN: Carolyne Fiscal, Debi Primary Care Provider: Halina Maidens Other Clinician: Referring Provider: Halina Maidens Treating Provider/Extender: Frann Rider in Treatment: 20 History of Present Illness HPI Description: 12/30/16 Patient presents today for initial evaluation concerning an area over her left lateral ankle which she tells me has been intermittent in nature since 2000. However the current opening has been present for about two weeks. She has been tolerating the dressing changes at home with antibiotic ointment but that is really the only treatment that has been initiated at this point. That is other than the oral antibiotics which was Keflex that patient was placed on by her primary care provider prior to being referred to [place]. She does have some discomfort but rates this to be around a 2-3 out of 10 fortunately the redness does not seem to be spreading to any other location as far as her lower extremity is concerned. She does  tell me that in the past antibiotics have been beneficial for her unfortunately the current antibiotics have not been of benefit and no wound culture was performed as of yet. She has previously seen Dr. 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 She does have diabetes and are most recent blood sugars have run between 104 and 106 according to patient's although her last hemoglobin A1c she is unaware of. Patient's current wound does not appear to be significantly open but is rather more of a weeping region of cellulitis. 01/27/17 on evaluation today patient left lower for many wound continues to show signs of erythema surrounding and in fact she is noted to have erythema from her toes up to just below the knee in regard to left lower extremity. She is not having any fevers, chills, nausea, vomiting, or diarrhea at this point. With that being said she is concerned about the silver alginate dressing. She tells me that in the past she used silver and some of the dressings for previous one and did not do well with it. Nonetheless she has discomfort along the wound itself but no other pain noted throughout the left lower extremity. 02/03/17 on evaluation today patient appears to be doing fairly well overall other than the fact that her wound is worsening on the lateral aspect of her left lower extremity. It appears more macerated and even slough covered at this point. With that being said she has been attempting to perform the dressing changes on her own although I'm not sure that she is understanding exactly how this should be done. Nonetheless I do believe the gentamicin may be causing too much maceration she really has not wanted to utilize  the silver alginate dressings past due to a previous issue with this. Overall her erythema  of the left lower extremity is improved she still has significant swelling. No fevers, chills, nausea, or vomiting noted at this time. 02/10/2017 -- Old notes received -- was seen in 2009 by [facility] by Dr. Romie Levee -- his impression was chronic patches of dermatitis on the left lower extremity and current mild swelling. He recommended venous insufficiency venous reflux studies. She had no evidence of arterial insufficiency. Follow-up chronic venous insufficiency study done on 12/16/2007, showed very mild amount of reflux on the great saphenous vein at the knee but the function of the vein is normal and there was no evidence by the venous reflux study.. They referred her to dermatology for a second opinion and would see her back in about 4-6 weeks. She was seen back in follow-up in January 01 2008 and at that time she was asked to use a steroid ointment locally and the wounds are almost completely healed. The patient has told me today that she does not tolerate silver dressing -- and she has not had home health come and change her dressings over the last week. 02/17/2017 -- - had a lower arterial study done which showed a bilateral ABI suggests no significant lower extremity arterial disease and the toe brachial indicis were normal on the right and abnormal on the left. The right ABI was 1.0 and the left was 1.07. The right toe brachial indices was 0.81 on the right and 0.56 on the left. She had biphasic and triphasic flow at the tibial vessels. 02/24/2017 -- the patient has been tolerating a 3 layer wrap and seems to be pleased with her progress 03/17/17 on evaluation today patient appears to be doing better in regard to her left lateral lower extremity wound. She has been Tolerating the wraps without complication. No fevers, chills, nausea, or vomiting noted at this time. She is pleased with [name], [name] ([id number]) how things are progressing. Early complaint is that she is unable to take  a shower for such a long time from Wednesday all the way till the following Monday. 03/31/2017 - the patient and her nurse were concerned about the redness of the wound and the discharge from the wound but other than that everything else has been okay and she has been doing fairly well. She has no constitutional symptoms 05/05/2017 -- she has made excellent progress and overall is very pleased. I believe if she continues at this rate we will be able to put her in her own compression stockings soon Electronic Signature(s) Signed: 05/19/2017 2:44:48 PM By: Christin Fudge MD, FACS Entered By: Christin Fudge on 05/19/2017 14:44:48 Sanjuan, Gillian F. ([id number]) -------------------------------------------------------------------------------- Physical Exam Details Patient Name: Skare, Lakita F. Date of Service: 05/19/2017 1:30 PM Medical Record Number: [id number] Patient Account Number: [id number] Date of Birth/Sex: [birth year] (83 y.o. Female) Treating RN: Ahmed Prima Primary Care Provider: Halina Maidens Other Clinician: Referring Provider: Halina Maidens Treating Provider/Extender: Frann Rider in Treatment: 20 Constitutional . Pulse regular. Respirations normal and unlabored. Afebrile. . Eyes Nonicteric. Reactive to light. Ears, Nose, Mouth, and Throat Lips, teeth, and gums WNL.Marland Kitchen Moist mucosa without lesions. Neck supple and nontender. No palpable supraclavicular or cervical adenopathy. Normal sized without goiter. Respiratory WNL. No retractions.. Cardiovascular Pedal Pulses WNL. No clubbing, cyanosis or edema. Lymphatic No adneopathy. No adenopathy. No adenopathy. Musculoskeletal Adexa without tenderness or enlargement.. Digits and nails w/o clubbing, cyanosis, infection, petechiae, ischemia, or inflammatory  conditions.. Integumentary (Hair, Skin) No suspicious lesions. No crepitus or fluctuance. No peri-wound warmth or erythema. No masses.Marland Kitchen Psychiatric Judgement and insight  Intact.. No evidence of depression, anxiety, or agitation.. Notes for lymphedema is well controlled and the wound is looking very much cleaner with healthy granulation tissue and no debridement was required. The fungal infection around her main wound is also looking overall better. Electronic Signature(s) Signed: 05/19/2017 2:45:31 PM By: Christin Fudge MD, FACS Entered By: Christin Fudge on 05/19/2017 14:45:30 Haws, Yanelis F. ([id number]) -------------------------------------------------------------------------------- Physician Orders Details Patient Name: Yarborough, Caterin F. Date of Service: 05/19/2017 1:30 PM Medical Record Number: [id number] Patient Account Number: [id number] Date of Birth/Sex: 04-23-1925 (83 y.o. Female) Treating RN: Carolyne Fiscal, Debi Primary Care Provider: Halina Maidens Other Clinician: Referring Provider: Halina Maidens Treating Provider/Extender: Frann Rider in Treatment: 20 Verbal / Phone Orders: Yes Clinician: Carolyne Fiscal, Debi Read Back and Verified: Yes Diagnosis Coding Wound Cleansing Wound #1 Left,Lateral Lower Leg o Clean wound with wound cleanser. o Cleanse wound with mild soap and water o Other: - pt may take wrap off and shower before nurse comes or before she comes to clinic Anesthetic Wound #1 Left,Lateral Lower Leg o Topical Lidocaine 4% cream applied to wound bed prior to debridement Skin Barriers/Peri-Wound Care Wound #1 Left,Lateral Lower Leg o Antifungal cream - to red areas around wound (do not place on the ulcer itself) ****not a window****completely cover the reddened area around the wound (peri-wound area) Primary Wound Dressing Wound #1 Left,Lateral Lower Leg o Cutimed Sorbact - (with foam over sorbact) cut enough to just cover wound Secondary Dressing Wound #1 Left,Lateral Lower Leg o ABD pad o Foam - cut enough to just cover wound o Drawtex Dressing Change Frequency o Change Dressing Monday, Wednesday, Friday -  HHRN to go change dressing Monday, Wednesday and Friday every other week ***Starting next week*** on the opposite week the [facility] to go change the dressing on Mondays and Wednesdays and the pt will be seen at the [place] on Fridays Follow-up Appointments Wound #1 Left,Lateral Lower Leg o Return Appointment in 2 weeks. Edema Control Wound #1 Left,Lateral Lower Leg o 3 Layer Compression System - Left Lower Extremity - Please wrap 3cm from toes and 3cm from knee. unna to anchor o Elevate legs to the level of the heart and pump ankles as often as possible Additional Orders / Instructions Eickhoff, Pariss F. ([id number]) Wound #1 Left,Lateral Lower Leg o Increase protein intake. Home Health Wound #1 [place] [facility] - [facility] to go change dressing Monday, Wednesday and Friday every other week ***Starting next week*** on the opposite week the [facility] to go change the dressing on Mondays and Wednesdays and the pt will be seen at the [place] on Fridays o Home Health Nurse may visit PRN to address patientos wound care needs. o FACE TO FACE ENCOUNTER: MEDICARE and MEDICAID PATIENTS: I certify that this patient is under my care and that I had a face-to-face encounter that meets the physician face-to-face encounter requirements with this patient on this date. The encounter with the patient was in whole or in part for the following MEDICAL CONDITION: (primary reason for [place]) MEDICAL NECESSITY: I certify, that based on my findings, NURSING services are a medically necessary home health service. HOME BOUND STATUS: I certify that my clinical findings support that this patient is homebound (i.e., Due to illness or injury, pt requires aid of supportive devices such as crutches, cane, wheelchairs,  walkers, the use of special transportation or the assistance of another person to leave their place of residence. There is a normal inability to  leave the home and doing so requires considerable and taxing effort. Other absences are for medical reasons / religious services and are infrequent or of short duration when for other reasons). o If current dressing causes regression in wound condition, may D/C ordered dressing product/s and apply Normal Saline Moist Dressing daily until next [place] / Other MD appointment. [place] of regression in wound condition at [phone]. o Please direct any NON-WOUND related issues/requests for orders to patient's Primary Care Physician Medications-please add to medication list. Wound #1 Left,Lateral Lower Leg o Other: - Vitamin A, Vitamin C, Zinc, MVI Electronic Signature(s) Signed: 05/19/2017 4:42:32 PM By: Christin Fudge MD, FACS Signed: 05/22/2017 4:03:37 PM By: Alric Quan Previous Signature: 05/19/2017 4:39:39 PM Version By: Alric Quan Previous Signature: 05/19/2017 4:42:19 PM Version By: Christin Fudge MD, FACS Entered By: Alric Quan on 05/19/2017 16:42:22 Furr, Lucy F. ([id number]) -------------------------------------------------------------------------------- Problem List Details Patient Name: Salado, Mikah F. Date of Service: 05/19/2017 1:30 PM Medical Record Number: [id number] Patient Account Number: [id number] Date of Birth/Sex: Aug 23, 1924 (83 y.o. Female) Treating RN: Carolyne Fiscal, Debi Primary Care Provider: Halina Maidens Other Clinician: Referring Provider: Halina Maidens Treating Provider/Extender: Frann Rider in Treatment: 20 Active Problems ICD-10 Encounter Code Description Active Date Diagnosis E11.622 Type 2 diabetes mellitus with other skin ulcer 01/02/2017 Yes I10 Essential (primary) hypertension 01/02/2017 Yes I89.0 Lymphedema, not elsewhere classified 01/16/2017 Yes Inactive Problems Resolved Problems ICD-10 Code Description Active Date Resolved Date L03.116 Cellulitis of left lower limb 01/02/2017  01/02/2017 Electronic Signature(s) Signed: 05/19/2017 2:44:33 PM By: Christin Fudge MD, FACS Entered By: Christin Fudge on 05/19/2017 14:44:33 Suminski, Dody F. ([id number]) -------------------------------------------------------------------------------- Progress Note Details Patient Name: Bok, Maelin F. Date of Service: 05/19/2017 1:30 PM Medical Record Number: [id number] Patient Account Number: [id number] Date of Birth/Sex: 12-06-1924 (83 y.o. Female) Treating RN: Carolyne Fiscal, Debi Primary Care Provider: Halina Maidens Other Clinician: Referring Provider: Halina Maidens Treating Provider/Extender: Frann Rider in Treatment: 20 Subjective Chief Complaint Information obtained from Patient Left lower leg cellulitis History of Present Illness (HPI) 12/30/16 Patient presents today for initial evaluation concerning an area over her left lateral ankle which she tells me has been intermittent in nature since 2000. However the current opening has been present for about two weeks. She has been tolerating the dressing changes at home with antibiotic ointment but that is really the only treatment that has been initiated at this point. That is other than the oral antibiotics which was Keflex that patient was placed on by her primary care provider prior to being referred to [place]. She does have some discomfort but rates this to be around a 2-3 out of 10 fortunately the redness does not seem to be spreading to any other location as far as her lower extremity is concerned. She does tell me that in the past antibiotics have been beneficial for her unfortunately the current antibiotics have not been of benefit and no wound culture was performed as of yet. She has previously seen Dr. 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 She does have  diabetes and are most recent blood sugars have run between 104 and 106 according to patient's although her last hemoglobin  A1c she is unaware of. Patient's current wound does not appear to be significantly open but is rather more of a weeping region of cellulitis. 01/27/17 on evaluation today patient left lower for many wound continues to show signs of erythema surrounding and in fact she is noted to have erythema from her toes up to just below the knee in regard to left lower extremity. She is not having any fevers, chills, nausea, vomiting, or diarrhea at this point. With that being said she is concerned about the silver alginate dressing. She tells me that in the past she used silver and some of the dressings for previous one and did not do well with it. Nonetheless she has discomfort along the wound itself but no other pain noted throughout the left lower extremity. 02/03/17 on evaluation today patient appears to be doing fairly well overall other than the fact that her wound is worsening on the lateral aspect of her left lower extremity. It appears more macerated and even slough covered at this point. With that being said she has been attempting to perform the dressing changes on her own although I'm not sure that she is understanding exactly how this should be done. Nonetheless I do believe the gentamicin may be causing too much maceration she really has not wanted to utilize the silver alginate dressings past due to a previous issue with this. Overall her erythema of the left lower extremity is improved she still has significant swelling. No fevers, chills, nausea, or vomiting noted at this time. 02/10/2017 -- Old notes received -- was seen in 2009 by [facility] by Dr. Romie Levee -- his impression was chronic patches of dermatitis on the left lower extremity and current mild swelling. He recommended venous insufficiency venous reflux studies. She had no evidence of arterial  insufficiency. Follow-up chronic venous insufficiency study done on 12/16/2007, showed very mild amount of reflux on the great saphenous vein at the knee but the function of the vein is normal and there was no evidence by the venous reflux study.. They referred her to dermatology for a second opinion and would see her back in about 4-6 weeks. She was seen back in follow-up in January 01 2008 and at that time she was asked to use a steroid ointment locally and the wounds are almost completely healed. The patient has told me today that she does not tolerate silver dressing -- and she has not had home health come and change her dressings over the last week. 02/17/2017 -- - had a lower arterial study done which showed a bilateral ABI suggests no significant lower extremity arterial disease and the toe brachial indicis were normal on the right and abnormal on the left. The right ABI was 1.0 and the left was 1.07. The right toe brachial indices was 0.81 on the right and 0.56 on the left. She had biphasic and triphasic flow at the tibial Kille, Aliayah F. ([id number]) vessels. 02/24/2017 -- the patient has been tolerating a 3 layer wrap and seems to be pleased with her progress 03/17/17 on evaluation today patient appears to be doing better in regard to her left lateral lower extremity wound. She has been Tolerating the wraps without complication. No fevers, chills, nausea, or vomiting noted at this time. She is pleased with how things are progressing. Early complaint is that she is unable to take a shower for such a long time from Wednesday all the way till the following Monday. 03/31/2017 - the patient and her  nurse were concerned about the redness of the wound and the discharge from the wound but other than that everything else has been okay and she has been doing fairly well. She has no constitutional symptoms 05/05/2017 -- she has made excellent progress and overall is very pleased. I believe if she continues  at this rate we will be able to put her in her own compression stockings soon Patient History Information obtained from Patient. Family History Cancer - Siblings,Child,Maternal Grandparents, Diabetes - Mother,Siblings, Heart Disease - Father, Hypertension - Siblings, Stroke - Father,Mother, Thyroid Problems - Child, No family history of Hereditary Spherocytosis, Kidney Disease, Lung Disease, Seizures, Tuberculosis. Social History Former smoker - smoked late teens early 37s, Marital Status - Widowed, Alcohol Use - Never, Drug Use - No History, Caffeine Use - Daily. Objective Constitutional Pulse regular. Respirations normal and unlabored. Afebrile. Vitals Time Taken: 1:42 PM, Height: 63 in, Weight: 164.8 lbs, BMI: 29.2, Temperature: 98.0 F, Pulse: 70 bpm, Respiratory Rate: 16 breaths/min, Blood Pressure: 151/89 mmHg. Eyes Nonicteric. Reactive to light. Ears, Nose, Mouth, and Throat Lips, teeth, and gums WNL.Marland Kitchen Moist mucosa without lesions. Neck supple and nontender. No palpable supraclavicular or cervical adenopathy. Normal sized without goiter. Respiratory WNL. No retractions.. Cardiovascular Baskin, Brandelyn F. ([id number]) Pedal Pulses WNL. No clubbing, cyanosis or edema. Lymphatic No adneopathy. No adenopathy. No adenopathy. Musculoskeletal Adexa without tenderness or enlargement.. Digits and nails w/o clubbing, cyanosis, infection, petechiae, ischemia, or inflammatory conditions.Marland Kitchen Psychiatric Judgement and insight Intact.. No evidence of depression, anxiety, or agitation.. General Notes: for lymphedema is well controlled and the wound is looking very much cleaner with healthy granulation tissue and no debridement was required. The fungal infection around her main wound is also looking overall better. Integumentary (Hair, Skin) No suspicious lesions. No crepitus or fluctuance. No peri-wound warmth or erythema. No masses.. Wound #1 status is Open. Original cause of wound was  Gradually Appeared. The wound is located on the Left,Lateral Lower Leg. The wound measures 2.2cm length x 3.5cm width x 0.1cm depth; 6.048cm^2 area and 0.605cm^3 volume. There is Fat Layer (Subcutaneous Tissue) Exposed exposed. There is no tunneling or undermining noted. There is a large amount of serosanguineous drainage noted. The wound margin is flat and intact. There is medium (34-66%) red granulation within the wound bed. There is a medium (34-66%) amount of necrotic tissue within the wound bed including Adherent Slough. The periwound skin appearance exhibited: Excoriation, Maceration, Erythema. The periwound skin appearance did not exhibit: Callus, Crepitus, Induration, Rash, Scarring, Dry/Scaly, Atrophie Blanche, Cyanosis, Ecchymosis, Hemosiderin Staining, Mottled, Pallor, Rubor. The surrounding wound skin color is noted with erythema which is circumferential. Periwound temperature was noted as No Abnormality. The periwound has tenderness on palpation. Assessment Active Problems ICD-10 E11.622 - Type 2 diabetes mellitus with other skin ulcer I10 - Essential (primary) hypertension I89.0 - Lymphedema, not elsewhere classified Plan Wound Cleansing: Wound #1 Left,Lateral Lower Leg: Clean wound with wound cleanser. Cleanse wound with mild soap and water Other: - pt may take wrap off and shower before nurse comes or before she comes to clinic Anesthetic: Wound #1 Left,Lateral Lower Leg: Topical Lidocaine 4% cream applied to wound bed prior to debridement Skin Barriers/Peri-Wound Care: Wound #1 Left,Lateral Lower Leg: Rodwell, Anitta F. ([id number]) Antifungal cream - to red areas around wound (do not place on the ulcer itself) ****not a window****completely cover the reddened area around the wound (peri-wound area) Primary Wound Dressing: Wound #1 Left,Lateral Lower Leg: Cutimed Sorbact - (with foam over sorbact) cut enough  to just cover wound Secondary Dressing: Wound #1 Left,Lateral  Lower Leg: ABD pad Foam - cut enough to just cover wound Drawtex Dressing Change Frequency: Change Dressing Monday, Wednesday, Friday - HHRN to go change dressing Monday, Wednesday and Friday every other week ***Starting next week*** on the opposite week the [facility] to go change the dressing on Mondays and Wednesdays and the pt will be seen at the [facility] on Fridays Follow-up Appointments: Wound #1 Left,Lateral Lower Leg: Return Appointment in 2 weeks. Edema Control: Wound #1 Left,Lateral Lower Leg: 3 Layer Compression System - Left Lower Extremity - Please wrap 3cm from toes and 3cm from knee. unna to anchor Elevate legs to the level of the heart and pump ankles as often as possible Additional Orders / Instructions: Wound #1 Left,Lateral Lower Leg: Increase protein intake. Home Health: Wound #1 Left,Lateral Lower Leg: Continue Home Health Visits - HHRN to go change dressing Monday, Wednesday and Friday every other week ***Starting next week*** on the opposite week the [facility] to go change the dressing on Mondays and Wednesdays and the pt will be seen at the [facility] on Fridays Home Health Nurse may visit PRN to address patient s wound care needs. FACE TO FACE ENCOUNTER: MEDICARE and MEDICAID PATIENTS: I certify that this patient is under my care and that I had a face-to-face encounter that meets the physician face-to-face encounter requirements with this patient on this date. The encounter with the patient was in whole or in part for the following MEDICAL CONDITION: (primary reason for [place]) MEDICAL NECESSITY: I certify, that based on my findings, NURSING services are a medically necessary home health service. HOME BOUND STATUS: I certify that my clinical findings support that this patient is homebound (i.e., Due to illness or injury, pt requires aid of supportive devices such as crutches, cane, wheelchairs, walkers, the use of special transportation or the  assistance of another person to leave their place of residence. There is a normal inability to leave the home and doing so requires considerable and taxing effort. Other absences are for medical reasons / religious services and are infrequent or of short duration when for other reasons). If current dressing causes regression in wound condition, may D/C ordered dressing product/s and apply Normal Saline Moist Dressing daily until next [place] / Other MD appointment. [place] of regression in wound condition at [phone]. Please direct any NON-WOUND related issues/requests for orders to patient's Primary Care Physician Medications-please add to medication list.: Wound #1 Left,Lateral Lower Leg: Other: - Vitamin A, Vitamin C, Zinc, MVI the patient is doing well with the present regimen of wound care and overall she is very comfortable and pleased with her progress. I have recommended: 1. Sorbact with foam and a 3 layer Profore wrap to be changed 3 times a week. 2. we will use a rim of econazole around her main wound 3. Elevation and exercise has been discussed with her Jamerson, Lovinia F. ([id number]) she had several questions which I have answered to her satisfaction. she will come back and see as every couple of weeks Electronic Signature(s) Signed: 05/19/2017 5:01:44 PM By: Christin Fudge MD, FACS Previous Signature: 05/19/2017 2:46:33 PM Version By: Christin Fudge MD, FACS Entered By: Christin Fudge on 05/19/2017 17:01:44 Wise, Shaleta F. ([id number]) -------------------------------------------------------------------------------- ROS/PFSH Details Patient Name: Glennon, Kristyl F. Date of Service: 05/19/2017 1:30 PM Medical Record Number: [id number] Patient Account Number: [id number] Date of Birth/Sex: 1924-11-12 (83 y.o. Female) Treating RN: Carolyne Fiscal,  Debi Primary Care Provider: Halina Maidens Other Clinician: Referring Provider: Halina Maidens Treating  Provider/Extender: Frann Rider in Treatment: 20 Information Obtained From Patient Wound History Do you currently have one or more open woundso Yes How many open wounds do you currently haveo 1 Approximately how long have you had your woundso 2 weeks How have you been treating your wound(s) until nowo clobetasol Has your wound(s) ever healed and then re-openedo Yes Have you had any lab work done in the past montho No Have you tested positive for an antibiotic resistant organism (MRSA, VRE)o No Have you tested positive for osteomyelitis (bone infection)o Yes Have you had any tests for circulation on your legso Yes [facility] the test [facility] Have you had other problems associated with your woundso Swelling Endocrine Medical History: Positive for: Type II Diabetes Time with diabetes: 2003 Treated with: Oral agents Immunizations Pneumococcal Vaccine: Received Pneumococcal Vaccination: Yes Implantable Devices Family and Social History Cancer: Yes - Siblings,Child,Maternal Grandparents; Diabetes: Yes - Mother,Siblings; Heart Disease: Yes - Father; Hereditary Spherocytosis: No; Hypertension: Yes - Siblings; Kidney Disease: No; Lung Disease: No; Seizures: No; Stroke: Yes - Father,Mother; Thyroid Problems: Yes - Child; Tuberculosis: No; Former smoker - smoked late teens early 69s; Marital Status - Widowed; Alcohol Use: Never; Drug Use: No History; Caffeine Use: Daily; Financial Concerns: No; Food, Clothing or Shelter Needs: No; Support System Lacking: No; Transportation Concerns: No; Advanced Directives: No; Patient does not want information on Advanced Directives; Do not resuscitate: No; Living Will: Yes (Not Provided); Medical Power of Attorney: Yes (Not Provided) Physician Affirmation I have reviewed and agree with the above information. Electronic Signature(s) Signed: 05/19/2017 4:39:39 PM By: Alric Quan Signed: 05/19/2017 4:42:19 PM By: Christin Fudge MD, FACS Entered  By: Christin Fudge on 05/19/2017 14:44:55 Erlandson, Shweta F. ([id number]) -------------------------------------------------------------------------------- SuperBill Details Patient Name: Marcinek, Brittony F. Date of Service: 05/19/2017 Medical Record Number: [id number] Patient Account Number: [id number] Date of Birth/Sex: July 30, 1924 (83 y.o. Female) Treating RN: Carolyne Fiscal, Debi Primary Care Provider: Halina Maidens Other Clinician: Referring Provider: Halina Maidens Treating Provider/Extender: Frann Rider in Treatment: 20 Diagnosis Coding ICD-10 Codes Code Description E11.622 Type 2 diabetes mellitus with other skin ulcer I10 Essential (primary) hypertension I89.0 Lymphedema, not elsewhere classified Facility Procedures CPT4 Code: [id number] Description: (Facility Use Only) [phone] - [place] LWR LT LEG Modifier: Quantity: 1 Physician Procedures CPT4 Code: 4163845 Description: 36468 - WC PHYS LEVEL 3 - EST PT ICD-10 Diagnosis Description E11.622 Type 2 diabetes mellitus with other skin ulcer I10 Essential (primary) hypertension I89.0 Lymphedema, not elsewhere classified Modifier: Quantity: 1 Electronic Signature(s) Signed: 05/19/2017 3:27:04 PM By: Alric Quan Signed: 05/19/2017 4:42:19 PM By: Christin Fudge MD, FACS Previous Signature: 05/19/2017 2:46:48 PM Version By: Christin Fudge MD, FACS Entered By: Alric Quan on 05/19/2017 15:27:04

## 2017-05-23 ENCOUNTER — Ambulatory Visit
Admission: RE | Admit: 2017-05-23 | Discharge: 2017-05-23 | Disposition: A | Discharge status: Home / Self Care | Payer: Medicare PPO | Source: Ambulatory Visit | Attending: Gastroenterology | Admitting: Gastroenterology

## 2017-05-23 DIAGNOSIS — R1310 Dysphagia, unspecified: Secondary | ICD-10-CM | POA: Diagnosis not present

## 2017-05-23 DIAGNOSIS — K228 Other specified diseases of esophagus: Secondary | ICD-10-CM | POA: Diagnosis not present

## 2017-05-26 ENCOUNTER — Encounter: Payer: Medicare PPO | Admitting: Surgery

## 2017-05-31 ENCOUNTER — Telehealth: Payer: Self-pay

## 2017-05-31 NOTE — Telephone Encounter (Signed)
Pt notified of barium swallow results. Pt stated she is not having any issues currently. If she develops any issues, she will call back to schedule EGD.

## 2017-05-31 NOTE — Telephone Encounter (Signed)
-----   Message from Lucilla Lame, MD sent at 05/26/2017  3:09 PM EST ----- This patient know that there is a narrowing at the distal esophagus and she should be set up for a EGD if the symptoms are still present.  There was also some decreased motility of the esophagus which could also contribute to her having trouble swallowing.

## 2017-06-02 ENCOUNTER — Encounter: Payer: Medicare PPO | Attending: Surgery | Admitting: Surgery

## 2017-06-02 DIAGNOSIS — L97222 Non-pressure chronic ulcer of left calf with fat layer exposed: Secondary | ICD-10-CM | POA: Diagnosis not present

## 2017-06-02 DIAGNOSIS — E11622 Type 2 diabetes mellitus with other skin ulcer: Secondary | ICD-10-CM | POA: Diagnosis present

## 2017-06-02 DIAGNOSIS — I890 Lymphedema, not elsewhere classified: Secondary | ICD-10-CM | POA: Diagnosis not present

## 2017-06-02 DIAGNOSIS — Z7984 Long term (current) use of oral hypoglycemic drugs: Secondary | ICD-10-CM | POA: Insufficient documentation

## 2017-06-02 DIAGNOSIS — Z87891 Personal history of nicotine dependence: Secondary | ICD-10-CM | POA: Insufficient documentation

## 2017-06-02 DIAGNOSIS — I10 Essential (primary) hypertension: Secondary | ICD-10-CM | POA: Insufficient documentation

## 2017-06-02 DIAGNOSIS — Z880 Allergy status to penicillin: Secondary | ICD-10-CM | POA: Insufficient documentation

## 2017-06-02 DIAGNOSIS — Z881 Allergy status to other antibiotic agents status: Secondary | ICD-10-CM | POA: Insufficient documentation

## 2017-06-03 NOTE — Progress Notes (Addendum)
[name] ([id number]) Visit Report for 06/02/2017 Chief Complaint Document Details Patient Name: [name] Date of Service: 06/02/2017 1:30 PM Medical Record Number: [id number] Patient Account Number: [id number] Date of Birth/Sex: 1924-11-13 (83 y.o. Female) Treating RN: Laura [name] Primary Care Provider: Halina [name] Other Clinician: Referring Provider: Halina [name] Treating Provider/Extender: Laura [name] in Treatment: 22 Information Obtained from: Patient Chief Complaint Left lower leg cellulitis Electronic Signature(s) Signed: 06/02/2017 2:38:15 PM By: Laura Fudge MD, FACS Entered By: Laura [name] on 06/02/2017 14:38:15 [name] ([id number]) -------------------------------------------------------------------------------- HPI Details Patient Name: [name] Date of Service: 06/02/2017 1:30 PM Medical Record Number: [id number] Patient Account Number: [id number] Date of Birth/Sex: [birth year] (83 y.o. Female) Treating RN: Carolyne Fiscal, Debi Primary Care Provider: Halina [name] Other Clinician: Referring Provider: Halina [name] Treating Provider/Extender: Laura [name] in Treatment: 22 History of Present Illness HPI Description: 12/30/16 Patient presents today for initial evaluation concerning an area over her left lateral ankle which she tells me has been intermittent in nature since 2000. However the current opening has been present for about two weeks. She has been tolerating the dressing changes at home with antibiotic ointment but that is really the only treatment that has been initiated at this point. That is other than the oral antibiotics which was Keflex that patient was placed on by her primary care provider prior to being referred to [place]. She does have some discomfort but rates this to be around a 2-3 out of 10 fortunately the redness does not seem to be spreading to any other location as far as her lower extremity is concerned. She does  tell me that in the past antibiotics have been beneficial for her unfortunately the current antibiotics have not been of benefit and no wound culture was performed as of yet. She has previously seen Dr. 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 She does have diabetes and are most recent blood sugars have run between 104 and 106 according to patient's although her last hemoglobin A1c she is unaware of. Patient's current wound does not appear to be significantly open but is rather more of a weeping region of cellulitis. 01/27/17 on evaluation today patient left lower for many wound continues to show signs of erythema surrounding and in fact she is noted to have erythema from her toes up to just below the knee in regard to left lower extremity. She is not having any fevers, chills, nausea, vomiting, or diarrhea at this point. With that being said she is concerned about the silver alginate dressing. She tells me that in the past she used silver and some of the dressings for previous one and did not do well with it. Nonetheless she has discomfort along the wound itself but no other pain noted throughout the left lower extremity. 02/03/17 on evaluation today patient appears to be doing fairly well overall other than the fact that her wound is worsening on the lateral aspect of her left lower extremity. It appears more macerated and even slough covered at this point. With that being said she has been attempting to perform the dressing changes on her own although I'm not sure that she is understanding exactly how this should be done. Nonetheless I do believe the gentamicin may be causing too much maceration she really has not wanted to utilize  the silver alginate dressings past due to a previous issue with this. Overall her erythema  of the left lower extremity is improved she still has significant swelling. No fevers, chills, nausea, or vomiting noted at this time. 02/10/2017 -- Old notes received -- was seen in 2009 by [facility] by Dr. Romie [name] -- his impression was chronic patches of dermatitis on the left lower extremity and current mild swelling. He recommended venous insufficiency venous reflux studies. She had no evidence of arterial insufficiency. Follow-up chronic venous insufficiency study done on 12/16/2007, showed very mild amount of reflux on the great saphenous vein at the knee but the function of the vein is normal and there was no evidence by the venous reflux study.. They referred her to dermatology for a second opinion and would see her back in about 4-6 weeks. She was seen back in follow-up in January 01 2008 and at that time she was asked to use a steroid ointment locally and the wounds are almost completely healed. The patient has told me today that she does not tolerate silver dressing -- and she has not had home health come and change her dressings over the last week. 02/17/2017 -- - had a lower arterial study done which showed a bilateral ABI suggests no significant lower extremity arterial disease and the toe brachial indicis were normal on the right and abnormal on the left. The right ABI was 1.0 and the left was 1.07. The right toe brachial indices was 0.81 on the right and 0.56 on the left. She had biphasic and triphasic flow at the tibial vessels. 02/24/2017 -- the patient has been tolerating a 3 layer wrap and seems to be pleased with her progress 03/17/17 on evaluation today patient appears to be doing better in regard to her left lateral lower extremity wound. She has been Tolerating the wraps without complication. No fevers, chills, nausea, or vomiting noted at this time. She is pleased with [name] ([id number]) how things are progressing. Early complaint is that she is unable to take  a shower for such a long time from Wednesday all the way till the following Monday. 03/31/2017 - the patient and her nurse were concerned about the redness of the wound and the discharge from the wound but other than that everything else has been okay and she has been doing fairly well. She has no constitutional symptoms 05/05/2017 -- she has made excellent progress and overall is very pleased. I believe if she continues at this rate we will be able to put her in her own compression stockings soon. 06/02/2017 -- she has started having pain again in her leg and it is swollen quite a bit and she says there is more redness. She is concerned that the econazole has been causing her and allergy. Electronic Signature(s) Signed: 06/02/2017 2:39:19 PM By: Laura Fudge MD, FACS Entered By: Laura [name] on 06/02/2017 14:39:19 [name] ([id number]) -------------------------------------------------------------------------------- Physical Exam Details Patient Name: Virrueta, Latera [name]. Date of Service: 06/02/2017 1:30 PM Medical Record Number: [id number] Patient Account Number: [id number] Date of Birth/Sex: [birth year] (83 y.o. Female) Treating RN: Laura [name] Primary Care Provider: Halina [name] Other Clinician: Referring Provider: Halina [name] Treating Provider/Extender: Laura [name] in Treatment: 22 Constitutional . Pulse regular. Respirations normal and unlabored. Afebrile. . Eyes Nonicteric. Reactive to light. Ears, Nose, Mouth, and Throat Lips, teeth, and gums WNL.Marland Kitchen Moist mucosa without lesions. Neck supple and nontender. No palpable supraclavicular or cervical adenopathy. Normal sized without goiter.  Respiratory WNL. No retractions.. Cardiovascular Pedal Pulses WNL. No clubbing, cyanosis or edema. Lymphatic No adneopathy. No adenopathy. No adenopathy. Musculoskeletal Adexa without tenderness or enlargement.. Digits and nails w/o clubbing, cyanosis, infection, petechiae,  ischemia, or inflammatory conditions.. Integumentary (Hair, Skin) No suspicious lesions. No crepitus or fluctuance. No peri-wound warmth or erythema. No masses.Marland Kitchen Psychiatric Judgement and insight Intact.. No evidence of depression, anxiety, or agitation.. Notes today her lymphedema has increased significantly and the wound has a lot of redness partially due to fungal infection but she has got some proximal streaking which may be a mild cellulitis. The wound itself is looking clean and there is no evidence of any purulent drainage Electronic Signature(s) Signed: 06/02/2017 2:40:08 PM By: Laura Fudge MD, FACS Entered By: Laura [name] on 06/02/2017 14:40:08 [name] ([id number]) -------------------------------------------------------------------------------- Physician Orders Details Patient Name: Urbanek, Dasja [name]. Date of Service: 06/02/2017 1:30 PM Medical Record Number: [id number] Patient Account Number: [id number] Date of Birth/Sex: 11-04-1924 (83 y.o. Female) Treating RN: Carolyne Fiscal, Debi Primary Care Provider: Halina [name] Other Clinician: Referring Provider: Halina [name] Treating Provider/Extender: Laura [name] in Treatment: 80 Verbal / Phone Orders: Yes Clinician: Carolyne Fiscal, Debi Read Back and Verified: Yes Diagnosis Coding Wound Cleansing Wound #1 Left,Lateral Lower Leg o Clean wound with wound cleanser. o Cleanse wound with mild soap and water o May Shower, gently pat wound dry prior to applying new dressing. o Other: - pt may take wrap off and shower before nurse comes or before she comes to clinic Anesthetic (add to Medication List) Wound #1 Left,Lateral Lower Leg o Topical Lidocaine 4% cream applied to wound bed prior to debridement (In Clinic Only). Skin Barriers/Peri-Wound Care Wound #1 Left,Lateral Lower Leg o Antifungal cream - to red areas around wound (do not place on the ulcer itself) ****not a window****completely cover the reddened  area around the wound (peri-wound area) Primary Wound Dressing Wound #1 Left,Lateral Lower Leg o Other: - cutimed sorbact Secondary Dressing Wound #1 Left,Lateral Lower Leg o ABD pad o Dry Gauze o Drawtex Dressing Change Frequency o Change Dressing Monday, Wednesday, Friday Follow-up Appointments o Return Appointment in 1 week. Edema Control Wound #1 Left,Lateral Lower Leg o 3 Layer Compression System - Left Lower Extremity - Please wrap 3cm from toes and 3cm from knee. unna to anchor o Elevate legs to the level of the heart and pump ankles as often as possible Additional Orders / Instructions Wound #1 Left,Lateral Lower Leg o Increase protein intake. [name] ([id number]) Home Health Wound #1 Left,Lateral Lower Leg o [place] Nurse may visit PRN to address patientos wound care needs. o FACE TO FACE ENCOUNTER: MEDICARE and MEDICAID PATIENTS: I certify that this patient is under my care and that I had a face-to-face encounter that meets the physician face-to-face encounter requirements with this patient on this date. The encounter with the patient was in whole or in part for the following MEDICAL CONDITION: (primary reason for [place]) MEDICAL NECESSITY: I certify, that based on my findings, NURSING services are a medically necessary home health service. HOME BOUND STATUS: I certify that my clinical findings support that this patient is homebound (i.e., Due to illness or injury, pt requires aid of supportive devices such as crutches, cane, wheelchairs, walkers, the use of special transportation or the assistance of another person to leave their place of residence. There is a normal inability to leave the home and doing so requires considerable and taxing effort. Other absences are for  medical reasons / religious services and are infrequent or of short duration when for other reasons). o If current dressing causes  regression in wound condition, may D/C ordered dressing product/s and apply Normal Saline Moist Dressing daily until next [place] / Other MD appointment. [place] of regression in wound condition at [phone]. o Please direct any NON-WOUND related issues/requests for orders to patient's Primary Care Physician Medications-please add to medication list. Wound #1 Left,Lateral Lower Leg o Other: - Vitamin A, Vitamin C, Zinc, MVI Patient Medications Allergies: Augmentin, calcium channel blockers, nitrofurantoin, ACE Inhibitors, atorvastatin, Beta-Blockers (Beta-Adrenergic Blocking Agts), Levaquin, pravastatin, Statins-Hmg-Coa Reductase Inhibitors, sulfa, clindamycin, lincomycin, SILVER Notifications Medication Indication Start End lidocaine DOSE 1 - topical 4 % cream - 1 cream topical Electronic Signature(s) Signed: 06/05/2017 4:44:52 PM By: Laura Fudge MD, FACS Signed: 06/05/2017 5:07:23 PM By: Alric Quan Previous Signature: 06/02/2017 4:49:12 PM Version By: Laura Fudge MD, FACS Previous Signature: 06/02/2017 4:53:17 PM Version By: Alric Quan Previous Signature: 06/02/2017 2:26:26 PM Version By: Laura Fudge MD, FACS Entered By: Alric Quan on 06/05/2017 16:15:35 [name] ([id number]) -------------------------------------------------------------------------------- Prescription 06/02/2017 Patient Name: Azzie [name] Provider: Christin Fudge MD Date of Birth: [birth year] NPI#: [id number] Sex: [name] DEA#: [id number] Phone #: [phone] License #: Patient Address: [facility] [place], [place] [address] [address] [place], [place] [address] [phone] Allergies Augmentin calcium channel blockers nitrofurantoin ACE Inhibitors atorvastatin Beta-Blockers (Beta-Adrenergic Blocking Agts) Levaquin pravastatin Statins-Hmg-Coa Reductase  Inhibitors sulfa clindamycin lincomycin SILVER Medication Medication: Route: Strength: Form: lidocaine 4 % topical cream topical 4% cream [name] ([id number]) Class: TOPICAL LOCAL ANESTHETICS Dose: Frequency / Time: Indication: 1 1 cream topical Number of Refills: Number of Units: 0 Generic Substitution: Start Date: End Date: One Time Use: Substitution Permitted No Note to Pharmacy: Signature(s): Date(s): Electronic Signature(s) Signed: 06/05/2017 4:44:52 PM By: Laura Fudge MD, FACS Signed: 06/05/2017 5:07:23 PM By: Alric Quan Previous Signature: 06/02/2017 4:49:12 PM Version By: Laura Fudge MD, FACS Previous Signature: 06/02/2017 4:53:17 PM Version By: Alric Quan Entered By: Alric Quan on 06/05/2017 16:15:36 [name] ([id number]) --------------------------------------------------------------------------------  Problem List Details Patient Name: [name] Date of Service: 06/02/2017 1:30 PM Medical Record Number: [id number] Patient Account Number: [id number] Date of Birth/Sex: 19-Mar-1925 (83 y.o. Female) Treating RN: Carolyne Fiscal, Debi Primary Care Provider: Halina [name] Other Clinician: Referring Provider: Halina [name] Treating Provider/Extender: Laura [name] in Treatment: 22 Active Problems ICD-10 Encounter Code Description Active Date Diagnosis E11.622 Type 2 diabetes mellitus with other skin ulcer 01/02/2017 Yes I10 Essential (primary) hypertension 01/02/2017 Yes I89.0 Lymphedema, not elsewhere classified 01/16/2017 Yes Inactive Problems Resolved Problems ICD-10 Code Description Active Date Resolved Date L03.116 Cellulitis of left lower limb 01/02/2017 01/02/2017 Electronic Signature(s) Signed: 06/02/2017 2:37:58 PM By: Laura Fudge MD, FACS Entered By: Laura [name] on 06/02/2017 14:37:57 Anglin, Jaretssi [name]. ([id number]) -------------------------------------------------------------------------------- Progress Note  Details Patient Name: Torian, [name] Date of Service: 06/02/2017 1:30 PM Medical Record Number: [id number] Patient Account Number: [id number] Date of Birth/Sex: 08/15/1924 (83 y.o. Female) Treating RN: Carolyne Fiscal, Debi Primary Care Provider: Halina [name] Other Clinician: Referring Provider: Halina [name] Treating Provider/Extender: Laura [name] in Treatment: 22 Subjective Chief Complaint Information obtained from Patient Left lower leg cellulitis History of Present Illness (HPI) 12/30/16 Patient presents today for initial evaluation concerning an area over her left lateral ankle which she tells me has been intermittent in nature since 2000. However the current  opening has been present for about two weeks. She has been tolerating the dressing changes at home with antibiotic ointment but that is really the only treatment that has been initiated at this point. That is other than the oral antibiotics which was Keflex that patient was placed on by her primary care provider prior to being referred to [place]. She does have some discomfort but rates this to be around a 2-3 out of 10 fortunately the redness does not seem to be spreading to any other location as far as her lower extremity is concerned. She does tell me that in the past antibiotics have been beneficial for her unfortunately the current antibiotics have not been of benefit and no wound culture was performed as of yet. She has previously seen Dr. 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 She does have diabetes and are most recent blood sugars have run between 104 and 106 according to patient's although her last hemoglobin A1c she is unaware of. Patient's current wound does not appear to be significantly open but is rather more of a weeping region of  cellulitis. 01/27/17 on evaluation today patient left lower for many wound continues to show signs of erythema surrounding and in fact she is noted to have erythema from her toes up to just below the knee in regard to left lower extremity. She is not having any fevers, chills, nausea, vomiting, or diarrhea at this point. With that being said she is concerned about the silver alginate dressing. She tells me that in the past she used silver and some of the dressings for previous one and did not do well with it. Nonetheless she has discomfort along the wound itself but no other pain noted throughout the left lower extremity. 02/03/17 on evaluation today patient appears to be doing fairly well overall other than the fact that her wound is worsening on the lateral aspect of her left lower extremity. It appears more macerated and even slough covered at this point. With that being said she has been attempting to perform the dressing changes on her own although I'm not sure that she is understanding exactly how this should be done. Nonetheless I do believe the gentamicin may be causing too much maceration she really has not wanted to utilize the silver alginate dressings past due to a previous issue with this. Overall her erythema of the left lower extremity is improved she still has significant swelling. No fevers, chills, nausea, or vomiting noted at this time. 02/10/2017 -- Old notes received -- was seen in 2009 by [facility] by Dr. Romie [name] -- his impression was chronic patches of dermatitis on the left lower extremity and current mild swelling. He recommended venous insufficiency venous reflux studies. She had no evidence of arterial insufficiency. Follow-up chronic venous insufficiency study done on 12/16/2007, showed very mild amount of reflux on the great saphenous vein at the knee but the function of the vein is normal and there was no evidence by the venous reflux study.. They referred her  to dermatology for a second opinion and would see her back in about 4-6 weeks. She was seen back in follow-up in January 01 2008 and at that time she was asked to use a steroid ointment locally and the wounds are almost completely healed. The patient has told me  today that she does not tolerate silver dressing -- and she has not had home health come and change her dressings over the last week. 02/17/2017 -- - had a lower arterial study done which showed a bilateral ABI suggests no significant lower extremity arterial disease and the toe brachial indicis were normal on the right and abnormal on the left. The right ABI was 1.0 and the left was 1.07. The right toe brachial indices was 0.81 on the right and 0.56 on the left. She had biphasic and triphasic flow at the tibial [place], Amya [name]. ([id number]) vessels. 02/24/2017 -- the patient has been tolerating a 3 layer wrap and seems to be pleased with her progress 03/17/17 on evaluation today patient appears to be doing better in regard to her left lateral lower extremity wound. She has been Tolerating the wraps without complication. No fevers, chills, nausea, or vomiting noted at this time. She is pleased with how things are progressing. Early complaint is that she is unable to take a shower for such a long time from Wednesday all the way till the following Monday. 03/31/2017 - the patient and her nurse were concerned about the redness of the wound and the discharge from the wound but other than that everything else has been okay and she has been doing fairly well. She has no constitutional symptoms 05/05/2017 -- she has made excellent progress and overall is very pleased. I believe if she continues at this rate we will be able to put her in her own compression stockings soon. 06/02/2017 -- she has started having pain again in her leg and it is swollen quite a bit and she says there is more redness. She is concerned that the econazole has been causing her and  allergy. Patient History Information obtained from Patient. Family History Cancer - Siblings,Child,Maternal Grandparents, Diabetes - Mother,Siblings, Heart Disease - Father, Hypertension - Siblings, Stroke - Father,Mother, Thyroid Problems - Child, No family history of Hereditary Spherocytosis, Kidney Disease, Lung Disease, Seizures, Tuberculosis. Social History Former smoker - smoked late teens early 61s, Marital Status - Widowed, Alcohol Use - Never, Drug Use - No History, Caffeine Use - Daily. Objective Constitutional Pulse regular. Respirations normal and unlabored. Afebrile. Vitals Time Taken: 1:56 PM, Height: 63 in, Weight: 164.8 lbs, BMI: 29.2, Pulse: 75 bpm, Respiratory Rate: 16 breaths/min, Blood Pressure: 120/97 mmHg. Eyes Nonicteric. Reactive to light. Ears, Nose, Mouth, and Throat Lips, teeth, and gums WNL.Marland Kitchen Moist mucosa without lesions. Neck supple and nontender. No palpable supraclavicular or cervical adenopathy. Normal sized without goiter. Respiratory Albornoz, Dhanya [name]. ([id number]) WNL. No retractions.. Cardiovascular Pedal Pulses WNL. No clubbing, cyanosis or edema. Lymphatic No adneopathy. No adenopathy. No adenopathy. Musculoskeletal Adexa without tenderness or enlargement.. Digits and nails w/o clubbing, cyanosis, infection, petechiae, ischemia, or inflammatory conditions.Marland Kitchen Psychiatric Judgement and insight Intact.. No evidence of depression, anxiety, or agitation.. General Notes: today her lymphedema has increased significantly and the wound has a lot of redness partially due to fungal infection but she has got some proximal streaking which may be a mild cellulitis. The wound itself is looking clean and there is no evidence of any purulent drainage Integumentary (Hair, Skin) No suspicious lesions. No crepitus or fluctuance. No peri-wound warmth or erythema. No masses.. Wound #1 status is Open. Original cause of wound was Gradually Appeared. The wound is located on  the Left,Lateral Lower Leg. The wound measures 2.5cm length x 4.2cm width x 0.1cm depth; 8.247cm^2 area and 0.825cm^3 volume. There is Fat Layer (Subcutaneous Tissue)  Exposed exposed. There is no tunneling or undermining noted. There is a large amount of serous drainage noted. The wound margin is flat and intact. There is small (1-33%) red granulation within the wound bed. There is a large (67-100%) amount of necrotic tissue within the wound bed including Adherent Slough. The periwound skin appearance exhibited: Excoriation, Maceration, Erythema. The periwound skin appearance did not exhibit: Callus, Crepitus, Induration, Rash, Scarring, Dry/Scaly, Atrophie Blanche, Cyanosis, Ecchymosis, Hemosiderin Staining, Mottled, Pallor, Rubor. The surrounding wound skin color is noted with erythema which is circumferential. Periwound temperature was noted as No Abnormality. The periwound has tenderness on palpation. Assessment Active Problems ICD-10 E11.622 - Type 2 diabetes mellitus with other skin ulcer I10 - Essential (primary) hypertension I89.0 - Lymphedema, not elsewhere classified Plan Wound Cleansing: Wound #1 Left,Lateral Lower Leg: Clean wound with wound cleanser. Cleanse wound with mild soap and water May Shower, gently pat wound dry prior to applying new dressing. Other: - pt may take wrap off and shower before nurse comes or before she comes to clinic Quillin, Asucena [name]. ([id number]) Anesthetic (add to Medication List): Wound #1 Left,Lateral Lower Leg: Topical Lidocaine 4% cream applied to wound bed prior to debridement (In Clinic Only). Skin Barriers/Peri-Wound Care: Wound #1 Left,Lateral Lower Leg: Antifungal cream - to red areas around wound (do not place on the ulcer itself) ****not a window****completely cover the reddened area around the wound (peri-wound area) Primary Wound Dressing: Wound #1 Left,Lateral Lower Leg: Other: - cutimed sorbact Secondary Dressing: Wound #1 Left,Lateral  Lower Leg: ABD pad Dry Gauze Drawtex Dressing Change Frequency: Change Dressing Monday, Wednesday, Friday Follow-up Appointments: Return Appointment in 1 week. Edema Control: Wound #1 Left,Lateral Lower Leg: 3 Layer Compression System - Left Lower Extremity - Please wrap 3cm from toes and 3cm from knee. unna to anchor Elevate legs to the level of the heart and pump ankles as often as possible Additional Orders / Instructions: Wound #1 Left,Lateral Lower Leg: Increase protein intake. Home Health: Wound #1 Left,Lateral Lower Leg: Rice Nurse may visit PRN to address patient s wound care needs. FACE TO FACE ENCOUNTER: MEDICARE and MEDICAID PATIENTS: I certify that this patient is under my care and that I had a face-to-face encounter that meets the physician face-to-face encounter requirements with this patient on this date. The encounter with the patient was in whole or in part for the following MEDICAL CONDITION: (primary reason for [place]) MEDICAL NECESSITY: I certify, that based on my findings, NURSING services are a medically necessary home health service. HOME BOUND STATUS: I certify that my clinical findings support that this patient is homebound (i.e., Due to illness or injury, pt requires aid of supportive devices such as crutches, cane, wheelchairs, walkers, the use of special transportation or the assistance of another person to leave their place of residence. There is a normal inability to leave the home and doing so requires considerable and taxing effort. Other absences are for medical reasons / religious services and are infrequent or of short duration when for other reasons). If current dressing causes regression in wound condition, may D/C ordered dressing product/s and apply Normal Saline Moist Dressing daily until next [place] / Other MD appointment. [place] of regression in wound condition at  [phone]. Please direct any NON-WOUND related issues/requests for orders to patient's Primary Care Physician Medications-please add to medication list.: Wound #1 Left,Lateral Lower Leg: Other: - Vitamin A, Vitamin C, Zinc, MVI The following medication(s)  was prescribed: lidocaine topical 4 % cream 1 1 cream topical after review today, the patient is not doing so well, and I have empirically put her on doxycycline 100 mg twice a day for 2 weeks. I have also recommended: 1. Sorbact with foam and a 3 layer Profore wrap to be changed 3 times a week. 2. we will use a rim of Clobetesol around her main wound -- this is what her dermatologist has prescribed for her Skow, Riyanna [name]. ([id number]) 3. Elevation and exercise has been discussed with her she had several questions which I have answered to her satisfaction. she will come back and see as every couple of weeks Electronic Signature(s) Signed: 06/05/2017 4:40:26 PM By: Laura Fudge MD, FACS Previous Signature: 06/02/2017 2:41:52 PM Version By: Laura Fudge MD, FACS Entered By: Laura [name] on 06/05/2017 16:40:26 Niswander, Celine [name]. ([id number]) -------------------------------------------------------------------------------- ROS/PFSH Details Patient Name: Peavy, Matilda [name]. Date of Service: 06/02/2017 1:30 PM Medical Record Number: [id number] Patient Account Number: [id number] Date of Birth/Sex: [birth year] (83 y.o. Female) Treating RN: Carolyne Fiscal, Debi Primary Care Provider: Halina [name] Other Clinician: Referring Provider: Halina [name] Treating Provider/Extender: Laura [name] in Treatment: 22 Information Obtained From Patient Wound History Do you currently have one or more open woundso Yes How many open wounds do you currently haveo 1 Approximately how long have you had your woundso 2 weeks How have you been treating your wound(s) until nowo clobetasol Has your wound(s) ever healed and then re-openedo Yes Have you had any lab work done  in the past montho No Have you tested positive for an antibiotic resistant organism (MRSA, VRE)o No Have you tested positive for osteomyelitis (bone infection)o Yes Have you had any tests for circulation on your legso Yes Where was the test doneo [facility] Have you had other problems associated with your woundso Swelling Endocrine Medical History: Positive for: Type II Diabetes Time with diabetes: 2003 Treated with: Oral agents Immunizations Pneumococcal Vaccine: Received Pneumococcal Vaccination: Yes Implantable Devices Family and Social History Cancer: Yes - Siblings,Child,Maternal Grandparents; Diabetes: Yes - Mother,Siblings; Heart Disease: Yes - Father; Hereditary Spherocytosis: No; Hypertension: Yes - Siblings; Kidney Disease: No; Lung Disease: No; Seizures: No; Stroke: Yes - Father,Mother; Thyroid Problems: Yes - Child; Tuberculosis: No; Former smoker - smoked late teens early 78s; Marital Status - Widowed; Alcohol Use: Never; Drug Use: No History; Caffeine Use: Daily; Financial Concerns: No; Food, Clothing or Shelter Needs: No; Support System Lacking: No; Transportation Concerns: No; Advanced Directives: No; Patient does not want information on Advanced Directives; Do not resuscitate: No; Living Will: Yes (Not Provided); Medical Power of Attorney: Yes (Not Provided) Physician Affirmation I have reviewed and agree with the above information. Electronic Signature(s) Signed: 06/02/2017 4:49:12 PM By: Laura Fudge MD, FACS Signed: 06/02/2017 4:53:17 PM By: Alric Quan Entered By: Laura [name] on 06/02/2017 14:39:26 Kidd, Kiauna [name]. ([id number]) -------------------------------------------------------------------------------- SuperBill Details Patient Name: Cassels, Lynnann [name]. Date of Service: 06/02/2017 Medical Record Number: [id number] Patient Account Number: [id number] Date of Birth/Sex: 08-Jun-1925 (83 y.o. Female) Treating RN: Carolyne Fiscal, Debi Primary Care Provider: Halina [name]  Other Clinician: Referring Provider: Halina [name] Treating Provider/Extender: Laura [name] in Treatment: 22 Diagnosis Coding ICD-10 Codes Code Description E11.622 Type 2 diabetes mellitus with other skin ulcer I10 Essential (primary) hypertension I89.0 Lymphedema, not elsewhere classified Facility Procedures CPT4 Code: 56314970 Description: (Facility Use Only) [phone] - APPLY MULTLAY COMPRS LWR LT LEG Modifier: Quantity: 1 Physician Procedures CPT4 Code: 8502774 Description: 12878 - WC PHYS LEVEL 3 - EST PT ICD-10  Diagnosis Description E11.622 Type 2 diabetes mellitus with other skin ulcer I10 Essential (primary) hypertension I89.0 Lymphedema, not elsewhere classified Modifier: Quantity: 1 Electronic Signature(s) Signed: 06/02/2017 4:41:07 PM By: Alric Quan Signed: 06/02/2017 4:49:12 PM By: Laura Fudge MD, FACS Previous Signature: 06/02/2017 2:42:04 PM Version By: Laura Fudge MD, FACS Entered By: Alric Quan on 06/02/2017 16:41:07

## 2017-06-06 NOTE — Progress Notes (Signed)
[name], [name] ([id number]) Visit Report for 06/02/2017 Arrival Information Details [name], [name] Date of Service: 06/02/2017 1:30 PM Medical Record Number: [id number] Patient Account Number: [id number] Date of Birth/Sex: [birth year] (83 y.o. Female) Treating RN: Carolyne Fiscal, Debi Primary Care Taylen Wendland: Halina Maidens Other Clinician: Referring Diavian Furgason: Halina Maidens Treating Spiro Ausborn/Extender: Frann Rider in Treatment: 81 Visit Information History Since Last Visit All ordered tests and consults were completed: No Patient Arrived: Laura [name] Added or deleted any medications: No Arrival Time: 13:54 Any new allergies or adverse reactions: No Accompanied By: friend Had a fall or experienced change in No Transfer Assistance: EasyPivot Patient activities of daily living that may affect Lift risk of falls: Patient Identification Verified: Yes Signs or symptoms of abuse/neglect since last visito No Secondary Verification Process Yes Hospitalized since last visit: No Completed: Has Dressing in Place as Prescribed: Yes Patient Requires Transmission-Based No Precautions: Pain Present Now: No Patient Has Alerts: Yes Patient Alerts: DM II Electronic Signature(s) Signed: 06/02/2017 4:53:17 PM By: Alric Quan Entered By: Alric Quan on 06/02/2017 13:56:47 [name], [name] ([id number]) -------------------------------------------------------------------------------- Encounter Discharge Information Details [name], [name] Date of Service: 06/02/2017 1:30 PM Medical Record Number: [id number] Patient Account Number: [id number] Date of Birth/Sex: 23-Mar-1925 (83 y.o. Female) Treating RN: Carolyne Fiscal, Debi Primary Care Katerin Negrete: Halina Maidens Other Clinician: Referring Yoan Sallade: Halina Maidens Treating Phallon Haydu/Extender: Frann Rider in Treatment: 62 Encounter Discharge Information Items Discharge Pain Level: 0 Discharge Condition: Stable Ambulatory  Status: Cane Discharge Destination: Home Transportation: Private Auto Accompanied By: friend Schedule Follow-up Appointment: Yes Medication Reconciliation completed and No provided to Patient/Care Carrissa Taitano: Provided on Clinical Summary of Care: 06/02/2017 Form Type Recipient Paper Patient [facility] Electronic Signature(s) Signed: 06/06/2017 11:15:46 AM By: Ruthine Dose Entered By: Ruthine Dose on 06/02/2017 14:38:56 [name], [name] ([id number]) -------------------------------------------------------------------------------- Lower Extremity Assessment Details Patient Name: [name], [name] Date of Service: 06/02/2017 1:30 PM Medical Record Number: [id number] Patient Account Number: [id number] Date of Birth/Sex: 08/20/1924 (83 y.o. Female) Treating RN: Carolyne Fiscal, Debi Primary Care Elinore Shults: Halina Maidens Other Clinician: Referring Kennth Vanbenschoten: Halina Maidens Treating Kaydyn Chism/Extender: Frann Rider in Treatment: 22 Edema Assessment Assessed: Shirlyn Goltz: No] Patrice Paradise: No] [Left: Edema] [Right: :] Calf Left: Right: Point of Measurement: cm From Medial Instep 33.8 cm cm Ankle Left: Right: Point of Measurement: cm From Medial Instep 23 cm cm Vascular Assessment Pulses: Dorsalis Pedis Palpable: [Left:Yes] Posterior Tibial Extremity colors, hair growth, and conditions: Extremity Color: [Left:Red] Temperature of Extremity: [Left:Warm] Capillary Refill: [Left:< 3 seconds] Toe Nail Assessment Left: Right: Thick: No Discolored: No Deformed: No Improper Length and Hygiene: No Electronic Signature(s) Signed: 06/02/2017 4:53:17 PM By: Alric Quan Entered By: Alric Quan on 06/02/2017 14:04:43 [name], [name] ([id number]) -------------------------------------------------------------------------------- Multi Wound Chart Details [name], [name] Date of Service: 06/02/2017 1:30 PM Medical Record Number: [id number] Patient Account Number: [id number] Date of Birth/Sex:  1924/11/23 (83 y.o. Female) Treating RN: Carolyne Fiscal, Debi Primary Care Klay Sobotka: Halina Maidens Other Clinician: Referring Gearldean Lomanto: Halina Maidens Treating Janylah Belgrave/Extender: Frann Rider in Treatment: 22 Vital Signs Height(in): 63 Pulse(bpm): 75 Weight(lbs): 164.8 Blood Pressure(mmHg): 120/97 Body Mass Index(BMI): 29 Temperature(F): Respiratory Rate 16 (breaths/min): Photos: [1:No Photos] [N/A:N/A] Wound Location: [1:Left Lower Leg - Lateral] [N/A:N/A] Wounding Event: [1:Gradually Appeared] [N/A:N/A] Primary Etiology: [1:Diabetic Wound/Ulcer of the Lower Extremity] [N/A:N/A] Comorbid History: [1:Type II Diabetes] [N/A:N/A] Date Acquired: [1:12/16/2016] [N/A:N/A] Weeks of Treatment: [1:22] [N/A:N/A] Wound Status: [1:Open] [N/A:N/A] Measurements L x W x D [1:2.5x4.2x0.1] [N/A:N/A] (cm) Area (cm) : [1:8.247] [N/A:N/A] Volume (cm) : [  1:0.825] [N/A:N/A] % Reduction in Area: [1:-739.80%] [N/A:N/A] % Reduction in Volume: [1:-741.80%] [N/A:N/A] Classification: [1:Grade 1] [N/A:N/A] Exudate Amount: [1:Large] [N/A:N/A] Exudate Type: [1:Serous] [N/A:N/A] Exudate Color: [1:amber] [N/A:N/A] Wound Margin: [1:Flat and Intact] [N/A:N/A] Granulation Amount: [1:Small (1-33%)] [N/A:N/A] Granulation Quality: [1:Red] [N/A:N/A] Necrotic Amount: [1:Large (67-100%)] [N/A:N/A] Exposed Structures: [1:Fat Layer (Subcutaneous Tissue) Exposed: Yes Fascia: No Tendon: No Muscle: No Joint: No Bone: No] [N/A:N/A] Epithelialization: [1:Small (1-33%)] [N/A:N/A] Periwound Skin Texture: [1:Excoriation: Yes Induration: No Callus: No Crepitus: No Rash: No Scarring: No] [N/A:N/A] Periwound Skin Moisture: Maceration: Yes N/A N/A Dry/Scaly: No Periwound Skin Color: Erythema: Yes N/A N/A Atrophie Blanche: No Cyanosis: No Ecchymosis: No Hemosiderin Staining: No Mottled: No Pallor: No Rubor: No Erythema Location: Circumferential N/A N/A Temperature: No Abnormality N/A N/A Tenderness on  Palpation: Yes N/A N/A Wound Preparation: Ulcer Cleansing: N/A N/A Rinsed/Irrigated with Saline, Other: soap and water Topical Anesthetic Applied: Other: lidocaine 4% Treatment Notes Electronic Signature(s) Signed: 06/02/2017 2:38:07 PM By: Christin Fudge MD, FACS Entered By: Christin Fudge on 06/02/2017 14:38:06 Kruczek, Tanita F. ([id number]) -------------------------------------------------------------------------------- [place] Details Patient Name: Azzie [name], [name] Date of Service: 06/02/2017 1:30 PM Medical Record Number: [id number] Patient Account Number: [id number] Date of Birth/Sex: 12/21/1924 (83 y.o. Female) Treating RN: Carolyne Fiscal, Debi Primary Care Theodora Lalanne: Halina Maidens Other Clinician: Referring Jenille Laszlo: Halina Maidens Treating Maeva Dant/Extender: Frann Rider in Treatment: 22 Active Inactive ` Abuse / Safety / Falls / Self Care Management Nursing Diagnoses: Potential for falls Goals: Patient will not experience any injury related to falls Date Initiated: 12/30/2016 Target Resolution Date: 04/29/2017 Goal Status: Active Interventions: Assess Activities of Daily Living upon admission and as needed Assess: immobility, friction, shearing, incontinence upon admission and as needed Notes: ` Nutrition Nursing Diagnoses: Imbalanced nutrition Impaired glucose control: actual or potential Potential for alteratiion in Nutrition/Potential for imbalanced nutrition Goals: Patient/caregiver will maintain therapeutic glucose control Date Initiated: 12/30/2016 Target Resolution Date: 04/29/2017 Goal Status: Active Interventions: Assess patient nutrition upon admission and as needed per policy Notes: ` Orientation to the Wound Care Program Nursing Diagnoses: Knowledge deficit related to the wound healing center program Goals: Patient/caregiver will verbalize understanding of the [place] Date Initiated: 12/30/2016 Target  Resolution Date: 01/21/2017 Sebek, Aislynn Wanda Plump ([id number]) Goal Status: Active Interventions: Provide education on orientation to the wound center Notes: ` Pain, Acute or Chronic Nursing Diagnoses: Pain, acute or chronic: actual or potential Potential alteration in comfort, pain Goals: Patient/caregiver will verbalize adequate pain control between visits Date Initiated: 12/30/2016 Target Resolution Date: 04/29/2017 Goal Status: Active Interventions: Complete pain assessment as per visit requirements Notes: ` Wound/Skin Impairment Nursing Diagnoses: Impaired tissue integrity Knowledge deficit related to smoking impact on wound healing Knowledge deficit related to ulceration/compromised skin integrity Goals: Ulcer/skin breakdown will have a volume reduction of 80% by week 12 Date Initiated: 12/30/2016 Target Resolution Date: 04/22/2017 Goal Status: Active Interventions: Assess patient/caregiver ability to perform ulcer/skin care regimen upon admission and as needed Notes: Electronic Signature(s) Signed: 06/02/2017 4:53:17 PM By: Alric Quan Entered By: Alric Quan on 06/02/2017 14:04:55 [name], Jahnaya F. ([id number]) -------------------------------------------------------------------------------- Pain Assessment Details [name], [name] Date of Service: 06/02/2017 1:30 PM Medical Record Number: [id number] Patient Account Number: [id number] Date of Birth/Sex: Apr 14, 1925 (83 y.o. Female) Treating RN: Ahmed Prima Primary Care Madalaine Portier: Halina Maidens Other Clinician: Referring Jossiah Smoak: Halina Maidens Treating Brinda Focht/Extender: Frann Rider in Treatment: 22 Active Problems Location of Pain Severity and Description of Pain Patient Has Paino No Site Locations Pain Management and Medication Current  Pain Management: Electronic Signature(s) Signed: 06/02/2017 4:53:17 PM By: Alric Quan Entered By: Alric Quan on 06/02/2017 13:56:53 [name], [name]  ([id number]) -------------------------------------------------------------------------------- Patient/Caregiver Education Details Patient Name: Azzie [name], [name] Date of Service: 06/02/2017 1:30 PM Medical Record Number: [id number] Patient Account Number: [id number] Date of Birth/Gender: 1925-04-24 (83 y.o. Female) Treating RN: Ahmed Prima Primary Care Physician: Halina Maidens Other Clinician: Referring Physician: Halina Maidens Treating Physician/Extender: Frann Rider in Treatment: 22 Education Assessment Education Provided To: Patient Education Topics Provided Wound/Skin Impairment: Handouts: Other: change dressing as ordered Methods: Demonstration, Explain/Verbal Responses: State content correctly Electronic Signature(s) Signed: 06/02/2017 4:53:17 PM By: Alric Quan Entered By: Alric Quan on 06/02/2017 14:11:20 [name], [name] ([id number]) -------------------------------------------------------------------------------- Wound Assessment Details Patient Name: Mcgann, Capria F. Date of Service: 06/02/2017 1:30 PM Medical Record Number: [id number] Patient Account Number: [id number] Date of Birth/Sex: 1924/08/07 (83 y.o. Female) Treating RN: Carolyne Fiscal, Debi Primary Care Shawnell Dykes: Halina Maidens Other Clinician: Referring Katisha Shimizu: Halina Maidens Treating Khalil Belote/Extender: Frann Rider in Treatment: 22 Wound Status Wound Number: 1 Primary Etiology: Diabetic Wound/Ulcer of the Lower Extremity Wound Location: Left Lower Leg - Lateral Wound Status: Open Wounding Event: Gradually Appeared Comorbid Type II Diabetes Date Acquired: 12/16/2016 History: Weeks Of Treatment: 22 Clustered Wound: No Photos Photo Uploaded By: Alric Quan on 06/05/2017 15:57:09 Wound Measurements Length: (cm) 2.5 Width: (cm) 4.2 Depth: (cm) 0.1 Area: (cm) 8.247 Volume: (cm) 0.825 % Reduction in Area: -739.8% % Reduction in Volume: -741.8% Epithelialization: Small  (1-33%) Tunneling: No Undermining: No Wound Description Classification: Grade 1 Wound Margin: Flat and Intact Exudate Amount: Large Exudate Type: Serous Exudate Color: amber Foul Odor After Cleansing: No Slough/Fibrino Yes Wound Bed Granulation Amount: Small (1-33%) Exposed Structure Granulation Quality: Red Fascia Exposed: No Necrotic Amount: Large (67-100%) Fat Layer (Subcutaneous Tissue) Exposed: Yes Necrotic Quality: Adherent Slough Tendon Exposed: No Muscle Exposed: No Joint Exposed: No Bone Exposed: No Periwound Skin Texture Danielski, Kristyna F. ([id number]) Texture Color No Abnormalities Noted: No No Abnormalities Noted: No Callus: No Atrophie Blanche: No Crepitus: No Cyanosis: No Excoriation: Yes Ecchymosis: No Induration: No Erythema: Yes Rash: No Erythema Location: Circumferential Scarring: No Hemosiderin Staining: No Mottled: No Moisture Pallor: No No Abnormalities Noted: No Rubor: No Dry / Scaly: No Maceration: Yes Temperature / Pain Temperature: No Abnormality Tenderness on Palpation: Yes Wound Preparation Ulcer Cleansing: Rinsed/Irrigated with Saline, Other: soap and water, Topical Anesthetic Applied: Other: lidocaine 4%, Treatment Notes Wound #1 (Left, Lateral Lower Leg) 1. Cleansed with: Clean wound with Normal Saline 2. Anesthetic Topical Lidocaine 4% cream to wound bed prior to debridement 3. Peri-wound Care: Other peri-wound care (specify in notes) 5. Secondary Dressing Applied ABD Pad 7. Secured with Tape 3 Layer Compression System - Left Lower Extremity Notes unna to anchor, cutimed sorbact, drawtex Electronic Signature(s) Signed: 06/02/2017 4:53:17 PM By: Alric Quan Entered By: Alric Quan on 06/02/2017 14:02:51 Kittell, Anacaren F. ([id number]) -------------------------------------------------------------------------------- Vitals Details Patient Name: Fauver, Adriona F. Date of Service: 06/02/2017 1:30 PM Medical Record Number:  [id number] Patient Account Number: [id number] Date of Birth/Sex: 01/05/1925 (83 y.o. Female) Treating RN: Carolyne Fiscal, Debi Primary Care Adar Rase: Halina Maidens Other Clinician: Referring Maree Ainley: Halina Maidens Treating Devlin Brink/Extender: Frann Rider in Treatment: 22 Vital Signs Time Taken: 13:56 Pulse (bpm): 75 Height (in): 63 Respiratory Rate (breaths/min): 16 Weight (lbs): 164.8 Blood Pressure (mmHg): 120/97 Body Mass Index (BMI): 29.2 Reference Range: 80 - 120 mg / dl Electronic Signature(s) Signed: 06/02/2017 4:53:17 PM By: Alric Quan Entered By: Alric Quan on 06/02/2017 14:00:24

## 2017-06-16 ENCOUNTER — Encounter: Payer: Medicare PPO | Admitting: Surgery

## 2017-06-16 DIAGNOSIS — E11622 Type 2 diabetes mellitus with other skin ulcer: Secondary | ICD-10-CM | POA: Diagnosis not present

## 2017-06-17 NOTE — Progress Notes (Signed)
Laura [name], [name] ([id number]) Visit Report for 06/16/2017 Arrival Information Details Patient Name: HASTINGS, [name] Date of Service: 06/16/2017 1:30 PM Medical Record Number: [id number] Patient Account Number: [id number] Date of Birth/Sex: Dec 10, 1924 (83 y.o. Female) Treating RN: Cornell Barman Primary Care Irbin Fines: Halina Maidens Other Clinician: Referring Anabeth Chilcott: Halina Maidens Treating Willmer Fellers/Extender: Frann Rider in Treatment: 24 Visit Information History Since Last Visit Added or deleted any medications: No Patient Arrived: Laura [name] Any new allergies or adverse reactions: No Arrival Time: 13:50 Had a fall or experienced change in No Accompanied By: friend activities of daily living that may affect Transfer Assistance: Manual risk of falls: Patient Identification Verified: Yes Signs or symptoms of abuse/neglect since last visito No Secondary Verification Process Completed: Yes Hospitalized since last visit: No Patient Requires Transmission-Based Precautions: No Has Dressing in Place as Prescribed: Yes Patient Has Alerts: Yes Pain Present Now: No Patient Alerts: DM II Electronic Signature(s) Signed: 06/16/2017 5:14:22 PM By: Gretta Cool, BSN, RN, CWS, Kim RN, BSN Entered By: Gretta Cool, BSN, RN, CWS, Kim on 06/16/2017 13:50:38 Laura [name], [name] ([id number]) -------------------------------------------------------------------------------- Compression Therapy Details Patient Name: Hetz, Jacob F. Date of Service: 06/16/2017 1:30 PM Medical Record Number: [id number] Patient Account Number: [id number] Date of Birth/Sex: 10/22/1924 (83 y.o. Female) Treating RN: Cornell Barman Primary Care Luana Tatro: Halina Maidens Other Clinician: Referring Teralyn Mullins: Halina Maidens Treating Scotlynn Noyes/Extender: Frann Rider in Treatment: 24 Compression Therapy Performed for Wound Assessment: Wound #1 Left,Lateral Lower Leg Performed By: Clinician Cornell Barman, RN Compression Type: Three Layer Post Procedure  Diagnosis Same as Pre-procedure Electronic Signature(s) Signed: 06/16/2017 5:14:22 PM By: Gretta Cool, BSN, RN, CWS, Kim RN, BSN Entered By: Gretta Cool, BSN, RN, CWS, Kim on 06/16/2017 14:27:07 Aceituno, Joette F. ([id number]) -------------------------------------------------------------------------------- Encounter Discharge Information Details Patient Name: Laura [name], [name] Date of Service: 06/16/2017 1:30 PM Medical Record Number: [id number] Patient Account Number: [id number] Date of Birth/Sex: 16-Sep-1924 (83 y.o. Female) Treating RN: Cornell Barman Primary Care Barrie Sigmund: Halina Maidens Other Clinician: Referring Milia Warth: Halina Maidens Treating Ilijah Doucet/Extender: Frann Rider in Treatment: 24 Encounter Discharge Information Items Discharge Pain Level: 0 Discharge Condition: Stable Ambulatory Status: Ambulatory Discharge Destination: Home Transportation: Private Auto Accompanied By: friend Schedule Follow-up Appointment: Yes Medication Reconciliation completed and Yes provided to Patient/Care Ceirra Belli: Provided on Clinical Summary of Care: 06/16/2017 Form Type Recipient Paper Patient [facility] Electronic Signature(s) Signed: 06/16/2017 5:14:22 PM By: Gretta Cool, BSN, RN, CWS, Kim RN, BSN Entered By: Gretta Cool, BSN, RN, CWS, Kim on 06/16/2017 14:34:55 Bohanon, Dosha F. ([id number]) -------------------------------------------------------------------------------- Lower Extremity Assessment Details Patient Name: Laura [name], [name] Date of Service: 06/16/2017 1:30 PM Medical Record Number: [id number] Patient Account Number: [id number] Date of Birth/Sex: Aug 11, 1924 (83 y.o. Female) Treating RN: Cornell Barman Primary Care Virjean Boman: Halina Maidens Other Clinician: Referring Briggette Najarian: Halina Maidens Treating Salvadore Valvano/Extender: Frann Rider in Treatment: 24 Edema Assessment Assessed: Shirlyn Goltz: No] Patrice Paradise: No] [Left: Edema] [Right: :] Calf Left: Right: Point of Measurement: 34 cm From Medial Instep 33.8 cm  cm Ankle Left: Right: Point of Measurement: 10 cm From Medial Instep 23.5 cm cm Vascular Assessment Pulses: Dorsalis Pedis Palpable: [Left:No] Doppler Audible: [Left:Yes] Posterior Tibial Extremity colors, hair growth, and conditions: Extremity Color: [Left:Red] Hair Growth on Extremity: [Left:Yes] Temperature of Extremity: [Left:Warm] Capillary Refill: [Left:< 3 seconds] Toe Nail Assessment Left: Right: Thick: No Discolored: No Deformed: No Improper Length and Hygiene: No Electronic Signature(s) Signed: 06/16/2017 5:14:22 PM By: Gretta Cool, BSN, RN, CWS, Kim RN, BSN Entered By: Gretta Cool, BSN, RN, CWS, Kim on 06/16/2017 14:03:00 Laura [name], [name] ([id number]) -------------------------------------------------------------------------------- Multi Wound  Chart Details Patient Name: Laura [name], [name] Date of Service: 06/16/2017 1:30 PM Medical Record Number: [id number] Patient Account Number: [id number] Date of Birth/Sex: 1924/11/08 (83 y.o. Female) Treating RN: Cornell Barman Primary Care Heavin Sebree: Halina Maidens Other Clinician: Referring Rollie Hynek: Halina Maidens Treating Tyriq Moragne/Extender: Frann Rider in Treatment: 24 Vital Signs Height(in): 63 Pulse(bpm): 86 Weight(lbs): 164.8 Blood Pressure(mmHg): 137/91 Body Mass Index(BMI): 29 Temperature(F): 98.1 Respiratory Rate 16 (breaths/min): Photos: [N/A:N/A] Wound Location: Left Lower Leg - Lateral N/A N/A Wounding Event: Gradually Appeared N/A N/A Primary Etiology: Diabetic Wound/Ulcer of the N/A N/A Lower Extremity Comorbid History: Type II Diabetes N/A N/A Date Acquired: 12/16/2016 N/A N/A Weeks of Treatment: 24 N/A N/A Wound Status: Open N/A N/A Measurements L x W x D 2.4x2.5x0.1 N/A N/A (cm) Area (cm) : 4.712 N/A N/A Volume (cm) : 0.471 N/A N/A % Reduction in Area: -379.80% N/A N/A % Reduction in Volume: -380.60% N/A N/A Classification: Grade 1 N/A N/A Exudate Amount: Large N/A N/A Exudate Type: Serous N/A  N/A Exudate Color: amber N/A N/A Wound Margin: Flat and Intact N/A N/A Granulation Amount: Small (1-33%) N/A N/A Granulation Quality: Red N/A N/A Necrotic Amount: Large (67-100%) N/A N/A Exposed Structures: Fat Layer (Subcutaneous N/A N/A Tissue) Exposed: Yes Fascia: No Tendon: No Muscle: No Joint: No Bone: No Epithelialization: Small (1-33%) N/A N/A Periwound Skin Texture: N/A N/A Horacek, Elin F. ([id number]) Excoriation: No Induration: No Callus: No Crepitus: No Rash: No Scarring: No Periwound Skin Moisture: Maceration: Yes N/A N/A Dry/Scaly: No Periwound Skin Color: Erythema: Yes N/A N/A Hemosiderin Staining: Yes Atrophie Blanche: No Cyanosis: No Ecchymosis: No Mottled: No Pallor: No Rubor: No Temperature: No Abnormality N/A N/A Tenderness on Palpation: Yes N/A N/A Wound Preparation: Ulcer Cleansing: N/A N/A Rinsed/Irrigated with Saline, Other: soap and water Topical Anesthetic Applied: Other: lidocaine 4% Treatment Notes Electronic Signature(s) Signed: 06/16/2017 2:26:27 PM By: Christin Fudge MD, FACS Entered By: Christin Fudge on 06/16/2017 14:26:26 Laura [name], [name] ([id number]) -------------------------------------------------------------------------------- Obert Details Patient Name: Laura [name], Emmalin F. Date of Service: 06/16/2017 1:30 PM Medical Record Number: [id number] Patient Account Number: [id number] Date of Birth/Sex: [birth year] (83 y.o. Female) Treating RN: Cornell Barman Primary Care Melesa Lecy: Halina Maidens Other Clinician: Referring Ezana Hubbert: Halina Maidens Treating Francesco Provencal/Extender: Frann Rider in Treatment: 24 Active Inactive ` Abuse / Safety / Falls / Self Care Management Nursing Diagnoses: Potential for falls Goals: Patient will not experience any injury related to falls Date Initiated: 12/30/2016 Target Resolution Date: 04/29/2017 Goal Status: Active Interventions: Assess Activities of Daily Living upon admission and  as needed Assess: immobility, friction, shearing, incontinence upon admission and as needed Notes: ` Nutrition Nursing Diagnoses: Imbalanced nutrition Impaired glucose control: actual or potential Potential for alteratiion in Nutrition/Potential for imbalanced nutrition Goals: Patient/caregiver will maintain therapeutic glucose control Date Initiated: 12/30/2016 Target Resolution Date: 04/29/2017 Goal Status: Active Interventions: Assess patient nutrition upon admission and as needed per policy Notes: ` Orientation to the Wound Care Program Nursing Diagnoses: Knowledge deficit related to the wound healing center program Goals: Patient/caregiver will verbalize understanding of the [place] Date Initiated: 12/30/2016 Target Resolution Date: 01/21/2017 ZIMNY, Nishat Wanda Plump ([id number]) Goal Status: Active Interventions: Provide education on orientation to the wound center Notes: ` Pain, Acute or Chronic Nursing Diagnoses: Pain, acute or chronic: actual or potential Potential alteration in comfort, pain Goals: Patient/caregiver will verbalize adequate pain control between visits Date Initiated: 12/30/2016 Target Resolution Date: 04/29/2017 Goal Status: Active Interventions: Complete pain assessment as per visit requirements Notes: ` Wound/Skin  Impairment Nursing Diagnoses: Impaired tissue integrity Knowledge deficit related to smoking impact on wound healing Knowledge deficit related to ulceration/compromised skin integrity Goals: Ulcer/skin breakdown will have a volume reduction of 80% by week 12 Date Initiated: 12/30/2016 Target Resolution Date: 04/22/2017 Goal Status: Active Interventions: Assess patient/caregiver ability to perform ulcer/skin care regimen upon admission and as needed Notes: Electronic Signature(s) Signed: 06/16/2017 5:14:22 PM By: Gretta Cool, BSN, RN, CWS, Kim RN, BSN Entered By: Gretta Cool, BSN, RN, CWS, Kim on 06/16/2017 14:03:08 Laura [name], [name]  ([id number]) -------------------------------------------------------------------------------- Pain Assessment Details Patient Name: Laura [name], Laura [name] F. Date of Service: 06/16/2017 1:30 PM Medical Record Number: [id number] Patient Account Number: [id number] Date of Birth/Sex: [birth year] (83 y.o. Female) Treating RN: Cornell Barman Primary Care Clarita Mcelvain: Halina Maidens Other Clinician: Referring Anjani Feuerborn: Halina Maidens Treating Brielynn Sekula/Extender: Frann Rider in Treatment: 24 Active Problems Location of Pain Severity and Description of Pain Patient Has Paino No Site Locations With Dressing Change: No Pain Management and Medication Current Pain Management: Electronic Signature(s) Signed: 06/16/2017 5:14:22 PM By: Gretta Cool, BSN, RN, CWS, Kim RN, BSN Entered By: Gretta Cool, BSN, RN, CWS, Kim on 06/16/2017 13:50:46 Laura [name], [name] ([id number]) -------------------------------------------------------------------------------- Patient/Caregiver Education Details Patient Name: Laura [name], Sharlot F. Date of Service: 06/16/2017 1:30 PM Medical Record Number: [id number] Patient Account Number: [id number] Date of Birth/Gender: 10/19/1924 (83 y.o. Female) Treating RN: Cornell Barman Primary Care Physician: Halina Maidens Other Clinician: Referring Physician: Halina Maidens Treating Physician/Extender: Frann Rider in Treatment: 24 Education Assessment Education Provided To: Patient Education Topics Provided Wound/Skin Impairment: Handouts: Caring for Your Ulcer Methods: Demonstration Responses: State content correctly Electronic Signature(s) Signed: 06/16/2017 5:14:22 PM By: Gretta Cool, BSN, RN, CWS, Kim RN, BSN Entered By: Gretta Cool, BSN, RN, CWS, Kim on 06/16/2017 14:35:06 Laura [name], [name] ([id number]) -------------------------------------------------------------------------------- Wound Assessment Details Patient Name: Laura [name], [name] Date of Service: 06/16/2017 1:30 PM Medical Record Number: [id number] Patient  Account Number: [id number] Date of Birth/Sex: February 11, 1925 (83 y.o. Female) Treating RN: Cornell Barman Primary Care Kael Keetch: Halina Maidens Other Clinician: Referring Leasia Swann: Halina Maidens Treating Oleda Borski/Extender: Frann Rider in Treatment: 24 Wound Status Wound Number: 1 Primary Etiology: Diabetic Wound/Ulcer of the Lower Extremity Wound Location: Left Lower Leg - Lateral Wound Status: Open Wounding Event: Gradually Appeared Comorbid Type II Diabetes Date Acquired: 12/16/2016 History: Weeks Of Treatment: 24 Clustered Wound: No Photos Wound Measurements Length: (cm) 2.4 Width: (cm) 2.5 Depth: (cm) 0.1 Area: (cm) 4.712 Volume: (cm) 0.471 % Reduction in Area: -379.8% % Reduction in Volume: -380.6% Epithelialization: Small (1-33%) Tunneling: No Undermining: No Wound Description Classification: Grade 1 Foul Od Wound Margin: Flat and Intact Slough/ Exudate Amount: Large Exudate Type: Serous Exudate Color: amber or After Cleansing: No Fibrino Yes Wound Bed Granulation Amount: Small (1-33%) Exposed Structure Granulation Quality: Red Fascia Exposed: No Necrotic Amount: Large (67-100%) Fat Layer (Subcutaneous Tissue) Exposed: Yes Necrotic Quality: Adherent Slough Tendon Exposed: No Muscle Exposed: No Joint Exposed: No Bone Exposed: No Periwound Skin Texture Texture Color No Abnormalities Noted: No No Abnormalities Noted: No Callus: No Atrophie Blanche: No Crepitus: No Cyanosis: No Lindquist, Rayssa F. ([id number]) Excoriation: No Ecchymosis: No Induration: No Erythema: Yes Rash: No Hemosiderin Staining: Yes Scarring: No Mottled: No Pallor: No Moisture Rubor: No No Abnormalities Noted: No Dry / Scaly: No Temperature / Pain Maceration: Yes Temperature: No Abnormality Tenderness on Palpation: Yes Wound Preparation Ulcer Cleansing: Rinsed/Irrigated with Saline, Other: soap and water, Topical Anesthetic Applied: Other: lidocaine 4%, Treatment  Notes Wound #1 (Left, Lateral Lower Leg) 1. Cleansed with: Clean wound with Normal  Saline 2. Anesthetic Topical Lidocaine 4% cream to wound bed prior to debridement 4. Dressing Applied: Other dressing (specify in notes) 7. Secured with 3 Layer Compression System - Left Lower Extremity Notes unna to anchor, cutimed sorbact, drawtex Electronic Signature(s) Signed: 06/16/2017 5:14:22 PM By: Gretta Cool, BSN, RN, CWS, Kim RN, BSN Entered By: Gretta Cool, BSN, RN, CWS, Kim on 06/16/2017 13:58:22 Astarita, Marcee F. ([id number]) -------------------------------------------------------------------------------- Vitals Details Patient Name: Laura [name], Jaleen F. Date of Service: 06/16/2017 1:30 PM Medical Record Number: [id number] Patient Account Number: [id number] Date of Birth/Sex: Nov 06, 1924 (83 y.o. Female) Treating RN: Cornell Barman Primary Care Allina Riches: Halina Maidens Other Clinician: Referring Zacarias Krauter: Halina Maidens Treating Walaa Carel/Extender: Frann Rider in Treatment: 24 Vital Signs Time Taken: 13:50 Temperature (F): 98.1 Height (in): 63 Pulse (bpm): 86 Weight (lbs): 164.8 Respiratory Rate (breaths/min): 16 Body Mass Index (BMI): 29.2 Blood Pressure (mmHg): 137/91 Reference Range: 80 - 120 mg / dl Notes Patient states she took her medications right before heading to her appointment. MD Notified. Patient will check pressure at home and will contact PCP if it remains high. Electronic Signature(s) Signed: 06/16/2017 5:14:22 PM By: Gretta Cool, BSN, RN, CWS, Kim RN, BSN Entered By: Gretta Cool, BSN, RN, CWS, Kim on 06/16/2017 13:54:24

## 2017-06-17 NOTE — Progress Notes (Addendum)
AINA, Alexxus F. ([id number]) Visit Report for 06/16/2017 Chief Complaint Document Details Patient Name: Laura [name], [name] Date of Service: 06/16/2017 1:30 PM Medical Record Number: [id number] Patient Account Number: [id number] Date of Birth/Sex: 1924-12-10 (83 y.o. Female) Treating RN: Cornell Barman Primary Care Provider: Halina Maidens Other Clinician: Referring Provider: Halina Maidens Treating Provider/Extender: Frann Rider in Treatment: 24 Information Obtained from: Patient Chief Complaint Left lower leg cellulitis Electronic Signature(s) Signed: 06/16/2017 2:26:34 PM By: Christin Fudge MD, FACS Entered By: Christin Fudge on 06/16/2017 14:26:34 Leas, Oliviagrace F. ([id number]) -------------------------------------------------------------------------------- HPI Details [name], [name] Date of Service: 06/16/2017 1:30 PM Medical Record Number: [id number] Patient Account Number: [id number] Date of Birth/Sex: May 25, 1925 (83 y.o. Female) Treating RN: Cornell Barman Primary Care Provider: Halina Maidens Other Clinician: Referring Provider: Halina Maidens Treating Provider/Extender: Frann Rider in Treatment: 24 History of Present Illness HPI Description: 12/30/16 Patient presents today for initial evaluation concerning an area over her left lateral ankle which she tells me has been intermittent in nature since 2000. However the current opening has been present for about two weeks. She has been tolerating the dressing changes at home with antibiotic ointment but that is really the only treatment that has been initiated at this point. That is other than the oral antibiotics which was Keflex that patient was placed on by her primary care provider prior to being referred to [place]. She does have some discomfort but rates this to be around a 2-3 out of 10 fortunately the redness does not seem to be spreading to any other location as far as her lower extremity is concerned. She does tell  me that in the past antibiotics have been beneficial for her unfortunately the current antibiotics have not been of benefit and no wound culture was performed as of yet. She has previously seen Dr. 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 She does have diabetes and are most recent blood sugars have run between 104 and 106 according to patient's although her last hemoglobin A1c she is unaware of. Patient's current wound does not appear to be significantly open but is rather more of a weeping region of cellulitis. 01/27/17 on evaluation today patient left lower for many wound continues to show signs of erythema surrounding and in fact she is noted to have erythema from her toes up to just below the knee in regard to left lower extremity. She is not having any fevers, chills, nausea, vomiting, or diarrhea at this point. With that being said she is concerned about the silver alginate dressing. She tells me that in the past she used silver and some of the dressings for previous one and did not do well with it. Nonetheless she has discomfort along the wound itself but no other pain noted throughout the left lower extremity. 02/03/17 on evaluation today patient appears to be doing fairly well overall other than the fact that her wound is worsening on the lateral aspect of her left lower extremity. It appears more macerated and even slough covered at this point. With that being said she has been attempting to perform the dressing changes on her own although I'm not sure that she is understanding exactly how this should be done. Nonetheless I do believe the gentamicin may be causing too much maceration she really has not wanted to utilize  the silver alginate dressings past due to a previous issue with this. Overall her erythema of the  left lower extremity is improved she still has significant swelling. No fevers, chills, nausea, or vomiting noted at this time. 02/10/2017 -- Old notes received -- was seen in 2009 by [facility] by Dr. Romie Levee -- his impression was chronic patches of dermatitis on the left lower extremity and current mild swelling. He recommended venous insufficiency venous reflux studies. She had no evidence of arterial insufficiency. Follow-up chronic venous insufficiency study done on 12/16/2007, showed very mild amount of reflux on the great saphenous vein at the knee but the function of the vein is normal and there was no evidence by the venous reflux study.. They referred her to dermatology for a second opinion and would see her back in about 4-6 weeks. She was seen back in follow-up in January 01 2008 and at that time she was asked to use a steroid ointment locally and the wounds are almost completely healed. The patient has told me today that she does not tolerate silver dressing -- and she has not had home health come and change her dressings over the last week. 02/17/2017 -- - had a lower arterial study done which showed a bilateral ABI suggests no significant lower extremity arterial disease and the toe brachial indicis were normal on the right and abnormal on the left. The right ABI was 1.0 and the left was 1.07. The right toe brachial indices was 0.81 on the right and 0.56 on the left. She had biphasic and triphasic flow at the tibial vessels. 02/24/2017 -- the patient has been tolerating a 3 layer wrap and seems to be pleased with her progress 03/17/17 on evaluation today patient appears to be doing better in regard to her left lateral lower extremity wound. She has been Tolerating the wraps without complication. No fevers, chills, nausea, or vomiting noted at this time. She is pleased with Geibel, Sadira F. ([id number]) how things are progressing. Early complaint is that she is unable to take a  shower for such a long time from Wednesday all the way till the following Monday. 03/31/2017 - the patient and her nurse were concerned about the redness of the wound and the discharge from the wound but other than that everything else has been okay and she has been doing fairly well. She has no constitutional symptoms 05/05/2017 -- she has made excellent progress and overall is very pleased. I believe if she continues at this rate we will be able to put her in her own compression stockings soon. 06/02/2017 -- she has started having pain again in her leg and it is swollen quite a bit and she says there is more redness. She is concerned that the econazole has been causing her and allergy. 06/16/2017 -- she has taken doxycycline as planned and overall has done much better and there is less pain and less redness of her leg. Electronic Signature(s) Signed: 06/16/2017 2:27:00 PM By: Christin Fudge MD, FACS Entered By: Christin Fudge on 06/16/2017 14:26:59 Perot, Sherri F. ([id number]) -------------------------------------------------------------------------------- Physical Exam Details [name], [name] Date of Service: 06/16/2017 1:30 PM Medical Record Number: [id number] Patient Account Number: [id number] Date of Birth/Sex: 1924/09/07 (83 y.o. Female) Treating RN: Cornell Barman Primary Care Provider: Halina Maidens Other Clinician: Referring Provider: Halina Maidens Treating Provider/Extender: Frann Rider in Treatment: 24 Constitutional . Pulse regular. Respirations normal and unlabored. Afebrile. . Eyes Nonicteric. Reactive to light. Ears, Nose, Mouth,  and Throat Lips, teeth, and gums WNL.Marland Kitchen Moist mucosa without lesions. Neck supple and nontender. No palpable supraclavicular or cervical adenopathy. Normal sized without goiter. Respiratory WNL. No retractions.. Cardiovascular Pedal Pulses WNL. No clubbing, cyanosis or edema. Lymphatic No adneopathy. No adenopathy. No  adenopathy. Musculoskeletal Adexa without tenderness or enlargement.. Digits and nails w/o clubbing, cyanosis, infection, petechiae, ischemia, or inflammatory conditions.. Integumentary (Hair, Skin) No suspicious lesions. No crepitus or fluctuance. No peri-wound warmth or erythema. No masses.Marland Kitchen Psychiatric Judgement and insight Intact.. No evidence of depression, anxiety, or agitation.. Notes the lymphedema is much better controlled and the wound is looking very much better with healthy granulation tissue and the surrounding fungal infection has also gone down significantly. Overall there has been excellent recovery. Electronic Signature(s) Signed: 06/16/2017 2:27:49 PM By: Christin Fudge MD, FACS Entered By: Christin Fudge on 06/16/2017 14:27:47 Brodrick, Marquetta F. ([id number]) -------------------------------------------------------------------------------- Physician Orders Details Patient Name: Aird, Merita F. Date of Service: 06/16/2017 1:30 PM Medical Record Number: [id number] Patient Account Number: [id number] Date of Birth/Sex: May 17, 1925 (83 y.o. Female) Treating RN: Cornell Barman Primary Care Provider: Halina Maidens Other Clinician: Referring Provider: Halina Maidens Treating Provider/Extender: Frann Rider in Treatment: 24 Verbal / Phone Orders: No Diagnosis Coding ICD-10 Coding Code Description E11.622 Type 2 diabetes mellitus with other skin ulcer I10 Essential (primary) hypertension I89.0 Lymphedema, not elsewhere classified Wound Cleansing Wound #1 Left,Lateral Lower Leg o Clean wound with wound cleanser. o Cleanse wound with mild soap and water o May Shower, gently pat wound dry prior to applying new dressing. o Other: - pt may take wrap off and shower before nurse comes or before she comes to clinic Anesthetic (add to Medication List) Wound #1 Left,Lateral Lower Leg o Topical Lidocaine 4% cream applied to wound bed prior to debridement (In Clinic Only). Skin  Barriers/Peri-Wound Care Wound #1 Left,Lateral Lower Leg o Antifungal cream - to red areas around wound (do not place on the ulcer itself) ****not a window****completely cover the reddened area around the wound (peri-wound area) Primary Wound Dressing Wound #1 Left,Lateral Lower Leg o Other: - cutimed sorbact Secondary Dressing Wound #1 Left,Lateral Lower Leg o ABD pad o Drawtex Dressing Change Frequency o Change Dressing Monday, Wednesday, Friday Follow-up Appointments o Return Appointment in 1 week. Edema Control Wound #1 Left,Lateral Lower Leg o 3 Layer Compression System - Left Lower Extremity - Please wrap 3cm from toes and 3cm from knee. unna to anchor o Elevate legs to the level of the heart and pump ankles as often as possible Shenk, Aailyah F. ([id number]) Additional Orders / Instructions Wound #1 Left,Lateral Lower Leg o Increase protein intake. Home Health Wound #1 [place] Nurse may visit PRN to address patientos wound care needs. o FACE TO FACE ENCOUNTER: MEDICARE and MEDICAID PATIENTS: I certify that this patient is under my care and that I had a face-to-face encounter that meets the physician face-to-face encounter requirements with this patient on this date. The encounter with the patient was in whole or in part for the following MEDICAL CONDITION: (primary reason for [place]) MEDICAL NECESSITY: I certify, that based on my findings, NURSING services are a medically necessary home health service. HOME BOUND STATUS: I certify that my clinical findings support that this patient is homebound (i.e., Due to illness or injury, pt requires aid of supportive devices such as crutches, cane, wheelchairs, walkers, the use of special transportation or the assistance of another person to leave their place  of residence. There is a normal inability to leave the home and doing so requires  considerable and taxing effort. Other absences are for medical reasons / religious services and are infrequent or of short duration when for other reasons). o If current dressing causes regression in wound condition, may D/C ordered dressing product/s and apply Normal Saline Moist Dressing daily until next [place] / Other MD appointment. [place] of regression in wound condition at [phone]. o Please direct any NON-WOUND related issues/requests for orders to patient's Primary Care Physician Medications-please add to medication list. Wound #1 Left,Lateral Lower Leg o Other: - Vitamin A, Vitamin C, Zinc, MVI Electronic Signature(s) Signed: 06/16/2017 4:17:30 PM By: Christin Fudge MD, FACS Signed: 06/16/2017 5:14:22 PM By: Gretta Cool, BSN, RN, CWS, Kim RN, BSN Entered By: Gretta Cool, BSN, RN, CWS, Kim on 06/16/2017 14:31:23 Barcenas, Penne F. ([id number]) -------------------------------------------------------------------------------- Problem List Details [name], [name] Date of Service: 06/16/2017 1:30 PM Medical Record Number: [id number] Patient Account Number: [id number] Date of Birth/Sex: 1924/10/07 (83 y.o. Female) Treating RN: Cornell Barman Primary Care Provider: Halina Maidens Other Clinician: Referring Provider: Halina Maidens Treating Provider/Extender: Frann Rider in Treatment: 24 Active Problems ICD-10 Encounter Code Description Active Date Diagnosis E11.622 Type 2 diabetes mellitus with other skin ulcer 01/02/2017 Yes I10 Essential (primary) hypertension 01/02/2017 Yes I89.0 Lymphedema, not elsewhere classified 01/16/2017 Yes Inactive Problems Resolved Problems ICD-10 Code Description Active Date Resolved Date L03.116 Cellulitis of left lower limb 01/02/2017 01/02/2017 Electronic Signature(s) Signed: 06/16/2017 2:26:22 PM By: Christin Fudge MD, FACS Entered By: Christin Fudge on 06/16/2017 14:26:22 Ames, Tomika F.  ([id number]) -------------------------------------------------------------------------------- Progress Note Details [name], [name] Date of Service: 06/16/2017 1:30 PM Medical Record Number: [id number] Patient Account Number: [id number] Date of Birth/Sex: Feb 04, 1925 (83 y.o. Female) Treating RN: Cornell Barman Primary Care Provider: Halina Maidens Other Clinician: Referring Provider: Halina Maidens Treating Provider/Extender: Frann Rider in Treatment: 24 Subjective Chief Complaint Information obtained from Patient Left lower leg cellulitis History of Present Illness (HPI) 12/30/16 Patient presents today for initial evaluation concerning an area over her left lateral ankle which she tells me has been intermittent in nature since 2000. However the current opening has been present for about two weeks. She has been tolerating the dressing changes at home with antibiotic ointment but that is really the only treatment that has been initiated at this point. That is other than the oral antibiotics which was Keflex that patient was placed on by her primary care provider prior to being referred to [place]. She does have some discomfort but rates this to be around a 2-3 out of 10 fortunately the redness does not seem to be spreading to any other location as far as her lower extremity is concerned. She does tell me that in the past antibiotics have been beneficial for her unfortunately the current antibiotics have not been of benefit and no wound culture was performed as of yet. She has previously seen Dr. 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 She does have diabetes and are most recent blood sugars have run between 104 and 106 according to patient's although her last hemoglobin A1c she is unaware of. Patient's  current wound does not appear to be significantly open but is rather more of a weeping region of cellulitis. 01/27/17 on evaluation  today patient left lower for many wound continues to show signs of erythema surrounding and in fact she is noted to have erythema from her toes up to just below the knee in regard to left lower extremity. She is not having any fevers, chills, nausea, vomiting, or diarrhea at this point. With that being said she is concerned about the silver alginate dressing. She tells me that in the past she used silver and some of the dressings for previous one and did not do well with it. Nonetheless she has discomfort along the wound itself but no other pain noted throughout the left lower extremity. 02/03/17 on evaluation today patient appears to be doing fairly well overall other than the fact that her wound is worsening on the lateral aspect of her left lower extremity. It appears more macerated and even slough covered at this point. With that being said she has been attempting to perform the dressing changes on her own although I'm not sure that she is understanding exactly how this should be done. Nonetheless I do believe the gentamicin may be causing too much maceration she really has not wanted to utilize the silver alginate dressings past due to a previous issue with this. Overall her erythema of the left lower extremity is improved she still has significant swelling. No fevers, chills, nausea, or vomiting noted at this time. 02/10/2017 -- Old notes received -- was seen in 2009 by [facility] by Dr. Romie Levee -- his impression was chronic patches of dermatitis on the left lower extremity and current mild swelling. He recommended venous insufficiency venous reflux studies. She had no evidence of arterial insufficiency. Follow-up chronic venous insufficiency study done on 12/16/2007, showed very mild amount of reflux on the great saphenous vein at the knee but the  function of the vein is normal and there was no evidence by the venous reflux study.. They referred her to dermatology for a second opinion and would see her back in about 4-6 weeks. She was seen back in follow-up in January 01 2008 and at that time she was asked to use a steroid ointment locally and the wounds are almost completely healed. The patient has told me today that she does not tolerate silver dressing -- and she has not had home health come and change her dressings over the last week. 02/17/2017 -- - had a lower arterial study done which showed a bilateral ABI suggests no significant lower extremity arterial disease and the toe brachial indicis were normal on the right and abnormal on the left. The right ABI was 1.0 and the left was 1.07. The right toe brachial indices was 0.81 on the right and 0.56 on the left. She had biphasic and triphasic flow at the tibial Hollenback, Azile F. ([id number]) vessels. 02/24/2017 -- the patient has been tolerating a 3 layer wrap and seems to be pleased with her progress 03/17/17 on evaluation today patient appears to be doing better in regard to her left lateral lower extremity wound. She has been Tolerating the wraps without complication. No fevers, chills, nausea, or vomiting noted at this time. She is pleased with how things are progressing. Early complaint is that she is unable to take a shower for such a long time from Wednesday all the way till the following Monday. 03/31/2017 - the patient and her nurse were concerned about the redness of the wound and the discharge from the wound but other than that everything else has been okay and she has been  doing fairly well. She has no constitutional symptoms 05/05/2017 -- she has made excellent progress and overall is very pleased. I believe if she continues at this rate we will be able to put her in her own compression stockings soon. 06/02/2017 -- she has started having pain again in her leg and it is swollen  quite a bit and she says there is more redness. She is concerned that the econazole has been causing her and allergy. 06/16/2017 -- she has taken doxycycline as planned and overall has done much better and there is less pain and less redness of her leg. Patient History Information obtained from Patient. Family History Cancer - Siblings,Child,Maternal Grandparents, Diabetes - Mother,Siblings, Heart Disease - Father, Hypertension - Siblings, Stroke - Father,Mother, Thyroid Problems - Child, No family history of Hereditary Spherocytosis, Kidney Disease, Lung Disease, Seizures, Tuberculosis. Social History Former smoker - smoked late teens early 23s, Marital Status - Widowed, Alcohol Use - Never, Drug Use - No History, Caffeine Use - Daily. Objective Constitutional Pulse regular. Respirations normal and unlabored. Afebrile. Vitals Time Taken: 1:50 PM, Height: 63 in, Weight: 164.8 lbs, BMI: 29.2, Temperature: 98.1 F, Pulse: 86 bpm, Respiratory Rate: 16 breaths/min, Blood Pressure: 137/91 mmHg. General Notes: Patient states she took her medications right before heading to her appointment. MD Notified. Patient will check pressure at home and will contact PCP if it remains high. Eyes Nonicteric. Reactive to light. Ears, Nose, Mouth, and Throat Lips, teeth, and gums WNL.Marland Kitchen Moist mucosa without lesions. Bernard, Ovida F. ([id number]) Neck supple and nontender. No palpable supraclavicular or cervical adenopathy. Normal sized without goiter. Respiratory WNL. No retractions.. Cardiovascular Pedal Pulses WNL. No clubbing, cyanosis or edema. Lymphatic No adneopathy. No adenopathy. No adenopathy. Musculoskeletal Adexa without tenderness or enlargement.. Digits and nails w/o clubbing, cyanosis, infection, petechiae, ischemia, or inflammatory conditions.Marland Kitchen Psychiatric Judgement and insight Intact.. No evidence of depression, anxiety, or agitation.. General Notes: the lymphedema is much better  controlled and the wound is looking very much better with healthy granulation tissue and the surrounding fungal infection has also gone down significantly. Overall there has been excellent recovery. Integumentary (Hair, Skin) No suspicious lesions. No crepitus or fluctuance. No peri-wound warmth or erythema. No masses.. Wound #1 status is Open. Original cause of wound was Gradually Appeared. The wound is located on the Left,Lateral Lower Leg. The wound measures 2.4cm length x 2.5cm width x 0.1cm depth; 4.712cm^2 area and 0.471cm^3 volume. There is Fat Layer (Subcutaneous Tissue) Exposed exposed. There is no tunneling or undermining noted. There is a large amount of serous drainage noted. The wound margin is flat and intact. There is small (1-33%) red granulation within the wound bed. There is a large (67-100%) amount of necrotic tissue within the wound bed including Adherent Slough. The periwound skin appearance exhibited: Maceration, Hemosiderin Staining, Erythema. The periwound skin appearance did not exhibit: Callus, Crepitus, Excoriation, Induration, Rash, Scarring, Dry/Scaly, Atrophie Blanche, Cyanosis, Ecchymosis, Mottled, Pallor, Rubor. The surrounding wound skin color is noted with erythema. Periwound temperature was noted as No Abnormality. The periwound has tenderness on palpation. Assessment Active Problems ICD-10 E11.622 - Type 2 diabetes mellitus with other skin ulcer I10 - Essential (primary) hypertension I89.0 - Lymphedema, not elsewhere classified Procedures Wound #1 Pre-procedure diagnosis of Wound #1 is a Diabetic Wound/Ulcer of the Lower Extremity located on the Left,Lateral Lower Standlee, Laiya F. ([id number]) Leg . There was a Three Layer Compression Therapy Procedure by Cornell Barman, RN. Post procedure Diagnosis Wound #1: Same as Pre-Procedure Plan Wound Cleansing:  Wound #1 Left,Lateral Lower Leg: Clean wound with wound cleanser. Cleanse wound with mild soap and water May  Shower, gently pat wound dry prior to applying new dressing. Other: - pt may take wrap off and shower before nurse comes or before she comes to clinic Anesthetic (add to Medication List): Wound #1 Left,Lateral Lower Leg: Topical Lidocaine 4% cream applied to wound bed prior to debridement (In Clinic Only). Skin Barriers/Peri-Wound Care: Wound #1 Left,Lateral Lower Leg: Antifungal cream - to red areas around wound (do not place on the ulcer itself) ****not a window****completely cover the reddened area around the wound (peri-wound area) Primary Wound Dressing: Wound #1 Left,Lateral Lower Leg: Other: - cutimed sorbact Secondary Dressing: Wound #1 Left,Lateral Lower Leg: ABD pad Drawtex Dressing Change Frequency: Change Dressing Monday, Wednesday, Friday Follow-up Appointments: Return Appointment in 1 week. Edema Control: Wound #1 Left,Lateral Lower Leg: 3 Layer Compression System - Left Lower Extremity - Please wrap 3cm from toes and 3cm from knee. unna to anchor Elevate legs to the level of the heart and pump ankles as often as possible Additional Orders / Instructions: Wound #1 Left,Lateral Lower Leg: Increase protein intake. Home Health: Wound #1 Left,Lateral Lower Leg: [place] Nurse may visit PRN to address patient s wound care needs. FACE TO FACE ENCOUNTER: MEDICARE and MEDICAID PATIENTS: I certify that this patient is under my care and that I had a face-to-face encounter that meets the physician face-to-face encounter requirements with this patient on this date. The encounter with the patient was in whole or in part for the following MEDICAL CONDITION: (primary reason for [place]) MEDICAL NECESSITY: I certify, that based on my findings, NURSING services are a medically necessary home health service. HOME BOUND STATUS: I certify that my clinical findings support that this patient is homebound (i.e., Due to illness or injury, pt requires  aid of supportive devices such as crutches, cane, wheelchairs, walkers, the use of special transportation or the assistance of another person to leave their place of residence. There is a normal inability to leave the home and doing so requires considerable and taxing effort. Other absences are for medical reasons / religious services and are infrequent or of short duration when for other reasons). If current dressing causes regression in wound condition, may D/C ordered dressing product/s and apply Normal Saline Moist Dressing daily until next [place] / Other MD appointment. [place] of regression in wound condition at [phone]. Please direct any NON-WOUND related issues/requests for orders to patient's Primary Care Physician Medications-please add to medication list.: [name], [name] ([id number]) Wound #1 Left,Lateral Lower Leg: Other: - Vitamin A, Vitamin C, Zinc, MVI he has done very well over the last couple of weeks and has made an excellent improvement. I have recommended: 1. Sorbact with foam and a 3 layer Profore wrap to be changed 3 times a week. 2. we will use a rim of Clobetesol around her main wound -- this is what her dermatologist has prescribed for her 3. Elevation and exercise has been discussed with her she had several questions which I have answered to her satisfaction. she will come back and see as every couple of weeks Electronic Signature(s) Signed: 06/16/2017 4:18:53 PM By: Christin Fudge MD, FACS Previous Signature: 06/16/2017 2:28:53 PM Version By: Christin Fudge MD, FACS Entered By: Christin Fudge on 06/16/2017 16:18:52 [name], [name] ([id number]) -------------------------------------------------------------------------------- ROS/PFSH Details [name], [name] Date of Service: 06/16/2017 1:30 PM Medical Record  Number: [id number] Patient Account Number: [id number] Date of Birth/Sex: 09/10/1924 (83 y.o. Female) Treating RN: Cornell Barman Primary Care Provider: Halina Maidens Other Clinician: Referring Provider: Halina Maidens Treating Provider/Extender: Frann Rider in Treatment: 24 Information Obtained From Patient Wound History Do you currently have one or more open woundso Yes How many open wounds do you currently haveo 1 Approximately how long have you had your woundso 2 weeks How have you been treating your wound(s) until nowo clobetasol Has your wound(s) ever healed and then re-openedo Yes Have you had any lab work done in the past montho No Have you tested positive for an antibiotic resistant organism (MRSA, VRE)o No Have you tested positive for osteomyelitis (bone infection)o Yes Have you had any tests for circulation on your legso Yes Where was the test doneo [facility] Have you had other problems associated with your woundso Swelling Endocrine Medical History: Positive for: Type II Diabetes Time with diabetes: 2003 Treated with: Oral agents Immunizations Pneumococcal Vaccine: Received Pneumococcal Vaccination: Yes Implantable Devices Family and Social History Cancer: Yes - Siblings,Child,Maternal Grandparents; Diabetes: Yes - Mother,Siblings; Heart Disease: Yes - Father; Hereditary Spherocytosis: No; Hypertension: Yes - Siblings; Kidney Disease: No; Lung Disease: No; Seizures: No; Stroke: Yes - Father,Mother; Thyroid Problems: Yes - Child; Tuberculosis: No; Former smoker - smoked late teens early 24s; Marital Status - Widowed; Alcohol Use: Never; Drug Use: No History; Caffeine Use: Daily; Financial Concerns: No; Food, Clothing or Shelter Needs: No; Support System Lacking: No; Transportation Concerns: No; Advanced Directives: No; Patient does not want information on Advanced Directives; Do not resuscitate: No; Living Will: Yes (Not Provided); Medical Power of Attorney: Yes (Not Provided) Physician Affirmation I have reviewed and agree with the above information. Electronic Signature(s) Signed:  06/16/2017 4:17:30 PM By: Christin Fudge MD, FACS Signed: 06/16/2017 5:14:22 PM By: Gretta Cool, BSN, RN, CWS, Kim RN, BSN Entered By: Christin Fudge on 06/16/2017 14:27:09 Scott, Dorotea F. ([id number]) -------------------------------------------------------------------------------- SuperBill Details Patient Name: Traynor, Nereida F. Date of Service: 06/16/2017 Medical Record Number: [id number] Patient Account Number: [id number] Date of Birth/Sex: 05/02/1925 (83 y.o. Female) Treating RN: Cornell Barman Primary Care Provider: Halina Maidens Other Clinician: Referring Provider: Halina Maidens Treating Provider/Extender: Frann Rider in Treatment: 24 Diagnosis Coding ICD-10 Codes Code Description E11.622 Type 2 diabetes mellitus with other skin ulcer I10 Essential (primary) hypertension I89.0 Lymphedema, not elsewhere classified Facility Procedures CPT4 Code: 71696789 Description: (Facility Use Only) [phone] - [place] LWR LT LEG Modifier: Quantity: 1 Physician Procedures CPT4 Code: 1025852 Description: 77824 - WC PHYS LEVEL 3 - EST PT ICD-10 Diagnosis Description E11.622 Type 2 diabetes mellitus with other skin ulcer I10 Essential (primary) hypertension I89.0 Lymphedema, not elsewhere classified Modifier: Quantity: 1 Electronic Signature(s) Signed: 06/16/2017 4:17:30 PM By: Christin Fudge MD, FACS Signed: 06/16/2017 5:14:22 PM By: Gretta Cool, BSN, RN, CWS, Kim RN, BSN Previous Signature: 06/16/2017 2:32:19 PM Version By: Christin Fudge MD, FACS Entered By: Gretta Cool, BSN, RN, CWS, Kim on 06/16/2017 14:34:02

## 2017-06-27 DIAGNOSIS — I5032 Chronic diastolic (congestive) heart failure: Secondary | ICD-10-CM | POA: Insufficient documentation

## 2017-06-29 ENCOUNTER — Encounter: Payer: Medicare PPO | Attending: Physician Assistant | Admitting: Surgery

## 2017-06-29 DIAGNOSIS — I890 Lymphedema, not elsewhere classified: Secondary | ICD-10-CM | POA: Insufficient documentation

## 2017-06-29 DIAGNOSIS — E11622 Type 2 diabetes mellitus with other skin ulcer: Secondary | ICD-10-CM | POA: Diagnosis not present

## 2017-06-29 DIAGNOSIS — Z87891 Personal history of nicotine dependence: Secondary | ICD-10-CM | POA: Diagnosis not present

## 2017-06-29 DIAGNOSIS — I10 Essential (primary) hypertension: Secondary | ICD-10-CM | POA: Diagnosis not present

## 2017-06-29 DIAGNOSIS — Z880 Allergy status to penicillin: Secondary | ICD-10-CM | POA: Diagnosis not present

## 2017-06-29 DIAGNOSIS — Z881 Allergy status to other antibiotic agents status: Secondary | ICD-10-CM | POA: Diagnosis not present

## 2017-06-29 DIAGNOSIS — L97222 Non-pressure chronic ulcer of left calf with fat layer exposed: Secondary | ICD-10-CM | POA: Insufficient documentation

## 2017-06-29 DIAGNOSIS — Z7984 Long term (current) use of oral hypoglycemic drugs: Secondary | ICD-10-CM | POA: Diagnosis not present

## 2017-07-01 NOTE — Progress Notes (Signed)
Laura [name], [name] ([id number]) Visit Report for 06/29/2017 Allergy List Details Patient Name: Laura [name], [name] Date of Service: 06/29/2017 12:30 PM Medical Record Number: [id number] Patient Account Number: [id number] Date of Birth/Sex: 11/12/1924 (84 y.o. Female) Treating RN: Ahmed Prima Primary Care Herbie Lehrmann: Halina Maidens Other Clinician: Referring Dreyton Roessner: Halina Maidens Treating Jassica Zazueta/Extender: Joaquim Lai III, HOYT Weeks in Treatment: 25 Allergies Active Allergies Augmentin calcium channel blockers nitrofurantoin ACE Inhibitors atorvastatin Beta-Blockers (Beta-Adrenergic Blocking Agts) Levaquin pravastatin Statins-Hmg-Coa Reductase Inhibitors sulfa clindamycin lincomycin SILVER doxycycline Allergy Notes Electronic Signature(s) Signed: 06/29/2017 4:57:34 PM By: Alric Quan Entered By: Alric Quan on 06/29/2017 13:16:36 Laura [name], [name] ([id number]) -------------------------------------------------------------------------------- Arrival Information Details Patient Name: Laura [name], [name] Date of Service: 06/29/2017 12:30 PM Medical Record Number: [id number] Patient Account Number: [id number] Date of Birth/Sex: 10/14/1924 (84 y.o. Female) Treating RN: Carolyne Fiscal, Debi Primary Care Aubrina Nieman: Halina Maidens Other Clinician: Referring Khalidah Herbold: Halina Maidens Treating Jessicaann Overbaugh/Extender: Melburn Hake, HOYT Weeks in Treatment: 25 Visit Information Patient Arrived: Lyndel [place] Time: 12:47 Accompanied By: friend Transfer Assistance: EasyPivot Patient Lift Patient Identification Verified: Yes Secondary Verification Process Yes Completed: Patient Requires Transmission-Based No Precautions: Patient Has Alerts: Yes Patient Alerts: DM II History Since Last Visit All ordered tests and consults were completed: No Added or deleted any medications: No Any new allergies or adverse reactions: No Had a fall or experienced change in activities of daily living that may affect risk of  falls: No Signs or symptoms of abuse/neglect since last visito No Hospitalized since last visit: No Has Dressing in Place as Prescribed: Yes Electronic Signature(s) Signed: 06/29/2017 4:57:34 PM By: Alric Quan Entered By: Alric Quan on 06/29/2017 12:49:12 Laura [name], [name] ([id number]) -------------------------------------------------------------------------------- Encounter Discharge Information Details Patient Name: Laura [name], [name] Date of Service: 06/29/2017 12:30 PM Medical Record Number: [id number] Patient Account Number: [id number] Date of Birth/Sex: 1925-04-11 (84 y.o. Female) Treating RN: Carolyne Fiscal, Debi Primary Care Oshen Wlodarczyk: Halina Maidens Other Clinician: Referring Melat Wrisley: Halina Maidens Treating Kanae Ignatowski/Extender: Melburn Hake, HOYT Weeks in Treatment: 25 Encounter Discharge Information Items Discharge Pain Level: 0 Discharge Condition: Stable Ambulatory Status: Cane Discharge Destination: Home Transportation: Private Auto Accompanied By: friend Schedule Follow-up Appointment: Yes Medication Reconciliation completed and No provided to Patient/Care Yaritzy Huser: Provided on Clinical Summary of Care: 06/29/2017 Form Type Recipient Paper Patient [facility] Electronic Signature(s) Signed: 06/30/2017 10:03:49 AM By: Ruthine Dose Entered By: Ruthine Dose on 06/29/2017 13:55:16 Laura [name], [name] ([id number]) -------------------------------------------------------------------------------- Lower Extremity Assessment Details Patient Name: Laura [name], [name] Date of Service: 06/29/2017 12:30 PM Medical Record Number: [id number] Patient Account Number: [id number] Date of Birth/Sex: 10-04-1924 (84 y.o. Female) Treating RN: Carolyne Fiscal, Debi Primary Care Shakoya Gilmore: Halina Maidens Other Clinician: Referring Ester Hilley: Halina Maidens Treating Irby Fails/Extender: Melburn Hake, HOYT Weeks in Treatment: 25 Edema Assessment Assessed: [Left: No] [Right: No] [Left: Edema] [Right: :] Calf Left:  Right: Point of Measurement: 34 cm From Medial Instep 33.6 cm cm Ankle Left: Right: Point of Measurement: 10 cm From Medial Instep 22.6 cm cm Vascular Assessment Pulses: Dorsalis Pedis Palpable: [Left:Yes] Posterior Tibial Extremity colors, hair growth, and conditions: Extremity Color: [Left:Red] Temperature of Extremity: [Left:Warm] Capillary Refill: [Left:< 3 seconds] Electronic Signature(s) Signed: 06/29/2017 4:57:34 PM By: Alric Quan Entered By: Alric Quan on 06/29/2017 12:57:30 Laura [name], [name] ([id number]) -------------------------------------------------------------------------------- Multi Wound Chart Details Patient Name: Laura [name], [name] Date of Service: 06/29/2017 12:30 PM Medical Record Number: [id number] Patient Account Number: [id number] Date of Birth/Sex: Jul 23, 1924 (84 y.o. Female) Treating RN: Carolyne Fiscal, Debi Primary Care Cherene Dobbins: Halina Maidens Other Clinician: Referring Violette Morneault: Halina Maidens Treating Garrette Caine/Extender: Joaquim Lai  III, HOYT Weeks in Treatment: 25 Vital Signs Height(in): 63 Pulse(bpm): 72 Weight(lbs): 164.8 Blood Pressure(mmHg): 160/84 Body Mass Index(BMI): 29 Temperature(F): 97.9 Respiratory Rate 16 (breaths/min): Photos: [1:No Photos] [N/A:N/A] Wound Location: [1:Left Lower Leg - Lateral] [N/A:N/A] Wounding Event: [1:Gradually Appeared] [N/A:N/A] Primary Etiology: [1:Diabetic Wound/Ulcer of the Lower Extremity] [N/A:N/A] Comorbid History: [1:Type II Diabetes] [N/A:N/A] Date Acquired: [1:12/16/2016] [N/A:N/A] Weeks of Treatment: [1:25] [N/A:N/A] Wound Status: [1:Open] [N/A:N/A] Measurements L x W x D [1:1.6x2.5x0.1] [N/A:N/A] (cm) Area (cm) : [1:3.142] [N/A:N/A] Volume (cm) : [1:0.314] [N/A:N/A] % Reduction in Area: [1:-220.00%] [N/A:N/A] % Reduction in Volume: [1:-220.40%] [N/A:N/A] Classification: [1:Grade 1] [N/A:N/A] Exudate Amount: [1:Large] [N/A:N/A] Exudate Type: [1:Serous] [N/A:N/A] Exudate Color: [1:amber]  [N/A:N/A] Wound Margin: [1:Flat and Intact] [N/A:N/A] Granulation Amount: [1:Small (1-33%)] [N/A:N/A] Granulation Quality: [1:Red] [N/A:N/A] Necrotic Amount: [1:Large (67-100%)] [N/A:N/A] Exposed Structures: [1:Fat Layer (Subcutaneous Tissue) Exposed: Yes Fascia: No Tendon: No Muscle: No Joint: No Bone: No] [N/A:N/A] Epithelialization: [1:Small (1-33%)] [N/A:N/A] Periwound Skin Texture: [1:Excoriation: No Induration: No Callus: No Crepitus: No Rash: No Scarring: No] [N/A:N/A] Periwound Skin Moisture: Maceration: Yes N/A N/A Dry/Scaly: No Periwound Skin Color: Erythema: Yes N/A N/A Hemosiderin Staining: Yes Atrophie Blanche: No Cyanosis: No Ecchymosis: No Mottled: No Pallor: No Rubor: No Erythema Location: Circumferential N/A N/A Temperature: No Abnormality N/A N/A Tenderness on Palpation: Yes N/A N/A Wound Preparation: Ulcer Cleansing: N/A N/A Rinsed/Irrigated with Saline, Other: soap and water Topical Anesthetic Applied: Other: lidocaine 4% Treatment Notes Electronic Signature(s) Signed: 06/29/2017 4:57:34 PM By: Alric Quan Entered By: Alric Quan on 06/29/2017 13:10:52 Laura [name], [name] ([id number]) -------------------------------------------------------------------------------- [place] Details Patient Name: Laura [name], [name] Date of Service: 06/29/2017 12:30 PM Medical Record Number: [id number] Patient Account Number: [id number] Date of Birth/Sex: 03-Mar-1925 (84 y.o. Female) Treating RN: Carolyne Fiscal, Debi Primary Care Lahari Suttles: Halina Maidens Other Clinician: Referring Jolana Runkles: Halina Maidens Treating Gracie Gupta/Extender: Melburn Hake, HOYT Weeks in Treatment: 25 Active Inactive ` Abuse / Safety / Falls / Self Care Management Nursing Diagnoses: Potential for falls Goals: Patient will not experience any injury related to falls Date Initiated: 12/30/2016 Target Resolution Date: 04/29/2017 Goal Status: Active Interventions: Assess Activities of  Daily Living upon admission and as needed Assess: immobility, friction, shearing, incontinence upon admission and as needed Notes: ` Nutrition Nursing Diagnoses: Imbalanced nutrition Impaired glucose control: actual or potential Potential for alteratiion in Nutrition/Potential for imbalanced nutrition Goals: Patient/caregiver will maintain therapeutic glucose control Date Initiated: 12/30/2016 Target Resolution Date: 04/29/2017 Goal Status: Active Interventions: Assess patient nutrition upon admission and as needed per policy Notes: ` Orientation to the Wound Care Program Nursing Diagnoses: Knowledge deficit related to the wound healing center program Goals: Patient/caregiver will verbalize understanding of the [place] Date Initiated: 12/30/2016 Target Resolution Date: 01/21/2017 Marcantel, Lindsay Wanda Plump ([id number]) Goal Status: Active Interventions: Provide education on orientation to the wound center Notes: ` Pain, Acute or Chronic Nursing Diagnoses: Pain, acute or chronic: actual or potential Potential alteration in comfort, pain Goals: Patient/caregiver will verbalize adequate pain control between visits Date Initiated: 12/30/2016 Target Resolution Date: 04/29/2017 Goal Status: Active Interventions: Complete pain assessment as per visit requirements Notes: ` Wound/Skin Impairment Nursing Diagnoses: Impaired tissue integrity Knowledge deficit related to smoking impact on wound healing Knowledge deficit related to ulceration/compromised skin integrity Goals: Ulcer/skin breakdown will have a volume reduction of 80% by week 12 Date Initiated: 12/30/2016 Target Resolution Date: 04/22/2017 Goal Status: Active Interventions: Assess patient/caregiver ability to perform ulcer/skin care regimen upon admission and as needed Notes: Electronic Signature(s) Signed: 06/29/2017 4:57:34 PM  By: Alric Quan Entered By: Alric Quan on 06/29/2017 13:10:44 Laura [name],  [name] ([id number]) -------------------------------------------------------------------------------- Pain Assessment Details Patient Name: Laura [name], Kaida F. Date of Service: 06/29/2017 12:30 PM Medical Record Number: [id number] Patient Account Number: [id number] Date of Birth/Sex: 19-Jul-1924 (84 y.o. Female) Treating RN: Carolyne Fiscal, Debi Primary Care Jiles Goya: Halina Maidens Other Clinician: Referring Wofford Stratton: Halina Maidens Treating Cougar Imel/Extender: Melburn Hake, HOYT Weeks in Treatment: 25 Active Problems Location of Pain Severity and Description of Pain Patient Has Paino No Site Locations Pain Management and Medication Current Pain Management: Electronic Signature(s) Signed: 06/29/2017 4:57:34 PM By: Alric Quan Entered By: Alric Quan on 06/29/2017 12:49:17 Impastato, Telissa F. ([id number]) -------------------------------------------------------------------------------- Patient/Caregiver Education Details Patient Name: Laura [name], Hilda F. Date of Service: 06/29/2017 12:30 PM Medical Record Number: [id number] Patient Account Number: [id number] Date of Birth/Gender: 11/05/1924 (84 y.o. Female) Treating RN: Ahmed Prima Primary Care Physician: Halina Maidens Other Clinician: Referring Physician: Halina Maidens Treating Physician/Extender: Melburn Hake, HOYT Weeks in Treatment: 25 Education Assessment Education Provided To: Patient Education Topics Provided Wound/Skin Impairment: Handouts: Caring for Your Ulcer, Other: change dressing as ordered Methods: Demonstration, Explain/Verbal Responses: State content correctly Electronic Signature(s) Signed: 06/29/2017 4:57:34 PM By: Alric Quan Entered By: Alric Quan on 06/29/2017 13:11:27 Lesueur, Genelle F. ([id number]) -------------------------------------------------------------------------------- Wound Assessment Details Patient Name: Faddis, Janie F. Date of Service: 06/29/2017 12:30 PM Medical Record Number: [id number] Patient Account  Number: [id number] Date of Birth/Sex: 03-05-1925 (84 y.o. Female) Treating RN: Carolyne Fiscal, Debi Primary Care Alnisa Hasley: Halina Maidens Other Clinician: Referring Aberdeen Hafen: Halina Maidens Treating Johny Pitstick/Extender: Melburn Hake, HOYT Weeks in Treatment: 25 Wound Status Wound Number: 1 Primary Etiology: Diabetic Wound/Ulcer of the Lower Extremity Wound Location: Left Lower Leg - Lateral Wound Status: Open Wounding Event: Gradually Appeared Comorbid Type II Diabetes Date Acquired: 12/16/2016 History: Weeks Of Treatment: 25 Clustered Wound: No Photos Photo Uploaded By: Alric Quan on 06/29/2017 16:51:48 Wound Measurements Length: (cm) 1.6 Width: (cm) 2.5 Depth: (cm) 0.1 Area: (cm) 3.142 Volume: (cm) 0.314 % Reduction in Area: -220% % Reduction in Volume: -220.4% Epithelialization: Small (1-33%) Tunneling: No Undermining: No Wound Description Classification: Grade 1 Wound Margin: Flat and Intact Exudate Amount: Large Exudate Type: Serous Exudate Color: amber Foul Odor After Cleansing: No Slough/Fibrino Yes Wound Bed Granulation Amount: Small (1-33%) Exposed Structure Granulation Quality: Red Fascia Exposed: No Necrotic Amount: Large (67-100%) Fat Layer (Subcutaneous Tissue) Exposed: Yes Necrotic Quality: Adherent Slough Tendon Exposed: No Muscle Exposed: No Joint Exposed: No Bone Exposed: No Periwound Skin Texture Bir, Joud F. ([id number]) Texture Color No Abnormalities Noted: No No Abnormalities Noted: No Callus: No Atrophie Blanche: No Crepitus: No Cyanosis: No Excoriation: No Ecchymosis: No Induration: No Erythema: Yes Rash: No Erythema Location: Circumferential Scarring: No Hemosiderin Staining: Yes Mottled: No Moisture Pallor: No No Abnormalities Noted: No Rubor: No Dry / Scaly: No Maceration: Yes Temperature / Pain Temperature: No Abnormality Tenderness on Palpation: Yes Wound Preparation Ulcer Cleansing: Rinsed/Irrigated with Saline,  Other: soap and water, Topical Anesthetic Applied: Other: lidocaine 4%, Treatment Notes Wound #1 (Left, Lateral Lower Leg) 1. Cleansed with: Clean wound with Normal Saline 2. Anesthetic Topical Lidocaine 4% cream to wound bed prior to debridement 3. Peri-wound Care: Antifungal cream 4. Dressing Applied: Promogran 5. Secondary Dressing Applied ABD Pad Dry Gauze 7. Secured with Tape 3 Layer Compression System - Left Lower Extremity Notes unna to anchor, promogran Electronic Signature(s) Signed: 06/29/2017 4:57:34 PM By: Alric Quan Entered By: Alric Quan on 06/29/2017 12:55:21 Lizama, Anureet F. ([id number]) -------------------------------------------------------------------------------- Vitals Details Patient Name: Teutsch, Yanil F. Date  of Service: 06/29/2017 12:30 PM Medical Record Number: [id number] Patient Account Number: [id number] Date of Birth/Sex: Jul 22, 1924 (84 y.o. Female) Treating RN: Carolyne Fiscal, Debi Primary Care Caitlyne Ingham: Halina Maidens Other Clinician: Referring Samanvi Cuccia: Halina Maidens Treating Harmony Sandell/Extender: Melburn Hake, HOYT Weeks in Treatment: 25 Vital Signs Time Taken: 12:50 Temperature (F): 97.9 Height (in): 63 Pulse (bpm): 72 Weight (lbs): 164.8 Respiratory Rate (breaths/min): 16 Body Mass Index (BMI): 29.2 Blood Pressure (mmHg): 160/84 Reference Range: 80 - 120 mg / dl Electronic Signature(s) Signed: 06/29/2017 4:57:34 PM By: Alric Quan Entered By: Alric Quan on 06/29/2017 12:50:23

## 2017-07-02 NOTE — Progress Notes (Addendum)
[facility], Maghen F. ([id number]) Visit Report for 06/29/2017 Chief Complaint Document Details Patient Name: Laura [name], [name] Date of Service: 06/29/2017 12:30 PM Medical Record Number: [id number] Patient Account Number: [id number] Date of Birth/Sex: December 08, 1924 (84 y.o. Female) Treating RN: Ahmed Prima Primary Care Provider: Halina Maidens Other Clinician: Referring Provider: Halina Maidens Treating Provider/Extender: Melburn Hake, HOYT Weeks in Treatment: 25 Information Obtained from: Patient Chief Complaint Left lower leg cellulitis Electronic Signature(s) Signed: 06/30/2017 8:05:24 AM By: Worthy Keeler PA-C Entered By: Worthy Keeler on 06/29/2017 13:00:17 Longbottom, Shanterria F. ([id number]) -------------------------------------------------------------------------------- Debridement Details Patient Name: Laura [name], Brittan F. Date of Service: 06/29/2017 12:30 PM Medical Record Number: [id number] Patient Account Number: [id number] Date of Birth/Sex: 1925/03/04 (84 y.o. Female) Treating RN: Carolyne Fiscal, Debi Primary Care Provider: Halina Maidens Other Clinician: Referring Provider: Halina Maidens Treating Provider/Extender: Melburn Hake, HOYT Weeks in Treatment: 25 Debridement Performed for Wound #1 Left,Lateral Lower Leg Assessment: Performed By: Physician STONE III, HOYT E., PA-C Debridement: Debridement Severity of Tissue Pre Fat layer exposed Debridement: Pre-procedure Verification/Time Yes - 13:22 Out Taken: Start Time: 13:23 Pain Control: Lidocaine 4% Topical Solution Level: Skin/Subcutaneous Tissue Total Area Debrided (L x W): 1.6 (cm) x 2.5 (cm) = 4 (cm) Tissue and other material Viable, Exudate, Fibrin/Slough, Subcutaneous debrided: Instrument: Curette Bleeding: Minimum Hemostasis Achieved: Pressure End Time: 13:26 Procedural Pain: 0 Post Procedural Pain: 0 Response to Treatment: Procedure was tolerated well Post Debridement Measurements of Total Wound Length: (cm) 1.6 Width: (cm)  2.5 Depth: (cm) 0.2 Volume: (cm) 0.628 Character of Wound/Ulcer Post Debridement: Requires Further Debridement Severity of Tissue Post Debridement: Fat layer exposed Post Procedure Diagnosis Same as Pre-procedure Electronic Signature(s) Signed: 06/29/2017 4:57:34 PM By: Alric Quan Signed: 06/30/2017 8:05:24 AM By: Worthy Keeler PA-C Entered By: Alric Quan on 06/29/2017 13:26:06 Curtice, Milinda F. ([id number]) -------------------------------------------------------------------------------- HPI Details Patient Name: Laura [name], [name] Date of Service: 06/29/2017 12:30 PM Medical Record Number: [id number] Patient Account Number: [id number] Date of Birth/Sex: 1924-07-10 (84 y.o. Female) Treating RN: Carolyne Fiscal, Debi Primary Care Provider: Halina Maidens Other Clinician: Referring Provider: Halina Maidens Treating Provider/Extender: Melburn Hake, HOYT Weeks in Treatment: 25 History of Present Illness HPI Description: 12/30/16 Patient presents today for initial evaluation concerning an area over her left lateral ankle which she tells me has been intermittent in nature since 2000. However the current opening has been present for about two weeks. She has been tolerating the dressing changes at home with antibiotic ointment but that is really the only treatment that has been initiated at this point. That is other than the oral antibiotics which was Keflex that patient was placed on by her primary care provider prior to being referred to [place]. She does have some discomfort but rates this to be around a 2-3 out of 10 fortunately the redness does not seem to be spreading to any other location as far as her lower extremity is concerned. She does tell me that in the past antibiotics have been beneficial for her unfortunately the current antibiotics have not been of benefit and no wound culture was performed as of yet. She has previously seen Dr. 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 She does have diabetes and are most recent blood sugars have run between 104 and 106 according to patient's although her last hemoglobin A1c she is unaware of. Patient's  current wound does not appear to be significantly open but is rather more of a weeping region of cellulitis. 01/27/17 on evaluation today patient left lower for many wound continues to show signs of erythema surrounding and in fact she is noted to have erythema from her toes up to just below the knee in regard to left lower extremity. She is not having any fevers, chills, nausea, vomiting, or diarrhea at this point. With that being said she is concerned about the silver alginate dressing. She tells me that in the past she used silver and some of the dressings for previous one and did not do well with it. Nonetheless she has discomfort along the wound itself but no other pain noted throughout the left lower extremity. 02/03/17 on evaluation today patient appears to be doing fairly well overall other than the fact that her wound is worsening on the lateral aspect of her left lower extremity. It appears more macerated and even slough covered at this point. With that being said she has been attempting to perform the dressing changes on her own although I'm not sure that she is understanding exactly how this should be done. Nonetheless I do believe the gentamicin may be causing too much maceration she really has not wanted to utilize the silver alginate dressings past due to a previous issue with this. Overall her erythema of the left lower extremity is improved she still has significant swelling. No fevers, chills, nausea, or vomiting noted at this time. 02/10/2017 -- Old notes received -- was seen in 2009 by [facility] by Dr. Romie Levee -- his impression was  chronic patches of dermatitis on the left lower extremity and current mild swelling. He recommended venous insufficiency venous reflux studies. She had no evidence of arterial insufficiency. Follow-up chronic venous insufficiency study done on 12/16/2007, showed very mild amount of reflux on the great saphenous vein at the knee but the function of the vein is normal and there was no evidence by the venous reflux study.. They referred her to dermatology for a second opinion and would see her back in about 4-6 weeks. She was seen back in follow-up in January 01 2008 and at that time she was asked to use a steroid ointment locally and the wounds are almost completely healed. The patient has told me today that she does not tolerate silver dressing -- and she has not had home health come and change her dressings over the last week. 02/17/2017 -- - had a lower arterial study done which showed a bilateral ABI suggests no significant lower extremity arterial disease and the toe brachial indicis were normal on the right and abnormal on the left. The right ABI was 1.0 and the left was 1.07. The right toe brachial indices was 0.81 on the right and 0.56 on the left. She had biphasic and triphasic flow at the tibial vessels. 02/24/2017 -- the patient has been tolerating a 3 layer wrap and seems to be pleased with her progress 03/17/17 on evaluation today patient appears to be doing better in regard to her left lateral lower extremity wound. She has been Tolerating the wraps without complication. No fevers, chills, nausea, or vomiting noted at this time. She is pleased with [place], Elim F. ([id number]) how things are progressing. Early complaint is that she is unable to take a shower for such a long time from Wednesday all the way till the following Monday. 06/29/17 on evaluation today patient's left lateral lower extremity wound appears to  show signs of improvement compared to when I last saw her. With that being said  it has been sometime since I have seen her. She did have a lot of questions today concerning the wound but overall I am somewhat pleased with how this appears. Electronic Signature(s) Signed: 06/30/2017 8:05:24 AM By: Worthy Keeler PA-C Entered By: Worthy Keeler on 06/29/2017 17:36:45 Sanjose, Roderick F. ([id number]) -------------------------------------------------------------------------------- Physical Exam Details Patient Name: Grammatico, Jaleya F. Date of Service: 06/29/2017 12:30 PM Medical Record Number: [id number] Patient Account Number: [id number] Date of Birth/Sex: 1924-09-04 (84 y.o. Female) Treating RN: Ahmed Prima Primary Care Provider: Halina Maidens Other Clinician: Referring Provider: Halina Maidens Treating Provider/Extender: STONE III, HOYT Weeks in Treatment: 43 Constitutional Well-nourished and well-hydrated in no acute distress. Respiratory normal breathing without difficulty. Psychiatric this patient is able to make decisions and demonstrates good insight into disease process. Alert and Oriented x 3. pleasant and cooperative. Notes Patient did have slough covering the wound bed noted on evaluation today. After discussing this with patient this was sharply debride it which he tolerated without complication and the wound bed appeared to be greatly improved. Electronic Signature(s) Signed: 06/30/2017 8:05:24 AM By: Worthy Keeler PA-C Entered By: Worthy Keeler on 06/29/2017 17:37:34 Higginbotham, Charlotte F. ([id number]) -------------------------------------------------------------------------------- Physician Orders Details Patient Name: Vanderheyden, Murel F. Date of Service: 06/29/2017 12:30 PM Medical Record Number: [id number] Patient Account Number: [id number] Date of Birth/Sex: 15-Feb-1925 (84 y.o. Female) Treating RN: Carolyne Fiscal, Debi Primary Care Provider: Halina Maidens Other Clinician: Referring Provider: Halina Maidens Treating Provider/Extender: Melburn Hake, HOYT Weeks in  Treatment: 12 Verbal / Phone Orders: Yes Clinician: Carolyne Fiscal, Debi Read Back and Verified: Yes Diagnosis Coding ICD-10 Coding Code Description E11.622 Type 2 diabetes mellitus with other skin ulcer I10 Essential (primary) hypertension I89.0 Lymphedema, not elsewhere classified Wound Cleansing Wound #1 Left,Lateral Lower Leg o Clean wound with wound cleanser. o Cleanse wound with mild soap and water o May Shower, gently pat wound dry prior to applying new dressing. o Other: - pt may take wrap off and shower before nurse comes or before she comes to clinic Anesthetic (add to Medication List) Wound #1 Left,Lateral Lower Leg o Topical Lidocaine 4% cream applied to wound bed prior to debridement (In Clinic Only). Skin Barriers/Peri-Wound Care Wound #1 Left,Lateral Lower Leg o Antifungal cream - to red areas around wound (do not place on the ulcer itself) ****not a window****completely cover the reddened area around the wound (peri-wound area) Primary Wound Dressing Wound #1 Left,Lateral Lower Leg o Promogran - lightly moisten with normal saline Secondary Dressing Wound #1 Left,Lateral Lower Leg o ABD pad o Dry Gauze o XtraSorb Dressing Change Frequency o Change Dressing Monday, Wednesday, Friday Follow-up Appointments o Return Appointment in 1 week. Edema Control Wound #1 Left,Lateral Lower Leg o 3 Layer Compression System - Left Lower Extremity - Please wrap 3cm from toes and 3cm from knee. unna to anchor *****DO NOT USE THE COTTON LAYER, PLEASE USE KERLIX INSTEAD***** Centola, Lashica F. ([id number]) o Elevate legs to the level of the heart and pump ankles as often as possible Additional Orders / Instructions Wound #1 Left,Lateral Lower Leg o Increase protein intake. Home Health Wound #1 [place] Nurse may visit PRN to address patientos wound care needs. o FACE TO FACE ENCOUNTER:  MEDICARE and MEDICAID PATIENTS: I certify that this patient is under my care and that I had a face-to-face encounter that meets the physician face-to-face  encounter requirements with this patient on this date. The encounter with the patient was in whole or in part for the following MEDICAL CONDITION: (primary reason for [place]) MEDICAL NECESSITY: I certify, that based on my findings, NURSING services are a medically necessary home health service. HOME BOUND STATUS: I certify that my clinical findings support that this patient is homebound (i.e., Due to illness or injury, pt requires aid of supportive devices such as crutches, cane, wheelchairs, walkers, the use of special transportation or the assistance of another person to leave their place of residence. There is a normal inability to leave the home and doing so requires considerable and taxing effort. Other absences are for medical reasons / religious services and are infrequent or of short duration when for other reasons). o If current dressing causes regression in wound condition, may D/C ordered dressing product/s and apply Normal Saline Moist Dressing daily until next [place] / Other MD appointment. [place] of regression in wound condition at [phone]. o Please direct any NON-WOUND related issues/requests for orders to patient's Primary Care Physician Medications-please add to medication list. Wound #1 Left,Lateral Lower Leg o Other: - Vitamin A, Vitamin C, Zinc, MVI Patient Medications Allergies: Augmentin, calcium channel blockers, nitrofurantoin, ACE Inhibitors, atorvastatin, Beta-Blockers (Beta-Adrenergic Blocking Agts), Levaquin, pravastatin, Statins-Hmg-Coa Reductase Inhibitors, sulfa, clindamycin, lincomycin, SILVER, doxycycline Notifications Medication Indication Start End lidocaine DOSE 1 - topical 4 % cream - 1 cream topical Electronic Signature(s) Signed: 06/29/2017 4:57:34  PM By: Alric Quan Signed: 06/30/2017 8:05:24 AM By: Worthy Keeler PA-C Entered By: Alric Quan on 06/29/2017 14:45:49 Santalucia, Jasey F. ([id number]) -------------------------------------------------------------------------------- Prescription 06/29/2017 Patient Name: Laura [name], Pailyn F. Provider: Worthy Keeler PA-C Date of Birth: [birth year] NPI#: [id number] Sex: F DEA#: [id number] Phone #: [phone] License #: Patient Address: [facility] [place], [place] [address] [address] [place], [place] [address] [phone] Allergies Augmentin calcium channel blockers nitrofurantoin ACE Inhibitors atorvastatin Beta-Blockers (Beta-Adrenergic Blocking Agts) Levaquin pravastatin Statins-Hmg-Coa Reductase Inhibitors sulfa clindamycin lincomycin SILVER doxycycline Medication Buzby, Samanta F. ([id number]) Medication: Route: Strength: Form: lidocaine 4 % topical cream topical 4% cream Class: TOPICAL LOCAL ANESTHETICS Dose: Frequency / Time: Indication: 1 1 cream topical Number of Refills: Number of Units: 0 Generic Substitution: Start Date: End Date: One Time Use: Substitution Permitted No Note to Pharmacy: Signature(s): Date(s): Electronic Signature(s) Signed: 06/29/2017 4:57:34 PM By: Alric Quan Signed: 06/30/2017 8:05:24 AM By: Worthy Keeler PA-C Entered By: Alric Quan on 06/29/2017 14:45:49 Jenniges, Chesney F. ([id number]) --------------------------------------------------------------------------------  Problem List Details Patient Name: [place], Aristea F. Date of Service: 06/29/2017 12:30 PM Medical Record Number: [id number] Patient Account Number: [id number] Date of Birth/Sex: 18-Nov-1924 (84 y.o. 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 ([id number]) -------------------------------------------------------------------------------- Progress Note Details Patient Name: Sunde, Deysi F. Date  of Service: 06/29/2017 12:30 PM Medical Record Number: [id number] Patient Account Number: [id number] Date of Birth/Sex: 23-Jul-1924 (84 y.o. Female) Treating RN: Carolyne Fiscal, Debi Primary Care Provider: Halina Maidens Other Clinician: Referring Provider: Halina Maidens Treating Provider/Extender: Melburn Hake, HOYT Weeks in Treatment: 25 Subjective Chief Complaint Information obtained from Patient Left lower leg cellulitis History of Present Illness (HPI) 12/30/16 Patient presents today for initial evaluation concerning an area over her left lateral ankle which she tells me has been intermittent in nature since 2000. However the current opening has been present for about two weeks. She has been tolerating the dressing changes at home with antibiotic ointment but that is really the only treatment that has been initiated at this point. That is other than the oral antibiotics which was Keflex that patient was placed on by her primary care provider prior to being referred to [place]. She does have some discomfort but rates this to be around a 2-3 out of 10 fortunately the redness does not seem to be  spreading to any other location as far as her lower extremity is concerned. She does tell me that in the past antibiotics have been beneficial for her unfortunately the current antibiotics have not been of benefit and no wound culture was performed as of yet. She has previously seen Dr. 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 She does have diabetes and are most recent blood sugars have run between 104 and 106 according to patient's although her last hemoglobin A1c she is unaware of. Patient's current wound does not appear to be significantly open but is rather more of a weeping region of cellulitis. 01/27/17 on evaluation today patient left lower for many wound continues to show signs of erythema surrounding and in fact she is noted to have erythema from her toes up to just below the knee in regard to left lower extremity. She is not having any fevers, chills, nausea, vomiting, or diarrhea at this point. With that being said she is concerned about the silver alginate dressing. She tells me that in the past she used silver and some of the dressings for previous one and did not do well with it. Nonetheless she has discomfort along the wound itself but no other pain noted throughout the left lower extremity. 02/03/17 on evaluation today patient appears to be doing fairly well overall other than the fact that her wound is worsening on the lateral aspect of her left lower extremity. It appears more macerated and even slough covered at this point. With that being said she has been attempting to perform the dressing changes on her own although I'm not sure that she is understanding exactly how this should be done. Nonetheless I do believe the gentamicin may be causing too much maceration she really has not wanted to utilize the  silver alginate dressings past due to a previous issue with this. Overall her erythema of the left lower extremity is improved she still has significant swelling. No fevers, chills, nausea, or vomiting noted at this time. 02/10/2017 -- Old notes received -- was seen in 2009 by [facility] by Dr. Romie Levee -- his impression was chronic patches of dermatitis on the left lower extremity and current mild swelling. He recommended venous insufficiency venous reflux studies. She had no evidence of arterial insufficiency. Follow-up chronic venous insufficiency  study done on 12/16/2007, showed very mild amount of reflux on the great saphenous vein at the knee but the function of the vein is normal and there was no evidence by the venous reflux study.. They referred her to dermatology for a second opinion and would see her back in about 4-6 weeks. She was seen back in follow-up in January 01 2008 and at that time she was asked to use a steroid ointment locally and the wounds are almost completely healed. The patient has told me today that she does not tolerate silver dressing -- and she has not had home health come and change her dressings over the last week. 02/17/2017 -- - had a lower arterial study done which showed a bilateral ABI suggests no significant lower extremity arterial disease and the toe brachial indicis were normal on the right and abnormal on the left. The right ABI was 1.0 and the left was 1.07. The right toe brachial indices was 0.81 on the right and 0.56 on the left. She had biphasic and triphasic flow at the tibial Upperman, Flo F. ([id number]) vessels. 02/24/2017 -- the patient has been tolerating a 3 layer wrap and seems to be pleased with her progress 03/17/17 on evaluation today patient appears to be doing better in regard to her left lateral lower extremity wound. She has been Tolerating the wraps without complication. No fevers, chills, nausea, or vomiting noted at this time. She is  pleased with how things are progressing. Early complaint is that she is unable to take a shower for such a long time from Wednesday all the way till the following Monday. 06/29/17 on evaluation today patient's left lateral lower extremity wound appears to show signs of improvement compared to when I last saw her. With that being said it has been sometime since I have seen her. She did have a lot of questions today concerning the wound but overall I am somewhat pleased with how this appears. Patient History Information obtained from Patient. Allergies Augmentin, calcium channel blockers, nitrofurantoin, ACE Inhibitors, atorvastatin, Beta-Blockers (Beta-Adrenergic Blocking Agts), Levaquin, pravastatin, Statins-Hmg-Coa Reductase Inhibitors, sulfa, clindamycin, lincomycin, SILVER, doxycycline Family History Cancer - Siblings,Child,Maternal Grandparents, Diabetes - Mother,Siblings, Heart Disease - Father, Hypertension - Siblings, Stroke - Father,Mother, Thyroid Problems - Child, No family history of Hereditary Spherocytosis, Kidney Disease, Lung Disease, Seizures, Tuberculosis. Social History Former smoker - smoked late teens early 68s, Marital Status - Widowed, Alcohol Use - Never, Drug Use - No History, Caffeine Use - Daily. Review of Systems (ROS) Constitutional Symptoms (General Health) Denies complaints or symptoms of Fever, Chills. Respiratory The patient has no complaints or symptoms. Cardiovascular Complains or has symptoms of LE edema. Psychiatric The patient has no complaints or symptoms. Objective Constitutional Well-nourished and well-hydrated in no acute distress. Vitals Time Taken: 12:50 PM, Height: 63 in, Weight: 164.8 lbs, BMI: 29.2, Temperature: 97.9 F, Pulse: 72 bpm, Respiratory Rate: 16 breaths/min, Blood Pressure: 160/84 mmHg. Respiratory normal breathing without difficulty. Werber, Jhoselin F. ([id number]) Psychiatric this patient is able to make decisions and  demonstrates good insight into disease process. Alert and Oriented x 3. pleasant and cooperative. General Notes: Patient did have slough covering the wound bed noted on evaluation today. After discussing this with patient this was sharply debride it which he tolerated without complication and the wound bed appeared to be greatly improved. Integumentary (Hair, Skin) Wound #1 status is Open. Original cause of wound was Gradually Appeared. The wound is located on the Left,Lateral Lower Leg. The wound measures  1.6cm length x 2.5cm width x 0.1cm depth; 3.142cm^2 area and 0.314cm^3 volume. There is Fat Layer (Subcutaneous Tissue) Exposed exposed. There is no tunneling or undermining noted. There is a large amount of serous drainage noted. The wound margin is flat and intact. There is small (1-33%) red granulation within the wound bed. There is a large (67-100%) amount of necrotic tissue within the wound bed including Adherent Slough. The periwound skin appearance exhibited: Maceration, Hemosiderin Staining, Erythema. The periwound skin appearance did not exhibit: Callus, Crepitus, Excoriation, Induration, Rash, Scarring, Dry/Scaly, Atrophie Blanche, Cyanosis, Ecchymosis, Mottled, Pallor, Rubor. The surrounding wound skin color is noted with erythema which is circumferential. Periwound temperature was noted as No Abnormality. The periwound has tenderness on palpation. 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 There was a Skin/Subcutaneous Tissue Debridement (11042- 11047)  debridement with total area of 4 sq cm performed by STONE III, HOYT E., PA-C. with the following instrument(s): Curette to remove Viable tissue/material including Exudate, Fibrin/Slough, and Subcutaneous after achieving pain control using Lidocaine 4% Topical Solution. A time out was conducted at 13:22, prior to the start of the procedure. A Minimum amount of bleeding was controlled with Pressure. The procedure was tolerated well with a pain level of 0 throughout and a pain level of 0 following the procedure. Post Debridement Measurements: 1.6cm length x 2.5cm width x 0.2cm depth; 0.628cm^3 volume. Character of Wound/Ulcer Post Debridement requires further debridement. Severity of Tissue Post Debridement is: Fat layer exposed. Post procedure Diagnosis Wound #1: Same as Pre-Procedure Feighner, Myrakle F. ([id number]) Plan Wound Cleansing: Wound #1 Left,Lateral Lower Leg: Clean wound with wound cleanser. Cleanse wound with mild soap and water May Shower, gently pat wound dry prior to applying new dressing. Other: - pt may take wrap off and shower before nurse comes or before she comes to clinic Anesthetic (add to Medication List): Wound #1 Left,Lateral Lower Leg: Topical Lidocaine 4% cream applied to wound bed prior to debridement (In Clinic Only). Skin Barriers/Peri-Wound Care: Wound #1 Left,Lateral Lower Leg: Antifungal cream - to red areas around wound (do not place on the ulcer itself) ****not a window****completely cover the reddened area around the wound (peri-wound area) Primary Wound Dressing: Wound #1 Left,Lateral Lower Leg: Promogran - lightly moisten with normal saline Secondary Dressing: Wound #1 Left,Lateral Lower Leg: ABD pad Dry Gauze XtraSorb Dressing Change Frequency: Change Dressing Monday, Wednesday, Friday Follow-up Appointments: Return Appointment in 1 week. Edema Control: Wound #1 Left,Lateral Lower Leg: 3 Layer Compression System - Left Lower Extremity - Please wrap  3cm from toes and 3cm from knee. unna to anchor *****DO NOT USE THE COTTON LAYER, PLEASE USE KERLIX INSTEAD***** Elevate legs to the level of the heart and pump ankles as often as possible Additional Orders / Instructions: Wound #1 Left,Lateral Lower Leg: Increase protein intake. Home Health: Wound #1 Left,Lateral Lower Leg: [place] Nurse may visit PRN to address patient s wound care needs. FACE TO FACE ENCOUNTER: MEDICARE and MEDICAID PATIENTS: I certify that this patient is under my care and that I had a face-to-face encounter that meets the physician face-to-face encounter  requirements with this patient on this date. The encounter with the patient was in whole or in part for the following MEDICAL CONDITION: (primary reason for [place]) MEDICAL NECESSITY: I certify, that based on my findings, NURSING services are a medically necessary home health service. HOME BOUND STATUS: I certify that my clinical findings support that this patient is homebound (i.e., Due to illness or injury, pt requires aid of supportive devices such as crutches, cane, wheelchairs, walkers, the use of special transportation or the assistance of another person to leave their place of residence. There is a normal inability to leave the home and doing so requires considerable and taxing effort. Other absences are for medical reasons / religious services and are infrequent or of short duration when for other reasons). If current dressing causes regression in wound condition, may D/C ordered dressing product/s and apply Normal Saline Moist Dressing daily until next [place] / Other MD appointment. [place] of regression in wound condition at [phone]. Please direct any NON-WOUND related issues/requests for orders to patient's Primary Care Physician Medications-please add to medication list.: Wound #1 Left,Lateral Lower Leg: Other: - Vitamin A,  Vitamin C, Zinc, MVI The following medication(s) was prescribed: lidocaine topical 4 % cream 1 1 cream topical was prescribed at facility Man, Leslie F. ([id number]) I'm going to recommend that we switch to a collagen dressing to see if this can be of benefit for patient. Hopefully you will make good progress with this. Otherwise we will just continue with the compression wraps as before. At this time I do not see any evidence of infection which is good news. Please see above for specific wound care orders. We will see patient for re-evaluation in 1 week(s) here in the clinic. If anything worsens or changes patient will contact our office for additional recommendations. Electronic Signature(s) Signed: 07/07/2017 9:53:28 AM By: Gretta Cool, BSN, RN, CWS, Kim RN, BSN Signed: 07/12/2017 7:57:29 AM By: Worthy Keeler PA-C Previous Signature: 06/30/2017 8:05:24 AM Version By: Worthy Keeler PA-C Entered By: Gretta Cool BSN, RN, CWS, Kim on 07/07/2017 09:53:28 Grell, Maysen F. ([id number]) -------------------------------------------------------------------------------- ROS/PFSH Details Patient Name: Laura [name], Isabellamarie F. Date of Service: 06/29/2017 12:30 PM Medical Record Number: [id number] Patient Account Number: [id number] Date of Birth/Sex: 07/24/1924 (84 y.o. Female) Treating RN: Carolyne Fiscal, Debi Primary Care Provider: Halina Maidens Other Clinician: Referring Provider: Halina Maidens Treating Provider/Extender: Melburn Hake, HOYT Weeks in Treatment: 25 Information Obtained From Patient Wound History Do you currently have one or more open woundso Yes How many open wounds do you currently haveo 1 Approximately how long have you had your woundso 2 weeks How have you been treating your wound(s) until nowo clobetasol Has your wound(s) ever healed and then re-openedo Yes Have you had any lab work done in the past montho No Have you tested positive for an antibiotic resistant organism (MRSA, VRE)o No Have you tested  positive for osteomyelitis (bone infection)o Yes Have you had any tests for circulation on your legso Yes Where was the test [facility] Have you had other problems associated with your woundso Swelling Constitutional Symptoms (General Health) Complaints and Symptoms: Negative for: Fever; Chills Cardiovascular Complaints and Symptoms: Positive for: LE edema Respiratory Complaints and Symptoms: No Complaints or Symptoms Endocrine Medical History: Positive for: Type II Diabetes Time with diabetes: 2003 Treated with: Oral agents Psychiatric Complaints and Symptoms: No Complaints or Symptoms Immunizations Pneumococcal Vaccine: Received Pneumococcal Vaccination: Yes Implantable Devices [place]ville, Larena F. ([id number]) Family and Social History  Cancer: Yes - Siblings,Child,Maternal Grandparents; Diabetes: Yes - Mother,Siblings; Heart Disease: Yes - Father; Hereditary Spherocytosis: No; Hypertension: Yes - Siblings; Kidney Disease: No; Lung Disease: No; Seizures: No; Stroke: Yes - Father,Mother; Thyroid Problems: Yes - Child; Tuberculosis: No; Former smoker - smoked late teens early 39s; Marital Status - Widowed; Alcohol Use: Never; Drug Use: No History; Caffeine Use: Daily; Financial Concerns: No; Food, Clothing or Shelter Needs: No; Support System Lacking: No; Transportation Concerns: No; Advanced Directives: No; Patient does not want information on Advanced Directives; Do not resuscitate: No; Living Will: Yes (Not Provided); Medical Power of Attorney: Yes (Not Provided) Physician Affirmation I have reviewed and agree with the above information. Electronic Signature(s) Signed: 06/30/2017 8:05:24 AM By: Worthy Keeler PA-C Signed: 06/30/2017 4:08:09 PM By: Alric Quan Entered By: Worthy Keeler on 06/29/2017 17:37:07 Cislo, Britiney F. ([id number]) -------------------------------------------------------------------------------- SuperBill Details Patient Name: Beckerman, Paola F. Date of Service:  06/29/2017 Medical Record Number: [id number] Patient Account Number: [id number] Date of Birth/Sex: 10-21-1924 (84 y.o. Female) Treating RN: Carolyne Fiscal, Debi Primary Care Provider: Halina Maidens Other Clinician: Referring Provider: Halina Maidens Treating Provider/Extender: Melburn Hake, HOYT Weeks in Treatment: 25 Diagnosis Coding ICD-10 Codes Code Description E11.622 Type 2 diabetes mellitus with other skin ulcer I10 Essential (primary) hypertension I89.0 Lymphedema, not elsewhere classified L97.822 Non-pressure chronic ulcer of other part of left lower leg with fat layer exposed Facility Procedures CPT4 Code Description: [id number] 11042 - DEB SUBQ TISSUE 20 SQ CM/< ICD-10 Diagnosis Description L97.822 Non-pressure chronic ulcer of other part of left lower leg with Modifier: fat layer expos Quantity: 1 ed Physician Procedures CPT4 Code Description: 5625638 93734 - WC PHYS SUBQ TISS 20 SQ CM ICD-10 Diagnosis Description L97.822 Non-pressure chronic ulcer of other part of left lower leg with Modifier: fat layer expos Quantity: 1 ed Electronic Signature(s) Signed: 06/30/2017 8:05:24 AM By: Worthy Keeler PA-C Entered By: Worthy Keeler on 06/29/2017 17:47:23

## 2017-07-04 ENCOUNTER — Ambulatory Visit: Payer: Medicare PPO | Admitting: Internal Medicine

## 2017-07-04 ENCOUNTER — Encounter: Payer: Self-pay | Admitting: Internal Medicine

## 2017-07-04 ENCOUNTER — Other Ambulatory Visit: Payer: Self-pay | Admitting: Internal Medicine

## 2017-07-04 VITALS — BP 120/74 | HR 78 | Ht 62.0 in | Wt 162.0 lb

## 2017-07-04 DIAGNOSIS — I10 Essential (primary) hypertension: Secondary | ICD-10-CM | POA: Diagnosis not present

## 2017-07-04 DIAGNOSIS — Z89412 Acquired absence of left great toe: Secondary | ICD-10-CM

## 2017-07-04 DIAGNOSIS — E1151 Type 2 diabetes mellitus with diabetic peripheral angiopathy without gangrene: Secondary | ICD-10-CM

## 2017-07-04 DIAGNOSIS — S98112A Complete traumatic amputation of left great toe, initial encounter: Secondary | ICD-10-CM

## 2017-07-04 DIAGNOSIS — R1310 Dysphagia, unspecified: Secondary | ICD-10-CM | POA: Diagnosis not present

## 2017-07-04 DIAGNOSIS — L309 Dermatitis, unspecified: Secondary | ICD-10-CM

## 2017-07-04 DIAGNOSIS — R600 Localized edema: Secondary | ICD-10-CM

## 2017-07-04 DIAGNOSIS — R1319 Other dysphagia: Secondary | ICD-10-CM

## 2017-07-04 DIAGNOSIS — M546 Pain in thoracic spine: Secondary | ICD-10-CM

## 2017-07-04 DIAGNOSIS — I5032 Chronic diastolic (congestive) heart failure: Secondary | ICD-10-CM

## 2017-07-04 NOTE — Patient Instructions (Addendum)
Stop amlodipine 2.5 mg daily.  Monitor Blood pressure and swelling at home.

## 2017-07-04 NOTE — Progress Notes (Signed)
Date:  07/04/2017   Name:  Laura [name]   DOB:  03-29-1925   MRN:  [id number]   Chief Complaint: Diabetes and Hypertension Diabetes  She presents for her follow-up diabetic visit. She has type 2 diabetes mellitus. Her disease course has been stable. Pertinent negatives for hypoglycemia include no dizziness, headaches or tremors. Pertinent negatives for diabetes include no chest pain and no weakness. Current diabetic treatment includes oral agent (monotherapy). She is compliant with treatment all of the time. Her weight is stable. She is following a generally healthy diet. There is no change in her home blood glucose trend. Her breakfast blood glucose is taken between 7-8 am. Her breakfast blood glucose range is generally 90-110 mg/dl. An ACE inhibitor/angiotensin II receptor blocker is being taken. Eye exam is current.  Hypertension  This is a chronic problem. The problem is controlled. Associated symptoms include peripheral edema. Pertinent negatives include no chest pain, headaches, palpitations or shortness of breath. (Wants to know if she can stop amlodipine.) Risk factors for coronary artery disease include dyslipidemia and diabetes mellitus. Past treatments include angiotensin blockers, calcium channel blockers, direct vasodilators and diuretics. Hypertensive end-organ damage includes heart failure (class I HF ). There is no history of CAD/MI.  Back Pain  This is a chronic problem. The problem has been gradually worsening since onset. The pain is present in the thoracic spine and lumbar spine (s/p lumbar disc surgery remotely.  Now having more thoracic pain and difficulty straightening up.  Wonders about "micro" disc surgery). The quality of the pain is described as burning and cramping. The pain does not radiate. The pain is moderate. The symptoms are aggravated by twisting, stress and standing. Pertinent negatives include no abdominal pain, chest pain, fever, headaches, numbness, paresthesias or  weakness. She has tried NSAIDs, heat and home exercises for the symptoms. The treatment provided mild relief.  Dysphagia - planning an EGD for stricture soon by Dr. Allen Norris. She is planning to have follow up to discuss further.  Symptoms have progressed but without aspiration. LE Cellulitis - followed by wound clinic for RLE infection. Vascular surgery has cleared her from venous stasis.  The wound is improving very slowly. Pruritis - seeing Dermatology today for persistent itching after taking 2 courses of Doxycycline. Abd pain - has had left lower abd/pelvic discomfort since her fall this past summer.  She feels like there is a fullness there.  It is uncomfortable with reaching or stretching but not consistently.  No change in bowel habits.  No obvious mass.  She is concerned about a hernia and requests an [place].  Review of Systems  Constitutional: Negative for chills, diaphoresis and fever.  HENT: Positive for trouble swallowing.   Eyes: Negative for visual disturbance.  Respiratory: Negative for chest tightness and shortness of breath.   Cardiovascular: Negative for chest pain and palpitations.  Gastrointestinal: Positive for abdominal distention. Negative for abdominal pain, diarrhea and nausea.  Genitourinary: Negative for difficulty urinating.  Musculoskeletal: Positive for arthralgias, back pain and gait problem.  Skin: Positive for color change and wound.  Neurological: Negative for dizziness, tremors, weakness, numbness, headaches and paresthesias.  Hematological: Negative for adenopathy. Bruises/bleeds easily.  Psychiatric/Behavioral: Negative for sleep disturbance.    Patient Active Problem List   Diagnosis Date Noted  . Chronic diastolic CHF (congestive heart failure), NYHA class 2 ([place]) 06/27/2017  . Skin ulcer of knee, left, limited to breakdown of skin ([place]) 03/01/2017  . Long term current use of inhaled  steroid 03/01/2017  . Esophageal dysphagia 02/28/2017  . Arthritis of knee,  right 09/13/2016  . Bursitis of right elbow 09/13/2016  . Vaginal atrophy 10/24/2015  . Mixed stress and urge urinary incontinence 10/24/2015  . Vasculitis ([place]) 09/21/2015  . DM (diabetes mellitus), type 2 with renal complications ([place]) 67/67/2094  . Carpal tunnel syndrome 01/16/2015  . DM (diabetes mellitus), type 2 with peripheral vascular complications ([place]) 70/96/2836  . Dyslipidemia 01/16/2015  . Essential (primary) hypertension 01/16/2015  . Amputated great toe of left foot ([place]) 01/16/2015  . Hypercalcemia 01/16/2015  . Local edema 01/16/2015  . Lumbar radiculopathy 01/16/2015  . MGUS (monoclonal gammopathy of unknown significance) 01/16/2015  . Vascular disorder of skin 01/16/2015  . Dermatitis 01/16/2015  . Hypertensive left ventricular hypertrophy 07/17/2014  . MI (mitral incompetence) 07/17/2014  . TI (tricuspid incompetence) 07/17/2014    Prior to Admission medications   Medication Sig Start Date End Date Taking? Authorizing Provider  ACCU-CHEK AVIVA PLUS test strip USE ONE STRIP TO CHECK GLUCOSE ONCE DAILY 01/29/17  Yes Glean Hess, MD  amLODipine (NORVASC) 2.5 MG tablet Take 1 tablet by mouth daily.   Yes [provider]  aspirin 81 MG chewable tablet Chew 1 tablet by mouth daily.   Yes [provider]  cholecalciferol (VITAMIN D) 1000 units tablet Take 1,000 Units by mouth daily.   Yes [provider]  cloNIDine (CATAPRES) 0.2 MG tablet TAKE ONE TABLET BY MOUTH TWICE DAILY 08/19/16  Yes Glean Hess, MD  COLLAGEN-ANTIMICROBIAL EX Apply topically.   Yes [provider]  fexofenadine (ALLEGRA) 180 MG tablet Take 1 tablet by mouth daily as needed. 12/11/12  Yes [provider]  ibuprofen (ADVIL,MOTRIN) 200 MG tablet Take 400 mg by mouth 2 (two) times daily.    Yes [provider]  metFORMIN (GLUCOPHAGE) 500 MG tablet Take 1 tablet (500 mg total) by mouth daily. 09/13/16  Yes Glean Hess, MD  mometasone  (NASONEX) 50 MCG/ACT nasal spray Place 2 sprays into the nose daily as needed. 09/13/16  Yes Glean Hess, MD  telmisartan (MICARDIS) 80 MG tablet Take 1 tablet by mouth daily. 06/28/17  Yes [provider]  triamterene-hydrochlorothiazide (MAXZIDE-25) 37.5-25 MG tablet Take 1 tablet by mouth daily. 01/29/17  Yes [provider]  clobetasol ointment (TEMOVATE) 0.05 % CLOBETASOL PROPIONATE, 0.05% (External Ointment) - Historical Medication  appication two times daily (0.05 %) Active    [provider]    Allergies  Allergen Reactions  . Augmentin [Amoxicillin-Pot Clavulanate] Other (See Comments)    Vasculitis  . Calcium Channel Blockers Shortness Of Breath  . Nitrofurantoin Shortness Of Breath  . Ace Inhibitors Cough  . Atorvastatin Other (See Comments)  . Beta Adrenergic Blockers     Other reaction(s): Headache  . Levaquin [Levofloxacin]     Unknown - extracted from old chart at [facility]  . Pravastatin Other (See Comments)  . Statins     weakness  . Sulfa Antibiotics     Constipation and Rash  . Clindamycin Rash  . Clindamycin/Lincomycin Rash  . Doxycycline Hives and Itching    Past Surgical History:  Procedure Laterality Date  . APPENDECTOMY    . bone marrow withdrawal    . colon polyp removal    . [place]  . TOE AMPUTATION Left 08/2013  . TOTAL ABDOMINAL HYSTERECTOMY  1963    Social History   Tobacco Use  . Smoking status: Never Smoker  .  Smokeless tobacco: Never Used  Substance Use Topics  . Alcohol use: No    Alcohol/week: 0.0 oz  . Drug use: No     Medication list has been reviewed and updated.  PHQ 2/9 Scores 07/04/2017 03/23/2016 01/16/2015  PHQ - 2 Score 0 0 0    Physical Exam  Constitutional: She is oriented to person, place, and time. She appears well-developed. No distress.  HENT:  Head: Normocephalic and atraumatic.  Cardiovascular: Normal rate, regular rhythm and normal heart sounds.    Pulmonary/Chest: Effort normal and breath sounds normal. No respiratory distress. She has no wheezes.  Abdominal: Soft. Bowel sounds are normal. She exhibits no distension and no mass. There is no tenderness. There is no rebound and no guarding.  Musculoskeletal: She exhibits edema (1+ edema right; left LE in Unna boot).       Cervical back: She exhibits decreased range of motion and tenderness (along right upper thoracic and lower cervical paraspinous muscles; no spinal tenderness).  Lymphadenopathy:    She has no cervical adenopathy.  Neurological: She is alert and oriented to person, place, and time.  Skin: Skin is warm and dry. No rash noted.  Psychiatric: She has a normal mood and affect. Her behavior is normal. Thought content normal.  Nursing note and vitals reviewed.   BP 120/74   Pulse 78   Ht 5\' 2"  (1.575 m)   Wt 162 lb (73.5 kg) Comment: didn't weigh today  SpO2 97%   BMI 29.63 kg/m   Assessment and Plan: 1. DM (diabetes mellitus), type 2 with peripheral vascular complications (HCC) Continue current medications - Hemoglobin A1c - Renal function panel  2. Essential (primary) hypertension controlled  3. Esophageal dysphagia See GI asap for evaluation  4. Local edema Stop amlodipine  5. Dermatitis Continue with Wound care as planned See Dermatology for itching.  6. Thoracic spine pain Consider Ortho evaluation - [facility] or [facility]  7. Chronic diastolic CHF (congestive heart failure), NYHA class 2 ([place]) Followed by Dr. Nehemiah Massed  8. Amputated great toe of left foot ([place])   No orders of the defined types were placed in this encounter.   Partially dictated using Editor, commissioning. Any errors are unintentional.  Halina Maidens, MD [facility]  07/04/2017

## 2017-07-05 ENCOUNTER — Telehealth: Payer: Self-pay

## 2017-07-05 LAB — RENAL FUNCTION PANEL
ALBUMIN: 3.8 g/dL (ref 3.2–4.6)
BUN/Creatinine Ratio: 16 (ref 12–28)
BUN: 22 mg/dL (ref 10–36)
CALCIUM: 9.9 mg/dL (ref 8.7–10.3)
CHLORIDE: 99 mmol/L (ref 96–106)
CO2: 26 mmol/L (ref 20–29)
CREATININE: 1.34 mg/dL — AB (ref 0.57–1.00)
GFR calc Af Amer: 40 mL/min/{1.73_m2} — ABNORMAL LOW (ref >59)
GFR calc non Af Amer: 35 mL/min/{1.73_m2} — ABNORMAL LOW (ref >59)
Glucose: 83 mg/dL (ref 65–99)
Phosphorus: 3.5 mg/dL (ref 2.5–4.5)
Potassium: 4.3 mmol/L (ref 3.5–5.2)
Sodium: 140 mmol/L (ref 134–144)

## 2017-07-05 LAB — HEMOGLOBIN A1C
Est. average glucose Bld gHb Est-mCnc: 117 mg/dL
Hgb A1c MFr Bld: 5.7 % — ABNORMAL HIGH (ref 4.8–5.6)

## 2017-07-05 NOTE — Telephone Encounter (Signed)
Dimple Nanas called to ask for patient son is this patient a candidate for stem cell therapy? LM that I need more info.

## 2017-07-06 ENCOUNTER — Ambulatory Visit: Payer: Medicare PPO | Admitting: Nurse Practitioner

## 2017-07-07 ENCOUNTER — Encounter: Payer: Medicare PPO | Admitting: Physician Assistant

## 2017-07-07 DIAGNOSIS — E11622 Type 2 diabetes mellitus with other skin ulcer: Secondary | ICD-10-CM | POA: Diagnosis not present

## 2017-07-10 NOTE — Progress Notes (Signed)
STEENBERGEN, Shelle F. ([id number]) Visit Report for 07/07/2017 Arrival Information Details Patient Name: Laura [name], [name] Date of Service: 07/07/2017 3:15 PM Medical Record Number: [id number] Patient Account Number: [id number] Date of Birth/Sex: [birth year] (84 y.o. Female) Treating RN: Montey Hora Primary Care Tashena Ibach: Halina Maidens Other Clinician: Referring Abbeygail Igoe: Halina Maidens Treating Linsey Hirota/Extender: Melburn Hake, HOYT Weeks in Treatment: 66 Visit Information History Since Last Visit Added or deleted any medications: No Patient Arrived: Kasandra Knudsen Any new allergies or adverse reactions: No Arrival Time: 15:23 Had a fall or experienced change in No Accompanied By: friend activities of daily living that may affect Transfer Assistance: None risk of falls: Patient Identification Verified: Yes Signs or symptoms of abuse/neglect since last visito No Secondary Verification Process Completed: Yes Hospitalized since last visit: No Patient Requires Transmission-Based Precautions: No Has Dressing in Place as Prescribed: Yes Patient Has Alerts: Yes Has Compression in Place as Prescribed: No Patient Alerts: DM II Pain Present Now: No Electronic Signature(s) Signed: 07/07/2017 5:15:44 PM By: Montey Hora Entered By: Montey Hora on 07/07/2017 15:25:36 Steuber, Roselie F. ([id number]) -------------------------------------------------------------------------------- Encounter Discharge Information Details Patient Name: Schmieder, [name] Date of Service: 07/07/2017 3:15 PM Medical Record Number: [id number] Patient Account Number: [id number] Date of Birth/Sex: August 20, 1924 (84 y.o. Female) Treating RN: Montey Hora Primary Care Aadvik Roker: Halina Maidens Other Clinician: Referring Shaquia Berkley: Halina Maidens Treating Maclane Holloran/Extender: Melburn Hake, HOYT Weeks in Treatment: 20 Encounter Discharge Information Items Discharge Pain Level: 0 Discharge Condition: Stable Ambulatory Status: Cane Discharge Destination:  Home Transportation: Private Auto Accompanied By: friend Schedule Follow-up Appointment: Yes Medication Reconciliation completed and No provided to Patient/Care Justyn Langham: Provided on Clinical Summary of Care: 07/07/2017 Form Type Recipient Paper Patient [facility] Electronic Signature(s) Signed: 07/07/2017 5:06:27 PM By: Montey Hora Entered By: Montey Hora on 07/07/2017 17:06:27 Godlewski, Rayvon F. ([id number]) -------------------------------------------------------------------------------- Lower Extremity Assessment Details Patient Name: Smeltzer, [name] Date of Service: 07/07/2017 3:15 PM Medical Record Number: [id number] Patient Account Number: [id number] Date of Birth/Sex: 19-May-1925 (84 y.o. Female) Treating RN: Montey Hora Primary Care Lasandra Batley: Halina Maidens Other Clinician: Referring Mikle Sternberg: Halina Maidens Treating Charlena Haub/Extender: Melburn Hake, HOYT Weeks in Treatment: 27 Edema Assessment Assessed: [Left: No] [Right: No] [Left: Edema] [Right: :] Calf Left: Right: Point of Measurement: 34 cm From Medial Instep 34.4 cm cm Ankle Left: Right: Point of Measurement: 10 cm From Medial Instep 22.3 cm cm Vascular Assessment Pulses: Dorsalis Pedis Palpable: [Left:Yes] Posterior Tibial Extremity colors, hair growth, and conditions: Extremity Color: [Left:Hyperpigmented] Hair Growth on Extremity: [Left:No] Temperature of Extremity: [Left:Warm] Capillary Refill: [Left:< 3 seconds] Electronic Signature(s) Signed: 07/07/2017 5:15:44 PM By: Montey Hora Entered By: Montey Hora on 07/07/2017 15:35:03 Parmenter, Cherish F. ([id number]) -------------------------------------------------------------------------------- Multi Wound Chart Details Patient Name: Niederer, [name] Date of Service: 07/07/2017 3:15 PM Medical Record Number: [id number] Patient Account Number: [id number] Date of Birth/Sex: Jun 08, 1925 (84 y.o. Female) Treating RN: Montey Hora Primary Care Jeraldean Wechter: Halina Maidens Other  Clinician: Referring Kyleen Villatoro: Halina Maidens Treating Dottie Vaquerano/Extender: Melburn Hake, HOYT Weeks in Treatment: 27 Vital Signs Height(in): 54 Pulse(bpm): 57 Weight(lbs): 164.8 Blood Pressure(mmHg): 143/88 Body Mass Index(BMI): 29 Temperature(F): Respiratory Rate 18 (breaths/min): Photos: [N/A:N/A] Wound Location: Left Lower Leg - Lateral N/A N/A Wounding Event: Gradually Appeared N/A N/A Primary Etiology: Diabetic Wound/Ulcer of the N/A N/A Lower Extremity Comorbid History: Type II Diabetes N/A N/A Date Acquired: 12/16/2016 N/A N/A Weeks of Treatment: 27 N/A N/A Wound Status: Open N/A N/A Measurements L x W x D 2.2x2.4x0.1 N/A N/A (cm) Area (cm) : 4.147 N/A N/A Volume (cm) :  0.415 N/A N/A % Reduction in Area: -322.30% N/A N/A % Reduction in Volume: -323.50% N/A N/A Classification: Grade 1 N/A N/A Exudate Amount: Large N/A N/A Exudate Type: Serous N/A N/A Exudate Color: amber N/A N/A Wound Margin: Flat and Intact N/A N/A Granulation Amount: Medium (34-66%) N/A N/A Granulation Quality: Red N/A N/A Necrotic Amount: Medium (34-66%) N/A N/A Exposed Structures: Fat Layer (Subcutaneous N/A N/A Tissue) Exposed: Yes Fascia: No Tendon: No Muscle: No Joint: No Bone: No Dulski, Adream F. ([id number]) Epithelialization: Small (1-33%) N/A N/A Periwound Skin Texture: Excoriation: No N/A N/A Induration: No Callus: No Crepitus: No Rash: No Scarring: No Periwound Skin Moisture: Maceration: Yes N/A N/A Dry/Scaly: No Periwound Skin Color: Erythema: Yes N/A N/A Hemosiderin Staining: Yes Atrophie Blanche: No Cyanosis: No Ecchymosis: No Mottled: No Pallor: No Rubor: No Erythema Location: Circumferential N/A N/A Temperature: No Abnormality N/A N/A Tenderness on Palpation: Yes N/A N/A Wound Preparation: Ulcer Cleansing: N/A N/A Rinsed/Irrigated with Saline, Other: soap and water Topical Anesthetic Applied: Other: lidocaine 4% Treatment Notes Electronic  Signature(s) Signed: 07/07/2017 5:15:44 PM By: Montey Hora Entered By: Montey Hora on 07/07/2017 16:14:47 Glogowski, Williette F. ([id number]) -------------------------------------------------------------------------------- Multi-Disciplinary Care Plan Details Patient Name: [name], Chelan F. Date of Service: 07/07/2017 3:15 PM Medical Record Number: [id number] Patient Account Number: [id number] Date of Birth/Sex: 02/23/1925 (84 y.o. Female) Treating RN: Montey Hora Primary Care Kimble Delaurentis: Halina Maidens Other Clinician: Referring Alejandra Hunt: Halina Maidens Treating Damire Remedios/Extender: Melburn Hake, HOYT Weeks in Treatment: 33 Active Inactive ` Abuse / Safety / Falls / Self Care Management Nursing Diagnoses: Potential for falls Goals: Patient will not experience any injury related to falls Date Initiated: 12/30/2016 Target Resolution Date: 04/29/2017 Goal Status: Active Interventions: Assess Activities of Daily Living upon admission and as needed Assess: immobility, friction, shearing, incontinence upon admission and as needed Notes: ` Nutrition Nursing Diagnoses: Imbalanced nutrition Impaired glucose control: actual or potential Potential for alteratiion in Nutrition/Potential for imbalanced nutrition Goals: Patient/caregiver will maintain therapeutic glucose control Date Initiated: 12/30/2016 Target Resolution Date: 04/29/2017 Goal Status: Active Interventions: Assess patient nutrition upon admission and as needed per policy Notes: ` Orientation to the Wound Care Program Nursing Diagnoses: Knowledge deficit related to the wound healing center program Goals: Patient/caregiver will verbalize understanding of the [facility] Date Initiated: 12/30/2016 Target Resolution Date: 01/21/2017 Cheuvront, Deryn Wanda Plump ([id number]) Goal Status: Active Interventions: Provide education on orientation to the wound center Notes: ` Pain, Acute or Chronic Nursing Diagnoses: Pain, acute or  chronic: actual or potential Potential alteration in comfort, pain Goals: Patient/caregiver will verbalize adequate pain control between visits Date Initiated: 12/30/2016 Target Resolution Date: 04/29/2017 Goal Status: Active Interventions: Complete pain assessment as per visit requirements Notes: ` Wound/Skin Impairment Nursing Diagnoses: Impaired tissue integrity Knowledge deficit related to smoking impact on wound healing Knowledge deficit related to ulceration/compromised skin integrity Goals: Ulcer/skin breakdown will have a volume reduction of 80% by week 12 Date Initiated: 12/30/2016 Target Resolution Date: 04/22/2017 Goal Status: Active Interventions: Assess patient/caregiver ability to perform ulcer/skin care regimen upon admission and as needed Notes: Electronic Signature(s) Signed: 07/07/2017 5:15:44 PM By: Montey Hora Entered By: Montey Hora on 07/07/2017 16:14:40 Babino, Sharna F. ([id number]) -------------------------------------------------------------------------------- Pain Assessment Details Patient Name: Ibsen, Thera F. Date of Service: 07/07/2017 3:15 PM Medical Record Number: [id number] Patient Account Number: [id number] Date of Birth/Sex: 09-14-1924 (84 y.o. Female) Treating RN: Montey Hora Primary Care Tashona Calk: Halina Maidens Other Clinician: Referring Yavier Snider: Halina Maidens Treating Jaquana Geiger/Extender: Melburn Hake, HOYT Weeks in Treatment: 40 Active Problems Location  of Pain Severity and Description of Pain Patient Has Paino Yes Site Locations Pain Location: Pain in Ulcers With Dressing Change: Yes Duration of the Pain. Constant / Intermittento Constant Pain Management and Medication Current Pain Management: Electronic Signature(s) Signed: 07/07/2017 5:15:44 PM By: Montey Hora Entered By: Montey Hora on 07/07/2017 15:25:54 Maffeo, Malayjah F.  ([id number]) -------------------------------------------------------------------------------- Patient/Caregiver Education Details Patient Name: [name], Altheia F. Date of Service: 07/07/2017 3:15 PM Medical Record Number: [id number] Patient Account Number: [id number] Date of Birth/Gender: 06/06/1925 (84 y.o. Female) Treating RN: Montey Hora Primary Care Physician: Halina Maidens Other Clinician: Referring Physician: Halina Maidens Treating Physician/Extender: Melburn Hake, HOYT Weeks in Treatment: 32 Education Assessment Education Provided To: Patient and Caregiver Education Topics Provided Venous: Handouts: Other: leg elevation Methods: Explain/Verbal Responses: State content correctly Electronic Signature(s) Signed: 07/07/2017 5:15:44 PM By: Montey Hora Entered By: Montey Hora on 07/07/2017 17:06:45 Raatz, Sibyl F. ([id number]) -------------------------------------------------------------------------------- Wound Assessment Details Patient Name: Sherpa, Akaylah F. Date of Service: 07/07/2017 3:15 PM Medical Record Number: [id number] Patient Account Number: [id number] Date of Birth/Sex: [birth year] (84 y.o. Female) Treating RN: Montey Hora Primary Care Seve Monette: Halina Maidens Other Clinician: Referring Daryle Boyington: Halina Maidens Treating Denetta Fei/Extender: Melburn Hake, HOYT Weeks in Treatment: 27 Wound Status Wound Number: 1 Primary Etiology: Diabetic Wound/Ulcer of the Lower Extremity Wound Location: Left Lower Leg - Lateral Wound Status: Open Wounding Event: Gradually Appeared Comorbid Type II Diabetes Date Acquired: 12/16/2016 History: Weeks Of Treatment: 27 Clustered Wound: No Photos Photo Uploaded By: Montey Hora on 07/07/2017 15:38:41 Wound Measurements Length: (cm) 2.2 Width: (cm) 2.4 Depth: (cm) 0.1 Area: (cm) 4.147 Volume: (cm) 0.415 % Reduction in Area: -322.3% % Reduction in Volume: -323.5% Epithelialization: Small (1-33%) Tunneling: No Undermining:  No Wound Description Classification: Grade 1 Foul O Wound Margin: Flat and Intact Slough Exudate Amount: Large Exudate Type: Serous Exudate Color: amber dor After Cleansing: No /Fibrino Yes Wound Bed Granulation Amount: Medium (34-66%) Exposed Structure Granulation Quality: Red Fascia Exposed: No Necrotic Amount: Medium (34-66%) Fat Layer (Subcutaneous Tissue) Exposed: Yes Necrotic Quality: Adherent Slough Tendon Exposed: No Muscle Exposed: No Joint Exposed: No Bone Exposed: No Periwound Skin Texture Herberger, Garnetta F. ([id number]) Texture Color No Abnormalities Noted: No No Abnormalities Noted: No Callus: No Atrophie Blanche: No Crepitus: No Cyanosis: No Excoriation: No Ecchymosis: No Induration: No Erythema: Yes Rash: No Erythema Location: Circumferential Scarring: No Hemosiderin Staining: Yes Mottled: No Moisture Pallor: No No Abnormalities Noted: No Rubor: No Dry / Scaly: No Maceration: Yes Temperature / Pain Temperature: No Abnormality Tenderness on Palpation: Yes Wound Preparation Ulcer Cleansing: Rinsed/Irrigated with Saline, Other: soap and water, Topical Anesthetic Applied: Other: lidocaine 4%, Treatment Notes Wound #1 (Left, Lateral Lower Leg) 1. Cleansed with: Cleanse wound with antibacterial soap and water 2. Anesthetic Topical Lidocaine 4% cream to wound bed prior to debridement 4. Dressing Applied: Promogran 5. Secondary Dressing Applied ABD Pad Dry Gauze Notes unna to anchor, promogran, xtrasorb Electronic Signature(s) Signed: 07/07/2017 5:15:44 PM By: Montey Hora Entered By: Montey Hora on 07/07/2017 15:32:29 Filyaw, Maziah F. ([id number]) -------------------------------------------------------------------------------- Vitals Details Patient Name: Vitug, Loie F. Date of Service: 07/07/2017 3:15 PM Medical Record Number: [id number] Patient Account Number: [id number] Date of Birth/Sex: Nov 10, 1924 (84 y.o. Female) Treating RN: Montey Hora Primary Care Hildred Pharo: Halina Maidens Other Clinician: Referring Lakya Schrupp: Halina Maidens Treating Reg Bircher/Extender: Melburn Hake, HOYT Weeks in Treatment: 50 Vital Signs Time Taken: 15:25 Pulse (bpm): 68 Height (in): 63 Respiratory Rate (breaths/min): 18 Weight (lbs): 164.8 Blood Pressure (mmHg): 143/88 Body Mass Index (BMI): 29.2 Reference  Range: 80 - 120 mg / dl Electronic Signature(s) Signed: 07/07/2017 5:15:44 PM By: Montey Hora Entered By: Montey Hora on 07/07/2017 15:26:41

## 2017-07-10 NOTE — Progress Notes (Signed)
[name], [name] ([id number]) Visit Report for 07/07/2017 Chief Complaint Document Details [name], [name] Date of Service: 07/07/2017 3:15 PM Medical Record Number: [id number] Patient Account Number: [id number] Date of Birth/Sex: May 13, 1925 (84 y.o. Female) Treating RN: Montey Hora Primary Care Provider: Halina Maidens Other Clinician: Referring Provider: Halina Maidens Treating Provider/Extender: Melburn Hake, HOYT Weeks in Treatment: 84 Information Obtained from: Patient Chief Complaint Left lower leg cellulitis Electronic Signature(s) Signed: 07/10/2017 11:19:49 AM By: Worthy Keeler PA-C Entered By: Worthy Keeler on 07/07/2017 16:09:13 [name], [name] ([id number]) -------------------------------------------------------------------------------- Debridement Details Patient Name: Laura [name], [name] Date of Service: 07/07/2017 3:15 PM Medical Record Number: [id number] Patient Account Number: [id number] Date of Birth/Sex: 29-Jun-1924 (84 y.o. Female) Treating RN: Montey Hora Primary Care Provider: Halina Maidens Other Clinician: Referring Provider: Halina Maidens Treating Provider/Extender: Melburn Hake, HOYT Weeks in Treatment: 27 Debridement Performed for Wound #1 Left,Lateral Lower Leg Assessment: Performed By: Physician STONE III, HOYT E., PA-C Debridement: Debridement Severity of Tissue Pre Fat layer exposed Debridement: Pre-procedure Verification/Time Yes - 16:12 Out Taken: Start Time: 16:12 Pain Control: Lidocaine 4% Topical Solution Level: Skin/Subcutaneous Tissue Total Area Debrided (L x W): 2.2 (cm) x 2.4 (cm) = 5.28 (cm) Tissue and other material Viable, Non-Viable, Fibrin/Slough, Subcutaneous debrided: Instrument: Curette Bleeding: Minimum Hemostasis Achieved: Pressure End Time: 16:16 Procedural Pain: 0 Post Procedural Pain: 0 Response to Treatment: Procedure was tolerated well Post Debridement Measurements of Total Wound Length: (cm) 2.2 Width: (cm)  2.4 Depth: (cm) 0.2 Volume: (cm) 0.829 Character of Wound/Ulcer Post Debridement: Improved Severity of Tissue Post Debridement: Fat layer exposed Post Procedure Diagnosis Same as Pre-procedure Electronic Signature(s) Signed: 07/07/2017 5:15:44 PM By: Montey Hora Signed: 07/10/2017 11:19:49 AM By: Worthy Keeler PA-C Entered By: Montey Hora on 07/07/2017 16:19:04 [name], [name] ([id number]) -------------------------------------------------------------------------------- HPI Details [name], [name] Date of Service: 07/07/2017 3:15 PM Medical Record Number: [id number] Patient Account Number: [id number] Date of Birth/Sex: April 14, 1925 (84 y.o. Female) Treating RN: Montey Hora Primary Care Provider: Halina Maidens Other Clinician: Referring Provider: Halina Maidens Treating Provider/Extender: Melburn Hake, HOYT Weeks in Treatment: 6 History of Present Illness HPI Description: 12/30/16 Patient presents today for initial evaluation concerning an area over her left lateral ankle which she tells me has been intermittent in nature since 2000. However the current opening has been present for about two weeks. She has been tolerating the dressing changes at home with antibiotic ointment but that is really the only treatment that has been initiated at this point. That is other than the oral antibiotics which was Keflex that patient was placed on by her primary care provider prior to being referred to [place]. She does have some discomfort but rates this to be around a 2-3 out of 10 fortunately the redness does not seem to be spreading to any other location as far as her lower extremity is concerned. She does tell me that in the past antibiotics have been beneficial for her unfortunately the current antibiotics have not been of benefit and no wound culture was performed as of yet. She has previously seen Dr. 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 She does have diabetes and are most recent blood sugars have run between 104 and 106 according to patient's although her last hemoglobin A1c she is unaware of. Patient's current wound  does not appear to be significantly open but is rather more of a weeping region of cellulitis. 01/27/17 on evaluation today patient left lower for many wound continues to show signs of erythema surrounding and in fact she is noted to have erythema from her toes up to just below the knee in regard to left lower extremity. She is not having any fevers, chills, nausea, vomiting, or diarrhea at this point. With that being said she is concerned about the silver alginate dressing. She tells me that in the past she used silver and some of the dressings for previous one and did not do well with it. Nonetheless she has discomfort along the wound itself but no other pain noted throughout the left lower extremity. 02/03/17 on evaluation today patient appears to be doing fairly well overall other than the fact that her wound is worsening on the lateral aspect of her left lower extremity. It appears more macerated and even slough covered at this point. With that being said she has been attempting to perform the dressing changes on her own although I'm not sure that she is understanding exactly how this should be done. Nonetheless I do believe the gentamicin may be causing too much maceration she really has not wanted to utilize the silver alginate dressings past due to a previous issue with this. Overall her erythema of the left lower extremity is improved she still has significant swelling. No fevers, chills, nausea, or vomiting noted at this time. 02/10/2017 -- Old notes received -- was seen in 2009 by [facility] by Dr. Romie Levee -- his impression was chronic patches of dermatitis on the  left lower extremity and current mild swelling. He recommended venous insufficiency venous reflux studies. She had no evidence of arterial insufficiency. Follow-up chronic venous insufficiency study done on 12/16/2007, showed very mild amount of reflux on the great saphenous vein at the knee but the function of the vein is normal and there was no evidence by the venous reflux study.. They referred her to dermatology for a second opinion and would see her back in about 4-6 weeks. She was seen back in follow-up in January 01 2008 and at that time she was asked to use a steroid ointment locally and the wounds are almost completely healed. The patient has told me today that she does not tolerate silver dressing -- and she has not had home health come and change her dressings over the last week. 02/17/2017 -- - had a lower arterial study done which showed a bilateral ABI suggests no significant lower extremity arterial disease and the toe brachial indicis were normal on the right and abnormal on the left. The right ABI was 1.0 and the left was 1.07. The right toe brachial indices was 0.81 on the right and 0.56 on the left. She had biphasic and triphasic flow at the tibial vessels. 02/24/2017 -- the patient has been tolerating a 3 layer wrap and seems to be pleased with her progress 03/17/17 on evaluation today patient appears to be doing better in regard to her left lateral lower extremity wound. She has been Tolerating the wraps without complication. No fevers, chills, nausea, or vomiting noted at this time. She is pleased with [name], [name] ([id number]) how things are progressing. Early complaint is that she is unable to take a shower for such a long time from Wednesday all the way till the following Monday. 07/07/17 on evaluation today patient appears to be doing better in my opinion regarding  her right lateral loads from the wound. She has been tolerating the dressing changes without complication she does  not have any significant infection as far as I'm concerned at this point. I do believe she has responded well to last week's debridement and subsequent switch to the collagen dressing. Fortunately she is having no severe pain. Electronic Signature(s) Signed: 07/10/2017 11:19:49 AM By: Worthy Keeler PA-C Entered By: Worthy Keeler on 07/07/2017 17:02:56 [name], [name] ([id number]) -------------------------------------------------------------------------------- Physical Exam Details Patient Name: Bakula, Cheila F. Date of Service: 07/07/2017 3:15 PM Medical Record Number: [id number] Patient Account Number: [id number] Date of Birth/Sex: 18-Jul-1924 (84 y.o. Female) Treating RN: Montey Hora Primary Care Provider: Halina Maidens Other Clinician: Referring Provider: Halina Maidens Treating Provider/Extender: STONE III, HOYT Weeks in Treatment: 48 Constitutional Well-nourished and well-hydrated in no acute distress. Respiratory normal breathing without difficulty. Psychiatric this patient is able to make decisions and demonstrates good insight into disease process. Alert and Oriented x 3. pleasant and cooperative. Notes Patient's wound bed does have slough and poor granulation overlying the surface which was debride it away to it excellent service and this is very good news. I'm gonna recommend that we continue with the Current wound care measures as it seems to be doing much better for her at this point including the wraps. Electronic Signature(s) Signed: 07/10/2017 11:19:49 AM By: Worthy Keeler PA-C Entered By: Worthy Keeler on 07/07/2017 17:03:50 [name], [name] ([id number]) -------------------------------------------------------------------------------- Physician Orders Details Patient Name: Monrreal, Kaslyn F. Date of Service: 07/07/2017 3:15 PM Medical Record Number: [id number] Patient Account Number: [id number] Date of Birth/Sex: 1924/08/29 (84 y.o. Female) Treating RN: Montey Hora Primary  Care Provider: Halina Maidens Other Clinician: Referring Provider: Halina Maidens Treating Provider/Extender: Melburn Hake, HOYT Weeks in Treatment: 29 Verbal / Phone Orders: No Diagnosis Coding ICD-10 Coding Code Description E11.622 Type 2 diabetes mellitus with other skin ulcer I10 Essential (primary) hypertension I89.0 Lymphedema, not elsewhere classified L97.822 Non-pressure chronic ulcer of other part of left lower leg with fat layer exposed Wound Cleansing Wound #1 Left,Lateral Lower Leg o Clean wound with wound cleanser. o Cleanse wound with mild soap and water o May Shower, gently pat wound dry prior to applying new dressing. o Other: - pt may take wrap off and shower before nurse comes or before she comes to clinic Anesthetic (add to Medication List) Wound #1 Left,Lateral Lower Leg o Topical Lidocaine 4% cream applied to wound bed prior to debridement (In Clinic Only). Skin Barriers/Peri-Wound Care Wound #1 Left,Lateral Lower Leg o Antifungal cream - to red areas around wound (do not place on the ulcer itself) ****not a window****completely cover the reddened area around the wound (peri-wound area) Primary Wound Dressing Wound #1 Left,Lateral Lower Leg o Promogran - lightly moisten with normal saline Secondary Dressing Wound #1 Left,Lateral Lower Leg o ABD pad o Dry Gauze o XtraSorb Dressing Change Frequency o Change Dressing Monday, Wednesday, Friday Follow-up Appointments o Return Appointment in 1 week. Edema Control Wound #1 Left,Lateral Lower Leg Olgin, Ariona F. ([id number]) o 3 Layer Compression System - Left Lower Extremity - Please wrap 3cm from toes and 3cm from knee. unna to anchor *****DO NOT USE THE COTTON LAYER, PLEASE USE KERLIX INSTEAD***** o Elevate legs to the level of the heart and pump ankles as often as possible Additional Orders / Instructions Wound #1 Left,Lateral Lower Leg o Increase protein intake. Home  Health Wound #1 Lyn[place] Nurse may visit  PRN to address patientos wound care needs. o FACE TO FACE ENCOUNTER: MEDICARE and MEDICAID PATIENTS: I certify that this patient is under my care and that I had a face-to-face encounter that meets the physician face-to-face encounter requirements with this patient on this date. The encounter with the patient was in whole or in part for the following MEDICAL CONDITION: (primary reason for [place]) MEDICAL NECESSITY: I certify, that based on my findings, NURSING services are a medically necessary home health service. HOME BOUND STATUS: I certify that my clinical findings support that this patient is homebound (i.e., Due to illness or injury, pt requires aid of supportive devices such as crutches, cane, wheelchairs, walkers, the use of special transportation or the assistance of another person to leave their place of residence. There is a normal inability to leave the home and doing so requires considerable and taxing effort. Other absences are for medical reasons / religious services and are infrequent or of short duration when for other reasons). o If current dressing causes regression in wound condition, may D/C ordered dressing product/s and apply Normal Saline Moist Dressing daily until next [place] / Other MD appointment. [place] of regression in wound condition at [phone]. o Please direct any NON-WOUND related issues/requests for orders to patient's Primary Care Physician Medications-please add to medication list. Wound #1 Left,Lateral Lower Leg o Other: - Vitamin A, Vitamin C, Zinc, MVI Electronic Signature(s) Signed: 07/07/2017 5:15:44 PM By: Montey Hora Signed: 07/10/2017 11:19:49 AM By: Worthy Keeler PA-C Entered By: Montey Hora on 07/07/2017 16:19:31 [name], [name]  ([id number]) -------------------------------------------------------------------------------- Problem List Details Patient Name: Marcott, Sheetal F. Date of Service: 07/07/2017 3:15 PM Medical Record Number: [id number] Patient Account Number: [id number] Date of Birth/Sex: [birth year] (84 y.o. Female) Treating RN: Montey Hora Primary Care Provider: Halina Maidens Other Clinician: Referring Provider: Halina Maidens Treating Provider/Extender: Melburn Hake, HOYT Weeks in Treatment: 43 Active Problems ICD-10 Encounter Code Description Active Date Diagnosis E11.622 Type 2 diabetes mellitus with other skin ulcer 01/02/2017 Yes I10 Essential (primary) hypertension 01/02/2017 Yes I89.0 Lymphedema, not elsewhere classified 01/16/2017 Yes L97.822 Non-pressure chronic ulcer of other part of left lower leg with fat 06/29/2017 Yes layer exposed Inactive Problems Resolved Problems ICD-10 Code Description Active Date Resolved Date L03.116 Cellulitis of left lower limb 01/02/2017 01/02/2017 Electronic Signature(s) Signed: 07/10/2017 11:19:49 AM By: Worthy Keeler PA-C Entered By: Worthy Keeler on 07/07/2017 16:09:08 Fruin, Kabria F. ([id number]) -------------------------------------------------------------------------------- Progress Note Details Patient Name: Coletti, Hanh F. Date of Service: 07/07/2017 3:15 PM Medical Record Number: [id number] Patient Account Number: [id number] Date of Birth/Sex: 01-15-1925 (84 y.o. Female) Treating RN: Montey Hora Primary Care Provider: Halina Maidens Other Clinician: Referring Provider: Halina Maidens Treating Provider/Extender: Melburn Hake, HOYT Weeks in Treatment: 67 Subjective Chief Complaint Information obtained from Patient Left lower leg cellulitis History of Present Illness (HPI) 12/30/16 Patient presents today for initial evaluation concerning an area over her left lateral ankle which she tells me has been intermittent in nature since 2000. However the current  opening has been present for about two weeks. She has been tolerating the dressing changes at home with antibiotic ointment but that is really the only treatment that has been initiated at this point. That is other than the oral antibiotics which was Keflex that patient was placed on by her primary care provider prior to being referred to [place]. She does have some discomfort but rates this to be around a 2-3 out of 10 fortunately the redness  does not seem to be spreading to any other location as far as her lower extremity is concerned. She does tell me that in the past antibiotics have been beneficial for her unfortunately the current antibiotics have not been of benefit and no wound culture was performed as of yet. She has previously seen Dr. 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 She does have diabetes and are most recent blood sugars have run between 104 and 106 according to patient's although her last hemoglobin A1c she is unaware of. Patient's current wound does not appear to be significantly open but is rather more of a weeping region of cellulitis. 01/27/17 on evaluation today patient left lower for many wound continues to show signs of erythema surrounding and in fact she is noted to have erythema from her toes up to just below the knee in regard to left lower extremity. She is not having any fevers, chills, nausea, vomiting, or diarrhea at this point. With that being said she is concerned about the silver alginate dressing. She tells me that in the past she used silver and some of the dressings for previous one and did not do well with it. Nonetheless she has discomfort along the wound itself but no other pain noted throughout the left lower extremity. 02/03/17 on evaluation today patient appears to be doing fairly  well overall other than the fact that her wound is worsening on the lateral aspect of her left lower extremity. It appears more macerated and even slough covered at this point. With that being said she has been attempting to perform the dressing changes on her own although I'm not sure that she is understanding exactly how this should be done. Nonetheless I do believe the gentamicin may be causing too much maceration she really has not wanted to utilize the silver alginate dressings past due to a previous issue with this. Overall her erythema of the left lower extremity is improved she still has significant swelling. No fevers, chills, nausea, or vomiting noted at this time. 02/10/2017 -- Old notes received -- was seen in 2009 by [facility] by Dr. Romie Levee -- his impression was chronic patches of dermatitis on the left lower extremity and current mild swelling. He recommended venous insufficiency venous reflux studies. She had no evidence of arterial insufficiency. Follow-up chronic venous insufficiency study done on 12/16/2007, showed very mild amount of reflux on the great saphenous vein at the knee but the function of the vein is normal and there was no evidence by the venous reflux study.. They referred her to dermatology for a second opinion and would see her back in about 4-6 weeks. She was seen back in follow-up in January 01 2008 and at that time she was asked to use a steroid ointment locally and the wounds are almost completely healed. The patient has told me today that she does not tolerate silver dressing -- and she has not had home health come and change her dressings over the last week. 02/17/2017 -- - had a lower arterial study done which showed a bilateral ABI suggests no significant lower extremity arterial disease and the toe brachial indicis were normal on the right and abnormal on the left. The right ABI was 1.0 and the left was 1.07. The right toe brachial indices was  0.81 on  the right and 0.56 on the left. She had biphasic and triphasic flow at the tibial [name], [name] ([id number]) vessels. 02/24/2017 -- the patient has been tolerating a 3 layer wrap and seems to be pleased with her progress 03/17/17 on evaluation today patient appears to be doing better in regard to her left lateral lower extremity wound. She has been Tolerating the wraps without complication. No fevers, chills, nausea, or vomiting noted at this time. She is pleased with how things are progressing. Early complaint is that she is unable to take a shower for such a long time from Wednesday all the way till the following Monday. 07/07/17 on evaluation today patient appears to be doing better in my opinion regarding her right lateral loads from the wound. She has been tolerating the dressing changes without complication she does not have any significant infection as far as I'm concerned at this point. I do believe she has responded well to last week's debridement and subsequent switch to the collagen dressing. Fortunately she is having no severe pain. Patient History Information obtained from Patient. Family History Cancer - Siblings,Child,Maternal Grandparents, Diabetes - Mother,Siblings, Heart Disease - Father, Hypertension - Siblings, Stroke - Father,Mother, Thyroid Problems - Child, No family history of Hereditary Spherocytosis, Kidney Disease, Lung Disease, Seizures, Tuberculosis. Social History Former smoker - smoked late teens early 93s, Marital Status - Widowed, Alcohol Use - Never, Drug Use - No History, Caffeine Use - Daily. Review of Systems (ROS) Constitutional Symptoms (General Health) Denies complaints or symptoms of Fever, Chills. Respiratory The patient has no complaints or symptoms. Cardiovascular Complains or has symptoms of LE edema. Psychiatric The patient has no complaints or symptoms. Objective Constitutional Well-nourished and well-hydrated in no acute  distress. Vitals Time Taken: 3:25 PM, Height: 63 in, Weight: 164.8 lbs, BMI: 29.2, Pulse: 68 bpm, Respiratory Rate: 18 breaths/min, Blood Pressure: 143/88 mmHg. Respiratory normal breathing without difficulty. Psychiatric this patient is able to make decisions and demonstrates good insight into disease process. Alert and Oriented x 3. pleasant Seigler, Langston F. ([id number]) and cooperative. General Notes: Patient's wound bed does have slough and poor granulation overlying the surface which was debride it away to it excellent service and this is very good news. I'm gonna recommend that we continue with the Current wound care measures as it seems to be doing much better for her at this point including the wraps. Integumentary (Hair, Skin) Wound #1 status is Open. Original cause of wound was Gradually Appeared. The wound is located on the Left,Lateral Lower Leg. The wound measures 2.2cm length x 2.4cm width x 0.1cm depth; 4.147cm^2 area and 0.415cm^3 volume. There is Fat Layer (Subcutaneous Tissue) Exposed exposed. There is no tunneling or undermining noted. There is a large amount of serous drainage noted. The wound margin is flat and intact. There is medium (34-66%) red granulation within the wound bed. There is a medium (34-66%) amount of necrotic tissue within the wound bed including Adherent Slough. The periwound skin appearance exhibited: Maceration, Hemosiderin Staining, Erythema. The periwound skin appearance did not exhibit: Callus, Crepitus, Excoriation, Induration, Rash, Scarring, Dry/Scaly, Atrophie Blanche, Cyanosis, Ecchymosis, Mottled, Pallor, Rubor. The surrounding wound skin color is noted with erythema which is circumferential. Periwound temperature was noted as No Abnormality. The periwound has tenderness on palpation. Assessment Active Problems ICD-10 E11.622 - Type 2 diabetes mellitus with other skin ulcer I10 - Essential (primary) hypertension I89.0 - Lymphedema, not elsewhere  classified L97.822 - Non-pressure chronic ulcer of other part of left lower leg  with fat layer exposed Procedures Wound #1 Pre-procedure diagnosis of Wound #1 is a Diabetic Wound/Ulcer of the Lower Extremity located on the Left,Lateral Lower Leg .Severity of Tissue Pre Debridement is: Fat layer exposed. There was a Skin/Subcutaneous Tissue Debridement (11042- 11047) debridement with total area of 5.28 sq cm performed by STONE III, HOYT E., PA-C. with the following instrument(s): Curette to remove Viable and Non-Viable tissue/material including Fibrin/Slough and Subcutaneous after achieving pain control using Lidocaine 4% Topical Solution. A time out was conducted at 16:12, prior to the start of the procedure. A Minimum amount of bleeding was controlled with Pressure. The procedure was tolerated well with a pain level of 0 throughout and a pain level of 0 following the procedure. Post Debridement Measurements: 2.2cm length x 2.4cm width x 0.2cm depth; 0.829cm^3 volume. Character of Wound/Ulcer Post Debridement is improved. Severity of Tissue Post Debridement is: Fat layer exposed. Post procedure Diagnosis Wound #1: Same as Pre-Procedure Plan Wound Cleansing: [name], [name] ([id number]) Wound #1 Left,Lateral Lower Leg: Clean wound with wound cleanser. Cleanse wound with mild soap and water May Shower, gently pat wound dry prior to applying new dressing. Other: - pt may take wrap off and shower before nurse comes or before she comes to clinic Anesthetic (add to Medication List): Wound #1 Left,Lateral Lower Leg: Topical Lidocaine 4% cream applied to wound bed prior to debridement (In Clinic Only). Skin Barriers/Peri-Wound Care: Wound #1 Left,Lateral Lower Leg: Antifungal cream - to red areas around wound (do not place on the ulcer itself) ****not a window****completely cover the reddened area around the wound (peri-wound area) Primary Wound Dressing: Wound #1 Left,Lateral Lower Leg: Promogran  - lightly moisten with normal saline Secondary Dressing: Wound #1 Left,Lateral Lower Leg: ABD pad Dry Gauze XtraSorb Dressing Change Frequency: Change Dressing Monday, Wednesday, Friday Follow-up Appointments: Return Appointment in 1 week. Edema Control: Wound #1 Left,Lateral Lower Leg: 3 Layer Compression System - Left Lower Extremity - Please wrap 3cm from toes and 3cm from knee. unna to anchor *****DO NOT USE THE COTTON LAYER, PLEASE USE KERLIX INSTEAD***** Elevate legs to the level of the heart and pump ankles as often as possible Additional Orders / Instructions: Wound #1 Left,Lateral Lower Leg: Increase protein intake. Home Health: Wound #1 Left,Lateral Lower Leg: [place] Nurse may visit PRN to address patient s wound care needs. FACE TO FACE ENCOUNTER: MEDICARE and MEDICAID PATIENTS: I certify that this patient is under my care and that I had a face-to-face encounter that meets the physician face-to-face encounter requirements with this patient on this date. The encounter with the patient was in whole or in part for the following MEDICAL CONDITION: (primary reason for [place]) MEDICAL NECESSITY: I certify, that based on my findings, NURSING services are a medically necessary home health service. HOME BOUND STATUS: I certify that my clinical findings support that this patient is homebound (i.e., Due to illness or injury, pt requires aid of supportive devices such as crutches, cane, wheelchairs, walkers, the use of special transportation or the assistance of another person to leave their place of residence. There is a normal inability to leave the home and doing so requires considerable and taxing effort. Other absences are for medical reasons / religious services and are infrequent or of short duration when for other reasons). If current dressing causes regression in wound condition, may D/C ordered dressing product/s and apply Normal  Saline Moist Dressing daily until next [place] / Other MD appointment.  [place] of regression in wound condition at [phone]. Please direct any NON-WOUND related issues/requests for orders to patient's Primary Care Physician Medications-please add to medication list.: Wound #1 Left,Lateral Lower Leg: Other: - Vitamin A, Vitamin C, Zinc, MVI Snowball, Aariah F. ([id number]) We will at this point in time continue with the Current wound care measures as patient seems to be responding very well in my opinion. We will see were things stand in one weeks time. Hopefully she will continue to see improvement week by week in regard to the quality of the granulation tissue and overall appearance of the wound bed. Please see above for specific wound care orders. We will see patient for re-evaluation in 1 week(s) here in the clinic. If anything worsens or changes patient will contact our office for additional recommendations. Electronic Signature(s) Signed: 07/10/2017 11:19:49 AM By: Worthy Keeler PA-C Entered By: Worthy Keeler on 07/07/2017 17:04:18 [name], [name] ([id number]) -------------------------------------------------------------------------------- ROS/PFSH Details Patient Name: Ramnauth, Hunter F. Date of Service: 07/07/2017 3:15 PM Medical Record Number: [id number] Patient Account Number: [id number] Date of Birth/Sex: 11/25/1924 (84 y.o. Female) Treating RN: Montey Hora Primary Care Provider: Halina Maidens Other Clinician: Referring Provider: Halina Maidens Treating Provider/Extender: Melburn Hake, HOYT Weeks in Treatment: 20 Information Obtained From Patient Wound History Do you currently have one or more open woundso Yes How many open wounds do you currently haveo 1 Approximately how long have you had your woundso 2 weeks How have you been treating your wound(s) until nowo clobetasol Has your wound(s) ever healed and then re-openedo Yes Have you had any lab  work done in the past montho No Have you tested positive for an antibiotic resistant organism (MRSA, VRE)o No Have you tested positive for osteomyelitis (bone infection)o Yes Have you had any tests for circulation on your legso Yes Where was the test [facility]o [facility] Have you had other problems associated with your woundso Swelling Constitutional Symptoms (General Health) Complaints and Symptoms: Negative for: Fever; Chills Cardiovascular Complaints and Symptoms: Positive for: LE edema Respiratory Complaints and Symptoms: No Complaints or Symptoms Endocrine Medical History: Positive for: Type II Diabetes Time with diabetes: 2003 Treated with: Oral agents Psychiatric Complaints and Symptoms: No Complaints or Symptoms Immunizations Pneumococcal Vaccine: Received Pneumococcal Vaccination: Yes Implantable Devices Woelfel, [name] ([id number]) Family and Social History Cancer: Yes - Siblings,Child,Maternal Grandparents; Diabetes: Yes - Mother,Siblings; Heart Disease: Yes - Father; Hereditary Spherocytosis: No; Hypertension: Yes - Siblings; Kidney Disease: No; Lung Disease: No; Seizures: No; Stroke: Yes - Father,Mother; Thyroid Problems: Yes - Child; Tuberculosis: No; Former smoker - smoked late teens early 37s; Marital Status - Widowed; Alcohol Use: Never; Drug Use: No History; Caffeine Use: Daily; Financial Concerns: No; Food, Clothing or Shelter Needs: No; Support System Lacking: No; Transportation Concerns: No; Advanced Directives: No; Patient does not want information on Advanced Directives; Do not resuscitate: No; Living Will: Yes (Not Provided); Medical Power of Attorney: Yes (Not Provided) Physician Affirmation I have reviewed and agree with the above information. Electronic Signature(s) Signed: 07/07/2017 5:15:44 PM By: Montey Hora Signed: 07/10/2017 11:19:49 AM By: Worthy Keeler PA-C Entered By: Worthy Keeler on 07/07/2017 17:03:28 Kiel, Holleigh F.  ([id number]) -------------------------------------------------------------------------------- SuperBill Details Patient Name: Goupil, Lucretia F. Date of Service: 07/07/2017 Medical Record Number: [id number] Patient Account Number: [id number] Date of Birth/Sex: 1925/05/14 (84 y.o. Female) Treating RN: Montey Hora Primary Care Provider: Halina Maidens Other Clinician: Referring Provider: Halina Maidens Treating Provider/Extender: STONE III, HOYT Weeks in Treatment: 27 Diagnosis Coding  ICD-10 Codes Code Description E11.622 Type 2 diabetes mellitus with other skin ulcer I10 Essential (primary) hypertension I89.0 Lymphedema, not elsewhere classified L97.822 Non-pressure chronic ulcer of other part of left lower leg with fat layer exposed Facility Procedures CPT4 Code Description: 06015615 11042 - DEB SUBQ TISSUE 20 SQ CM/< ICD-10 Diagnosis Description L97.822 Non-pressure chronic ulcer of other part of left lower leg with Modifier: fat layer expos Quantity: 1 ed Physician Procedures CPT4 Code Description: 3794327 61470 - WC PHYS SUBQ TISS 20 SQ CM ICD-10 Diagnosis Description L97.822 Non-pressure chronic ulcer of other part of left lower leg with Modifier: fat layer expos Quantity: 1 ed Electronic Signature(s) Signed: 07/10/2017 11:19:49 AM By: Worthy Keeler PA-C Entered By: Worthy Keeler on 07/07/2017 17:04:31

## 2017-07-14 ENCOUNTER — Encounter: Payer: Medicare PPO | Admitting: Physician Assistant

## 2017-07-14 ENCOUNTER — Ambulatory Visit: Payer: Medicare PPO | Admitting: Internal Medicine

## 2017-07-14 ENCOUNTER — Encounter: Payer: Self-pay | Admitting: Internal Medicine

## 2017-07-14 VITALS — BP 140/96 | HR 67 | Ht 62.0 in | Wt 162.0 lb

## 2017-07-14 DIAGNOSIS — N3001 Acute cystitis with hematuria: Secondary | ICD-10-CM

## 2017-07-14 DIAGNOSIS — N183 Chronic kidney disease, stage 3 unspecified: Secondary | ICD-10-CM

## 2017-07-14 DIAGNOSIS — L97821 Non-pressure chronic ulcer of other part of left lower leg limited to breakdown of skin: Secondary | ICD-10-CM

## 2017-07-14 DIAGNOSIS — E11622 Type 2 diabetes mellitus with other skin ulcer: Secondary | ICD-10-CM | POA: Diagnosis not present

## 2017-07-14 LAB — POC URINALYSIS WITH MICROSCOPIC (NON AUTO)MANUAL RESULT
Bilirubin, UA: NEGATIVE
CRYSTALS: 0
Epithelial cells, urine per micros: 2
Glucose, UA: NEGATIVE
KETONES UA: NEGATIVE
MUCUS UA: 0
NITRITE UA: NEGATIVE
PROTEIN UA: NEGATIVE
RBC: 2 M/uL — AB (ref 4.04–5.48)
SPEC GRAV UA: 1.01 (ref 1.010–1.025)
Urobilinogen, UA: 0.2 E.U./dL
WBC CASTS UA: 20
pH, UA: 6.5 (ref 5.0–8.0)

## 2017-07-14 NOTE — Progress Notes (Signed)
Date:  07/14/2017   Name:  Laura [name]   DOB:  1924/09/01   MRN:  [id number]   Chief Complaint: Urinary Tract Infection Urinary Tract Infection   This is a new problem. The current episode started in the past 7 days. The problem occurs every urination. The problem has been unchanged. There has been no fever. Associated symptoms include frequency, hesitancy and urgency. Pertinent negatives include no chills or hematuria. She has tried nothing for the symptoms.   LE Ulceration - seen by wound clinic today.  Wound is improving with current care.  She was given an Rx for Omnicef 300 mg bid to begin if her leg worsens    Review of Systems  Constitutional: Negative for chills, fatigue and fever.  Respiratory: Negative for chest tightness and shortness of breath.   Cardiovascular: Negative for chest pain and palpitations.  Genitourinary: Positive for dysuria, frequency, hesitancy and urgency. Negative for hematuria.    Patient Active Problem List   Diagnosis Date Noted  . Thoracic spine pain 07/04/2017  . Chronic diastolic CHF (congestive heart failure), NYHA class 2 ([place]) 06/27/2017  . Skin ulcer of knee, left, limited to breakdown of skin ([place]) 03/01/2017  . Long term current use of inhaled steroid 03/01/2017  . Esophageal dysphagia 02/28/2017  . Arthritis of knee, right 09/13/2016  . Bursitis of right elbow 09/13/2016  . Vaginal atrophy 10/24/2015  . Mixed stress and urge urinary incontinence 10/24/2015  . Vasculitis ([place]) 09/21/2015  . DM (diabetes mellitus), type 2 with renal complications ([place]) 01/60/1093  . Carpal tunnel syndrome 01/16/2015  . DM (diabetes mellitus), type 2 with peripheral vascular complications ([place]) 23/55/7322  . Dyslipidemia 01/16/2015  . Essential (primary) hypertension 01/16/2015  . Amputated great toe of left foot ([place]) 01/16/2015  . Hypercalcemia 01/16/2015  . Local edema 01/16/2015  . Lumbar radiculopathy 01/16/2015  . MGUS (monoclonal gammopathy of  unknown significance) 01/16/2015  . Vascular disorder of skin 01/16/2015  . Dermatitis 01/16/2015  . Hypertensive left ventricular hypertrophy 07/17/2014  . MI (mitral incompetence) 07/17/2014  . TI (tricuspid incompetence) 07/17/2014    Prior to Admission medications   Medication Sig Start Date End Date Taking? Authorizing Provider  ACCU-CHEK AVIVA PLUS test strip USE ONE STRIP TO CHECK GLUCOSE ONCE DAILY 01/29/17  Yes Glean Hess, MD  aspirin 81 MG chewable tablet Chew 1 tablet by mouth daily.   Yes [provider]  cholecalciferol (VITAMIN D) 1000 units tablet Take 1,000 Units by mouth daily.   Yes [provider]  clobetasol ointment (TEMOVATE) 0.05 % CLOBETASOL PROPIONATE, 0.05% (External Ointment) - Historical Medication  appication two times daily (0.05 %) Active   Yes [provider]  cloNIDine (CATAPRES) 0.2 MG tablet TAKE ONE TABLET BY MOUTH TWICE DAILY 08/19/16  Yes Glean Hess, MD  COLLAGEN-ANTIMICROBIAL EX Apply topically.   Yes [provider]  fexofenadine (ALLEGRA) 180 MG tablet Take 1 tablet by mouth daily as needed. 12/11/12  Yes [provider]  ibuprofen (ADVIL,MOTRIN) 200 MG tablet Take 400 mg by mouth 2 (two) times daily.    Yes [provider]  metFORMIN (GLUCOPHAGE) 500 MG tablet Take 1 tablet (500 mg total) by mouth daily. 09/13/16  Yes Glean Hess, MD  mometasone (NASONEX) 50 MCG/ACT nasal spray Place 2 sprays into the nose daily as needed. 09/13/16  Yes Glean Hess, MD  telmisartan (MICARDIS) 80 MG tablet Take 1 tablet by mouth daily. 06/28/17  Yes [provider]  triamterene-hydrochlorothiazide (MAXZIDE-25) 37.5-25 MG tablet Take 1 tablet by mouth daily. 01/29/17  Yes [provider]    Allergies  Allergen Reactions  . Augmentin [Amoxicillin-Pot Clavulanate] Other (See Comments)    Vasculitis  . Calcium Channel Blockers Shortness Of Breath  . Nitrofurantoin Shortness Of  Breath  . Ace Inhibitors Cough  . Beta Adrenergic Blockers     Other reaction(s): Headache  . Levaquin [Levofloxacin]     Unknown - extracted from old chart at [place]  . Statins     weakness  . Sulfa Antibiotics     Constipation and Rash  . Clindamycin Rash  . Doxycycline Hives and Itching    Past Surgical History:  Procedure Laterality Date  . APPENDECTOMY    . bone marrow withdrawal    . colon polyp removal    . [place]  . TOE AMPUTATION Left 08/2013  . TOTAL ABDOMINAL HYSTERECTOMY  1963    Social History   Tobacco Use  . Smoking status: Never Smoker  . Smokeless tobacco: Never Used  Substance Use Topics  . Alcohol use: No    Alcohol/week: 0.0 oz  . Drug use: No     Medication list has been reviewed and updated.  PHQ 2/9 Scores 07/04/2017 03/23/2016 01/16/2015  PHQ - 2 Score 0 0 0    Physical Exam  Constitutional: She is oriented to person, place, and time. She appears well-developed. No distress.  HENT:  Head: Normocephalic and atraumatic.  Neck: Normal range of motion. Neck supple.  Cardiovascular: Normal rate, regular rhythm and normal heart sounds.  Pulmonary/Chest: Effort normal and breath sounds normal. No respiratory distress. She has no wheezes.  Musculoskeletal: She exhibits edema.  Neurological: She is alert and oriented to person, place, and time.  Skin: Skin is warm and dry. No rash noted.  Psychiatric: She has a normal mood and affect. Her behavior is normal. Thought content normal.  Nursing note and vitals reviewed.   BP (!) 140/96   Pulse 67   Ht 5\' 2"  (1.575 m)   Wt 162 lb (73.5 kg)   SpO2 98%   BMI 29.63 kg/m   Assessment and Plan: 1. Acute cystitis with hematuria Will recommend starting Omnicef and check cx - POC urinalysis w microscopic (non auto) - Urine Culture  2. Skin ulcer of knee, left, limited to breakdown of skin ([place]) improving  3. CKD (chronic kidney disease) stage 3, GFR 30-59 ml/min  (HCC) Continue hydration   No orders of the defined types were placed in this encounter.   Partially dictated using Editor, commissioning. Any errors are unintentional.  Halina Maidens, MD [facility]  07/14/2017

## 2017-07-18 ENCOUNTER — Other Ambulatory Visit: Payer: Self-pay | Admitting: Internal Medicine

## 2017-07-18 ENCOUNTER — Telehealth: Payer: Self-pay

## 2017-07-18 DIAGNOSIS — N3000 Acute cystitis without hematuria: Secondary | ICD-10-CM

## 2017-07-18 MED ORDER — CEFUROXIME AXETIL 250 MG PO TABS: 250.0000 mg | ORAL_TABLET | Freq: Two times a day (BID) | ORAL | 0 refills | Status: AC

## 2017-07-18 NOTE — Progress Notes (Signed)
[name], [name] ([id number]) Visit Report for 07/14/2017 Chief Complaint Document Details Patient Name: PARTCH, [name] Date of Service: 07/14/2017 1:30 PM Medical Record Number: [id number] Patient Account Number: [id number] Date of Birth/Sex: 06-30-1924 (84 y.o. Female) Treating RN: Ahmed Prima Primary Care Provider: Halina Maidens Other Clinician: Referring Provider: Halina Maidens Treating Provider/Extender: Melburn Hake, HOYT Weeks in Treatment: 28 Information Obtained from: Patient Chief Complaint Left lower leg cellulitis Electronic Signature(s) Signed: 07/15/2017 12:23:43 AM By: Worthy Keeler PA-C Entered By: Worthy Keeler on 07/14/2017 14:04:16 [name], [name] ([id number]) -------------------------------------------------------------------------------- Debridement Details Patient Name: Laura Glatter, [name] Date of Service: 07/14/2017 1:30 PM Medical Record Number: [id number] Patient Account Number: [id number] Date of Birth/Sex: 1925/04/21 (84 y.o. Female) Treating RN: Carolyne Fiscal, Debi Primary Care Provider: Halina Maidens Other Clinician: Referring Provider: Halina Maidens Treating Provider/Extender: Melburn Hake, HOYT Weeks in Treatment: 28 Debridement Performed for Wound #1 Left,Lateral Lower Leg Assessment: Performed By: Physician STONE III, HOYT E., PA-C Debridement: Debridement Severity of Tissue Pre Fat layer exposed Debridement: Pre-procedure Verification/Time Yes - 14:07 Out Taken: Start Time: 14:08 Pain Control: Lidocaine 4% Topical Solution Level: Skin/Subcutaneous Tissue Total Area Debrided (L x W): 2 (cm) x 2.5 (cm) = 5 (cm) Tissue and other material Viable, Non-Viable, Exudate, Fibrin/Slough, Subcutaneous debrided: Instrument: Curette Bleeding: Minimum Hemostasis Achieved: Pressure End Time: 14:11 Procedural Pain: 0 Post Procedural Pain: 0 Response to Treatment: Procedure was tolerated well Post Debridement Measurements of Total Wound Length: (cm) 2 Width:  (cm) 2.5 Depth: (cm) 0.2 Volume: (cm) 0.785 Character of Wound/Ulcer Post Debridement: Requires Further Debridement Severity of Tissue Post Debridement: Fat layer exposed Post Procedure Diagnosis Same as Pre-procedure Electronic Signature(s) Signed: 07/14/2017 5:25:50 PM By: Alric Quan Signed: 07/15/2017 12:23:43 AM By: Worthy Keeler PA-C Entered By: Alric Quan on 07/14/2017 14:10:03 [name], [name] ([id number]) -------------------------------------------------------------------------------- HPI Details [name], [name] Date of Service: 07/14/2017 1:30 PM Medical Record Number: [id number] Patient Account Number: [id number] Date of Birth/Sex: Apr 19, 1925 (84 y.o. Female) Treating RN: Carolyne Fiscal, Debi Primary Care Provider: Halina Maidens Other Clinician: Referring Provider: Halina Maidens Treating Provider/Extender: Melburn Hake, HOYT Weeks in Treatment: 28 History of Present Illness HPI Description: 12/30/16 Patient presents today for initial evaluation concerning an area over her left lateral ankle which she tells me has been intermittent in nature since 2000. However the current opening has been present for about two weeks. She has been tolerating the dressing changes at home with antibiotic ointment but that is really the only treatment that has been initiated at this point. That is other than the oral antibiotics which was Keflex that patient was placed on by her primary care provider prior to being referred to [place]. She does have some discomfort but rates this to be around a 2-3 out of 10 fortunately the redness does not seem to be spreading to any other location as far as her lower extremity is concerned. She does tell me that in the past antibiotics have been beneficial for her unfortunately the current antibiotics have not been of benefit and no wound culture was performed as of yet. She has previously seen Dr. 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 She does have diabetes and are most recent blood sugars have run between 104 and 106 according to patient's although her last hemoglobin A1c she is unaware of.  Patient's current wound does not appear to be significantly open but is rather more of a weeping region of cellulitis. 01/27/17 on evaluation today patient left lower for many wound continues to show signs of erythema surrounding and in fact she is noted to have erythema from her toes up to just below the knee in regard to left lower extremity. She is not having any fevers, chills, nausea, vomiting, or diarrhea at this point. With that being said she is concerned about the silver alginate dressing. She tells me that in the past she used silver and some of the dressings for previous one and did not do well with it. Nonetheless she has discomfort along the wound itself but no other pain noted throughout the left lower extremity. 02/03/17 on evaluation today patient appears to be doing fairly well overall other than the fact that her wound is worsening on the lateral aspect of her left lower extremity. It appears more macerated and even slough covered at this point. With that being said she has been attempting to perform the dressing changes on her own although I'm not sure that she is understanding exactly how this should be done. Nonetheless I do believe the gentamicin may be causing too much maceration she really has not wanted to utilize the silver alginate dressings past due to a previous issue with this. Overall her erythema of the left lower extremity is improved she still has significant swelling. No fevers, chills, nausea, or vomiting noted at this time. 02/10/2017 -- Old notes received -- was seen in 2009 by [facility] by Dr. Romie Levee -- his impression was  chronic patches of dermatitis on the left lower extremity and current mild swelling. He recommended venous insufficiency venous reflux studies. She had no evidence of arterial insufficiency. Follow-up chronic venous insufficiency study done on 12/16/2007, showed very mild amount of reflux on the great saphenous vein at the knee but the function of the vein is normal and there was no evidence by the venous reflux study.. They referred her to dermatology for a second opinion and would see her back in about 4-6 weeks. She was seen back in follow-up in January 01 2008 and at that time she was asked to use a steroid ointment locally and the wounds are almost completely healed. The patient has told me today that she does not tolerate silver dressing -- and she has not had home health come and change her dressings over the last week. 02/17/2017 -- - had a lower arterial study done which showed a bilateral ABI suggests no significant lower extremity arterial disease and the toe brachial indicis were normal on the right and abnormal on the left. The right ABI was 1.0 and the left was 1.07. The right toe brachial indices was 0.81 on the right and 0.56 on the left. She had biphasic and triphasic flow at the tibial vessels. 02/24/2017 -- the patient has been tolerating a 3 layer wrap and seems to be pleased with her progress 03/17/17 on evaluation today patient appears to be doing better in regard to her left lateral lower extremity wound. She has been Tolerating the wraps without complication. No fevers, chills, nausea, or vomiting noted at this time. She is pleased with Beebe, Keylee F. ([id number]) how things are progressing. Early complaint is that she is unable to take a shower for such a long time from Wednesday all the way till the following Monday. 07/14/17 on evaluation today patient appears to be doing decently well  in regard to her left lateral lower extremity ulcer. She has been tolerating the dressing  changes without complication. With that being said she does have more irritation noted today compared to last week as far as redness surrounding and up the interior portion of her shin. I do not believe this is pressure at this time. I likely think this is more of a cellulitis type issue although fortunately she does not seem to have any significant discomfort. In these compared to previous. She does have slough overlying the wound bed but otherwise it appears to be doing well. Electronic Signature(s) Signed: 07/15/2017 12:23:43 AM By: Worthy Keeler PA-C Entered By: Worthy Keeler on 07/14/2017 17:40:22 [name], [name] ([id number]) -------------------------------------------------------------------------------- Physical Exam Details Patient Name: Kluesner, Jonni F. Date of Service: 07/14/2017 1:30 PM Medical Record Number: [id number] Patient Account Number: [id number] Date of Birth/Sex: [birth year] (84 y.o. Female) Treating RN: Ahmed Prima Primary Care Provider: Halina Maidens Other Clinician: Referring Provider: Halina Maidens Treating Provider/Extender: STONE III, HOYT Weeks in Treatment: 50 Constitutional Well-nourished and well-hydrated in no acute distress. Respiratory normal breathing without difficulty. Psychiatric this patient is able to make decisions and demonstrates good insight into disease process. Alert and Oriented x 3. pleasant and cooperative. Notes Slough on the surface of the wound was sharply debride it away today which patient tolerated with good result she had only minimal discomfort. Other than the erythema surrounding the wound actually appears to be doing very well in following last week's debridement is significantly smaller this week. Electronic Signature(s) Signed: 07/15/2017 12:23:43 AM By: Worthy Keeler PA-C Entered By: Worthy Keeler on 07/14/2017 17:40:54 [name], [name]  ([id number]) -------------------------------------------------------------------------------- Physician Orders Details [name], [name] Date of Service: 07/14/2017 1:30 PM Medical Record Number: [id number] Patient Account Number: [id number] Date of Birth/Sex: 06-Dec-1924 (84 y.o. Female) Treating RN: Carolyne Fiscal, Debi Primary Care Provider: Halina Maidens Other Clinician: Referring Provider: Halina Maidens Treating Provider/Extender: Melburn Hake, HOYT Weeks in Treatment: 50 Verbal / Phone Orders: Yes Clinician: Carolyne Fiscal, Debi Read Back and Verified: Yes Diagnosis Coding ICD-10 Coding Code Description E11.622 Type 2 diabetes mellitus with other skin ulcer I10 Essential (primary) hypertension I89.0 Lymphedema, not elsewhere classified L97.822 Non-pressure chronic ulcer of other part of left lower leg with fat layer exposed Wound Cleansing Wound #1 Left,Lateral Lower Leg o Clean wound with wound cleanser. o Cleanse wound with mild soap and water o May Shower, gently pat wound dry prior to applying new dressing. o Other: - pt may take wrap off and shower before nurse comes or before she comes to clinic Anesthetic (add to Medication List) Wound #1 Left,Lateral Lower Leg o Topical Lidocaine 4% cream applied to wound bed prior to debridement (In Clinic Only). Skin Barriers/Peri-Wound Care Wound #1 Left,Lateral Lower Leg o Antifungal cream - to red areas around wound (do not place on the ulcer itself) ****not a window****completely cover the reddened area around the wound (peri-wound area) Primary Wound Dressing Wound #1 Left,Lateral Lower Leg o Promogran - lightly moisten with normal saline Secondary Dressing Wound #1 Left,Lateral Lower Leg o ABD pad o Dry Gauze o XtraSorb Dressing Change Frequency o Change Dressing Monday, Wednesday, Friday Follow-up Appointments o Return Appointment in 1 week. Edema Control Wound #1 Left,Lateral Lower Leg [name],  [name] ([id number]) o 3 Layer Compression System - Left Lower Extremity - Please wrap 3cm from toes and 3cm from knee. unna to anchor *****DO NOT USE THE COTTON LAYER, PLEASE USE KERLIX INSTEAD***** o Elevate legs  to the level of the heart and pump ankles as often as possible Additional Orders / Instructions Wound #1 Left,Lateral Lower Leg o Increase protein intake. Home Health Wound #1 [place] Nurse may visit PRN to address patientos wound care needs. o FACE TO FACE ENCOUNTER: MEDICARE and MEDICAID PATIENTS: I certify that this patient is under my care and that I had a face-to-face encounter that meets the physician face-to-face encounter requirements with this patient on this date. The encounter with the patient was in whole or in part for the following MEDICAL CONDITION: (primary reason for [place]) MEDICAL NECESSITY: I certify, that based on my findings, NURSING services are a medically necessary home health service. HOME BOUND STATUS: I certify that my clinical findings support that this patient is homebound (i.e., Due to illness or injury, pt requires aid of supportive devices such as crutches, cane, wheelchairs, walkers, the use of special transportation or the assistance of another person to leave their place of residence. There is a normal inability to leave the home and doing so requires considerable and taxing effort. Other absences are for medical reasons / religious services and are infrequent or of short duration when for other reasons). o If current dressing causes regression in wound condition, may D/C ordered dressing product/s and apply Normal Saline Moist Dressing daily until next [place] / Other MD appointment. Klein of regression in wound condition at [phone]. o Please direct any NON-WOUND related issues/requests for orders to patient's Primary Care  Physician Medications-please add to medication list. Wound #1 Left,Lateral Lower Leg o Other: - Vitamin A, Vitamin C, Zinc, MVI Patient Medications Allergies: Augmentin, calcium channel blockers, nitrofurantoin, ACE Inhibitors, atorvastatin, Beta-Blockers (Beta-Adrenergic Blocking Agts), Levaquin, pravastatin, Statins-Hmg-Coa Reductase Inhibitors, sulfa, clindamycin, lincomycin, SILVER, doxycycline Notifications Medication Indication Start End lidocaine DOSE 1 - topical 4 % cream - 1 cream topical cefdinir 07/14/2017 DOSE 1 - oral 300 mg capsule - 1 capsule oral taken 2 times a day for 14 days Electronic Signature(s) Signed: 07/14/2017 5:38:54 PM By: Worthy Keeler PA-C Previous Signature: 07/14/2017 5:25:50 PM Version By: Alric Quan Entered By: Worthy Keeler on 07/14/2017 17:38:53 [place]uth, Michi F. ([id number]) -------------------------------------------------------------------------------- Prescription 07/14/2017 Patient Name: Laura Glatter, Hildagarde F. Provider: Worthy Keeler PA-C Date of Birth: [birth year] NPI#: [id number] Sex: F DEA#: [id number] Phone #: [phone] License #: Patient Address: [facility] [place], [place] [address] [address] [place], [place] [address] [phone] Allergies Augmentin calcium channel blockers nitrofurantoin ACE Inhibitors atorvastatin Beta-Blockers (Beta-Adrenergic Blocking Agts) Levaquin pravastatin Statins-Hmg-Coa Reductase Inhibitors sulfa clindamycin lincomycin SILVER doxycycline Medication [name], [name] ([id number]) Medication: Route: Strength: Form: lidocaine 4 % topical cream topical 4% cream Class: TOPICAL LOCAL ANESTHETICS Dose: Frequency / Time: Indication: 1 1 cream topical Number of Refills: Number of Units: 0 Generic Substitution: Start Date: End Date: One Time Use: Substitution Permitted No Note to  Pharmacy: Signature(s): Date(s): Electronic Signature(s) Signed: 07/15/2017 12:23:43 AM By: Worthy Keeler PA-C Previous Signature: 07/14/2017 5:25:50 PM Version By: Alric Quan Entered By: Worthy Keeler on 07/14/2017 17:38:54 [name], [name] ([id number]) --------------------------------------------------------------------------------  Problem List Details [name], [name] Date of Service: 07/14/2017 1:30 PM Medical Record Number: [id number] Patient Account Number: [id number] Date of Birth/Sex: 1924-06-30 (84 y.o. Female) Treating RN: Carolyne Fiscal, Debi Primary Care Provider: Halina Maidens Other Clinician: Referring Provider: Halina Maidens Treating Provider/Extender: Joaquim Lai  III, HOYT Weeks in Treatment: 28 Active Problems ICD-10 Encounter Code Description Active Date Diagnosis E11.622 Type 2 diabetes mellitus with other skin ulcer 01/02/2017 Yes I10 Essential (primary) hypertension 01/02/2017 Yes I89.0 Lymphedema, not elsewhere classified 01/16/2017 Yes L97.822 Non-pressure chronic ulcer of other part of left lower leg with fat 06/29/2017 Yes layer exposed Inactive Problems Resolved Problems ICD-10 Code Description Active Date Resolved Date L03.116 Cellulitis of left lower limb 01/02/2017 01/02/2017 Electronic Signature(s) Signed: 07/15/2017 12:23:43 AM By: Worthy Keeler PA-C Entered By: Worthy Keeler on 07/14/2017 14:04:08 [name], [name] ([id number]) -------------------------------------------------------------------------------- Progress Note Details Patient Name: Rann, Bernita F. Date of Service: 07/14/2017 1:30 PM Medical Record Number: [id number] Patient Account Number: [id number] Date of Birth/Sex: 23-Apr-1925 (84 y.o. Female) Treating RN: Carolyne Fiscal, Debi Primary Care Provider: Halina Maidens Other Clinician: Referring Provider: Halina Maidens Treating Provider/Extender: Melburn Hake, HOYT Weeks in Treatment: 28 Subjective Chief Complaint Information obtained from  Patient Left lower leg cellulitis History of Present Illness (HPI) 12/30/16 Patient presents today for initial evaluation concerning an area over her left lateral ankle which she tells me has been intermittent in nature since 2000. However the current opening has been present for about two weeks. She has been tolerating the dressing changes at home with antibiotic ointment but that is really the only treatment that has been initiated at this point. That is other than the oral antibiotics which was Keflex that patient was placed on by her primary care provider prior to being referred to [place]. She does have some discomfort but rates this to be around a 2-3 out of 10 fortunately the redness does not seem to be spreading to any other location as far as her lower extremity is concerned. She does tell me that in the past antibiotics have been beneficial for her unfortunately the current antibiotics have not been of benefit and no wound culture was performed as of yet. She has previously seen Dr. 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 She does have diabetes and are most recent blood sugars have run between 104 and 106 according to patient's although her last hemoglobin A1c she is unaware of. Patient's current wound does not appear to be significantly open but is rather more of a weeping region of cellulitis. 01/27/17 on evaluation today patient left lower for many wound continues to show signs of erythema surrounding and in fact she is noted to have erythema from her toes up to just below the knee in regard to left lower extremity. She is not having any fevers, chills, nausea, vomiting, or diarrhea at this point. With that being said she is concerned about the silver alginate dressing. She tells me that in the past she used silver  and some of the dressings for previous one and did not do well with it. Nonetheless she has discomfort along the wound itself but no other pain noted throughout the left lower extremity. 02/03/17 on evaluation today patient appears to be doing fairly well overall other than the fact that her wound is worsening on the lateral aspect of her left lower extremity. It appears more macerated and even slough covered at this point. With that being said she has been attempting to perform the dressing changes on her own although I'm not sure that she is understanding exactly how this should be done. Nonetheless  I do believe the gentamicin may be causing too much maceration she really has not wanted to utilize the silver alginate dressings past due to a previous issue with this. Overall her erythema of the left lower extremity is improved she still has significant swelling. No fevers, chills, nausea, or vomiting noted at this time. 02/10/2017 -- Old notes received -- was seen in 2009 by [facility] by Dr. Romie Levee -- his impression was chronic patches of dermatitis on the left lower extremity and current mild swelling. He recommended venous insufficiency venous reflux studies. She had no evidence of arterial insufficiency. Follow-up chronic venous insufficiency study done on 12/16/2007, showed very mild amount of reflux on the great saphenous vein at the knee but the function of the vein is normal and there was no evidence by the venous reflux study.. They referred her to dermatology for a second opinion and would see her back in about 4-6 weeks. She was seen back in follow-up in January 01 2008 and at that time she was asked to use a steroid ointment locally and the wounds are almost completely healed. The patient has told me today that she does not tolerate silver dressing -- and she has not had home health come and change her dressings over the last week. 02/17/2017 -- - had a lower arterial study done  which showed a bilateral ABI suggests no significant lower extremity arterial disease and the toe brachial indicis were normal on the right and abnormal on the left. The right ABI was 1.0 and the left was 1.07. The right toe brachial indices was 0.81 on the right and 0.56 on the left. She had biphasic and triphasic flow at the tibial Kipper, Anastasya F. ([id number]) vessels. 02/24/2017 -- the patient has been tolerating a 3 layer wrap and seems to be pleased with her progress 03/17/17 on evaluation today patient appears to be doing better in regard to her left lateral lower extremity wound. She has been Tolerating the wraps without complication. No fevers, chills, nausea, or vomiting noted at this time. She is pleased with how things are progressing. Early complaint is that she is unable to take a shower for such a long time from Wednesday all the way till the following Monday. 07/14/17 on evaluation today patient appears to be doing decently well in regard to her left lateral lower extremity ulcer. She has been tolerating the dressing changes without complication. With that being said she does have more irritation noted today compared to last week as far as redness surrounding and up the interior portion of her shin. I do not believe this is pressure at this time. I likely think this is more of a cellulitis type issue although fortunately she does not seem to have any significant discomfort. In these compared to previous. She does have slough overlying the wound bed but otherwise it appears to be doing well. Patient History Information obtained from Patient. Family History Cancer - Siblings,Child,Maternal Grandparents, Diabetes - Mother,Siblings, Heart Disease - Father, Hypertension - Siblings, Stroke - Father,Mother, Thyroid Problems - Child, No family history of Hereditary Spherocytosis, Kidney Disease, Lung Disease, Seizures, Tuberculosis. Social History Former smoker - smoked late teens early 55s,  Marital Status - Widowed, Alcohol Use - Never, Drug Use - No History, Caffeine Use - Daily. Review of Systems (ROS) Constitutional Symptoms (General Health) Denies complaints or symptoms of Fever, Chills. Respiratory The patient has no complaints or symptoms. Cardiovascular Complains or has symptoms of LE edema. Psychiatric The patient  has no complaints or symptoms. Objective Constitutional Well-nourished and well-hydrated in no acute distress. Vitals Time Taken: 1:58 PM, Height: 63 in, Weight: 164.8 lbs, BMI: 29.2, Temperature: 98.2 F, Pulse: 64 bpm, Respiratory Rate: 18 breaths/min, Blood Pressure: 157/79 mmHg. Respiratory normal breathing without difficulty. Stanwood, Dynastie F. ([id number]) Psychiatric this patient is able to make decisions and demonstrates good insight into disease process. Alert and Oriented x 3. pleasant and cooperative. General Notes: Slough on the surface of the wound was sharply debride it away today which patient tolerated with good result she had only minimal discomfort. Other than the erythema surrounding the wound actually appears to be doing very well in following last week's debridement is significantly smaller this week. Integumentary (Hair, Skin) Wound #1 status is Open. Original cause of wound was Gradually Appeared. The wound is located on the Left,Lateral Lower Leg. The wound measures 2cm length x 2.5cm width x 0.1cm depth; 3.927cm^2 area and 0.393cm^3 volume. There is Fat Layer (Subcutaneous Tissue) Exposed exposed. There is no tunneling or undermining noted. There is a large amount of serous drainage noted. The wound margin is flat and intact. There is medium (34-66%) red granulation within the wound bed. There is a medium (34-66%) amount of necrotic tissue within the wound bed including Adherent Slough. The periwound skin appearance exhibited: Maceration, Hemosiderin Staining, Erythema. The periwound skin appearance did not exhibit: Callus, Crepitus,  Excoriation, Induration, Rash, Scarring, Dry/Scaly, Atrophie Blanche, Cyanosis, Ecchymosis, Mottled, Pallor, Rubor. The surrounding wound skin color is noted with erythema which is circumferential. Periwound temperature was noted as No Abnormality. The periwound has tenderness on palpation. 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 There was a Skin/Subcutaneous Tissue Debridement (11042- 11047) debridement with total area of 5 sq cm performed by STONE III, HOYT E., PA-C. with the following instrument(s): Curette to remove Viable and Non-Viable tissue/material including Exudate, Fibrin/Slough, and Subcutaneous after achieving pain control using Lidocaine 4% Topical Solution. A time out was conducted at 14:07, prior to the start of the procedure. A Minimum amount of bleeding was controlled with Pressure. The procedure was tolerated well with a pain level of 0 throughout and a pain level of 0 following the procedure. Post Debridement Measurements: 2cm length x 2.5cm width x 0.2cm depth; 0.785cm^3 volume. Character of Wound/Ulcer Post Debridement requires further debridement. Severity of Tissue Post Debridement is: Fat layer exposed. Post procedure Diagnosis Wound #1: Same as Pre-Procedure Degraaf, Laura [name] F. ([id number]) Plan Wound Cleansing: Wound #1 Left,Lateral Lower Leg: Clean wound with wound cleanser. Cleanse wound with mild soap and water May Shower, gently pat wound dry prior to applying new dressing. Other: - pt may take wrap off and shower before nurse comes or before  she comes to clinic Anesthetic (add to Medication List): Wound #1 Left,Lateral Lower Leg: Topical Lidocaine 4% cream applied to wound bed prior to debridement (In Clinic Only). Skin Barriers/Peri-Wound Care: Wound #1 Left,Lateral Lower Leg: Antifungal cream - to red areas around wound (do not place on the ulcer itself) ****not a window****completely cover the reddened area around the wound (peri-wound area) Primary Wound Dressing: Wound #1 Left,Lateral Lower Leg: Promogran -  lightly moisten with normal saline Secondary Dressing: Wound #1 Left,Lateral Lower Leg: ABD pad Dry Gauze XtraSorb Dressing Change Frequency: Change Dressing Monday, Wednesday, Friday Follow-up Appointments: Return Appointment in 1 week. Edema Control: Wound #1 Left,Lateral Lower Leg: 3 Layer Compression System - Left Lower Extremity - Please wrap 3cm from toes and 3cm from knee. unna to anchor *****DO NOT USE THE COTTON LAYER, PLEASE USE KERLIX INSTEAD***** Elevate legs to the level of the heart and pump ankles as often as possible Additional Orders / Instructions: Wound #1 Left,Lateral Lower Leg: Increase protein intake. Home Health: Wound #1 Left,Lateral Lower Leg: [place] Nurse may visit PRN to address patient s wound care needs. FACE TO FACE ENCOUNTER: MEDICARE and MEDICAID PATIENTS: I certify that this patient is under my care and that I had a face-to-face encounter that meets the physician face-to-face encounter requirements with this patient on this date. The encounter with the patient was in whole or in part for the following MEDICAL CONDITION: (primary reason for [place]) MEDICAL NECESSITY: I certify, that based on my findings, NURSING services are a medically necessary home health service. HOME BOUND STATUS: I certify that my clinical findings support that this patient is homebound (i.e., Due to illness or injury, pt requires aid of supportive devices such as  crutches, cane, wheelchairs, walkers, the use of special transportation or the assistance of another person to leave their place of residence. There is a normal inability to leave the home and doing so requires considerable and taxing effort. Other absences are for medical reasons / religious services and are infrequent or of short duration when for other reasons). If current dressing causes regression in wound condition, may D/C ordered dressing product/s and apply Normal Saline Moist Dressing daily until next [place] / Other MD appointment. [place] of regression in wound condition at [phone]. Please direct any NON-WOUND related issues/requests for orders to patient's Primary Care Physician Medications-please add to medication list.: Wound #1 Left,Lateral Lower Leg: Other: - Vitamin A, Vitamin C, Zinc, MVI The following medication(s) was prescribed: lidocaine topical 4 % cream 1 1 cream topical was prescribed at facility cefdinir oral 300 mg capsule 1 1 capsule oral taken 2 times a day for 14 days starting 07/14/2017 Tubby, Nikea F. ([id number]) I am going to recommend that we continue with the Current wound care measures for the next weeks time. I am going to give her prescription for [facility] although she tells me she's going to see her primary care provider this afternoon and she's unsure if they are gonna want her on an antibiotic. For that reason I did actually write the prescription by hand for her. That way if they decide they would like to do something different they can do that at that point. Otherwise we are going to see her for reevaluation in one weeks time to see were things stand at that point. Please see above for specific wound care orders. We will see patient for re-evaluation in 1 week(s) here in the clinic. If anything worsens or changes patient will contact our office for additional recommendations. Electronic Signature(s) Signed: 07/15/2017  12:23:43 AM By: Worthy Keeler PA-C Entered By: Worthy Keeler on 07/14/2017 17:41:43 Yepez, Shatiqua F. ([id number]) -------------------------------------------------------------------------------- ROS/PFSH Details Patient Name: Magoon, Carlyn F. Date of Service: 07/14/2017 1:30 PM Medical Record Number: [id number] Patient Account Number: [id number] Date of Birth/Sex: [birth year] (84 y.o. Female) Treating RN: Carolyne Fiscal, Debi Primary Care Provider: Halina Maidens  Other Clinician: Referring Provider: Halina Maidens Treating Provider/Extender: Melburn Hake, HOYT Weeks in Treatment: 28 Information Obtained From Patient Wound History Do you currently have one or more open woundso Yes How many open wounds do you currently haveo 1 Approximately how long have you had your woundso 2 weeks How have you been treating your wound(s) until nowo clobetasol Has your wound(s) ever healed and then re-openedo Yes Have you had any lab work done in the past montho No Have you tested positive for an antibiotic resistant organism (MRSA, VRE)o No Have you tested positive for osteomyelitis (bone infection)o Yes Have you had any tests for circulation on your legso Yes [facility] the test [facility] Have you had other problems associated with your woundso Swelling Constitutional Symptoms (General Health) Complaints and Symptoms: Negative for: Fever; Chills Cardiovascular Complaints and Symptoms: Positive for: LE edema Respiratory Complaints and Symptoms: No Complaints or Symptoms Endocrine Medical History: Positive for: Type II Diabetes Time with diabetes: 2003 Treated with: Oral agents Psychiatric Complaints and Symptoms: No Complaints or Symptoms Immunizations Pneumococcal Vaccine: Received Pneumococcal Vaccination: Yes Implantable Devices Palladino, Aarya F. ([id number]) Family and Social History Cancer: Yes - Siblings,Child,Maternal Grandparents; Diabetes: Yes - Mother,Siblings; Heart Disease: Yes -  Father; Hereditary Spherocytosis: No; Hypertension: Yes - Siblings; Kidney Disease: No; Lung Disease: No; Seizures: No; Stroke: Yes - Father,Mother; Thyroid Problems: Yes - Child; Tuberculosis: No; Former smoker - smoked late teens early 93s; Marital Status - Widowed; Alcohol Use: Never; Drug Use: No History; Caffeine Use: Daily; Financial Concerns: No; Food, Clothing or Shelter Needs: No; Support System Lacking: No; Transportation Concerns: No; Advanced Directives: No; Patient does not want information on Advanced Directives; Do not resuscitate: No; Living Will: Yes (Not Provided); Medical Power of Attorney: Yes (Not Provided) Physician Affirmation I have reviewed and agree with the above information. Electronic Signature(s) Signed: 07/15/2017 12:23:43 AM By: Worthy Keeler PA-C Signed: 07/17/2017 5:10:21 PM By: Alric Quan Entered By: Worthy Keeler on 07/14/2017 17:40:41 Wahlberg, Shirah F. ([id number]) -------------------------------------------------------------------------------- SuperBill Details Patient Name: Forester, Dorotha F. Date of Service: 07/14/2017 Medical Record Number: [id number] Patient Account Number: [id number] Date of Birth/Sex: February 14, 1925 (84 y.o. Female) Treating RN: Carolyne Fiscal, Debi Primary Care Provider: Halina Maidens Other Clinician: Referring Provider: Halina Maidens Treating Provider/Extender: Melburn Hake, HOYT Weeks in Treatment: 28 Diagnosis Coding ICD-10 Codes Code Description E11.622 Type 2 diabetes mellitus with other skin ulcer I10 Essential (primary) hypertension I89.0 Lymphedema, not elsewhere classified L97.822 Non-pressure chronic ulcer of other part of left lower leg with fat layer exposed Facility Procedures CPT4 Code Description: [id number] 11042 - DEB SUBQ TISSUE 20 SQ CM/< ICD-10 Diagnosis Description L97.822 Non-pressure chronic ulcer of other part of left lower leg with Modifier: fat layer expos Quantity: 1 ed Physician Procedures CPT4 Code  Description: [id number] - WC PHYS SUBQ TISS 20 SQ CM ICD-10 Diagnosis Description L97.822 Non-pressure chronic ulcer of other part of left lower leg with Modifier: fat layer expos Quantity: 1 ed Electronic Signature(s) Signed: 07/15/2017 12:23:43 AM By: Worthy Keeler PA-C Entered By: Worthy Keeler on 07/14/2017 17:41:54

## 2017-07-18 NOTE — Telephone Encounter (Signed)
Laura [name] informed of medication sent in.

## 2017-07-18 NOTE — Telephone Encounter (Signed)
Patient's friend that comes with her to her visits called stating patient is having side affects of omnicef medication. She is having diarrhea and does not feel well. Wanted to know if we can send something different in.  Please Advise.

## 2017-07-18 NOTE — Progress Notes (Signed)
VANDERVEER, Willella F. ([id number]) Visit Report for 07/14/2017 Arrival Information Details Patient Name: [name], [name] Date of Service: 07/14/2017 1:30 PM Medical Record Number: [id number] Patient Account Number: [id number] Date of Birth/Sex: 12-12-1924 (84 y.o. Female) Treating RN: Carolyne Fiscal, Debi Primary Care [place]: Halina Maidens Other Clinician: Referring [place]: Halina Maidens Treating Zimir Kittleson/Extender: Melburn Hake, HOYT Weeks in Treatment: 40 Visit Information History Since Last Visit All ordered tests and consults were completed: No Patient Arrived: Kasandra Knudsen Added or deleted any medications: No Arrival Time: 13:51 Any new allergies or adverse reactions: No Accompanied By: friend Had a fall or experienced change in No Transfer Assistance: EasyPivot Patient activities of daily living that may affect Lift risk of falls: Patient Identification Verified: Yes Signs or symptoms of abuse/neglect since last visito No Secondary Verification Process Yes Hospitalized since last visit: No Completed: Has Dressing in Place as Prescribed: Yes Patient Requires Transmission-Based No Precautions: Pain Present Now: No Patient Has Alerts: Yes Patient Alerts: DM II Electronic Signature(s) Signed: 07/14/2017 5:25:50 PM By: Alric Quan Entered By: Alric Quan on 07/14/2017 13:51:58 Laura [name], [name] ([id number]) -------------------------------------------------------------------------------- Encounter Discharge Information Details Patient Name: Laura [name], [name] Date of Service: 07/14/2017 1:30 PM Medical Record Number: [id number] Patient Account Number: [id number] Date of Birth/Sex: [birth year] (84 y.o. Female) Treating RN: Carolyne Fiscal, Debi Primary Care Schwanda Zima: Halina Maidens Other Clinician: Referring Kersti Scavone: Halina Maidens Treating Annlouise Gerety/Extender: Melburn Hake, HOYT Weeks in Treatment: 88 Encounter Discharge Information Items Discharge Pain Level: 0 Discharge Condition: Stable Ambulatory  Status: Cane Discharge Destination: Home Transportation: Private Auto Accompanied By: friend Schedule Follow-up Appointment: Yes Medication Reconciliation completed and No provided to Patient/Care Annibelle Brazie: Provided on Clinical Summary of Care: 07/14/2017 Form Type Recipient Paper Patient [facility] Electronic Signature(s) Signed: 07/18/2017 10:47:22 AM By: Ruthine Dose Entered By: Ruthine Dose on 07/14/2017 14:30:53 Laura [name], [name] ([id number]) -------------------------------------------------------------------------------- Lower Extremity Assessment Details Patient Name: [name], [name] Date of Service: 07/14/2017 1:30 PM Medical Record Number: [id number] Patient Account Number: [id number] Date of Birth/Sex: Jun 21, 1924 (84 y.o. Female) Treating RN: Carolyne Fiscal, Debi Primary Care Avondre Richens: Halina Maidens Other Clinician: Referring Arria Naim: Halina Maidens Treating Tyren Dugar/Extender: Melburn Hake, HOYT Weeks in Treatment: 28 Edema Assessment Assessed: [Left: No] [Right: No] [Left: Edema] [Right: :] Calf Left: Right: Point of Measurement: 34 cm From Medial Instep 34.4 cm cm Ankle Left: Right: Point of Measurement: 10 cm From Medial Instep 22.2 cm cm Vascular Assessment Pulses: Dorsalis Pedis Palpable: [Left:Yes] Posterior Tibial Extremity colors, hair growth, and conditions: Extremity Color: [Left:Red] Temperature of Extremity: [Left:Warm] Capillary Refill: [Left:< 3 seconds] Electronic Signature(s) Signed: 07/14/2017 5:25:50 PM By: Alric Quan Entered By: Alric Quan on 07/14/2017 13:57:37 Laura [name], [name] ([id number]) -------------------------------------------------------------------------------- Multi Wound Chart Details Patient Name: [name], [name] Date of Service: 07/14/2017 1:30 PM Medical Record Number: [id number] Patient Account Number: [id number] Date of Birth/Sex: 1925-04-09 (84 y.o. Female) Treating RN: Carolyne Fiscal, Debi Primary Care Javarie Crisp: Halina Maidens Other  Clinician: Referring Khrista Braun: Halina Maidens Treating Edward Trevino/Extender: Melburn Hake, HOYT Weeks in Treatment: 28 Vital Signs Height(in): 49 Pulse(bpm): 15 Weight(lbs): 164.8 Blood Pressure(mmHg): 157/79 Body Mass Index(BMI): 29 Temperature(F): 98.2 Respiratory Rate 18 (breaths/min): Photos: [1:No Photos] [N/A:N/A] Wound Location: [1:Left Lower Leg - Lateral] [N/A:N/A] Wounding Event: [1:Gradually Appeared] [N/A:N/A] Primary Etiology: [1:Diabetic Wound/Ulcer of the Lower Extremity] [N/A:N/A] Comorbid History: [1:Type II Diabetes] [N/A:N/A] Date Acquired: [1:12/16/2016] [N/A:N/A] Weeks of Treatment: [1:28] [N/A:N/A] Wound Status: [1:Open] [N/A:N/A] Measurements L x W x D [1:2x2.5x0.1] [N/A:N/A] (cm) Area (cm) : [1:3.927] [N/A:N/A] Volume (cm) : [1:0.393] [N/A:N/A] % Reduction in Area: [1:-299.90%] [N/A:N/A] %  Reduction in Volume: [1:-301.00%] [N/A:N/A] Classification: [1:Grade 1] [N/A:N/A] Exudate Amount: [1:Large] [N/A:N/A] Exudate Type: [1:Serous] [N/A:N/A] Exudate Color: [1:amber] [N/A:N/A] Wound Margin: [1:Flat and Intact] [N/A:N/A] Granulation Amount: [1:Medium (34-66%)] [N/A:N/A] Granulation Quality: [1:Red] [N/A:N/A] Necrotic Amount: [1:Medium (34-66%)] [N/A:N/A] Exposed Structures: [1:Fat Layer (Subcutaneous Tissue) Exposed: Yes Fascia: No Tendon: No Muscle: No Joint: No Bone: No] [N/A:N/A] Epithelialization: [1:Small (1-33%)] [N/A:N/A] Periwound Skin Texture: [1:Excoriation: No Induration: No Callus: No Crepitus: No Rash: No Scarring: No] [N/A:N/A] Periwound Skin Moisture: Maceration: Yes N/A N/A Dry/Scaly: No Periwound Skin Color: Erythema: Yes N/A N/A Hemosiderin Staining: Yes Atrophie Blanche: No Cyanosis: No Ecchymosis: No Mottled: No Pallor: No Rubor: No Erythema Location: Circumferential N/A N/A Temperature: No Abnormality N/A N/A Tenderness on Palpation: Yes N/A N/A Wound Preparation: Ulcer Cleansing: N/A N/A Rinsed/Irrigated with  Saline, Other: soap and water Topical Anesthetic Applied: Other: lidocaine 4% Treatment Notes Electronic Signature(s) Signed: 07/14/2017 5:25:50 PM By: Alric Quan Entered By: Alric Quan on 07/14/2017 14:01:16 Tesch, Marshella F. ([id number]) -------------------------------------------------------------------------------- [place] Details Patient Name: Laura [name], [name] Date of Service: 07/14/2017 1:30 PM Medical Record Number: [id number] Patient Account Number: [id number] Date of Birth/Sex: 09-23-1924 (84 y.o. Female) Treating RN: Carolyne Fiscal, Debi Primary Care Valda Christenson: Halina Maidens Other Clinician: Referring Shawntel Farnworth: Halina Maidens Treating Ashleymarie Granderson/Extender: Melburn Hake, HOYT Weeks in Treatment: 28 Active Inactive ` Abuse / Safety / Falls / Self Care Management Nursing Diagnoses: Potential for falls Goals: Patient will not experience any injury related to falls Date Initiated: 12/30/2016 Target Resolution Date: 04/29/2017 Goal Status: Active Interventions: Assess Activities of Daily Living upon admission and as needed Assess: immobility, friction, shearing, incontinence upon admission and as needed Notes: ` Nutrition Nursing Diagnoses: Imbalanced nutrition Impaired glucose control: actual or potential Potential for alteratiion in Nutrition/Potential for imbalanced nutrition Goals: Patient/caregiver will maintain therapeutic glucose control Date Initiated: 12/30/2016 Target Resolution Date: 04/29/2017 Goal Status: Active Interventions: Assess patient nutrition upon admission and as needed per policy Notes: ` Orientation to the Wound Care Program Nursing Diagnoses: Knowledge deficit related to the wound healing center program Goals: Patient/caregiver will verbalize understanding of the [place] Date Initiated: 12/30/2016 Target Resolution Date: 01/21/2017 Schaaf, Blanka Wanda Plump ([id number]) Goal Status: Active Interventions: Provide  education on orientation to the wound center Notes: ` Pain, Acute or Chronic Nursing Diagnoses: Pain, acute or chronic: actual or potential Potential alteration in comfort, pain Goals: Patient/caregiver will verbalize adequate pain control between visits Date Initiated: 12/30/2016 Target Resolution Date: 04/29/2017 Goal Status: Active Interventions: Complete pain assessment as per visit requirements Notes: ` Wound/Skin Impairment Nursing Diagnoses: Impaired tissue integrity Knowledge deficit related to smoking impact on wound healing Knowledge deficit related to ulceration/compromised skin integrity Goals: Ulcer/skin breakdown will have a volume reduction of 80% by week 12 Date Initiated: 12/30/2016 Target Resolution Date: 04/22/2017 Goal Status: Active Interventions: Assess patient/caregiver ability to perform ulcer/skin care regimen upon admission and as needed Notes: Electronic Signature(s) Signed: 07/14/2017 5:25:50 PM By: Alric Quan Entered By: Alric Quan on 07/14/2017 14:01:07 Laura [name], [name] ([id number]) -------------------------------------------------------------------------------- Pain Assessment Details Patient Name: Jeanlouis, Suheyla F. Date of Service: 07/14/2017 1:30 PM Medical Record Number: [id number] Patient Account Number: [id number] Date of Birth/Sex: 1925/05/27 (84 y.o. Female) Treating RN: Ahmed Prima Primary Care Linday Rhodes: Halina Maidens Other Clinician: Referring Karie Skowron: Halina Maidens Treating Zaylen Susman/Extender: Melburn Hake, HOYT Weeks in Treatment: 28 Active Problems Location of Pain Severity and Description of Pain Patient Has Paino No Site Locations Pain Management and Medication Current Pain Management: Electronic Signature(s) Signed: 07/14/2017 5:25:50 PM By:  Alric Quan Entered By: Alric Quan on 07/14/2017 13:57:49 Laura [name], [name]  ([id number]) -------------------------------------------------------------------------------- Patient/Caregiver Education Details Patient Name: Laura [name], Jamee F. Date of Service: 07/14/2017 1:30 PM Medical Record Number: [id number] Patient Account Number: [id number] Date of Birth/Gender: [birth year] (84 y.o. Female) Treating RN: Ahmed Prima Primary Care Physician: Halina Maidens Other Clinician: Referring Physician: Halina Maidens Treating Physician/Extender: Melburn Hake, HOYT Weeks in Treatment: 28 Education Assessment Education Provided To: Patient Education Topics Provided Wound/Skin Impairment: Handouts: Caring for Your Ulcer, Other: change dressing as ordered Methods: Demonstration, Explain/Verbal Responses: State content correctly Electronic Signature(s) Signed: 07/14/2017 5:25:50 PM By: Alric Quan Entered By: Alric Quan on 07/14/2017 14:07:08 Laura [name], [name] ([id number]) -------------------------------------------------------------------------------- Wound Assessment Details Patient Name: Laura [name], [name] Date of Service: 07/14/2017 1:30 PM Medical Record Number: [id number] Patient Account Number: [id number] Date of Birth/Sex: 01-11-1925 (84 y.o. Female) Treating RN: Carolyne Fiscal, Debi Primary Care Santasia Rew: Halina Maidens Other Clinician: Referring Taura Lamarre: Halina Maidens Treating Sadi Arave/Extender: Melburn Hake, HOYT Weeks in Treatment: 28 Wound Status Wound Number: 1 Primary Etiology: Diabetic Wound/Ulcer of the Lower Extremity Wound Location: Left Lower Leg - Lateral Wound Status: Open Wounding Event: Gradually Appeared Comorbid Type II Diabetes Date Acquired: 12/16/2016 History: Weeks Of Treatment: 28 Clustered Wound: No Photos Photo Uploaded By: Alric Quan on 07/14/2017 16:57:13 Wound Measurements Length: (cm) 2 Width: (cm) 2.5 Depth: (cm) 0.1 Area: (cm) 3.927 Volume: (cm) 0.393 % Reduction in Area: -299.9% % Reduction in Volume:  -301% Epithelialization: Small (1-33%) Tunneling: No Undermining: No Wound Description Classification: Grade 1 Wound Margin: Flat and Intact Exudate Amount: Large Exudate Type: Serous Exudate Color: amber Foul Odor After Cleansing: No Slough/Fibrino Yes Wound Bed Granulation Amount: Medium (34-66%) Exposed Structure Granulation Quality: Red Fascia Exposed: No Necrotic Amount: Medium (34-66%) Fat Layer (Subcutaneous Tissue) Exposed: Yes Necrotic Quality: Adherent Slough Tendon Exposed: No Muscle Exposed: No Joint Exposed: No Bone Exposed: No Periwound Skin Texture Laura [name], [name] ([id number]) Texture Color No Abnormalities Noted: No No Abnormalities Noted: No Callus: No Atrophie Blanche: No Crepitus: No Cyanosis: No Excoriation: No Ecchymosis: No Induration: No Erythema: Yes Rash: No Erythema Location: Circumferential Scarring: No Hemosiderin Staining: Yes Mottled: No Moisture Pallor: No No Abnormalities Noted: No Rubor: No Dry / Scaly: No Maceration: Yes Temperature / Pain Temperature: No Abnormality Tenderness on Palpation: Yes Wound Preparation Ulcer Cleansing: Rinsed/Irrigated with Saline, Other: soap and water, Topical Anesthetic Applied: Other: lidocaine 4%, Treatment Notes Wound #1 (Left, Lateral Lower Leg) 1. Cleansed with: Clean wound with Normal Saline 2. Anesthetic Topical Lidocaine 4% cream to wound bed prior to debridement 3. Peri-wound Care: Barrier cream 4. Dressing Applied: Promogran 5. Secondary Dressing Applied ABD Pad 7. Secured with Tape 3 Layer Compression System - Left Lower Extremity Notes unna to anchor, promogran, xtrasorb Electronic Signature(s) Signed: 07/14/2017 5:25:50 PM By: Alric Quan Entered By: Alric Quan on 07/14/2017 13:55:54 Laura [name], [name] ([id number]) -------------------------------------------------------------------------------- Vitals Details Patient Name: Laura [name], [name] Date of Service: 07/14/2017  1:30 PM Medical Record Number: [id number] Patient Account Number: [id number] Date of Birth/Sex: [birth year] (84 y.o. Female) Treating RN: Carolyne Fiscal, Debi Primary Care Chan Rosasco: Halina Maidens Other Clinician: Referring Krithika Tome: Halina Maidens Treating Orville Widmann/Extender: Melburn Hake, HOYT Weeks in Treatment: 28 Vital Signs Time Taken: 13:58 Temperature (F): 98.2 Height (in): 63 Pulse (bpm): 64 Weight (lbs): 164.8 Respiratory Rate (breaths/min): 18 Body Mass Index (BMI): 29.2 Blood Pressure (mmHg): 157/79 Reference Range: 80 - 120 mg / dl Electronic Signature(s) Signed: 07/14/2017 5:25:50 PM By: Alric Quan Entered By: Alric Quan on 07/14/2017 14:00:51

## 2017-07-18 NOTE — Telephone Encounter (Signed)
Stop the [place].  I will send in a similar medication that will hopefully not cause side effects.

## 2017-07-19 ENCOUNTER — Telehealth: Payer: Self-pay

## 2017-07-19 NOTE — Telephone Encounter (Signed)
Called [facility] to find out why we have not had results received for patients Urine Culture on 07-14-2017. [facility] technician stated he cannot locate that order in his system. He is unsure why, but has no order for this.

## 2017-07-21 ENCOUNTER — Telehealth: Payer: Self-pay

## 2017-07-21 ENCOUNTER — Encounter: Payer: Medicare PPO | Attending: Physician Assistant | Admitting: Physician Assistant

## 2017-07-21 DIAGNOSIS — Z7982 Long term (current) use of aspirin: Secondary | ICD-10-CM | POA: Insufficient documentation

## 2017-07-21 DIAGNOSIS — Z888 Allergy status to other drugs, medicaments and biological substances status: Secondary | ICD-10-CM | POA: Diagnosis not present

## 2017-07-21 DIAGNOSIS — E1169 Type 2 diabetes mellitus with other specified complication: Secondary | ICD-10-CM | POA: Diagnosis not present

## 2017-07-21 DIAGNOSIS — I890 Lymphedema, not elsewhere classified: Secondary | ICD-10-CM | POA: Diagnosis not present

## 2017-07-21 DIAGNOSIS — Z7984 Long term (current) use of oral hypoglycemic drugs: Secondary | ICD-10-CM | POA: Diagnosis not present

## 2017-07-21 DIAGNOSIS — Z880 Allergy status to penicillin: Secondary | ICD-10-CM | POA: Insufficient documentation

## 2017-07-21 DIAGNOSIS — M869 Osteomyelitis, unspecified: Secondary | ICD-10-CM | POA: Diagnosis not present

## 2017-07-21 DIAGNOSIS — Z882 Allergy status to sulfonamides status: Secondary | ICD-10-CM | POA: Diagnosis not present

## 2017-07-21 DIAGNOSIS — I10 Essential (primary) hypertension: Secondary | ICD-10-CM | POA: Insufficient documentation

## 2017-07-21 DIAGNOSIS — Z87891 Personal history of nicotine dependence: Secondary | ICD-10-CM | POA: Insufficient documentation

## 2017-07-21 DIAGNOSIS — L97822 Non-pressure chronic ulcer of other part of left lower leg with fat layer exposed: Secondary | ICD-10-CM | POA: Diagnosis not present

## 2017-07-21 DIAGNOSIS — E11622 Type 2 diabetes mellitus with other skin ulcer: Secondary | ICD-10-CM | POA: Diagnosis not present

## 2017-07-21 DIAGNOSIS — Z79899 Other long term (current) drug therapy: Secondary | ICD-10-CM | POA: Diagnosis not present

## 2017-07-21 DIAGNOSIS — L03116 Cellulitis of left lower limb: Secondary | ICD-10-CM | POA: Insufficient documentation

## 2017-07-21 DIAGNOSIS — Z881 Allergy status to other antibiotic agents status: Secondary | ICD-10-CM | POA: Diagnosis not present

## 2017-07-21 NOTE — Telephone Encounter (Signed)
Patient called stating she is on 2nd antibiotic and has still had diarrhea. Has had this for 5 days straight now. Called her back and left her VM of Dr Gaspar Cola instructions to STOP antibiotics completely and begin OTC imodium to help with diarrhea. Drink plenty of fluids so she does not become hydrated.  Told her to call if anymore questions.

## 2017-07-23 NOTE — Progress Notes (Signed)
TUCKETT, Torri F. ([id number]) Visit Report for 07/21/2017 Arrival Information Details Patient Name: Laura [name], [name] Date of Service: 07/21/2017 2:15 PM Medical Record Number: [id number] Patient Account Number: [id number] Date of Birth/Sex: 12/23/1924 (84 y.o. Female) Treating RN: Carolyne Fiscal, Debi Primary Care Tesla Keeler: Halina Maidens Other Clinician: Referring Shya Kovatch: Halina Maidens Treating Dayanara Sherrill/Extender: Melburn Hake, HOYT Weeks in Treatment: 84 Visit Information History Since Last Visit All ordered tests and consults were completed: No Patient Arrived: Kasandra Knudsen Added or deleted any medications: No Arrival Time: 14:24 Any new allergies or adverse reactions: No Accompanied By: friend Had a fall or experienced change in No Transfer Assistance: EasyPivot Patient activities of daily living that may affect Lift risk of falls: Patient Identification Verified: Yes Signs or symptoms of abuse/neglect since last visito No Secondary Verification Process Yes Hospitalized since last visit: No Completed: Has Dressing in Place as Prescribed: Yes Patient Requires Transmission-Based No Precautions: Pain Present Now: No Patient Has Alerts: Yes Patient Alerts: DM II Electronic Signature(s) Signed: 07/21/2017 4:17:47 PM By: Alric Quan Entered By: Alric Quan on 07/21/2017 14:24:51 Wogan, Shantese F. ([id number]) -------------------------------------------------------------------------------- Encounter Discharge Information Details Patient Name: Kozlov, Talula F. Date of Service: 07/21/2017 2:15 PM Medical Record Number: [id number] Patient Account Number: [id number] Date of Birth/Sex: Apr 18, 1925 (84 y.o. Female) Treating RN: Carolyne Fiscal, Debi Primary Care Ulyess Muto: Halina Maidens Other Clinician: Referring Tolbert Matheson: Halina Maidens Treating Ebelin Dillehay/Extender: Melburn Hake, HOYT Weeks in Treatment: 24 Encounter Discharge Information Items Discharge Pain Level: 0 Discharge Condition: Stable Ambulatory Status:  Cane Discharge Destination: Home Transportation: Private Auto Accompanied By: friend Schedule Follow-up Appointment: Yes Medication Reconciliation completed and No provided to Patient/Care Christoph Copelan: Provided on Clinical Summary of Care: 07/21/2017 Form Type Recipient Paper Patient [facility] Electronic Signature(s) Signed: 07/21/2017 4:27:15 PM By: Ruthine Dose Entered By: Ruthine Dose on 07/21/2017 15:11:22 Whiteside, Elizabet F. ([id number]) -------------------------------------------------------------------------------- Lower Extremity Assessment Details Patient Name: Feigenbaum, Dalisa F. Date of Service: 07/21/2017 2:15 PM Medical Record Number: [id number] Patient Account Number: [id number] Date of Birth/Sex: 10-23-1924 (84 y.o. Female) Treating RN: Carolyne Fiscal, Debi Primary Care Caelen Reierson: Halina Maidens Other Clinician: Referring Jarin Cornfield: Halina Maidens Treating Novis League/Extender: Melburn Hake, HOYT Weeks in Treatment: 29 Edema Assessment Assessed: [Left: No] [Right: No] [Left: Edema] [Right: :] Calf Left: Right: Point of Measurement: 34 cm From Medial Instep 33.2 cm cm Ankle Left: Right: Point of Measurement: 10 cm From Medial Instep 22 cm cm Vascular Assessment Pulses: Dorsalis Pedis Palpable: [Left:Yes] Posterior Tibial Extremity colors, hair growth, and conditions: Extremity Color: [Left:Red] Temperature of Extremity: [Left:Warm] Capillary Refill: [Left:< 3 seconds] Toe Nail Assessment Left: Right: Thick: No Discolored: No Deformed: No Improper Length and Hygiene: No Electronic Signature(s) Signed: 07/21/2017 4:17:47 PM By: Alric Quan Entered By: Alric Quan on 07/21/2017 14:33:27 Buss, Amamda F. ([id number]) -------------------------------------------------------------------------------- Multi Wound Chart Details Patient Name: Gorton, Cherish F. Date of Service: 07/21/2017 2:15 PM Medical Record Number: [id number] Patient Account Number: [id number] Date of Birth/Sex: 01-25-1925 (84  y.o. Female) Treating RN: Carolyne Fiscal, Debi Primary Care Zinnia Tindall: Halina Maidens Other Clinician: Referring Jermya Dowding: Halina Maidens Treating Merrie Epler/Extender: Melburn Hake, HOYT Weeks in Treatment: 29 Vital Signs Height(in): 6 Pulse(bpm): 20 Weight(lbs): 164.8 Blood Pressure(mmHg): 133/69 Body Mass Index(BMI): 29 Temperature(F): 98.2 Respiratory Rate 18 (breaths/min): Photos: [1:No Photos] [N/A:N/A] Wound Location: [1:Left Lower Leg - Lateral] [N/A:N/A] Wounding Event: [1:Gradually Appeared] [N/A:N/A] Primary Etiology: [1:Diabetic Wound/Ulcer of the Lower Extremity] [N/A:N/A] Comorbid History: [1:Type II Diabetes] [N/A:N/A] Date Acquired: [1:12/16/2016] [N/A:N/A] Weeks of Treatment: [1:29] [N/A:N/A] Wound Status: [1:Open] [N/A:N/A] Measurements L x W x D [1:2x2.5x0.1] [N/A:N/A] (cm)  Area (cm) : [1:3.927] [N/A:N/A] Volume (cm) : [1:0.393] [N/A:N/A] % Reduction in Area: [1:-299.90%] [N/A:N/A] % Reduction in Volume: [1:-301.00%] [N/A:N/A] Classification: [1:Grade 1] [N/A:N/A] Exudate Amount: [1:Large] [N/A:N/A] Exudate Type: [1:Serous] [N/A:N/A] Exudate Color: [1:amber] [N/A:N/A] Wound Margin: [1:Flat and Intact] [N/A:N/A] Granulation Amount: [1:Medium (34-66%)] [N/A:N/A] Granulation Quality: [1:Red] [N/A:N/A] Necrotic Amount: [1:Medium (34-66%)] [N/A:N/A] Exposed Structures: [1:Fat Layer (Subcutaneous Tissue) Exposed: Yes Fascia: No Tendon: No Muscle: No Joint: No Bone: No] [N/A:N/A] Epithelialization: [1:Small (1-33%)] [N/A:N/A] Periwound Skin Texture: [1:Excoriation: No Induration: No Callus: No Crepitus: No Rash: No Scarring: No] [N/A:N/A] Periwound Skin Moisture: Maceration: Yes N/A N/A Dry/Scaly: No Periwound Skin Color: Erythema: Yes N/A N/A Hemosiderin Staining: Yes Atrophie Blanche: No Cyanosis: No Ecchymosis: No Mottled: No Pallor: No Rubor: No Erythema Location: Circumferential N/A N/A Temperature: No Abnormality N/A N/A Tenderness on Palpation:  Yes N/A N/A Wound Preparation: Ulcer Cleansing: N/A N/A Rinsed/Irrigated with Saline, Other: soap and water Topical Anesthetic Applied: Other: lidocaine 4% Treatment Notes Electronic Signature(s) Signed: 07/21/2017 4:17:47 PM By: Alric Quan Entered By: Alric Quan on 07/21/2017 14:39:54 Puccini, Leylanie F. ([id number]) -------------------------------------------------------------------------------- Multi-Disciplinary Care Plan Details Patient Name: [name], Brennyn F. Date of Service: 07/21/2017 2:15 PM Medical Record Number: [id number] Patient Account Number: [id number] Date of Birth/Sex: 1924/11/09 (84 y.o. Female) Treating RN: Carolyne Fiscal, Debi Primary Care Shalayna Ornstein: Halina Maidens Other Clinician: Referring Tayen Narang: Halina Maidens Treating Meagen Limones/Extender: Melburn Hake, HOYT Weeks in Treatment: 73 Active Inactive ` Abuse / Safety / Falls / Self Care Management Nursing Diagnoses: Potential for falls Goals: Patient will not experience any injury related to falls Date Initiated: 12/30/2016 Target Resolution Date: 04/29/2017 Goal Status: Active Interventions: Assess Activities of Daily Living upon admission and as needed Assess: immobility, friction, shearing, incontinence upon admission and as needed Notes: ` Nutrition Nursing Diagnoses: Imbalanced nutrition Impaired glucose control: actual or potential Potential for alteratiion in Nutrition/Potential for imbalanced nutrition Goals: Patient/caregiver will maintain therapeutic glucose control Date Initiated: 12/30/2016 Target Resolution Date: 04/29/2017 Goal Status: Active Interventions: Assess patient nutrition upon admission and as needed per policy Notes: ` Orientation to the Wound Care Program Nursing Diagnoses: Knowledge deficit related to the wound healing center program Goals: Patient/caregiver will verbalize understanding of the [facility] Date Initiated: 12/30/2016 Target Resolution Date:  01/21/2017 Zappulla, Kelee Wanda Plump ([id number]) Goal Status: Active Interventions: Provide education on orientation to the wound center Notes: ` Pain, Acute or Chronic Nursing Diagnoses: Pain, acute or chronic: actual or potential Potential alteration in comfort, pain Goals: Patient/caregiver will verbalize adequate pain control between visits Date Initiated: 12/30/2016 Target Resolution Date: 04/29/2017 Goal Status: Active Interventions: Complete pain assessment as per visit requirements Notes: ` Wound/Skin Impairment Nursing Diagnoses: Impaired tissue integrity Knowledge deficit related to smoking impact on wound healing Knowledge deficit related to ulceration/compromised skin integrity Goals: Ulcer/skin breakdown will have a volume reduction of 80% by week 12 Date Initiated: 12/30/2016 Target Resolution Date: 04/22/2017 Goal Status: Active Interventions: Assess patient/caregiver ability to perform ulcer/skin care regimen upon admission and as needed Notes: Electronic Signature(s) Signed: 07/21/2017 4:17:47 PM By: Alric Quan Entered By: Alric Quan on 07/21/2017 14:39:21 Dobos, Jalisa F. ([id number]) -------------------------------------------------------------------------------- Pain Assessment Details Patient Name: Mull, Jannette F. Date of Service: 07/21/2017 2:15 PM Medical Record Number: [id number] Patient Account Number: [id number] Date of Birth/Sex: Jan 27, 1925 (84 y.o. Female) Treating RN: Ahmed Prima Primary Care Armoni Kludt: Halina Maidens Other Clinician: Referring Madisun Hargrove: Halina Maidens Treating Sadie Pickar/Extender: Melburn Hake, HOYT Weeks in Treatment: 29 Active Problems Location of Pain Severity and Description of Pain Patient Has Paino  No Site Locations Pain Management and Medication Current Pain Management: Electronic Signature(s) Signed: 07/21/2017 4:17:47 PM By: Alric Quan Entered By: Alric Quan on 07/21/2017 14:24:57 Digeronimo, Talicia F.  ([id number]) -------------------------------------------------------------------------------- Patient/Caregiver Education Details Patient Name: [name], Aryonna F. Date of Service: 07/21/2017 2:15 PM Medical Record Number: [id number] Patient Account Number: [id number] Date of Birth/Gender: 01-25-1925 (84 y.o. Female) Treating RN: Ahmed Prima Primary Care Physician: Halina Maidens Other Clinician: Referring Physician: Halina Maidens Treating Physician/Extender: Melburn Hake, HOYT Weeks in Treatment: 25 Education Assessment Education Provided To: Patient Education Topics Provided Wound/Skin Impairment: Handouts: Caring for Your Ulcer, Other: change dressing as ordered Methods: Demonstration, Explain/Verbal Responses: State content correctly Electronic Signature(s) Signed: 07/21/2017 4:17:47 PM By: Alric Quan Entered By: Alric Quan on 07/21/2017 14:41:34 Macfarlane, Kima F. ([id number]) -------------------------------------------------------------------------------- Wound Assessment Details Patient Name: Mcdowell, Cyntha F. Date of Service: 07/21/2017 2:15 PM Medical Record Number: [id number] Patient Account Number: [id number] Date of Birth/Sex: Nov 14, 1924 (84 y.o. Female) Treating RN: Carolyne Fiscal, Debi Primary Care Tannor Pyon: Halina Maidens Other Clinician: Referring Lacharles Altschuler: Halina Maidens Treating Prathik Aman/Extender: Melburn Hake, HOYT Weeks in Treatment: 29 Wound Status Wound Number: 1 Primary Etiology: Diabetic Wound/Ulcer of the Lower Extremity Wound Location: Left Lower Leg - Lateral Wound Status: Open Wounding Event: Gradually Appeared Comorbid Type II Diabetes Date Acquired: 12/16/2016 History: Weeks Of Treatment: 29 Clustered Wound: No Photos Photo Uploaded By: Alric Quan on 07/21/2017 16:25:06 Wound Measurements Length: (cm) 2 Width: (cm) 2.5 Depth: (cm) 0.1 Area: (cm) 3.927 Volume: (cm) 0.393 % Reduction in Area: -299.9% % Reduction in Volume:  -301% Epithelialization: Small (1-33%) Tunneling: No Undermining: No Wound Description Classification: Grade 1 Wound Margin: Flat and Intact Exudate Amount: Large Exudate Type: Serous Exudate Color: amber Foul Odor After Cleansing: No Slough/Fibrino Yes Wound Bed Granulation Amount: Medium (34-66%) Exposed Structure Granulation Quality: Red Fascia Exposed: No Necrotic Amount: Medium (34-66%) Fat Layer (Subcutaneous Tissue) Exposed: Yes Necrotic Quality: Adherent Slough Tendon Exposed: No Muscle Exposed: No Joint Exposed: No Bone Exposed: No Periwound Skin Texture Coffelt, Yoanna F. ([id number]) Texture Color No Abnormalities Noted: No No Abnormalities Noted: No Callus: No Atrophie Blanche: No Crepitus: No Cyanosis: No Excoriation: No Ecchymosis: No Induration: No Erythema: Yes Rash: No Erythema Location: Circumferential Scarring: No Hemosiderin Staining: Yes Mottled: No Moisture Pallor: No No Abnormalities Noted: No Rubor: No Dry / Scaly: No Maceration: Yes Temperature / Pain Temperature: No Abnormality Tenderness on Palpation: Yes Wound Preparation Ulcer Cleansing: Rinsed/Irrigated with Saline, Other: soap and water, Topical Anesthetic Applied: Other: lidocaine 4%, Treatment Notes Wound #1 (Left, Lateral Lower Leg) 1. Cleansed with: Clean wound with Normal Saline 2. Anesthetic Topical Lidocaine 4% cream to wound bed prior to debridement 3. Peri-wound Care: Barrier cream 4. Dressing Applied: Promogran 5. Secondary Dressing Applied ABD Pad Dry Gauze 7. Secured with Tape 3 Layer Compression System - Left Lower Extremity Notes unna to anchor, promogran, xtrasorb Electronic Signature(s) Signed: 07/21/2017 4:17:47 PM By: Alric Quan Entered By: Alric Quan on 07/21/2017 14:30:23 Sweetin, Necole F. ([id number]) -------------------------------------------------------------------------------- Vitals Details Patient Name: Wyly, Kazi F. Date of Service:  07/21/2017 2:15 PM Medical Record Number: [id number] Patient Account Number: [id number] Date of Birth/Sex: May 02, 1925 (84 y.o. Female) Treating RN: Carolyne Fiscal, Debi Primary Care Quantay Zaremba: Halina Maidens Other Clinician: Referring Thyra Yinger: Halina Maidens Treating Jaedin Regina/Extender: Melburn Hake, HOYT Weeks in Treatment: 29 Vital Signs Time Taken: 14:24 Temperature (F): 98.2 Height (in): 63 Pulse (bpm): 76 Weight (lbs): 164.8 Respiratory Rate (breaths/min): 18 Body Mass Index (BMI): 29.2 Blood Pressure (mmHg): 133/69 Reference Range: 80 - 120 mg /  dl Electronic Signature(s) Signed: 07/21/2017 4:17:47 PM By: Alric Quan Entered By: Alric Quan on 07/21/2017 14:34:16

## 2017-07-23 NOTE — Progress Notes (Signed)
[name], [name] ([id number]) Visit Report for 07/21/2017 Chief Complaint Document Details Patient Name: Laura [name], [name] Date of Service: 07/21/2017 2:15 PM Medical Record Number: [id number] Patient Account Number: [id number] Date of Birth/Sex: 22-Apr-1925 (84 y.o. Female) Treating RN: Ahmed Prima Primary Care Provider: Halina Maidens Other Clinician: Referring Provider: Halina Maidens Treating Provider/Extender: Melburn Hake, Evonna Stoltz Weeks in Treatment: 29 Information Obtained from: Patient Chief Complaint Left lower leg cellulitis Electronic Signature(s) Signed: 07/21/2017 4:48:53 PM By: Worthy Keeler PA-C Entered By: Worthy Keeler on 07/21/2017 14:36:49 [name], [name] ([id number]) -------------------------------------------------------------------------------- Debridement Details Patient Name: Laura [name], [name] Date of Service: 07/21/2017 2:15 PM Medical Record Number: [id number] Patient Account Number: [id number] Date of Birth/Sex: 04/01/1925 (84 y.o. Female) Treating RN: Carolyne Fiscal, Debi Primary Care Provider: Halina Maidens Other Clinician: Referring Provider: Halina Maidens Treating Provider/Extender: Melburn Hake, Essica Kiker Weeks in Treatment: 29 Debridement Performed for Wound #1 Left,Lateral Lower Leg Assessment: Performed By: Physician STONE III, Ema Hebner E., PA-C Debridement: Debridement Severity of Tissue Pre Fat layer exposed Debridement: Pre-procedure Verification/Time Yes - 14:48 Out Taken: Start Time: 14:49 Pain Control: Lidocaine 4% Topical Solution Level: Skin/Subcutaneous Tissue Total Area Debrided (L x W): 2 (cm) x 2.5 (cm) = 5 (cm) Tissue and other material Viable, Non-Viable, Exudate, Fibrin/Slough, Subcutaneous debrided: Instrument: Curette Bleeding: Minimum Hemostasis Achieved: Pressure End Time: 14:54 Procedural Pain: 0 Post Procedural Pain: 0 Response to Treatment: Procedure was tolerated well Post Debridement Measurements of Total Wound Length: (cm) 2 Width: (cm)  2.5 Depth: (cm) 0.2 Volume: (cm) 0.785 Character of Wound/Ulcer Post Debridement: Requires Further Debridement Severity of Tissue Post Debridement: Fat layer exposed Post Procedure Diagnosis Same as Pre-procedure Electronic Signature(s) Signed: 07/21/2017 4:17:47 PM By: Alric Quan Signed: 07/21/2017 4:48:53 PM By: Worthy Keeler PA-C Entered By: Alric Quan on 07/21/2017 14:53:50 Laura [name], [name] ([id number]) -------------------------------------------------------------------------------- HPI Details [name], [name] Date of Service: 07/21/2017 2:15 PM Medical Record Number: [id number] Patient Account Number: [id number] Date of Birth/Sex: 02-18-1925 (84 y.o. Female) Treating RN: Carolyne Fiscal, Debi Primary Care Provider: Halina Maidens Other Clinician: Referring Provider: Halina Maidens Treating Provider/Extender: Melburn Hake, Rosevelt Luu Weeks in Treatment: 12 History of Present Illness HPI Description: 12/30/16 Patient presents today for initial evaluation concerning an area over her left lateral ankle which she tells me has been intermittent in nature since 2000. However the current opening has been present for about two weeks. She has been tolerating the dressing changes at home with antibiotic ointment but that is really the only treatment that has been initiated at this point. That is other than the oral antibiotics which was Keflex that patient was placed on by her primary care provider prior to being referred to [place]. She does have some discomfort but rates this to be around a 2-3 out of 10 fortunately the redness does not seem to be spreading to any other location as far as her lower extremity is concerned. She does tell me that in the past antibiotics have been beneficial for her unfortunately the current antibiotics have not been of benefit and no wound culture was performed as of yet. She has previously seen Dr. 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 She does have diabetes and are most recent blood sugars have run between 104 and 106 according to patient's although her last hemoglobin A1c she is unaware of.  Patient's current wound does not appear to be significantly open but is rather more of a weeping region of cellulitis. 01/27/17 on evaluation today patient left lower for many wound continues to show signs of erythema surrounding and in fact she is noted to have erythema from her toes up to just below the knee in regard to left lower extremity. She is not having any fevers, chills, nausea, vomiting, or diarrhea at this point. With that being said she is concerned about the silver alginate dressing. She tells me that in the past she used silver and some of the dressings for previous one and did not do well with it. Nonetheless she has discomfort along the wound itself but no other pain noted throughout the left lower extremity. 02/03/17 on evaluation today patient appears to be doing fairly well overall other than the fact that her wound is worsening on the lateral aspect of her left lower extremity. It appears more macerated and even slough covered at this point. With that being said she has been attempting to perform the dressing changes on her own although I'm not sure that she is understanding exactly how this should be done. Nonetheless I do believe the gentamicin may be causing too much maceration she really has not wanted to utilize the silver alginate dressings past due to a previous issue with this. Overall her erythema of the left lower extremity is improved she still has significant swelling. No fevers, chills, nausea, or vomiting noted at this time. 02/10/2017 -- Old notes received -- was seen in 2009 by [facility] by Dr. Romie Levee -- his impression was chronic patches of  dermatitis on the left lower extremity and current mild swelling. He recommended venous insufficiency venous reflux studies. She had no evidence of arterial insufficiency. Follow-up chronic venous insufficiency study done on 12/16/2007, showed very mild amount of reflux on the great saphenous vein at the knee but the function of the vein is normal and there was no evidence by the venous reflux study.. They referred her to dermatology for a second opinion and would see her back in about 4-6 weeks. She was seen back in follow-up in January 01 2008 and at that time she was asked to use a steroid ointment locally and the wounds are almost completely healed. The patient has told me today that she does not tolerate silver dressing -- and she has not had home health come and change her dressings over the last week. 02/17/2017 -- - had a lower arterial study done which showed a bilateral ABI suggests no significant lower extremity arterial disease and the toe brachial indicis were normal on the right and abnormal on the left. The right ABI was 1.0 and the left was 1.07. The right toe brachial indices was 0.81 on the right and 0.56 on the left. She had biphasic and triphasic flow at the tibial vessels. 02/24/2017 -- the patient has been tolerating a 3 layer wrap and seems to be pleased with her progress 03/17/17 on evaluation today patient appears to be doing better in regard to her left lateral lower extremity wound. She has been Tolerating the wraps without complication. No fevers, chills, nausea, or vomiting noted at this time. She is pleased with Cress, Zasha F. ([id number]) how things are progressing. Early complaint is that she is unable to take a shower for such a long time from Wednesday all the way till the following Monday. 07/14/17 on evaluation today patient appears to be doing decently well  in regard to her left lateral lower extremity ulcer. She has been tolerating the dressing changes without  complication. With that being said she does have more irritation noted today compared to last week as far as redness surrounding and up the interior portion of her shin. I do not believe this is pressure at this time. I likely think this is more of a cellulitis type issue although fortunately she does not seem to have any significant discomfort. In these compared to previous. She does have slough overlying the wound bed but otherwise it appears to be doing well. 07/21/17 on evaluation today patient appears to be doing better in regard to her left lateral lower extremity ulcer. She has been tolerating the dressing changes without complication unfortunately she has not been tolerating the antibiotics that I prescribed for her last week. Subsequently she was switch to a different antibiotic by her primary care provider due to a urinary tract infection as well unfortunately she also did not tolerate this antibiotic either. Both calls the diarrhea. The only thing that she knows of that she's done well with in the past is Keflex and amoxicillin alone not Augmentin. Fortunately she has no worsening symptoms and in fact the redness and erythema surrounding her wound on the left lateral lower extremity appears to be doing better. Electronic Signature(s) Signed: 07/21/2017 4:48:53 PM By: Worthy Keeler PA-C Entered By: Worthy Keeler on 07/21/2017 16:34:21 Laura [name], [name] ([id number]) -------------------------------------------------------------------------------- Physical Exam Details Patient Name: Laura [name], [name] Date of Service: 07/21/2017 2:15 PM Medical Record Number: [id number] Patient Account Number: [id number] Date of Birth/Sex: 1925/03/14 (84 y.o. Female) Treating RN: Ahmed Prima Primary Care Provider: Halina Maidens Other Clinician: Referring Provider: Halina Maidens Treating Provider/Extender: STONE III, Daenerys Buttram Weeks in Treatment: 71 Constitutional Well-nourished and well-hydrated in no acute  distress. Respiratory normal breathing without difficulty. Psychiatric this patient is able to make decisions and demonstrates good insight into disease process. Alert and Oriented x 3. pleasant and cooperative. Notes At this point patient does have some Slough noted over the surface of the wound fortunately this was able to be sharply debride it fairly well without complication today she tolerated this with no significant discomfort. Electronic Signature(s) Signed: 07/21/2017 4:48:53 PM By: Worthy Keeler PA-C Entered By: Worthy Keeler on 07/21/2017 16:35:16 Gerads, Jaylee F. ([id number]) -------------------------------------------------------------------------------- Physician Orders Details Patient Name: Laura [name], [name] Date of Service: 07/21/2017 2:15 PM Medical Record Number: [id number] Patient Account Number: [id number] Date of Birth/Sex: 1925/06/12 (84 y.o. Female) Treating RN: Carolyne Fiscal, Debi Primary Care Provider: Halina Maidens Other Clinician: Referring Provider: Halina Maidens Treating Provider/Extender: Melburn Hake, Shaylee Stanislawski Weeks in Treatment: 86 Verbal / Phone Orders: Yes Clinician: Carolyne Fiscal, Debi Read Back and Verified: Yes Diagnosis Coding ICD-10 Coding Code Description E11.622 Type 2 diabetes mellitus with other skin ulcer I10 Essential (primary) hypertension I89.0 Lymphedema, not elsewhere classified L97.822 Non-pressure chronic ulcer of other part of left lower leg with fat layer exposed Wound Cleansing Wound #1 Left,Lateral Lower Leg o Clean wound with wound cleanser. o Cleanse wound with mild soap and water o May Shower, gently pat wound dry prior to applying new dressing. o Other: - pt may take wrap off and shower before nurse comes or before she comes to clinic Anesthetic (add to Medication List) Wound #1 Left,Lateral Lower Leg o Topical Lidocaine 4% cream applied to wound bed prior to debridement (In Clinic Only). Skin Barriers/Peri-Wound Care Wound #1  Left,Lateral Lower Leg o Barrier cream Primary Wound Dressing Wound #1  Left,Lateral Lower Leg o Promogran - cut the Promogran and place only on wound bed and moisten with normal saline Secondary Dressing Wound #1 Left,Lateral Lower Leg o ABD pad o Dry Gauze o XtraSorb Dressing Change Frequency o Change Dressing Monday, Wednesday, Friday Follow-up Appointments o Return Appointment in 1 week. Edema Control Wound #1 Left,Lateral Lower Leg Laura [name], [name] ([id number]) o 3 Layer Compression System - Left Lower Extremity - Please wrap 3cm from toes and 3cm from knee. unna to anchor *****DO NOT USE THE COTTON LAYER, PLEASE USE KERLIX INSTEAD***** o Elevate legs to the level of the heart and pump ankles as often as possible Additional Orders / Instructions Wound #1 Left,Lateral Lower Leg o Increase protein intake. Home Health Wound #1 [place] Nurse may visit PRN to address patientos wound care needs. o FACE TO FACE ENCOUNTER: MEDICARE and MEDICAID PATIENTS: I certify that this patient is under my care and that I had a face-to-face encounter that meets the physician face-to-face encounter requirements with this patient on this date. The encounter with the patient was in whole or in part for the following MEDICAL CONDITION: (primary reason for [place]) MEDICAL NECESSITY: I certify, that based on my findings, NURSING services are a medically necessary home health service. HOME BOUND STATUS: I certify that my clinical findings support that this patient is homebound (i.e., Due to illness or injury, pt requires aid of supportive devices such as crutches, cane, wheelchairs, walkers, the use of special transportation or the assistance of another person to leave their place of residence. There is a normal inability to leave the home and doing so requires considerable and taxing effort. Other absences are for  medical reasons / religious services and are infrequent or of short duration when for other reasons). o If current dressing causes regression in wound condition, may D/C ordered dressing product/s and apply Normal Saline Moist Dressing daily until next [place] / Other MD appointment. [place] of regression in wound condition at [phone]. o Please direct any NON-WOUND related issues/requests for orders to patient's Primary Care Physician Medications-please add to medication list. Wound #1 Left,Lateral Lower Leg o Other: - Vitamin A, Vitamin C, Zinc, MVI Patient Medications Allergies: Augmentin, calcium channel blockers, nitrofurantoin, ACE Inhibitors, atorvastatin, Beta-Blockers (Beta-Adrenergic Blocking Agts), Levaquin, pravastatin, Statins-Hmg-Coa Reductase Inhibitors, sulfa, clindamycin, lincomycin, SILVER, doxycycline Notifications Medication Indication Start End lidocaine DOSE 1 - topical 4 % cream - 1 cream topical Electronic Signature(s) Signed: 07/21/2017 4:17:47 PM By: Alric Quan Signed: 07/21/2017 4:48:53 PM By: Worthy Keeler PA-C Entered By: Alric Quan on 07/21/2017 16:06:48 Spilde, Madisynn F. ([id number]) -------------------------------------------------------------------------------- Prescription 07/21/2017 Patient Name: Laura [name], Aldena F. Provider: Worthy Keeler PA-C Date of Birth: 1925-06-19 NPI#: [id number] Sex: F DEA#: [id number] Phone #: [phone] License #: Patient Address: [facility] [place], [place] [address] [address] [place], [place] [address] [phone] Allergies Augmentin calcium channel blockers nitrofurantoin ACE Inhibitors atorvastatin Beta-Blockers (Beta-Adrenergic Blocking Agts) Levaquin pravastatin Statins-Hmg-Coa Reductase Inhibitors sulfa clindamycin lincomycin SILVER doxycycline Medication Vasco, Elonda  F. ([id number]) Medication: Route: Strength: Form: lidocaine 4 % topical cream topical 4% cream Class: TOPICAL LOCAL ANESTHETICS Dose: Frequency / Time: Indication: 1 1 cream topical Number of Refills: Number of Units: 0 Generic Substitution: Start Date: End Date: One Time Use: Substitution Permitted No Note to Pharmacy: Signature(s): Date(s): Electronic Signature(s) Signed: 07/21/2017 4:17:47 PM By:  Alric Quan Signed: 07/21/2017 4:48:53 PM By: Worthy Keeler PA-C Entered By: Alric Quan on 07/21/2017 16:06:48 Obando, Paiden F. ([id number]) --------------------------------------------------------------------------------  Problem List Details Patient Name: Laura [name], [name] Date of Service: 07/21/2017 2:15 PM Medical Record Number: [id number] Patient Account Number: [id number] Date of Birth/Sex: June 26, 1924 (84 y.o. Female) Treating RN: Carolyne Fiscal, Debi Primary Care Provider: Halina Maidens Other Clinician: Referring Provider: Halina Maidens Treating Provider/Extender: Melburn Hake, Juliane Guest Weeks in Treatment: 29 Active Problems ICD-10 Encounter Code Description Active Date Diagnosis E11.622 Type 2 diabetes mellitus with other skin ulcer 01/02/2017 Yes I10 Essential (primary) hypertension 01/02/2017 Yes I89.0 Lymphedema, not elsewhere classified 01/16/2017 Yes L97.822 Non-pressure chronic ulcer of other part of left lower leg with fat 06/29/2017 Yes layer exposed Inactive Problems Resolved Problems ICD-10 Code Description Active Date Resolved Date L03.116 Cellulitis of left lower limb 01/02/2017 01/02/2017 Electronic Signature(s) Signed: 07/21/2017 4:48:53 PM By: Worthy Keeler PA-C Entered By: Worthy Keeler on 07/21/2017 14:36:39 Okane, Nataly F. ([id number]) -------------------------------------------------------------------------------- Progress Note Details Patient Name: Laura [name], [name] Date of Service: 07/21/2017 2:15 PM Medical Record Number: [id number] Patient Account Number:  [id number] Date of Birth/Sex: 1924-07-15 (84 y.o. Female) Treating RN: Carolyne Fiscal, Debi Primary Care Provider: Halina Maidens Other Clinician: Referring Provider: Halina Maidens Treating Provider/Extender: Melburn Hake, Mathew Postiglione Weeks in Treatment: 29 Subjective Chief Complaint Information obtained from Patient Left lower leg cellulitis History of Present Illness (HPI) 12/30/16 Patient presents today for initial evaluation concerning an area over her left lateral ankle which she tells me has been intermittent in nature since 2000. However the current opening has been present for about two weeks. She has been tolerating the dressing changes at home with antibiotic ointment but that is really the only treatment that has been initiated at this point. That is other than the oral antibiotics which was Keflex that patient was placed on by her primary care provider prior to being referred to [place]. She does have some discomfort but rates this to be around a 2-3 out of 10 fortunately the redness does not seem to be spreading to any other location as far as her lower extremity is concerned. She does tell me that in the past antibiotics have been beneficial for her unfortunately the current antibiotics have not been of benefit and no wound culture was performed as of yet. She has previously seen Dr. 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 She does have diabetes and are most recent blood sugars have run between 104 and 106 according to patient's although her last hemoglobin A1c she is unaware of. Patient's current wound does not appear to be significantly open but is rather more of a weeping region of cellulitis. 01/27/17 on evaluation today patient left lower for many wound continues to show signs of erythema surrounding and in fact she  is noted to have erythema from her toes up to just below the knee in regard to left lower extremity. She is not having any fevers, chills, nausea, vomiting, or diarrhea at this point. With that being said she is concerned about the silver alginate dressing. She tells me that in the past she used silver and some of the dressings for previous one and did not do well with it. Nonetheless she has discomfort along the wound itself but no other pain noted throughout the left lower extremity. 02/03/17  on evaluation today patient appears to be doing fairly well overall other than the fact that her wound is worsening on the lateral aspect of her left lower extremity. It appears more macerated and even slough covered at this point. With that being said she has been attempting to perform the dressing changes on her own although I'm not sure that she is understanding exactly how this should be done. Nonetheless I do believe the gentamicin may be causing too much maceration she really has not wanted to utilize the silver alginate dressings past due to a previous issue with this. Overall her erythema of the left lower extremity is improved she still has significant swelling. No fevers, chills, nausea, or vomiting noted at this time. 02/10/2017 -- Old notes received -- was seen in 2009 by [facility] by Dr. Romie Levee -- his impression was chronic patches of dermatitis on the left lower extremity and current mild swelling. He recommended venous insufficiency venous reflux studies. She had no evidence of arterial insufficiency. Follow-up chronic venous insufficiency study done on 12/16/2007, showed very mild amount of reflux on the great saphenous vein at the knee but the function of the vein is normal and there was no evidence by the venous reflux study.. They referred her to dermatology for a second opinion and would see her back in about 4-6 weeks. She was seen back in follow-up in January 01 2008 and at that  time she was asked to use a steroid ointment locally and the wounds are almost completely healed. The patient has told me today that she does not tolerate silver dressing -- and she has not had home health come and change her dressings over the last week. 02/17/2017 -- - had a lower arterial study done which showed a bilateral ABI suggests no significant lower extremity arterial disease and the toe brachial indicis were normal on the right and abnormal on the left. The right ABI was 1.0 and the left was 1.07. The right toe brachial indices was 0.81 on the right and 0.56 on the left. She had biphasic and triphasic flow at the tibial Laura [name], [name] ([id number]) vessels. 02/24/2017 -- the patient has been tolerating a 3 layer wrap and seems to be pleased with her progress 03/17/17 on evaluation today patient appears to be doing better in regard to her left lateral lower extremity wound. She has been Tolerating the wraps without complication. No fevers, chills, nausea, or vomiting noted at this time. She is pleased with how things are progressing. Early complaint is that she is unable to take a shower for such a long time from Wednesday all the way till the following Monday. 07/14/17 on evaluation today patient appears to be doing decently well in regard to her left lateral lower extremity ulcer. She has been tolerating the dressing changes without complication. With that being said she does have more irritation noted today compared to last week as far as redness surrounding and up the interior portion of her shin. I do not believe this is pressure at this time. I likely think this is more of a cellulitis type issue although fortunately she does not seem to have any significant discomfort. In these compared to previous. She does have slough overlying the wound bed but otherwise it appears to be doing well. 07/21/17 on evaluation today patient appears to be doing better in regard to her left lateral lower  extremity ulcer. She has been tolerating the dressing changes without complication unfortunately she has  not been tolerating the antibiotics that I prescribed for her last week. Subsequently she was switch to a different antibiotic by her primary care provider due to a urinary tract infection as well unfortunately she also did not tolerate this antibiotic either. Both calls the diarrhea. The only thing that she knows of that she's done well with in the past is Keflex and amoxicillin alone not Augmentin. Fortunately she has no worsening symptoms and in fact the redness and erythema surrounding her wound on the left lateral lower extremity appears to be doing better. Patient History Information obtained from Patient. Family History Cancer - Siblings,Child,Maternal Grandparents, Diabetes - Mother,Siblings, Heart Disease - Father, Hypertension - Siblings, Stroke - Father,Mother, Thyroid Problems - Child, No family history of Hereditary Spherocytosis, Kidney Disease, Lung Disease, Seizures, Tuberculosis. Social History Former smoker - smoked late teens early 59s, Marital Status - Widowed, Alcohol Use - Never, Drug Use - No History, Caffeine Use - Daily. Review of Systems (ROS) Constitutional Symptoms (General Health) Denies complaints or symptoms of Fever, Chills. Respiratory The patient has no complaints or symptoms. Cardiovascular Complains or has symptoms of LE edema. Psychiatric The patient has no complaints or symptoms. Objective Constitutional Well-nourished and well-hydrated in no acute distress. Longley, Tifani F. ([id number]) Vitals Time Taken: 2:24 PM, Height: 63 in, Weight: 164.8 lbs, BMI: 29.2, Temperature: 98.2 F, Pulse: 76 bpm, Respiratory Rate: 18 breaths/min, Blood Pressure: 133/69 mmHg. Respiratory normal breathing without difficulty. Psychiatric this patient is able to make decisions and demonstrates good insight into disease process. Alert and Oriented x 3. pleasant and  cooperative. General Notes: At this point patient does have some Slough noted over the surface of the wound fortunately this was able to be sharply debride it fairly well without complication today she tolerated this with no significant discomfort. Integumentary (Hair, Skin) Wound #1 status is Open. Original cause of wound was Gradually Appeared. The wound is located on the Left,Lateral Lower Leg. The wound measures 2cm length x 2.5cm width x 0.1cm depth; 3.927cm^2 area and 0.393cm^3 volume. There is Fat Layer (Subcutaneous Tissue) Exposed exposed. There is no tunneling or undermining noted. There is a large amount of serous drainage noted. The wound margin is flat and intact. There is medium (34-66%) red granulation within the wound bed. There is a medium (34-66%) amount of necrotic tissue within the wound bed including Adherent Slough. The periwound skin appearance exhibited: Maceration, Hemosiderin Staining, Erythema. The periwound skin appearance did not exhibit: Callus, Crepitus, Excoriation, Induration, Rash, Scarring, Dry/Scaly, Atrophie Blanche, Cyanosis, Ecchymosis, Mottled, Pallor, Rubor. The surrounding wound skin color is noted with erythema which is circumferential. Periwound temperature was noted as No Abnormality. The periwound has tenderness on palpation. 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 There was a Skin/Subcutaneous Tissue Debridement (11042- 11047) debridement with total area of 5 sq cm performed by STONE III, Emmylou Bieker E., PA-C. with the following  instrument(s): Curette to remove Viable and Non-Viable tissue/material including Exudate, Fibrin/Slough, and Subcutaneous after achieving pain control using Lidocaine 4% Topical Solution. A time out was conducted at 14:48, prior to the start of the procedure. A Minimum amount of  bleeding was controlled with Pressure. The procedure was tolerated well with a pain level of 0 throughout and a pain level of 0 following the procedure. Post Debridement Measurements: 2cm length x 2.5cm width x 0.2cm depth; 0.785cm^3 volume. Character of Wound/Ulcer Post Debridement requires further debridement. Severity of Tissue Post Debridement is: Fat layer exposed. Laura [name], [name] ([id number]) Post procedure Diagnosis Wound #1: Same as Pre-Procedure Plan Wound Cleansing: Wound #1 Left,Lateral Lower Leg: Clean wound with wound cleanser. Cleanse wound with mild soap and water May Shower, gently pat wound dry prior to applying new dressing. Other: - pt may take wrap off and shower before nurse comes or before she comes to clinic Anesthetic (add to Medication List): Wound #1 Left,Lateral Lower Leg: Topical Lidocaine 4% cream applied to wound bed prior to debridement (In Clinic Only). Skin Barriers/Peri-Wound Care: Wound #1 Left,Lateral Lower Leg: Barrier cream Primary Wound Dressing: Wound #1 Left,Lateral Lower Leg: Promogran - cut the Promogran and place only on wound bed and moisten with normal saline Secondary Dressing: Wound #1 Left,Lateral Lower Leg: ABD pad Dry Gauze XtraSorb Dressing Change Frequency: Change Dressing Monday, Wednesday, Friday Follow-up Appointments: Return Appointment in 1 week. Edema Control: Wound #1 Left,Lateral Lower Leg: 3 Layer Compression System - Left Lower Extremity - Please wrap 3cm from toes and 3cm from knee. unna to anchor *****DO NOT USE THE COTTON LAYER, PLEASE USE KERLIX INSTEAD***** Elevate legs to the level of the heart and pump ankles as often as  possible Additional Orders / Instructions: Wound #1 Left,Lateral Lower Leg: Increase protein intake. Home Health: Wound #1 Left,Lateral Lower Leg: [place] Nurse may visit PRN to address patient s wound care needs. FACE TO FACE ENCOUNTER: MEDICARE and MEDICAID PATIENTS: I certify that this patient is under my care and that I had a face-to-face encounter that meets the physician face-to-face encounter requirements with this patient on this date. The encounter with the patient was in whole or in part for the following MEDICAL CONDITION: (primary reason for [place]) MEDICAL NECESSITY: I certify, that based on my findings, NURSING services are a medically necessary home health service. HOME BOUND STATUS: I certify that my clinical findings support that this patient is homebound (i.e., Due to illness or injury, pt requires aid of supportive devices such as crutches, cane, wheelchairs, walkers, the use of special transportation or the assistance of another person to leave their place of residence. There is a normal inability to leave the home and doing so requires considerable and taxing effort. Other absences are for medical reasons / religious services and are infrequent or of short duration when for other reasons). If current dressing causes regression in wound condition, may D/C ordered dressing product/s and apply Normal Saline Moist Dressing daily until next [place] / Other MD appointment. [place] of regression in wound condition at [phone]. Please direct any NON-WOUND related issues/requests for orders to patient's Primary Care Physician Medications-please add to medication list.: Prabhakar, Taesha F. ([id number]) Wound #1 Left,Lateral Lower Leg: Other: - Vitamin A, Vitamin C, Zinc, MVI The following medication(s) was prescribed: lidocaine topical 4 % cream 1 1 cream topical was prescribed at facility I am at this point in  time going to recommend that we continue with the Current wound care measures as IDC evidence of improvement. She is in agreement with the plan. We will see were things stand in one weeks time. Please see above for specific wound care orders. We will  see patient for re-evaluation in 1 week(s) here in the clinic. If anything worsens or changes patient will contact our office for additional recommendations. Electronic Signature(s) Signed: 07/21/2017 4:48:53 PM By: Worthy Keeler PA-C Entered By: Worthy Keeler on 07/21/2017 16:35:40 Wirkkala, Jakera F. ([id number]) -------------------------------------------------------------------------------- ROS/PFSH Details Patient Name: Laura [name], [name] Date of Service: 07/21/2017 2:15 PM Medical Record Number: [id number] Patient Account Number: [id number] Date of Birth/Sex: Sep 25, 1924 (84 y.o. Female) Treating RN: Carolyne Fiscal, Debi Primary Care Provider: Halina Maidens Other Clinician: Referring Provider: Halina Maidens Treating Provider/Extender: Melburn Hake, Niyanna Asch Weeks in Treatment: 29 Information Obtained From Patient Wound History Do you currently have one or more open woundso Yes How many open wounds do you currently haveo 1 Approximately how long have you had your woundso 2 weeks How have you been treating your wound(s) until nowo clobetasol Has your wound(s) ever healed and then re-openedo Yes Have you had any lab work done in the past montho No Have you tested positive for an antibiotic resistant organism (MRSA, VRE)o No Have you tested positive for osteomyelitis (bone infection)o Yes Have you had any tests for circulation on your legso Yes [facility] the test [facility] Have you had other problems associated with your woundso Swelling Constitutional Symptoms (General Health) Complaints and Symptoms: Negative for: Fever; Chills Cardiovascular Complaints and Symptoms: Positive for: LE edema Respiratory Complaints and Symptoms: No Complaints or  Symptoms Endocrine Medical History: Positive for: Type II Diabetes Time with diabetes: 2003 Treated with: Oral agents Psychiatric Complaints and Symptoms: No Complaints or Symptoms Immunizations Pneumococcal Vaccine: Received Pneumococcal Vaccination: Yes Implantable Devices Suliman, Rubi F. ([id number]) Family and Social History Cancer: Yes - Siblings,Child,Maternal Grandparents; Diabetes: Yes - Mother,Siblings; Heart Disease: Yes - Father; Hereditary Spherocytosis: No; Hypertension: Yes - Siblings; Kidney Disease: No; Lung Disease: No; Seizures: No; Stroke: Yes - Father,Mother; Thyroid Problems: Yes - Child; Tuberculosis: No; Former smoker - smoked late teens early 79s; Marital Status - Widowed; Alcohol Use: Never; Drug Use: No History; Caffeine Use: Daily; Financial Concerns: No; Food, Clothing or Shelter Needs: No; Support System Lacking: No; Transportation Concerns: No; Advanced Directives: No; Patient does not want information on Advanced Directives; Do not resuscitate: No; Living Will: Yes (Not Provided); Medical Power of Attorney: Yes (Not Provided) Physician Affirmation I have reviewed and agree with the above information. Electronic Signature(s) Signed: 07/21/2017 4:41:26 PM By: Alric Quan Signed: 07/21/2017 4:48:53 PM By: Worthy Keeler PA-C Entered By: Worthy Keeler on 07/21/2017 16:34:52 Odeh, Trenee F. ([id number]) -------------------------------------------------------------------------------- SuperBill Details Patient Name: Balling, Christabella F. Date of Service: 07/21/2017 Medical Record Number: [id number] Patient Account Number: [id number] Date of Birth/Sex: 10/03/1924 (84 y.o. Female) Treating RN: Carolyne Fiscal, Debi Primary Care Provider: Halina Maidens Other Clinician: Referring Provider: Halina Maidens Treating Provider/Extender: Melburn Hake, Gweneth Fredlund Weeks in Treatment: 29 Diagnosis Coding ICD-10 Codes Code Description E11.622 Type 2 diabetes mellitus with other skin  ulcer I10 Essential (primary) hypertension I89.0 Lymphedema, not elsewhere classified L97.822 Non-pressure chronic ulcer of other part of left lower leg with fat layer exposed Facility Procedures CPT4 Code Description: [id number] 11042 - DEB SUBQ TISSUE 20 SQ CM/< ICD-10 Diagnosis Description L97.822 Non-pressure chronic ulcer of other part of left lower leg with Modifier: fat layer expos Quantity: 1 ed Physician Procedures CPT4 Code Description: [id number] - WC PHYS SUBQ TISS 20 SQ CM ICD-10 Diagnosis Description L97.822 Non-pressure chronic ulcer of other part of left lower leg with Modifier: fat layer expos Quantity: 1 ed Electronic Signature(s) Signed: 07/21/2017 4:48:53 PM By:  Melburn Hake, Marq Rebello PA-C Entered By: Worthy Keeler on 07/21/2017 16:35:49

## 2017-07-28 ENCOUNTER — Ambulatory Visit
Admission: RE | Admit: 2017-07-28 | Discharge: 2017-07-28 | Disposition: A | Discharge status: Home / Self Care | Payer: Medicare PPO | Source: Ambulatory Visit | Attending: Physician Assistant | Admitting: Physician Assistant

## 2017-07-28 ENCOUNTER — Other Ambulatory Visit
Admission: RE | Admit: 2017-07-28 | Discharge: 2017-07-28 | Disposition: A | Discharge status: Home / Self Care | Payer: Medicare PPO | Source: Ambulatory Visit | Attending: Physician Assistant | Admitting: Physician Assistant

## 2017-07-28 ENCOUNTER — Other Ambulatory Visit: Payer: Self-pay | Admitting: Physician Assistant

## 2017-07-28 ENCOUNTER — Encounter: Payer: Medicare PPO | Admitting: Physician Assistant

## 2017-07-28 DIAGNOSIS — X58XXXA Exposure to other specified factors, initial encounter: Secondary | ICD-10-CM | POA: Diagnosis not present

## 2017-07-28 DIAGNOSIS — N3001 Acute cystitis with hematuria: Secondary | ICD-10-CM | POA: Insufficient documentation

## 2017-07-28 DIAGNOSIS — M1712 Unilateral primary osteoarthritis, left knee: Secondary | ICD-10-CM | POA: Diagnosis not present

## 2017-07-28 DIAGNOSIS — S81802A Unspecified open wound, left lower leg, initial encounter: Secondary | ICD-10-CM | POA: Insufficient documentation

## 2017-07-28 DIAGNOSIS — S81801A Unspecified open wound, right lower leg, initial encounter: Secondary | ICD-10-CM

## 2017-07-28 DIAGNOSIS — I70209 Unspecified atherosclerosis of native arteries of extremities, unspecified extremity: Secondary | ICD-10-CM | POA: Diagnosis not present

## 2017-07-28 DIAGNOSIS — E119 Type 2 diabetes mellitus without complications: Secondary | ICD-10-CM | POA: Insufficient documentation

## 2017-07-28 DIAGNOSIS — E11622 Type 2 diabetes mellitus with other skin ulcer: Secondary | ICD-10-CM | POA: Diagnosis not present

## 2017-07-31 NOTE — Progress Notes (Signed)
BUSER, Aubrii F. ([id number]) Visit Report for 07/28/2017 Arrival Information Details Patient Name: KILMARTIN, [name] Date of Service: 07/28/2017 2:15 PM Medical Record Number: [id number] Patient Account Number: [id number] Date of Birth/Sex: June 03, 1925 (84 y.o. Female) Treating RN: Roger Shelter Primary Care Claudell Wohler: Halina Maidens Other Clinician: Referring Domonique Cothran: Halina Maidens Treating Sabena Winner/Extender: Melburn Hake, HOYT Weeks in Treatment: 90 Visit Information History Since Last Visit All ordered tests and consults were completed: No Patient Arrived: Kasandra Knudsen Added or deleted any medications: No Arrival Time: 14:48 Any new allergies or adverse reactions: No Accompanied By: daughter Had a fall or experienced change in No Transfer Assistance: None activities of daily living that may affect Patient Identification Verified: Yes risk of falls: Secondary Verification Process Completed: Yes Signs or symptoms of abuse/neglect since last visito No Patient Requires Transmission-Based Precautions: No Hospitalized since last visit: No Patient Has Alerts: Yes Pain Present Now: No Patient Alerts: DM II Electronic Signature(s) Signed: 07/28/2017 4:49:48 PM By: Roger Shelter Entered By: Roger Shelter on 07/28/2017 14:48:45 Mccreedy, Tracy F. ([id number]) -------------------------------------------------------------------------------- Encounter Discharge Information Details Patient Name: [name], [name] Date of Service: 07/28/2017 2:15 PM Medical Record Number: [id number] Patient Account Number: [id number] Date of Birth/Sex: 07-01-1924 (84 y.o. Female) Treating RN: Roger Shelter Primary Care Teasha Murrillo: Halina Maidens Other Clinician: Referring Jazlin Tapscott: Halina Maidens Treating Makenze Ellett/Extender: Melburn Hake, HOYT Weeks in Treatment: 23 Encounter Discharge Information Items Discharge Pain Level: 0 Discharge Condition: Stable Ambulatory Status: Cane Discharge Destination: Home Transportation: Private  Auto Accompanied By: caregiver Schedule Follow-up Appointment: Yes Medication Reconciliation completed and No provided to Patient/Care Correne Lalani: Provided on Clinical Summary of Care: 07/28/2017 Form Type Recipient Paper Patient [facility] Electronic Signature(s) Signed: 07/28/2017 3:51:49 PM By: Sharon Mt Entered By: Sharon Mt on 07/28/2017 15:51:48 Guandique, Rodnisha F. ([id number]) -------------------------------------------------------------------------------- Lower Extremity Assessment Details Patient Name: Phetteplace, [name] Date of Service: 07/28/2017 2:15 PM Medical Record Number: [id number] Patient Account Number: [id number] Date of Birth/Sex: [birth year] (84 y.o. Female) Treating RN: Roger Shelter Primary Care Jonel Sick: Halina Maidens Other Clinician: Referring Imani Fiebelkorn: Halina Maidens Treating Ysidro Ramsay/Extender: Melburn Hake, HOYT Weeks in Treatment: 30 Edema Assessment Assessed: [Left: No] [Right: No] [Left: Edema] [Right: :] Calf Left: Right: Point of Measurement: 34 cm From Medial Instep 32.5 cm cm Ankle Left: Right: Point of Measurement: 10 cm From Medial Instep 22 cm cm Vascular Assessment Pulses: Dorsalis Pedis Palpable: [Left:Yes] Posterior Tibial Extremity colors, hair growth, and conditions: Extremity Color: [Left:Red] Temperature of Extremity: [Left:Warm] Capillary Refill: [Left:< 3 seconds] Toe Nail Assessment Left: Right: Thick: No Discolored: No Deformed: No Improper Length and Hygiene: No Electronic Signature(s) Signed: 07/28/2017 4:49:48 PM By: Roger Shelter Entered By: Roger Shelter on 07/28/2017 14:59:28 Antolin, Rainee F. ([id number]) -------------------------------------------------------------------------------- Multi Wound Chart Details Patient Name: Harman, [name] Date of Service: 07/28/2017 2:15 PM Medical Record Number: [id number] Patient Account Number: [id number] Date of Birth/Sex: 1924-09-06 (84 y.o. Female) Treating RN: Roger Shelter Primary Care  Julion Gatt: Halina Maidens Other Clinician: Referring Isela Stantz: Halina Maidens Treating Jeanne Terrance/Extender: Melburn Hake, HOYT Weeks in Treatment: 30 Vital Signs Height(in): 38 Pulse(bpm): 80 Weight(lbs): 164.8 Blood Pressure(mmHg): 152/92 Body Mass Index(BMI): 29 Temperature(F): 98.2 Respiratory Rate 18 (breaths/min): Photos: [1:No Photos] [N/A:N/A] Wound Location: [1:Left Lower Leg - Lateral] [N/A:N/A] Wounding Event: [1:Gradually Appeared] [N/A:N/A] Primary Etiology: [1:Diabetic Wound/Ulcer of the Lower Extremity] [N/A:N/A] Comorbid History: [1:Type II Diabetes] [N/A:N/A] Date Acquired: [1:12/16/2016] [N/A:N/A] Weeks of Treatment: [1:30] [N/A:N/A] Wound Status: [1:Open] [N/A:N/A] Measurements L x W x D [1:2x2.5x0.1] [N/A:N/A] (cm) Area (cm) : [1:3.927] [N/A:N/A] Volume (cm) : [1:0.393] [  N/A:N/A] % Reduction in Area: [1:-299.90%] [N/A:N/A] % Reduction in Volume: [1:-301.00%] [N/A:N/A] Classification: [1:Grade 1] [N/A:N/A] Exudate Amount: [1:Large] [N/A:N/A] Exudate Type: [1:Serous] [N/A:N/A] Exudate Color: [1:amber] [N/A:N/A] Wound Margin: [1:Flat and Intact] [N/A:N/A] Granulation Amount: [1:Small (1-33%)] [N/A:N/A] Granulation Quality: [1:Red] [N/A:N/A] Necrotic Amount: [1:Large (67-100%)] [N/A:N/A] Exposed Structures: [1:Fat Layer (Subcutaneous Tissue) Exposed: Yes Fascia: No Tendon: No Muscle: No Joint: No Bone: No] [N/A:N/A] Epithelialization: [1:Small (1-33%)] [N/A:N/A] Periwound Skin Texture: [1:Excoriation: Yes Induration: No Callus: No Crepitus: No Rash: No Scarring: No] [N/A:N/A] Periwound Skin Moisture: Maceration: Yes N/A N/A Dry/Scaly: No Periwound Skin Color: Erythema: Yes N/A N/A Hemosiderin Staining: Yes Atrophie Blanche: No Cyanosis: No Ecchymosis: No Mottled: No Pallor: No Rubor: No Erythema Location: Circumferential N/A N/A Temperature: No Abnormality N/A N/A Tenderness on Palpation: Yes N/A N/A Wound Preparation: Ulcer Cleansing: N/A  N/A Rinsed/Irrigated with Saline, Other: soap and water Topical Anesthetic Applied: Other: lidocaine 4% Treatment Notes Electronic Signature(s) Signed: 07/28/2017 4:49:48 PM By: Roger Shelter Entered By: Roger Shelter on 07/28/2017 15:13:23 Linnemann, Maurica F. ([id number]) -------------------------------------------------------------------------------- Samson Details Patient Name: [name], [name] Date of Service: 07/28/2017 2:15 PM Medical Record Number: [id number] Patient Account Number: [id number] Date of Birth/Sex: 01-02-1925 (84 y.o. Female) Treating RN: Roger Shelter Primary Care Asna Muldrow: Halina Maidens Other Clinician: Referring Sabre Leonetti: Halina Maidens Treating Indigo Chaddock/Extender: Melburn Hake, HOYT Weeks in Treatment: 30 Active Inactive ` Abuse / Safety / Falls / Self Care Management Nursing Diagnoses: Potential for falls Goals: Patient will not experience any injury related to falls Date Initiated: 12/30/2016 Target Resolution Date: 04/29/2017 Goal Status: Active Interventions: Assess Activities of Daily Living upon admission and as needed Assess: immobility, friction, shearing, incontinence upon admission and as needed Notes: ` Nutrition Nursing Diagnoses: Imbalanced nutrition Impaired glucose control: actual or potential Potential for alteratiion in Nutrition/Potential for imbalanced nutrition Goals: Patient/caregiver will maintain therapeutic glucose control Date Initiated: 12/30/2016 Target Resolution Date: 04/29/2017 Goal Status: Active Interventions: Assess patient nutrition upon admission and as needed per policy Notes: ` Orientation to the Wound Care Program Nursing Diagnoses: Knowledge deficit related to the wound healing center program Goals: Patient/caregiver will verbalize understanding of the [place] Date Initiated: 12/30/2016 Target Resolution Date: 01/21/2017 Notz, Jacqulin Wanda Plump ([id number]) Goal Status:  Active Interventions: Provide education on orientation to the wound center Notes: ` Pain, Acute or Chronic Nursing Diagnoses: Pain, acute or chronic: actual or potential Potential alteration in comfort, pain Goals: Patient/caregiver will verbalize adequate pain control between visits Date Initiated: 12/30/2016 Target Resolution Date: 04/29/2017 Goal Status: Active Interventions: Complete pain assessment as per visit requirements Notes: ` Wound/Skin Impairment Nursing Diagnoses: Impaired tissue integrity Knowledge deficit related to smoking impact on wound healing Knowledge deficit related to ulceration/compromised skin integrity Goals: Ulcer/skin breakdown will have a volume reduction of 80% by week 12 Date Initiated: 12/30/2016 Target Resolution Date: 04/22/2017 Goal Status: Active Interventions: Assess patient/caregiver ability to perform ulcer/skin care regimen upon admission and as needed Notes: Electronic Signature(s) Signed: 07/28/2017 4:49:48 PM By: Roger Shelter Entered By: Roger Shelter on 07/28/2017 15:12:57 Fuquay, Raiven F. ([id number]) -------------------------------------------------------------------------------- Pain Assessment Details Patient Name: Seidel, Brittley F. Date of Service: 07/28/2017 2:15 PM Medical Record Number: [id number] Patient Account Number: [id number] Date of Birth/Sex: 11/11/1924 (84 y.o. Female) Treating RN: Roger Shelter Primary Care Kalista Laguardia: Halina Maidens Other Clinician: Referring Gracia Saggese: Halina Maidens Treating Ronalda Walpole/Extender: Melburn Hake, HOYT Weeks in Treatment: 30 Active Problems Location of Pain Severity and Description of Pain Patient Has Paino No Site Locations Pain Management and Medication Current Pain  Management: Electronic Signature(s) Signed: 07/28/2017 4:49:48 PM By: Roger Shelter Entered By: Roger Shelter on 07/28/2017 14:48:53 Jipson, Gabrielle F.  ([id number]) -------------------------------------------------------------------------------- Patient/Caregiver Education Details Patient Name: [name], Jordie F. Date of Service: 07/28/2017 2:15 PM Medical Record Number: [id number] Patient Account Number: [id number] Date of Birth/Gender: 12-05-1924 (84 y.o. Female) Treating RN: Roger Shelter Primary Care Physician: Halina Maidens Other Clinician: Referring Physician: Halina Maidens Treating Physician/Extender: Melburn Hake, HOYT Weeks in Treatment: 18 Education Assessment Education Provided To: Patient Education Topics Provided Wound/Skin Impairment: Handouts: Caring for Your Ulcer, Other: change dressing as ordered Methods: Demonstration, Explain/Verbal Responses: State content correctly Electronic Signature(s) Signed: 07/28/2017 4:49:48 PM By: Roger Shelter Entered By: Roger Shelter on 07/28/2017 15:34:32 Matus, Gabriana F. ([id number]) -------------------------------------------------------------------------------- Wound Assessment Details Patient Name: Facer, [name] Date of Service: 07/28/2017 2:15 PM Medical Record Number: [id number] Patient Account Number: [id number] Date of Birth/Sex: 10-Mar-1925 (84 y.o. Female) Treating RN: Roger Shelter Primary Care Narya Beavin: Halina Maidens Other Clinician: Referring Auguste Tebbetts: Halina Maidens Treating Antionette Luster/Extender: Melburn Hake, HOYT Weeks in Treatment: 30 Wound Status Wound Number: 1 Primary Etiology: Diabetic Wound/Ulcer of the Lower Extremity Wound Location: Left Lower Leg - Lateral Wound Status: Open Wounding Event: Gradually Appeared Comorbid Type II Diabetes Date Acquired: 12/16/2016 History: Weeks Of Treatment: 30 Clustered Wound: No Photos Photo Uploaded By: Alric Quan on 07/28/2017 16:50:14 Wound Measurements Length: (cm) 2 Width: (cm) 2.5 Depth: (cm) 0.1 Area: (cm) 3.927 Volume: (cm) 0.393 % Reduction in Area: -299.9% % Reduction in Volume:  -301% Epithelialization: Small (1-33%) Tunneling: No Undermining: No Wound Description Classification: Grade 1 Wound Margin: Flat and Intact Exudate Amount: Large Exudate Type: Serous Exudate Color: amber Foul Odor After Cleansing: No Slough/Fibrino Yes Wound Bed Granulation Amount: Small (1-33%) Exposed Structure Granulation Quality: Red Fascia Exposed: No Necrotic Amount: Large (67-100%) Fat Layer (Subcutaneous Tissue) Exposed: Yes Necrotic Quality: Adherent Slough Tendon Exposed: No Muscle Exposed: No Joint Exposed: No Bone Exposed: No Periwound Skin Texture Gaubert, Bailee F. ([id number]) Texture Color No Abnormalities Noted: No No Abnormalities Noted: No Callus: No Atrophie Blanche: No Crepitus: No Cyanosis: No Excoriation: Yes Ecchymosis: No Induration: No Erythema: Yes Rash: No Erythema Location: Circumferential Scarring: No Hemosiderin Staining: Yes Mottled: No Moisture Pallor: No No Abnormalities Noted: No Rubor: No Dry / Scaly: No Maceration: Yes Temperature / Pain Temperature: No Abnormality Tenderness on Palpation: Yes Wound Preparation Ulcer Cleansing: Rinsed/Irrigated with Saline, Other: soap and water, Topical Anesthetic Applied: Other: lidocaine 4%, Treatment Notes Wound #1 (Left, Lateral Lower Leg) 1. Cleansed with: Clean wound with Normal Saline 2. Anesthetic Topical Lidocaine 4% cream to wound bed prior to debridement 3. Peri-wound Care: Barrier cream 4. Dressing Applied: Hydrafera Blue 5. Secondary Dressing Applied ABD Pad Dry Gauze 7. Secured with Tape 3 Layer Compression System - Left Lower Extremity Notes unna to anchor Electronic Signature(s) Signed: 07/28/2017 4:49:48 PM By: Roger Shelter Entered By: Roger Shelter on 07/28/2017 14:57:33 Sterne, Kaityln F. ([id number]) -------------------------------------------------------------------------------- Vitals Details Patient Name: Dockendorf, [name] Date of Service: 07/28/2017 2:15  PM Medical Record Number: [id number] Patient Account Number: [id number] Date of Birth/Sex: December 13, 1924 (84 y.o. Female) Treating RN: Roger Shelter Primary Care Marlyss Cissell: Halina Maidens Other Clinician: Referring Tavaras Goody: Halina Maidens Treating Alyssamarie Mounsey/Extender: Melburn Hake, HOYT Weeks in Treatment: 30 Vital Signs Time Taken: 14:48 Temperature (F): 98.2 Height (in): 63 Pulse (bpm): 80 Weight (lbs): 164.8 Respiratory Rate (breaths/min): 18 Body Mass Index (BMI): 29.2 Blood Pressure (mmHg): 152/92 Reference Range: 80 - 120 mg / dl Notes BP taken manually Electronic Signature(s) Signed: 07/28/2017  4:49:48 PM By: Roger Shelter Entered ByRoger Shelter on 07/28/2017 14:52:34

## 2017-08-01 ENCOUNTER — Telehealth: Payer: Self-pay

## 2017-08-01 NOTE — Telephone Encounter (Signed)
Patient family friend Dorian Pod- called stating patient is still having symptoms of bladder infection. Having a strong odor, and throbbing pain in pelvic region.  Spoke with Dr Army Melia- she said it would be best for patient to give a urine specimen. No OV needed. And we will do urine culture for urine to see what antibiotics is best for infection. If this continues will need to see Urology.  Called and left Dorian Pod VM on her mobile phone informing her of this and to call tomorrow if she has anymore questions.

## 2017-08-02 ENCOUNTER — Other Ambulatory Visit: Payer: Self-pay

## 2017-08-02 DIAGNOSIS — N3090 Cystitis, unspecified without hematuria: Secondary | ICD-10-CM

## 2017-08-02 LAB — AEROBIC/ANAEROBIC CULTURE (SURGICAL/DEEP WOUND)
CULTURE: NORMAL
GRAM STAIN: NONE SEEN

## 2017-08-02 LAB — AEROBIC/ANAEROBIC CULTURE W GRAM STAIN (SURGICAL/DEEP WOUND)

## 2017-08-02 NOTE — Progress Notes (Signed)
[name], [name] ([id number]) Visit Report for 07/28/2017 Chief Complaint Document Details [name], [name] Date of Service: 07/28/2017 2:15 PM Medical Record Number: [id number] Patient Account Number: [id number] Date of Birth/Sex: November 21, 1924 (84 y.o. Female) Treating RN: Ahmed Prima Primary Care Provider: Halina Maidens Other Clinician: Referring Provider: Halina Maidens Treating Provider/Extender: Melburn Hake,  Weeks in Treatment: 30 Information Obtained from: Patient Chief Complaint Left lower leg cellulitis Electronic Signature(s) Signed: 07/31/2017 9:06:28 AM By: Worthy Keeler PA-C Entered By: Worthy Keeler on 07/28/2017 14:42:28 [name], [name] ([id number]) -------------------------------------------------------------------------------- HPI Details [name], [name] Date of Service: 07/28/2017 2:15 PM Medical Record Number: [id number] Patient Account Number: [id number] Date of Birth/Sex: 12-14-1924 (84 y.o. Female) Treating RN: Carolyne Fiscal, Debi Primary Care Provider: Halina Maidens Other Clinician: Referring Provider: Halina Maidens Treating Provider/Extender: Melburn Hake,  Weeks in Treatment: 30 History of Present Illness HPI Description: 12/30/16 Patient presents today for initial evaluation concerning an area over her left lateral ankle which she tells me has been intermittent in nature since 2000. However the current opening has been present for about two weeks. She has been tolerating the dressing changes at home with antibiotic ointment but that is really the only treatment that has been initiated at this point. That is other than the oral antibiotics which was Keflex that patient was placed on by her primary care provider prior to being referred to [place]. She does have some discomfort but rates this to be around a 2-3 out of 10 fortunately the redness does not seem to be spreading to any other location as far as her lower extremity is concerned. She does  tell me that in the past antibiotics have been beneficial for her unfortunately the current antibiotics have not been of benefit and no wound culture was performed as of yet. She has previously seen Dr. 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 She does have diabetes and are most recent blood sugars have run between 104 and 106 according to patient's although her last hemoglobin A1c she is unaware of. Patient's current wound does not appear to be significantly open but is rather more of a weeping region of cellulitis. 01/27/17 on evaluation today patient left lower for many wound continues to show signs of erythema surrounding and in fact she is noted to have erythema from her toes up to just below the knee in regard to left lower extremity. She is not having any fevers, chills, nausea, vomiting, or diarrhea at this point. With that being said she is concerned about the silver alginate dressing. She tells me that in the past she used silver and some of the dressings for previous one and did not do well with it. Nonetheless she has discomfort along the wound itself but no other pain noted throughout the left lower extremity. 02/03/17 on evaluation today patient appears to be doing fairly well overall other than the fact that her wound is worsening on the lateral aspect of her left lower extremity. It appears more macerated and even slough covered at this point. With that being said she has been attempting to perform the dressing changes on her own although I'm not sure that she is understanding exactly how this should be done. Nonetheless I do believe the gentamicin may be causing too much maceration she really has not  wanted to utilize the silver alginate dressings past due to a previous issue with this. Overall her erythema  of the left lower extremity is improved she still has significant swelling. No fevers, chills, nausea, or vomiting noted at this time. 02/10/2017 -- Old notes received -- was seen in 2009 by [facility] by Dr. Romie Levee -- his impression was chronic patches of dermatitis on the left lower extremity and current mild swelling. He recommended venous insufficiency venous reflux studies. She had no evidence of arterial insufficiency. Follow-up chronic venous insufficiency study done on 12/16/2007, showed very mild amount of reflux on the great saphenous vein at the knee but the function of the vein is normal and there was no evidence by the venous reflux study.. They referred her to dermatology for a second opinion and would see her back in about 4-6 weeks. She was seen back in follow-up in January 01 2008 and at that time she was asked to use a steroid ointment locally and the wounds are almost completely healed. The patient has told me today that she does not tolerate silver dressing -- and she has not had home health come and change her dressings over the last week. 02/17/2017 -- - had a lower arterial study done which showed a bilateral ABI suggests no significant lower extremity arterial disease and the toe brachial indicis were normal on the right and abnormal on the left. The right ABI was 1.0 and the left was 1.07. The right toe brachial indices was 0.81 on the right and 0.56 on the left. She had biphasic and triphasic flow at the tibial vessels. 02/24/2017 -- the patient has been tolerating a 3 layer wrap and seems to be pleased with her progress 03/17/17 on evaluation today patient appears to be doing better in regard to her left lateral lower extremity wound. She has been Tolerating the wraps without complication. No fevers, chills, nausea, or vomiting noted at this time. She is pleased with [name], [name] ([id number]) how things are progressing. Early complaint is that she is unable to take  a shower for such a long time from Wednesday all the way till the following Monday. 07/14/17 on evaluation today patient appears to be doing decently well in regard to her left lateral lower extremity ulcer. She has been tolerating the dressing changes without complication. With that being said she does have more irritation noted today compared to last week as far as redness surrounding and up the interior portion of her shin. I do not believe this is pressure at this time. I likely think this is more of a cellulitis type issue although fortunately she does not seem to have any significant discomfort. In these compared to previous. She does have slough overlying the wound bed but otherwise it appears to be doing well. 07/21/17 on evaluation today patient appears to be doing better in regard to her left lateral lower extremity ulcer. She has been tolerating the dressing changes without complication unfortunately she has not been tolerating the antibiotics that I prescribed for her last week. Subsequently she was switch to a different antibiotic by her primary care provider due to a urinary tract infection as well unfortunately she also did not tolerate this antibiotic either. Both calls the diarrhea. The only thing that she knows of that she's done well with in the past is Keflex and amoxicillin alone not Augmentin. Fortunately she has no worsening symptoms and in fact the redness and erythema surrounding her wound on the  left lateral lower extremity appears to be doing better. 07/28/17 on evaluation today patient appears to be doing a little worse in regard to the erythema surrounding her ulcer. With that being said the altar itself did have slough covering although I need to perform any debridement today at the slough was able to be removed with saline and gauze. Overall the patient feels like the wound looks better although again my concern is erythema surrounding. We have not done a culture since  July I think I'm going to today. Electronic Signature(s) Signed: 07/31/2017 9:06:28 AM By: Worthy Keeler PA-C Entered By: Worthy Keeler on 07/28/2017 17:33:29 [name], [name] ([id number]) -------------------------------------------------------------------------------- Physical Exam Details Patient Name: Folz, Samanthia F. Date of Service: 07/28/2017 2:15 PM Medical Record Number: [id number] Patient Account Number: [id number] Date of Birth/Sex: 1924-12-01 (84 y.o. Female) Treating RN: Ahmed Prima Primary Care Provider: Halina Maidens Other Clinician: Referring Provider: Halina Maidens Treating Provider/Extender: STONE III,  Weeks in Treatment: 49 Constitutional Well-nourished and well-hydrated in no acute distress. Respiratory normal breathing without difficulty. clear to auscultation bilaterally. Cardiovascular regular rate and rhythm with normal S1, S2. Psychiatric this patient is able to make decisions and demonstrates good insight into disease process. Alert and Oriented x 3. pleasant and cooperative. Notes Patient's wound was cleaned with saline and gauze and then a wound culture obtained today. We will see what this shows and consider antibiotic therapy depending on the results. With that being said this is somewhat difficult due to the fact that she has a lot of reactions to medications. Electronic Signature(s) Signed: 07/31/2017 9:06:28 AM By: Worthy Keeler PA-C Entered By: Worthy Keeler on 07/28/2017 17:34:13 Gravley, Bliss F. ([id number]) -------------------------------------------------------------------------------- Physician Orders Details Patient Name: Kleier, Sheina F. Date of Service: 07/28/2017 2:15 PM Medical Record Number: [id number] Patient Account Number: [id number] Date of Birth/Sex: 1925/01/11 (84 y.o. Female) Treating RN: Roger Shelter Primary Care Provider: Halina Maidens Other Clinician: Referring Provider: Halina Maidens Treating Provider/Extender: Melburn Hake,  Weeks in Treatment: 30 Verbal / Phone Orders: Yes Clinician: Roger Shelter Read Back and Verified: Yes Diagnosis Coding ICD-10 Coding Code Description E11.622 Type 2 diabetes mellitus with other skin ulcer I10 Essential (primary) hypertension I89.0 Lymphedema, not elsewhere classified L97.822 Non-pressure chronic ulcer of other part of left lower leg with fat layer exposed Wound Cleansing Wound #1 Left,Lateral Lower Leg o Clean wound with wound cleanser. o Cleanse wound with mild soap and water o May Shower, gently pat wound dry prior to applying new dressing. o Other: - pt may take wrap off and shower before nurse comes or before she comes to clinic Anesthetic (add to Medication List) Wound #1 Left,Lateral Lower Leg o Topical Lidocaine 4% cream applied to wound bed prior to debridement (In Clinic Only). Skin Barriers/Peri-Wound Care Wound #1 Left,Lateral Lower Leg o Barrier cream Primary Wound Dressing Wound #1 Left,Lateral Lower Leg o Hydrafera Blue - hydrafera blue ready transfer Secondary Dressing Wound #1 Left,Lateral Lower Leg o ABD pad o Dry Gauze Dressing Change Frequency o Change Dressing Monday, Wednesday, Friday Follow-up Appointments o Return Appointment in 1 week. Edema Control Wound #1 Left,Lateral Lower Leg o 3 Layer Compression System - Left Lower Extremity - Please wrap 3cm from toes and 3cm from knee. unna to anchor *****DO NOT USE THE COTTON LAYER, PLEASE USE KERLIX INSTEAD***** Bruning, Kyah F. ([id number]) o Elevate legs to the level of the heart and pump ankles as often as possible Additional Orders / Instructions Wound #1 Left,Lateral Lower Leg   o Increase protein intake. Home Health Wound #1 [place] Nurse may visit PRN to address patientos wound care needs. o FACE TO FACE ENCOUNTER: MEDICARE and MEDICAID PATIENTS: I certify that this patient is  under my care and that I had a face-to-face encounter that meets the physician face-to-face encounter requirements with this patient on this date. The encounter with the patient was in whole or in part for the following MEDICAL CONDITION: (primary reason for [place]) MEDICAL NECESSITY: I certify, that based on my findings, NURSING services are a medically necessary home health service. HOME BOUND STATUS: I certify that my clinical findings support that this patient is homebound (i.e., Due to illness or injury, pt requires aid of supportive devices such as crutches, cane, wheelchairs, walkers, the use of special transportation or the assistance of another person to leave their place of residence. There is a normal inability to leave the home and doing so requires considerable and taxing effort. Other absences are for medical reasons / religious services and are infrequent or of short duration when for other reasons). o If current dressing causes regression in wound condition, may D/C ordered dressing product/s and apply Normal Saline Moist Dressing daily until next [place] / Other MD appointment. [place] of regression in wound condition at [phone]. o Please direct any NON-WOUND related issues/requests for orders to patient's Primary Care Physician Medications-please add to medication list. Wound #1 Left,Lateral Lower Leg o Other: - Vitamin A, Vitamin C, Zinc, [facility] Laboratory o Bacteria identified in Wound by Culture (MICRO) oooo LOINC Code: 4315-4 oooo Convenience Name: Wound culture routine Radiology o X-ray, lower leg Patient Medications Allergies: Augmentin, calcium channel blockers, nitrofurantoin, ACE Inhibitors, atorvastatin, Beta-Blockers (Beta-Adrenergic Blocking Agts), Levaquin, pravastatin, Statins-Hmg-Coa Reductase Inhibitors, sulfa, clindamycin, lincomycin, SILVER, doxycycline Notifications Medication Indication Start  End lidocaine DOSE 1 - topical 4 % cream - 1 cream topical Electronic Signature(s) Signed: 07/28/2017 4:49:48 PM By: Roger Shelter Signed: 07/31/2017 9:06:28 AM By: Ezekiel [name], [name] ([id number]) Entered By: Roger Shelter on 07/28/2017 15:22:21 Brashear, Ailee F. ([id number]) -------------------------------------------------------------------------------- Prescription 07/28/2017 Patient Name: [name], Kassidee F. Provider: Worthy Keeler PA-C Date of Birth: 1924-09-14 NPI#: [id number] Sex: F DEA#: [id number] Phone #: [phone] License #: Patient Address: [facility] [place], [place] [address] [address], [place], [place] [address] [phone] Allergies Augmentin calcium channel blockers nitrofurantoin ACE Inhibitors atorvastatin Beta-Blockers (Beta-Adrenergic Blocking Agts) Levaquin pravastatin Statins-Hmg-Coa Reductase Inhibitors sulfa clindamycin lincomycin SILVER doxycycline Medication Schreckengost, Gurbani F. ([id number]) Medication: Route: Strength: Form: lidocaine 4 % topical cream topical 4% cream Class: TOPICAL LOCAL ANESTHETICS Dose: Frequency / Time: Indication: 1 1 cream topical Number of Refills: Number of Units: 0 Generic Substitution: Start Date: End Date: One Time Use: Substitution Permitted No Note to Pharmacy: Signature(s): Date(s): Electronic Signature(s) Signed: 07/28/2017 4:49:48 PM By: Roger Shelter Signed: 07/31/2017 9:06:28 AM By: Worthy Keeler PA-C Entered By: Roger Shelter on 07/28/2017 15:22:22 Trostel, Anda F. ([id number]) --------------------------------------------------------------------------------  Problem List Details Patient Name: Ryles, Messina F. Date of Service: 07/28/2017 2:15 PM Medical Record Number: [id number] Patient Account Number: [id number] Date of Birth/Sex: [birth year] (84 y.o. Female) Treating RN: Ahmed Prima Primary Care  Provider: Halina Maidens Other Clinician: Referring Provider: Halina Maidens Treating Provider/Extender: Melburn Hake,  Weeks in Treatment: 30 Active Problems ICD-10 Encounter Code Description Active Date Diagnosis E11.622 Type 2 diabetes mellitus  with other skin ulcer 01/02/2017 Yes I10 Essential (primary) hypertension 01/02/2017 Yes I89.0 Lymphedema, not elsewhere classified 01/16/2017 Yes L97.822 Non-pressure chronic ulcer of other part of left lower leg with fat 06/29/2017 Yes layer exposed Inactive Problems Resolved Problems ICD-10 Code Description Active Date Resolved Date L03.116 Cellulitis of left lower limb 01/02/2017 01/02/2017 Electronic Signature(s) Signed: 07/31/2017 9:06:28 AM By: Worthy Keeler PA-C Entered By: Worthy Keeler on 07/28/2017 14:42:22 Kozinski, Heavan F. ([id number]) -------------------------------------------------------------------------------- Progress Note Details Patient Name: Vanyo, Marye F. Date of Service: 07/28/2017 2:15 PM Medical Record Number: [id number] Patient Account Number: [id number] Date of Birth/Sex: 05-Jan-1925 (84 y.o. Female) Treating RN: Carolyne Fiscal, Debi Primary Care Provider: Halina Maidens Other Clinician: Referring Provider: Halina Maidens Treating Provider/Extender: Melburn Hake,  Weeks in Treatment: 30 Subjective Chief Complaint Information obtained from Patient Left lower leg cellulitis History of Present Illness (HPI) 12/30/16 Patient presents today for initial evaluation concerning an area over her left lateral ankle which she tells me has been intermittent in nature since 2000. However the current opening has been present for about two weeks. She has been tolerating the dressing changes at home with antibiotic ointment but that is really the only treatment that has been initiated at this point. That is other than the oral antibiotics which was Keflex that patient was placed on by her primary care provider prior to being referred  to [place]. She does have some discomfort but rates this to be around a 2-3 out of 10 fortunately the redness does not seem to be spreading to any other location as far as her lower extremity is concerned. She does tell me that in the past antibiotics have been beneficial for her unfortunately the current antibiotics have not been of benefit and no wound culture was performed as of yet. She has previously seen Dr. 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 She does have diabetes and are most recent blood sugars have run between 104 and 106 according to patient's although her last hemoglobin A1c she is unaware of. Patient's current wound does not appear to be significantly open but is rather more of a weeping region of cellulitis. 01/27/17 on evaluation today patient left lower for many wound continues to show signs of erythema surrounding and in fact she is noted to have erythema from her toes up to just below the knee in regard to left lower extremity. She is not having any fevers, chills, nausea, vomiting, or diarrhea at this point. With that being said she is concerned about the silver alginate dressing. She tells me that in the past she used silver and some of the dressings for previous one and did not do well with it. Nonetheless she has discomfort along the wound itself but no other pain noted throughout the left lower extremity. 02/03/17 on evaluation today patient appears to be doing fairly well overall other than the fact that her wound is worsening on the lateral aspect of her left lower extremity. It appears more macerated and even slough covered at this point. With that being said she has been attempting to perform the dressing changes on her own although I'm not sure that she is understanding exactly how this should be  done. Nonetheless I do believe the gentamicin may be causing too much maceration she really has not wanted to utilize the silver  alginate dressings past due to a previous issue with this. Overall her erythema of the left lower extremity is improved she still has significant swelling. No fevers, chills, nausea, or vomiting noted at this time. 02/10/2017 -- Old notes received -- was seen in 2009 by [facility] by Dr. Romie Levee -- his impression was chronic patches of dermatitis on the left lower extremity and current mild swelling. He recommended venous insufficiency venous reflux studies. She had no evidence of arterial insufficiency. Follow-up chronic venous insufficiency study done on 12/16/2007, showed very mild amount of reflux on the great saphenous vein at the knee but the function of the vein is normal and there was no evidence by the venous reflux study.. They referred her to dermatology for a second opinion and would see her back in about 4-6 weeks. She was seen back in follow-up in January 01 2008 and at that time she was asked to use a steroid ointment locally and the wounds are almost completely healed. The patient has told me today that she does not tolerate silver dressing -- and she has not had home health come and change her dressings over the last week. 02/17/2017 -- - had a lower arterial study done which showed a bilateral ABI suggests no significant lower extremity arterial disease and the toe brachial indicis were normal on the right and abnormal on the left. The right ABI was 1.0 and the left was 1.07. The right toe brachial indices was 0.81 on the right and 0.56 on the left. She had biphasic and triphasic flow at the tibial [name], [name] ([id number]) vessels. 02/24/2017 -- the patient has been tolerating a 3 layer wrap and seems to be pleased with her progress 03/17/17 on evaluation today patient appears to be doing better in regard to her left lateral lower extremity wound.  She has been Tolerating the wraps without complication. No fevers, chills, nausea, or vomiting noted at this time. She is pleased with how things are progressing. Early complaint is that she is unable to take a shower for such a long time from Wednesday all the way till the following Monday. 07/14/17 on evaluation today patient appears to be doing decently well in regard to her left lateral lower extremity ulcer. She has been tolerating the dressing changes without complication. With that being said she does have more irritation noted today compared to last week as far as redness surrounding and up the interior portion of her shin. I do not believe this is pressure at this time. I likely think this is more of a cellulitis type issue although fortunately she does not seem to have any significant discomfort. In these compared to previous. She does have slough overlying the wound bed but otherwise it appears to be doing well. 07/21/17 on evaluation today patient appears to be doing better in regard to her left lateral lower extremity ulcer. She has been tolerating the dressing changes without complication unfortunately she has not been tolerating the antibiotics that I prescribed for her last week. Subsequently she was switch to a different antibiotic by her primary care provider due to a urinary tract infection as well unfortunately she also did not tolerate this antibiotic either. Both calls the diarrhea. The only thing that she knows of that she's done well with in the past is Keflex and amoxicillin alone not Augmentin. Fortunately she has no worsening symptoms and in fact the redness and erythema surrounding her wound on the left lateral lower extremity appears to  be doing better. 07/28/17 on evaluation today patient appears to be doing a little worse in regard to the erythema surrounding her ulcer. With that being said the altar itself did have slough covering although I need to perform any  debridement today at the slough was able to be removed with saline and gauze. Overall the patient feels like the wound looks better although again my concern is erythema surrounding. We have not done a culture since July I think I'm going to today. Patient History Information obtained from Patient. Family History Cancer - Siblings,Child,Maternal Grandparents, Diabetes - Mother,Siblings, Heart Disease - Father, Hypertension - Siblings, Stroke - Father,Mother, Thyroid Problems - Child, No family history of Hereditary Spherocytosis, Kidney Disease, Lung Disease, Seizures, Tuberculosis. Social History Former smoker - smoked late teens early 48s, Marital Status - Widowed, Alcohol Use - Never, Drug Use - No History, Caffeine Use - Daily. Review of Systems (ROS) Constitutional Symptoms (General Health) Denies complaints or symptoms of Fever, Chills. Respiratory The patient has no complaints or symptoms. Cardiovascular The patient has no complaints or symptoms. Psychiatric The patient has no complaints or symptoms. [name], [name] ([id number]) Objective Constitutional Well-nourished and well-hydrated in no acute distress. Vitals Time Taken: 2:48 PM, Height: 63 in, Weight: 164.8 lbs, BMI: 29.2, Temperature: 98.2 F, Pulse: 80 bpm, Respiratory Rate: 18 breaths/min, Blood Pressure: 152/92 mmHg. General Notes: BP taken manually Respiratory normal breathing without difficulty. clear to auscultation bilaterally. Cardiovascular regular rate and rhythm with normal S1, S2. Psychiatric this patient is able to make decisions and demonstrates good insight into disease process. Alert and Oriented x 3. pleasant and cooperative. General Notes: Patient's wound was cleaned with saline and gauze and then a wound culture obtained today. We will see what this shows and consider antibiotic therapy depending on the results. With that being said this is somewhat difficult due to the fact that she has a lot of  reactions to medications. Integumentary (Hair, Skin) Wound #1 status is Open. Original cause of wound was Gradually Appeared. The wound is located on the Left,Lateral Lower Leg. The wound measures 2cm length x 2.5cm width x 0.1cm depth; 3.927cm^2 area and 0.393cm^3 volume. There is Fat Layer (Subcutaneous Tissue) Exposed exposed. There is no tunneling or undermining noted. There is a large amount of serous drainage noted. The wound margin is flat and intact. There is small (1-33%) red granulation within the wound bed. There is a large (67-100%) amount of necrotic tissue within the wound bed including Adherent Slough. The periwound skin appearance exhibited: Excoriation, Maceration, Hemosiderin Staining, Erythema. The periwound skin appearance did not exhibit: Callus, Crepitus, Induration, Rash, Scarring, Dry/Scaly, Atrophie Blanche, Cyanosis, Ecchymosis, Mottled, Pallor, Rubor. The surrounding wound skin color is noted with erythema which is circumferential. Periwound temperature was noted as No Abnormality. The periwound has tenderness on palpation. 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 ([id number]) Wound #1 Left,Lateral Lower Leg: Clean wound with wound cleanser. Cleanse wound with mild soap and water May Shower, gently pat wound dry prior to applying new dressing. Other: - pt may take wrap off and shower before nurse comes or before she comes to clinic Anesthetic (add to Medication List): Wound #1 Left,Lateral Lower Leg: Topical Lidocaine 4% cream applied to wound bed prior to debridement (In Clinic  Only). Skin Barriers/Peri-Wound Care: Wound #1 Left,Lateral Lower Leg: Barrier cream Primary Wound Dressing: Wound #1  Left,Lateral Lower Leg: Hydrafera Blue - hydrafera blue ready transfer Secondary Dressing: Wound #1 Left,Lateral Lower Leg: ABD pad Dry Gauze Dressing Change Frequency: Change Dressing Monday, Wednesday, Friday Follow-up Appointments: Return Appointment in 1 week. Edema Control: Wound #1 Left,Lateral Lower Leg: 3 Layer Compression System - Left Lower Extremity - Please wrap 3cm from toes and 3cm from knee. unna to anchor *****DO NOT USE THE COTTON LAYER, PLEASE USE KERLIX INSTEAD***** Elevate legs to the level of the heart and pump ankles as often as possible Additional Orders / Instructions: Wound #1 Left,Lateral Lower Leg: Increase protein intake. Home Health: Wound #1 Left,Lateral Lower Leg: [place] Nurse may visit PRN to address patient s wound care needs. FACE TO FACE ENCOUNTER: MEDICARE and MEDICAID PATIENTS: I certify that this patient is under my care and that I had a face-to-face encounter that meets the physician face-to-face encounter requirements with this patient on this date. The encounter with the patient was in whole or in part for the following MEDICAL CONDITION: (primary reason for [place]) MEDICAL NECESSITY: I certify, that based on my findings, NURSING services are a medically necessary home health service. HOME BOUND STATUS: I certify that my clinical findings support that this patient is homebound (i.e., Due to illness or injury, pt requires aid of supportive devices such as crutches, cane, wheelchairs, walkers, the use of special transportation or the assistance of another person to leave their place of residence. There is a normal inability to leave the home and doing so requires considerable and taxing effort. Other absences are for medical reasons / religious services and are infrequent or of short duration when for other reasons). If current dressing causes regression in wound condition, may D/C ordered dressing  product/s and apply Normal Saline Moist Dressing daily until next [place] / Other MD appointment. [place] of regression in wound condition at [phone]. Please direct any NON-WOUND related issues/requests for orders to patient's Primary Care Physician Medications-please add to medication list.: Wound #1 Left,Lateral Lower Leg: Other: - Vitamin A, Vitamin C, Zinc, MVI Laboratory ordered were: Wound culture routine Radiology ordered were: X-ray, lower leg The following medication(s) was prescribed: lidocaine topical 4 % cream 1 1 cream topical was prescribed at facility [facility] [place] ([id number]) I am going to reinitiate the [facility] Dressing which has previously been attempted though not for very long. We will see how she does with this. Hopefully it will show signs of improvement. With that being said I am sending the wound culture and we also gonna send her for an x-ray of the left lower extremity. We will see what this shows. Please see above for specific wound care orders. We will see patient for re-evaluation in 1 week(s) here in the clinic. If anything worsens or changes patient will contact our office for additional recommendations. Electronic Signature(s) Signed: 07/31/2017 9:06:28 AM By: Worthy Keeler PA-C Entered By: Worthy Keeler on 07/28/2017 17:34:47 [name], [name] ([id number]) -------------------------------------------------------------------------------- ROS/PFSH Details Patient Name: Seaborn, Ahaana F. Date of Service: 07/28/2017 2:15 PM Medical Record Number: [id number] Patient Account Number: [id number] Date of Birth/Sex: [birth year] (84 y.o. Female) Treating RN: Ahmed Prima Primary Care Provider: Halina Maidens Other Clinician: Referring Provider: Halina Maidens Treating Provider/Extender: Melburn Hake,  Weeks in Treatment: 30 Information Obtained From Patient Wound History Do you currently have one or more open woundso  Yes How many open wounds do you currently haveo 1 Approximately how long have you had your woundso 2 weeks How have you been treating your wound(s) until nowo clobetasol Has your wound(s) ever healed and then re-openedo Yes Have you had any lab work done in the past montho No Have you tested positive for an antibiotic resistant organism (MRSA, VRE)o No Have you tested positive for osteomyelitis (bone infection)o Yes Have you had any tests for circulation on your legso Yes [facility] the test [facility] Have you had other problems associated with your woundso Swelling Constitutional Symptoms (General Health) Complaints and Symptoms: Negative for: Fever; Chills Respiratory Complaints and Symptoms: No Complaints or Symptoms Cardiovascular Complaints and Symptoms: No Complaints or Symptoms Endocrine Medical History: Positive for: Type II Diabetes Time with diabetes: 2003 Treated with: Oral agents Psychiatric Complaints and Symptoms: No Complaints or Symptoms Immunizations Pneumococcal Vaccine: Received Pneumococcal Vaccination: Yes Implantable Devices Pall, Summerlyn F. ([id number]) Family and Social History Cancer: Yes - Siblings,Child,Maternal Grandparents; Diabetes: Yes - Mother,Siblings; Heart Disease: Yes - Father; Hereditary Spherocytosis: No; Hypertension: Yes - Siblings; Kidney Disease: No; Lung Disease: No; Seizures: No; Stroke: Yes - Father,Mother; Thyroid Problems: Yes - Child; Tuberculosis: No; Former smoker - smoked late teens early 78s; Marital Status - Widowed; Alcohol Use: Never; Drug Use: No History; Caffeine Use: Daily; Financial Concerns: No; Food, Clothing or Shelter Needs: No; Support System Lacking: No; Transportation Concerns: No; Advanced Directives: No; Patient does not want information on Advanced Directives; Do not resuscitate: No; Living Will: Yes (Not Provided); Medical Power of Attorney: Yes (Not Provided) Physician Affirmation I have reviewed and agree  with the above information. Electronic Signature(s) Signed: 07/31/2017 9:06:28 AM By: Worthy Keeler PA-C Signed: 08/01/2017 4:46:02 PM By: Alric Quan Entered By: Worthy Keeler on 07/28/2017 17:33:53 Rikard, Anaisha F. ([id number]) -------------------------------------------------------------------------------- SuperBill Details Patient Name: Weedman, Jadynn F. Date of Service: 07/28/2017 Medical Record Number: [id number] Patient Account Number: [id number] Date of Birth/Sex: 1925-04-19 (84 y.o. Female) Treating RN: Carolyne Fiscal, Debi Primary Care Provider: Halina Maidens Other Clinician: Referring Provider: Halina Maidens Treating Provider/Extender: Melburn Hake,  Weeks in Treatment: 30 Diagnosis Coding ICD-10 Codes Code Description E11.622 Type 2 diabetes mellitus with other skin ulcer I10 Essential (primary) hypertension I89.0 Lymphedema, not elsewhere classified L97.822 Non-pressure chronic ulcer of other part of left lower leg with fat layer exposed Facility Procedures CPT4 Code: [id number] Description: (Facility Use Only) [phone] - [facility] LT LEG Modifier: Quantity: 1 Physician Procedures CPT4 Code Description: [id number] - WC PHYS LEVEL 3 - EST PT ICD-10 Diagnosis Description E11.622 Type 2 diabetes mellitus with other skin ulcer I10 Essential (primary) hypertension I89.0 Lymphedema, not elsewhere classified L97.822 Non-pressure  chronic ulcer of other part of left lower leg wit Modifier: h fat layer expos Quantity: 1 ed Electronic Signature(s) Signed: 07/31/2017 9:06:28 AM By: Worthy Keeler PA-C Previous Signature: 07/28/2017 4:13:53 PM Version By: Alric Quan Entered By: Worthy Keeler on 07/28/2017 17:35:13

## 2017-08-02 NOTE — Progress Notes (Signed)
[place]

## 2017-08-04 ENCOUNTER — Other Ambulatory Visit: Payer: Self-pay | Admitting: Internal Medicine

## 2017-08-04 ENCOUNTER — Encounter: Payer: Medicare PPO | Admitting: Physician Assistant

## 2017-08-04 DIAGNOSIS — E11622 Type 2 diabetes mellitus with other skin ulcer: Secondary | ICD-10-CM | POA: Diagnosis not present

## 2017-08-04 DIAGNOSIS — N390 Urinary tract infection, site not specified: Secondary | ICD-10-CM

## 2017-08-04 LAB — URINE CULTURE

## 2017-08-04 MED ORDER — CEPHALEXIN 500 MG PO CAPS: 500.0000 mg | ORAL_CAPSULE | Freq: Four times a day (QID) | ORAL | 0 refills | Status: AC

## 2017-08-07 NOTE — Progress Notes (Signed)
COTTERILL, Cortnie F. ([id number]) Visit Report for 08/04/2017 Chief Complaint Document Details Patient Name: Laura [name], [name] Date of Service: 08/04/2017 2:15 PM Medical Record Number: [id number] Patient Account Number: [id number] Date of Birth/Sex: 06-04-1925 (84 y.o. Female) Treating RN: Ahmed Prima Primary Care Provider: Halina Maidens Other Clinician: Referring Provider: Halina Maidens Treating Provider/Extender: Melburn Hake, Corneisha Alvi Weeks in Treatment: 31 Information Obtained from: Patient Chief Complaint Left lower leg cellulitis Electronic Signature(s) Signed: 08/07/2017 8:04:39 AM By: Worthy Keeler PA-C Entered By: Worthy Keeler on 08/04/2017 14:39:03 [name], [name] ([id number]) -------------------------------------------------------------------------------- HPI Details [name], [name] Date of Service: 08/04/2017 2:15 PM Medical Record Number: [id number] Patient Account Number: [id number] Date of Birth/Sex: May 04, 1925 (84 y.o. Female) Treating RN: Carolyne Fiscal, Debi Primary Care Provider: Halina Maidens Other Clinician: Referring Provider: Halina Maidens Treating Provider/Extender: Melburn Hake, Fredrika Canby Weeks in Treatment: 31 History of Present Illness HPI Description: 12/30/16 Patient presents today for initial evaluation concerning an area over her left lateral ankle which she tells me has been intermittent in nature since 2000. However the current opening has been present for about two weeks. She has been tolerating the dressing changes at home with antibiotic ointment but that is really the only treatment that has been initiated at this point. That is other than the oral antibiotics which was Keflex that patient was placed on by her primary care provider prior to being referred to [place]. She does have some discomfort but rates this to be around a 2-3 out of 10 fortunately the redness does not seem to be spreading to any other location as far as her lower extremity is concerned. She does  tell me that in the past antibiotics have been beneficial for her unfortunately the current antibiotics have not been of benefit and no wound culture was performed as of yet. She has previously seen Dr. 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 She does have diabetes and are most recent blood sugars have run between 104 and 106 according to patient's although her last hemoglobin A1c she is unaware of. Patient's current wound does not appear to be significantly open but is rather more of a weeping region of cellulitis. 01/27/17 on evaluation today patient left lower for many wound continues to show signs of erythema surrounding and in fact she is noted to have erythema from her toes up to just below the knee in regard to left lower extremity. She is not having any fevers, chills, nausea, vomiting, or diarrhea at this point. With that being said she is concerned about the silver alginate dressing. She tells me that in the past she used silver and some of the dressings for previous one and did not do well with it. Nonetheless she has discomfort along the wound itself but no other pain noted throughout the left lower extremity. 02/03/17 on evaluation today patient appears to be doing fairly well overall other than the fact that her wound is worsening on the lateral aspect of her left lower extremity. It appears more macerated and even slough covered at this point. With that being said she has been attempting to perform the dressing changes on her own although I'm not sure that she is understanding exactly how this should be done. Nonetheless I do believe the gentamicin may be causing too much maceration she really has not  wanted to utilize the silver alginate dressings past due to a previous issue with this. Overall her erythema  of the left lower extremity is improved she still has significant swelling. No fevers, chills, nausea, or vomiting noted at this time. 02/10/2017 -- Old notes received -- was seen in 2009 by [facility] by Dr. Romie Levee -- his impression was chronic patches of dermatitis on the left lower extremity and current mild swelling. He recommended venous insufficiency venous reflux studies. She had no evidence of arterial insufficiency. Follow-up chronic venous insufficiency study done on 12/16/2007, showed very mild amount of reflux on the great saphenous vein at the knee but the function of the vein is normal and there was no evidence by the venous reflux study.. They referred her to dermatology for a second opinion and would see her back in about 4-6 weeks. She was seen back in follow-up in January 01 2008 and at that time she was asked to use a steroid ointment locally and the wounds are almost completely healed. The patient has told me today that she does not tolerate silver dressing -- and she has not had home health come and change her dressings over the last week. 02/17/2017 -- - had a lower arterial study done which showed a bilateral ABI suggests no significant lower extremity arterial disease and the toe brachial indicis were normal on the right and abnormal on the left. The right ABI was 1.0 and the left was 1.07. The right toe brachial indices was 0.81 on the right and 0.56 on the left. She had biphasic and triphasic flow at the tibial vessels. 02/24/2017 -- the patient has been tolerating a 3 layer wrap and seems to be pleased with her progress 03/17/17 on evaluation today patient appears to be doing better in regard to her left lateral lower extremity wound. She has been Tolerating the wraps without complication. No fevers, chills, nausea, or vomiting noted at this time. She is pleased with Laura [name], [name] ([id number]) how things are progressing. Early complaint is that she is unable to take  a shower for such a long time from Wednesday all the way till the following Monday. 07/14/17 on evaluation today patient appears to be doing decently well in regard to her left lateral lower extremity ulcer. She has been tolerating the dressing changes without complication. With that being said she does have more irritation noted today compared to last week as far as redness surrounding and up the interior portion of her shin. I do not believe this is pressure at this time. I likely think this is more of a cellulitis type issue although fortunately she does not seem to have any significant discomfort. In these compared to previous. She does have slough overlying the wound bed but otherwise it appears to be doing well. 07/21/17 on evaluation today patient appears to be doing better in regard to her left lateral lower extremity ulcer. She has been tolerating the dressing changes without complication unfortunately she has not been tolerating the antibiotics that I prescribed for her last week. Subsequently she was switch to a different antibiotic by her primary care provider due to a urinary tract infection as well unfortunately she also did not tolerate this antibiotic either. Both calls the diarrhea. The only thing that she knows of that she's done well with in the past is Keflex and amoxicillin alone not Augmentin. Fortunately she has no worsening symptoms and in fact the redness and erythema surrounding her wound on the  left lateral lower extremity appears to be doing better. 07/28/17 on evaluation today patient appears to be doing a little worse in regard to the erythema surrounding her ulcer. With that being said the altar itself did have slough covering although I need to perform any debridement today at the slough was able to be removed with saline and gauze. Overall the patient feels like the wound looks better although again my concern is erythema surrounding. We have not done a culture since  July I think I'm going to today. 08/04/17 on evaluation today patient appears to be very frustrated with her current situation in regard to her left lateral lower extremity ulcer. We did perform a culture which revealed no organisms grown and this was a sufficient culture taken last week during her visit. Subsequently we also did an x-ray which was negative with no evidence of deeper infection such as osteomyelitis at this point. She is concerned with the fact that she has been dealing with this wound for such a long time and again I agree that she has been going through a lot as far as to the wound is concerned trying to get this to heal. With that being said I do think that she is that a pretty good place with the wound seeming to be somewhat better at this point compared to what it has been in the past. Nonetheless she is still frustrated and did state that she was looking at getting a second opinion from Dr. Ola Spurr who is an infectious disease specialist here in town. I explained that I am definitely in favor of second opinions that I have no problems with her going for a second opinion. However I'm unsure if Dr. Ola Spurr will feel he has anything to offer as all for testing seem to indicate no evidence of infection. Nonetheless she thinks that she may go through her primary care provider to see about a referral to Dr. Ola Spurr and again I am okay with that. With that being said there does not appear to be any evidence of infection on evaluation today she has been tolerating the dressing changes without complication I do think the [facility] Dressing is of benefit for her. She does not seem to have as much [facility] as she's had in the past which is also good news. She continues to tolerate the compression wraps that we are utilizing at this point. Electronic Signature(s) Signed: 08/07/2017 8:04:39 AM By: Worthy Keeler PA-C Entered By: Worthy Keeler on 08/04/2017 16:52:59 Laura [name], Laura  F. ([id number]) -------------------------------------------------------------------------------- Physical Exam Details Patient Name: Naramore, Jadelyn F. Date of Service: 08/04/2017 2:15 PM Medical Record Number: [id number] Patient Account Number: [id number] Date of Birth/Sex: June 12, 1925 (84 y.o. Female) Treating RN: Ahmed Prima Primary Care Provider: Halina Maidens Other Clinician: Referring Provider: Halina Maidens Treating Provider/Extender: STONE III, Lisaann Atha Weeks in Treatment: 29 Constitutional Well-nourished and well-hydrated in no acute distress. Respiratory normal breathing without difficulty. clear to auscultation bilaterally. Cardiovascular regular rate and rhythm with normal S1, S2. 1+ pitting edema of the bilateral lower extremities. Psychiatric this patient is able to make decisions and demonstrates good insight into disease process. Alert and Oriented x 3. patient is agitated. Notes Patient's wound does not show slough buildup and in fact anything that was on the surface actually cleaned off rather nicely today and there was no evidence or need for debridement sharply at this point. Patient really does not want any further debridement anyway which is okay although even more importantly this  seems to be doing well enough that I do not think that's necessary. Electronic Signature(s) Signed: 08/07/2017 8:04:39 AM By: Worthy Keeler PA-C Entered By: Worthy Keeler on 08/04/2017 16:54:02 Laura [name], [name] ([id number]) -------------------------------------------------------------------------------- Physician Orders Details Patient Name: Geraghty, Alayzha F. Date of Service: 08/04/2017 2:15 PM Medical Record Number: [id number] Patient Account Number: [id number] Date of Birth/Sex: September 17, 1924 (84 y.o. Female) Treating RN: Carolyne Fiscal, Debi Primary Care Provider: Halina Maidens Other Clinician: Referring Provider: Halina Maidens Treating Provider/Extender: Melburn Hake, Rhianna Raulerson Weeks in Treatment:  19 Verbal / Phone Orders: Yes Clinician: Carolyne Fiscal, Debi Read Back and Verified: Yes Diagnosis Coding ICD-10 Coding Code Description E11.622 Type 2 diabetes mellitus with other skin ulcer I10 Essential (primary) hypertension I89.0 Lymphedema, not elsewhere classified L97.822 Non-pressure chronic ulcer of other part of left lower leg with fat layer exposed Wound Cleansing Wound #1 Left,Lateral Lower Leg o Clean wound with wound cleanser. o Cleanse wound with mild soap and water o May Shower, gently pat wound dry prior to applying new dressing. o Other: - pt may take wrap off and shower before nurse comes or before she comes to clinic Anesthetic (add to Medication List) Wound #1 Left,Lateral Lower Leg o Topical Lidocaine 4% cream applied to wound bed prior to debridement (In Clinic Only). Skin Barriers/Peri-Wound Care Wound #1 Left,Lateral Lower Leg o Barrier cream Primary Wound Dressing Wound #1 Left,Lateral Lower Leg o Hydrafera Blue - hydrafera blue ready transfer Secondary Dressing Wound #1 Left,Lateral Lower Leg o ABD pad o Dry Gauze Dressing Change Frequency o Change Dressing Monday, Wednesday, Friday Follow-up Appointments o Return Appointment in 1 week. Edema Control Wound #1 Left,Lateral Lower Leg o 3 Layer Compression System - Left Lower Extremity - Please wrap 3cm from toes and 3cm from knee. unna to anchor *****DO NOT USE THE COTTON LAYER, PLEASE USE KERLIX INSTEAD***** Laura [name], [name] o Elevate legs to the level of the heart and pump ankles as often as possible Additional Orders / Instructions Wound #1 Left,Lateral Lower Leg o Increase protein intake. Home Health Wound #1 [place] Nurse may visit PRN to address patientos wound care needs. o FACE TO FACE ENCOUNTER: MEDICARE and MEDICAID PATIENTS: I certify that this patient is under my care and that I had a  face-to-face encounter that meets the physician face-to-face encounter requirements with this patient on this date. The encounter with the patient was in whole or in part for the following MEDICAL CONDITION: (primary reason for Laura [name]) MEDICAL NECESSITY: I certify, that based on my findings, NURSING services are a medically necessary home health service. HOME BOUND STATUS: I certify that my clinical findings support that this patient is homebound (i.e., Due to illness or injury, pt requires aid of supportive devices such as crutches, cane, wheelchairs, walkers, the use of special transportation or the assistance of another person to leave their place of residence. There is a normal inability to leave the home and doing so requires considerable and taxing effort. Other absences are for medical reasons / religious services and are infrequent or of short duration when for other reasons). o If current dressing causes regression in wound condition, may D/C ordered dressing product/s and apply Normal Saline Moist Dressing daily until next [place] / Other MD appointment. Laura [name] of regression in wound condition at [phone]. o Please direct any NON-WOUND related issues/requests for orders to patient's Primary Care Physician Medications-please add to medication list. Wound #  1 Left,Lateral Lower Leg o Other: - Vitamin A, Vitamin C, Zinc, MVI Patient Medications Allergies: Augmentin, calcium channel blockers, nitrofurantoin, ACE Inhibitors, atorvastatin, Beta-Blockers (Beta-Adrenergic Blocking Agts), Levaquin, pravastatin, Statins-Hmg-Coa Reductase Inhibitors, sulfa, clindamycin, lincomycin, SILVER, doxycycline Notifications Medication Indication Start End lidocaine DOSE 1 - topical 4 % cream - 1 cream topical Electronic Signature(s) Signed: 08/04/2017 4:55:35 PM By: Alric Quan Signed: 08/07/2017 8:04:39 AM By: Worthy Keeler PA-C Entered By:  Alric Quan on 08/04/2017 15:13:10 [name], [name] ([id number]) -------------------------------------------------------------------------------- Prescription 08/04/2017 Patient Name: [name], Cariann F. Provider: Worthy Keeler PA-C Date of Birth: 11-Sep-1924 NPI#: [id number] Sex: F DEA#: [id number] Phone #: [phone] License #: Patient Address: [facility] [place], [place] [address] [address], [place], [place] [address] [phone] Allergies Augmentin calcium channel blockers nitrofurantoin ACE Inhibitors atorvastatin Beta-Blockers (Beta-Adrenergic Blocking Agts) Levaquin pravastatin Statins-Hmg-Coa Reductase Inhibitors sulfa clindamycin lincomycin SILVER doxycycline Medication Shoults, Keyerra F. ([id number]) Medication: Route: Strength: Form: lidocaine topical 4% cream Class: TOPICAL LOCAL ANESTHETICS Dose: Frequency / Time: Indication: 1 1 cream topical Number of Refills: Number of Units: 0 Generic Substitution: Start Date: End Date: Administered at [place]: Yes Time Administered: Time Discontinued: Note to Pharmacy: Signature(s): Date(s): Electronic Signature(s) Signed: 08/04/2017 4:55:35 PM By: Alric Quan Signed: 08/07/2017 8:04:39 AM By: Worthy Keeler PA-C Entered By: Alric Quan on 08/04/2017 15:13:11 Riddles, Lexany F. ([id number]) --------------------------------------------------------------------------------  Problem List Details Patient Name: Gaertner, Shonna F. Date of Service: 08/04/2017 2:15 PM Medical Record Number: [id number] Patient Account Number: [id number] Date of Birth/Sex: March 16, 1925 (84 y.o. Female) Treating RN: Carolyne Fiscal, Debi Primary Care Provider: Halina Maidens Other Clinician: Referring Provider: Halina Maidens Treating Provider/Extender: Melburn Hake, Janelis Stelzer Weeks in Treatment: 87 Active Problems ICD-10 Encounter Code  Description Active Date Diagnosis E11.622 Type 2 diabetes mellitus with other skin ulcer 01/02/2017 Yes I10 Essential (primary) hypertension 01/02/2017 Yes I89.0 Lymphedema, not elsewhere classified 01/16/2017 Yes L97.822 Non-pressure chronic ulcer of other part of left lower leg with fat 06/29/2017 Yes layer exposed Inactive Problems Resolved Problems ICD-10 Code Description Active Date Resolved Date L03.116 Cellulitis of left lower limb 01/02/2017 01/02/2017 Electronic Signature(s) Signed: 08/07/2017 8:04:39 AM By: Worthy Keeler PA-C Entered By: Worthy Keeler on 08/04/2017 14:38:55 Rarick, Tamanika F. ([id number]) -------------------------------------------------------------------------------- Progress Note Details Patient Name: Burrous, Molina F. Date of Service: 08/04/2017 2:15 PM Medical Record Number: [id number] Patient Account Number: [id number] Date of Birth/Sex: 07-12-1924 (84 y.o. Female) Treating RN: Carolyne Fiscal, Debi Primary Care Provider: Halina Maidens Other Clinician: Referring Provider: Halina Maidens Treating Provider/Extender: Melburn Hake, Patsy Varma Weeks in Treatment: 31 Subjective Chief Complaint Information obtained from Patient Left lower leg cellulitis History of Present Illness (HPI) 12/30/16 Patient presents today for initial evaluation concerning an area over her left lateral ankle which she tells me has been intermittent in nature since 2000. However the current opening has been present for about two weeks. She has been tolerating the dressing changes at home with antibiotic ointment but that is really the only treatment that has been initiated at this point. That is other than the oral antibiotics which was Keflex that patient was placed on by her primary care provider prior to being referred to [place]. She does have some discomfort but rates this to be around a 2-3 out of 10 fortunately the redness does not seem to be spreading to any other location as far as her lower extremity is  concerned. She does tell me that in the past  antibiotics have been beneficial for her unfortunately the current antibiotics have not been of benefit and no wound culture was performed as of yet. She has previously seen Dr. 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 She does have diabetes and are most recent blood sugars have run between 104 and 106 according to patient's although her last hemoglobin A1c she is unaware of. Patient's current wound does not appear to be significantly open but is rather more of a weeping region of cellulitis. 01/27/17 on evaluation today patient left lower for many wound continues to show signs of erythema surrounding and in fact she is noted to have erythema from her toes up to just below the knee in regard to left lower extremity. She is not having any fevers, chills, nausea, vomiting, or diarrhea at this point. With that being said she is concerned about the silver alginate dressing. She tells me that in the past she used silver and some of the dressings for previous one and did not do well with it. Nonetheless she has discomfort along the wound itself but no other pain noted throughout the left lower extremity. 02/03/17 on evaluation today patient appears to be doing fairly well overall other than the fact that her wound is worsening on the lateral aspect of her left lower extremity. It appears more macerated and even slough covered at this point. With that being said she has been attempting to perform the dressing changes on her own although I'm not sure that she is understanding exactly how this should be done. Nonetheless I do believe the gentamicin may be causing too much maceration she really has not wanted to utilize the silver alginate dressings past due to a previous issue with this.  Overall her erythema of the left lower extremity is improved she still has significant swelling. No fevers, chills, nausea, or vomiting noted at this time. 02/10/2017 -- Old notes received -- was seen in 2009 by [facility] by Dr. Romie Levee -- his impression was chronic patches of dermatitis on the left lower extremity and current mild swelling. He recommended venous insufficiency venous reflux studies. She had no evidence of arterial insufficiency. Follow-up chronic venous insufficiency study done on 12/16/2007, showed very mild amount of reflux on the great saphenous vein at the knee but the function of the vein is normal and there was no evidence by the venous reflux study.. They referred her to dermatology for a second opinion and would see her back in about 4-6 weeks. She was seen back in follow-up in January 01 2008 and at that time she was asked to use a steroid ointment locally and the wounds are almost completely healed. The patient has told me today that she does not tolerate silver dressing -- and she has not had home health come and change her dressings over the last week. 02/17/2017 -- - had a lower arterial study done which showed a bilateral ABI suggests no significant lower extremity arterial disease and the toe brachial indicis were normal on the right and abnormal on the left. The right ABI was 1.0 and the left was 1.07. The right toe brachial indices was 0.81 on the right and 0.56 on the left. She had biphasic and triphasic flow at the tibial Alvelo, Rondia F. ([id number]) vessels. 02/24/2017 -- the patient has been  tolerating a 3 layer wrap and seems to be pleased with her progress 03/17/17 on evaluation today patient appears to be doing better in regard to her left lateral lower extremity wound. She has been Tolerating the wraps without complication. No fevers, chills, nausea, or vomiting noted at this time. She is pleased with how things are progressing. Early complaint is that  she is unable to take a shower for such a long time from Wednesday all the way till the following Monday. 07/14/17 on evaluation today patient appears to be doing decently well in regard to her left lateral lower extremity ulcer. She has been tolerating the dressing changes without complication. With that being said she does have more irritation noted today compared to last week as far as redness surrounding and up the interior portion of her shin. I do not believe this is pressure at this time. I likely think this is more of a cellulitis type issue although fortunately she does not seem to have any significant discomfort. In these compared to previous. She does have slough overlying the wound bed but otherwise it appears to be doing well. 07/21/17 on evaluation today patient appears to be doing better in regard to her left lateral lower extremity ulcer. She has been tolerating the dressing changes without complication unfortunately she has not been tolerating the antibiotics that I prescribed for her last week. Subsequently she was switch to a different antibiotic by her primary care provider due to a urinary tract infection as well unfortunately she also did not tolerate this antibiotic either. Both calls the diarrhea. The only thing that she knows of that she's done well with in the past is Keflex and amoxicillin alone not Augmentin. Fortunately she has no worsening symptoms and in fact the redness and erythema surrounding her wound on the left lateral lower extremity appears to be doing better. 07/28/17 on evaluation today patient appears to be doing a little worse in regard to the erythema surrounding her ulcer. With that being said the altar itself did have slough covering although I need to perform any debridement today at the slough was able to be removed with saline and gauze. Overall the patient feels like the wound looks better although again my concern is erythema surrounding. We have not  done a culture since July I think I'm going to today. 08/04/17 on evaluation today patient appears to be very frustrated with her current situation in regard to her left lateral lower extremity ulcer. We did perform a culture which revealed no organisms grown and this was a sufficient culture taken last week during her visit. Subsequently we also did an x-ray which was negative with no evidence of deeper infection such as osteomyelitis at this point. She is concerned with the fact that she has been dealing with this wound for such a long time and again I agree that she has been going through a lot as far as to the wound is concerned trying to get this to heal. With that being said I do think that she is that a pretty good place with the wound seeming to be somewhat better at this point compared to what it has been in the past. Nonetheless she is still frustrated and did state that she was looking at getting a second opinion from Dr. Ola Spurr who is an infectious disease specialist here in town. I explained that I am definitely in favor of second opinions that I have no problems with her going for a second opinion.  However I'm unsure if Dr. Ola Spurr will feel he has anything to offer as all for testing seem to indicate no evidence of infection. Nonetheless she thinks that she may go through her primary care provider to see about a referral to Dr. Ola Spurr and again I am okay with that. With that being said there does not appear to be any evidence of infection on evaluation today she has been tolerating the dressing changes without complication I do think the [facility] Dressing is of benefit for her. She does not seem to have as much [facility] as she's had in the past which is also good news. She continues to tolerate the compression wraps that we are utilizing at this point. Patient History Information obtained from Patient. Family History Cancer - Siblings,Child,Maternal Grandparents,  Diabetes - Mother,Siblings, Heart Disease - Father, Hypertension - Siblings, Stroke - Father,Mother, Thyroid Problems - Child, No family history of Hereditary Spherocytosis, Kidney Disease, Lung Disease, Seizures, Tuberculosis. Social History Former smoker - smoked late teens early 79s, Marital Status - Widowed, Alcohol Use - Never, Drug Use - No History, Caffeine Use - Daily. Review of Systems (ROS) Constitutional Symptoms (General Health) Navarette, Ayahna F. ([id number]) Denies complaints or symptoms of Fever, Chills. Respiratory The patient has no complaints or symptoms. Cardiovascular Complains or has symptoms of LE edema. Psychiatric The patient has no complaints or symptoms. Objective Constitutional Well-nourished and well-hydrated in no acute distress. Vitals Time Taken: 2:30 PM, Height: 63 in, Weight: 164.8 lbs, BMI: 29.2, Temperature: 98.2 F, Pulse: 70 bpm, Respiratory Rate: 18 breaths/min, Blood Pressure: 150/75 mmHg. Respiratory normal breathing without difficulty. clear to auscultation bilaterally. Cardiovascular regular rate and rhythm with normal S1, S2. 1+ pitting edema of the bilateral lower extremities. Psychiatric this patient is able to make decisions and demonstrates good insight into disease process. Alert and Oriented x 3. patient is agitated. General Notes: Patient's wound does not show slough buildup and in fact anything that was on the surface actually cleaned off rather nicely today and there was no evidence or need for debridement sharply at this point. Patient really does not want any further debridement anyway which is okay although even more importantly this seems to be doing well enough that I do not think that's necessary. Integumentary (Hair, Skin) Wound #1 status is Open. Original cause of wound was Gradually Appeared. The wound is located on the Left,Lateral Lower Leg. The wound measures 2.1cm length x 2.6cm width x 0.1cm depth; 4.288cm^2 area and  0.429cm^3 volume. There is Fat Layer (Subcutaneous Tissue) Exposed exposed. There is no tunneling or undermining noted. There is a large amount of serosanguineous drainage noted. The wound margin is flat and intact. There is medium (34-66%) red granulation within the wound bed. There is a medium (34-66%) amount of necrotic tissue within the wound bed including Adherent Slough. The periwound skin appearance exhibited: Excoriation, Maceration, Hemosiderin Staining, Erythema. The periwound skin appearance did not exhibit: Callus, Crepitus, Induration, Rash, Scarring, Dry/Scaly, Atrophie Blanche, Cyanosis, Ecchymosis, Mottled, Pallor, Rubor. The surrounding wound skin color is noted with erythema which is circumferential. Periwound temperature was noted as No Abnormality. The periwound has tenderness on palpation. Assessment Buchta, Kijuana F. 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 Cleanse wound  with mild soap and water May Shower, gently pat wound dry prior to applying new dressing. Other: - pt may take wrap off and shower before nurse comes or before she comes to clinic Anesthetic (add to Medication List): Wound #1 Left,Lateral Lower Leg: Topical Lidocaine 4% cream applied to wound bed prior to debridement (In Clinic Only). Skin Barriers/Peri-Wound Care: Wound #1 Left,Lateral Lower Leg: Barrier cream Primary Wound Dressing: Wound #1 Left,Lateral Lower Leg: Hydrafera Blue - hydrafera blue ready transfer Secondary Dressing: Wound #1 Left,Lateral Lower Leg: ABD pad Dry Gauze Dressing Change Frequency: Change Dressing Monday, Wednesday, Friday Follow-up Appointments: Return  Appointment in 1 week. Edema Control: Wound #1 Left,Lateral Lower Leg: 3 Layer Compression System - Left Lower Extremity - Please wrap 3cm from toes and 3cm from knee. unna to anchor *****DO NOT USE THE COTTON LAYER, PLEASE USE KERLIX INSTEAD***** Elevate legs to the level of the heart and pump ankles as often as possible Additional Orders / Instructions: Wound #1 Left,Lateral Lower Leg: Increase protein intake. Home Health: Wound #1 Left,Lateral Lower Leg: [place] Nurse may visit PRN to address patient s wound care needs. FACE TO FACE ENCOUNTER: MEDICARE and MEDICAID PATIENTS: I certify that this patient is under my care and that I had a face-to-face encounter that meets the physician face-to-face encounter requirements with this patient on this date. The encounter with the patient was in whole or in part for the following MEDICAL CONDITION: (primary reason for [place]) MEDICAL NECESSITY: I certify, that based on my findings, NURSING services are a medically necessary home health service. HOME BOUND STATUS: I certify that my clinical findings support that this patient is homebound (i.e., Due to illness or injury, pt requires aid of supportive devices such as crutches, cane, wheelchairs, walkers, the use of special transportation or the assistance of another person to leave their place of residence. There is a normal inability to leave the home and doing so requires considerable and taxing effort. Other absences are for medical reasons / religious services and are infrequent or of short duration when for other reasons). Haddon, Merian F. ([id number]) If current dressing causes regression in wound condition, may D/C ordered dressing product/s and apply Normal Saline Moist Dressing daily until next [place] / Other MD appointment. [place] of regression in wound condition at [phone]. Please direct any NON-WOUND related  issues/requests for orders to patient's Primary Care Physician Medications-please add to medication list.: Wound #1 Left,Lateral Lower Leg: Other: - Vitamin A, Vitamin C, Zinc, MVI The following medication(s) was prescribed: lidocaine topical 4 % cream 1 1 cream topical was prescribed at facility I am going to recommend at this point that we continue with the Current wound care measures for the next week. I suggested that patient follow-up with her primary and see about transitioning under their care in regard to the wound if that is what she would prefer to do I am definitely okay with that. I'm also okay with any second opinions that she would like to search out I never again sat and again I do think it can be of benefit getting other eyes on the situation especially if things seem to have stalled. We will see were things stand in one weeks time and what she would like to do at that point based on how she is feeling. Patient is in agreement with the plan. Please see above for specific wound care orders. We will see patient for re-evaluation  in 1 week(s) here in the clinic. If anything worsens or changes patient will contact our office for additional recommendations. Electronic Signature(s) Signed: 08/07/2017 8:04:39 AM By: Worthy Keeler PA-C Entered By: Worthy Keeler on 08/04/2017 16:55:00 Martian, Lindie F. ([id number]) -------------------------------------------------------------------------------- ROS/PFSH Details Patient Name: Mcnicholas, Kourtlynn F. Date of Service: 08/04/2017 2:15 PM Medical Record Number: [id number] Patient Account Number: [id number] Date of Birth/Sex: 09/25/1924 (84 y.o. Female) Treating RN: Carolyne Fiscal, Debi Primary Care Provider: Halina Maidens Other Clinician: Referring Provider: Halina Maidens Treating Provider/Extender: Melburn Hake, Roneka Gilpin Weeks in Treatment: 31 Information Obtained From Patient Wound History Do you currently have one or more open woundso Yes How many open  wounds do you currently haveo 1 Approximately how long have you had your woundso 2 weeks How have you been treating your wound(s) until nowo clobetasol Has your wound(s) ever healed and then re-openedo Yes Have you had any lab work done in the past montho No Have you tested positive for an antibiotic resistant organism (MRSA, VRE)o No Have you tested positive for osteomyelitis (bone infection)o Yes Have you had any tests for circulation on your legso Yes Where [facility] the test [facility] Have you had other problems associated with your woundso Swelling Constitutional Symptoms (General Health) Complaints and Symptoms: Negative for: Fever; Chills Cardiovascular Complaints and Symptoms: Positive for: LE edema Respiratory Complaints and Symptoms: No Complaints or Symptoms Endocrine Medical History: Positive for: Type II Diabetes Time with diabetes: 2003 Treated with: Oral agents Psychiatric Complaints and Symptoms: No Complaints or Symptoms Immunizations Pneumococcal Vaccine: Received Pneumococcal Vaccination: Yes Implantable Devices Fenn, Arelie F. ([id number]) Family and Social History Cancer: Yes - Siblings,Child,Maternal Grandparents; Diabetes: Yes - Mother,Siblings; Heart Disease: Yes - Father; Hereditary Spherocytosis: No; Hypertension: Yes - Siblings; Kidney Disease: No; Lung Disease: No; Seizures: No; Stroke: Yes - Father,Mother; Thyroid Problems: Yes - Child; Tuberculosis: No; Former smoker - smoked late teens early 23s; Marital Status - Widowed; Alcohol Use: Never; Drug Use: No History; Caffeine Use: Daily; Financial Concerns: No; Food, Clothing or Shelter Needs: No; Support System Lacking: No; Transportation Concerns: No; Advanced Directives: No; Patient does not want information on Advanced Directives; Do not resuscitate: No; Living Will: Yes (Not Provided); Medical Power of Attorney: Yes (Not Provided) Physician Affirmation I have reviewed and agree with the above  information. Electronic Signature(s) Signed: 08/04/2017 4:55:35 PM By: Alric Quan Signed: 08/07/2017 8:04:39 AM By: Worthy Keeler PA-C Entered By: Worthy Keeler on 08/04/2017 16:53:30 Lamping, Rilei F. ([id number]) -------------------------------------------------------------------------------- SuperBill Details Patient Name: Mcguiness, Kailene F. Date of Service: 08/04/2017 Medical Record Number: [id number] Patient Account Number: [id number] Date of Birth/Sex: 1924-11-10 (84 y.o. Female) Treating RN: Carolyne Fiscal, Debi Primary Care Provider: Halina Maidens Other Clinician: Referring Provider: Halina Maidens Treating Provider/Extender: Melburn Hake, Margarie Mcguirt Weeks in Treatment: 31 Diagnosis Coding ICD-10 Codes Code Description E11.622 Type 2 diabetes mellitus with other skin ulcer I10 Essential (primary) hypertension I89.0 Lymphedema, not elsewhere classified L97.822 Non-pressure chronic ulcer of other part of left lower leg with fat layer exposed Facility Procedures CPT4 Code: [id number] Description: (Facility Use Only) [id number] - [place] LWR LT LEG Modifier: Quantity: 1 Physician Procedures CPT4 Code Description: [id number] - WC PHYS LEVEL 3 - EST PT ICD-10 Diagnosis Description E11.622 Type 2 diabetes mellitus with other skin ulcer I10 Essential (primary) hypertension I89.0 Lymphedema, not elsewhere classified L97.822 Non-pressure  chronic ulcer of other part of left lower leg wit Modifier: h fat layer expos Quantity: 1 ed Electronic Signature(s) Signed: 08/07/2017 8:04:39 AM By:  Worthy Keeler PA-C Previous Signature: 08/04/2017 3:22:26 PM Version By: Alric Quan Entered By: Worthy Keeler on 08/04/2017 16:55:29

## 2017-08-08 NOTE — Progress Notes (Signed)
YIN, Annsley F. ([id number]) Visit Report for 08/04/2017 Arrival Information Details Patient Name: [name], [name] Date of Service: 08/04/2017 2:15 PM Medical Record Number: [id number] Patient Account Number: [id number] Date of Birth/Sex: 04-17-1925 (84 y.o. Female) Treating RN: Carolyne Fiscal, Debi Primary Care Elgene Coral: Halina Maidens Other Clinician: Referring Jemari Hallum: Halina Maidens Treating Jesenia Spera/Extender: Melburn Hake, HOYT Weeks in Treatment: 52 Visit Information History Since Last Visit All ordered tests and consults were completed: No Patient Arrived: Kasandra Knudsen Added or deleted any medications: No Arrival Time: 14:28 Any new allergies or adverse reactions: No Accompanied By: caregiver Had a fall or experienced change in No Transfer Assistance: EasyPivot Patient activities of daily living that may affect Lift risk of falls: Patient Identification Verified: Yes Signs or symptoms of abuse/neglect since last visito No Secondary Verification Process Yes Hospitalized since last visit: No Completed: Has Dressing in Place as Prescribed: Yes Patient Requires Transmission-Based No Precautions: Has Compression in Place as Prescribed: Yes Patient Has Alerts: Yes Pain Present Now: No Patient Alerts: DM II Electronic Signature(s) Signed: 08/04/2017 4:55:35 PM By: Alric Quan Entered By: Alric Quan on 08/04/2017 14:29:43 [name], Jakiah F. ([id number]) -------------------------------------------------------------------------------- Encounter Discharge Information Details Patient Name: Laura Glatter, [name] Date of Service: 08/04/2017 2:15 PM Medical Record Number: [id number] Patient Account Number: [id number] Date of Birth/Sex: 11-11-1924 (84 y.o. Female) Treating RN: Carolyne Fiscal, Debi Primary Care Koa Palla: Halina Maidens Other Clinician: Referring Jomarie Gellis: Halina Maidens Treating Johniece Hornbaker/Extender: Melburn Hake, HOYT Weeks in Treatment: 91 Encounter Discharge Information Items Discharge Pain  Level: 0 Discharge Condition: Stable Ambulatory Status: Cane Discharge Destination: Home Transportation: Private Auto Accompanied By: caregiver Schedule Follow-up Appointment: Yes Medication Reconciliation completed and No provided to Patient/Care Lidya Mccalister: Provided on Clinical Summary of Care: 08/04/2017 Form Type Recipient Paper Patient [facility] Electronic Signature(s) Signed: 08/07/2017 4:58:43 PM By: Ruthine Dose Entered By: Ruthine Dose on 08/04/2017 15:17:53 Prettyman, Carmie F. ([id number]) -------------------------------------------------------------------------------- Lower Extremity Assessment Details Patient Name: Shaler, [name] Date of Service: 08/04/2017 2:15 PM Medical Record Number: [id number] Patient Account Number: [id number] Date of Birth/Sex: 1925/03/24 (84 y.o. Female) Treating RN: Carolyne Fiscal, Debi Primary Care Santosh Petter: Halina Maidens Other Clinician: Referring Jerolene Kupfer: Halina Maidens Treating Kyah Buesing/Extender: Melburn Hake, HOYT Weeks in Treatment: 31 Edema Assessment Assessed: [Left: No] [Right: No] [Left: Edema] [Right: :] Calf Left: Right: Point of Measurement: 34 cm From Medial Instep 32.6 cm cm Ankle Left: Right: Point of Measurement: 10 cm From Medial Instep 21.6 cm cm Vascular Assessment Pulses: Dorsalis Pedis Palpable: [Left:Yes] Posterior Tibial Extremity colors, hair growth, and conditions: Extremity Color: [Left:Red] Temperature of Extremity: [Left:Warm] Capillary Refill: [Left:< 3 seconds] Toe Nail Assessment Left: Right: Thick: No Discolored: No Deformed: No Improper Length and Hygiene: No Electronic Signature(s) Signed: 08/04/2017 4:55:35 PM By: Alric Quan Entered By: Alric Quan on 08/04/2017 14:33:41 Casserly, Estephani F. ([id number]) -------------------------------------------------------------------------------- Multi Wound Chart Details Patient Name: Vanzanten, [name] Date of Service: 08/04/2017 2:15 PM Medical Record Number:  [id number] Patient Account Number: [id number] Date of Birth/Sex: Nov 27, 1924 (84 y.o. Female) Treating RN: Carolyne Fiscal, Debi Primary Care Tausha Milhoan: Halina Maidens Other Clinician: Referring Brandye Inthavong: Halina Maidens Treating Carlee Tesfaye/Extender: Melburn Hake, HOYT Weeks in Treatment: 31 Vital Signs Height(in): 22 Pulse(bpm): 27 Weight(lbs): 164.8 Blood Pressure(mmHg): 150/75 Body Mass Index(BMI): 29 Temperature(F): 98.2 Respiratory Rate 18 (breaths/min): Photos: [1:No Photos] [N/A:N/A] Wound Location: [1:Left Lower Leg - Lateral] [N/A:N/A] Wounding Event: [1:Gradually Appeared] [N/A:N/A] Primary Etiology: [1:Diabetic Wound/Ulcer of the Lower Extremity] [N/A:N/A] Comorbid History: [1:Type II Diabetes] [N/A:N/A] Date Acquired: [1:12/16/2016] [N/A:N/A] Weeks of Treatment: [1:31] [N/A:N/A] Wound Status: [1:Open] [N/A:N/A] Measurements L  x W x D [1:2.1x2.6x0.1] [N/A:N/A] (cm) Area (cm) : [1:4.288] [N/A:N/A] Volume (cm) : [1:0.429] [N/A:N/A] % Reduction in Area: [1:-336.70%] [N/A:N/A] % Reduction in Volume: [1:-337.80%] [N/A:N/A] Classification: [1:Grade 1] [N/A:N/A] Exudate Amount: [1:Large] [N/A:N/A] Exudate Type: [1:Serosanguineous] [N/A:N/A] Exudate Color: [1:red, brown] [N/A:N/A] Wound Margin: [1:Flat and Intact] [N/A:N/A] Granulation Amount: [1:Medium (34-66%)] [N/A:N/A] Granulation Quality: [1:Red] [N/A:N/A] Necrotic Amount: [1:Medium (34-66%)] [N/A:N/A] Exposed Structures: [1:Fat Layer (Subcutaneous Tissue) Exposed: Yes Fascia: No Tendon: No Muscle: No Joint: No Bone: No] [N/A:N/A] Epithelialization: [1:Small (1-33%)] [N/A:N/A] Periwound Skin Texture: [1:Excoriation: Yes Induration: No Callus: No Crepitus: No Rash: No Scarring: No] [N/A:N/A] Periwound Skin Moisture: Maceration: Yes N/A N/A Dry/Scaly: No Periwound Skin Color: Erythema: Yes N/A N/A Hemosiderin Staining: Yes Atrophie Blanche: No Cyanosis: No Ecchymosis: No Mottled: No Pallor: No Rubor: No Erythema  Location: Circumferential N/A N/A Temperature: No Abnormality N/A N/A Tenderness on Palpation: Yes N/A N/A Wound Preparation: Ulcer Cleansing: N/A N/A Rinsed/Irrigated with Saline, Other: soap and water Topical Anesthetic Applied: Other: lidocaine 4% Treatment Notes Electronic Signature(s) Signed: 08/04/2017 4:55:35 PM By: Alric Quan Entered By: Alric Quan on 08/04/2017 14:33:54 Laux, Quynn F. ([id number]) -------------------------------------------------------------------------------- Multi-Disciplinary Care Plan Details Patient Name: Laura Glatter, [name] Date of Service: 08/04/2017 2:15 PM Medical Record Number: [id number] Patient Account Number: [id number] Date of Birth/Sex: 01-07-1925 (84 y.o. Female) Treating RN: Carolyne Fiscal, Debi Primary Care Drenda Sobecki: Halina Maidens Other Clinician: Referring Marlisha Vanwyk: Halina Maidens Treating Deaire Mcwhirter/Extender: Melburn Hake, HOYT Weeks in Treatment: 31 Active Inactive ` Abuse / Safety / Falls / Self Care Management Nursing Diagnoses: Potential for falls Goals: Patient will not experience any injury related to falls Date Initiated: 12/30/2016 Target Resolution Date: 04/29/2017 Goal Status: Active Interventions: Assess Activities of Daily Living upon admission and as needed Assess: immobility, friction, shearing, incontinence upon admission and as needed Notes: ` Nutrition Nursing Diagnoses: Imbalanced nutrition Impaired glucose control: actual or potential Potential for alteratiion in Nutrition/Potential for imbalanced nutrition Goals: Patient/caregiver will maintain therapeutic glucose control Date Initiated: 12/30/2016 Target Resolution Date: 04/29/2017 Goal Status: Active Interventions: Assess patient nutrition upon admission and as needed per policy Notes: ` Orientation to the Wound Care Program Nursing Diagnoses: Knowledge deficit related to the wound healing center program Goals: Patient/caregiver will verbalize  understanding of the [place] Date Initiated: 12/30/2016 Target Resolution Date: 01/21/2017 Swindle, Tanijah Wanda Plump ([id number]) Goal Status: Active Interventions: Provide education on orientation to the wound center Notes: ` Pain, Acute or Chronic Nursing Diagnoses: Pain, acute or chronic: actual or potential Potential alteration in comfort, pain Goals: Patient/caregiver will verbalize adequate pain control between visits Date Initiated: 12/30/2016 Target Resolution Date: 04/29/2017 Goal Status: Active Interventions: Complete pain assessment as per visit requirements Notes: ` Wound/Skin Impairment Nursing Diagnoses: Impaired tissue integrity Knowledge deficit related to smoking impact on wound healing Knowledge deficit related to ulceration/compromised skin integrity Goals: Ulcer/skin breakdown will have a volume reduction of 80% by week 12 Date Initiated: 12/30/2016 Target Resolution Date: 04/22/2017 Goal Status: Active Interventions: Assess patient/caregiver ability to perform ulcer/skin care regimen upon admission and as needed Notes: Electronic Signature(s) Signed: 08/04/2017 4:55:35 PM By: Alric Quan Entered By: Alric Quan on 08/04/2017 14:33:47 Fullam, Paw F. ([id number]) -------------------------------------------------------------------------------- Pain Assessment Details Patient Name: Tirrell, Audry F. Date of Service: 08/04/2017 2:15 PM Medical Record Number: [id number] Patient Account Number: [id number] Date of Birth/Sex: [birth year] (84 y.o. Female) Treating RN: Ahmed Prima Primary Care Plato Alspaugh: Halina Maidens Other Clinician: Referring Dhriti Fales: Halina Maidens Treating Manasseh Pittsley/Extender: Melburn Hake, HOYT Weeks in Treatment: 31 Active Problems Location of Pain  Severity and Description of Pain Patient Has Paino No Site Locations Pain Management and Medication Current Pain Management: Electronic Signature(s) Signed: 08/04/2017 4:55:35 PM By:  Alric Quan Entered By: Alric Quan on 08/04/2017 14:30:00 Mcbane, Daniya F. ([id number]) -------------------------------------------------------------------------------- Patient/Caregiver Education Details Patient Name: Laura Glatter, [name] Date of Service: 08/04/2017 2:15 PM Medical Record Number: [id number] Patient Account Number: [id number] Date of Birth/Gender: 1924/09/08 (84 y.o. Female) Treating RN: Ahmed Prima Primary Care Physician: Halina Maidens Other Clinician: Referring Physician: Halina Maidens Treating Physician/Extender: Melburn Hake, HOYT Weeks in Treatment: 31 Education Assessment Education Provided To: Patient Education Topics Provided Wound/Skin Impairment: Handouts: Caring for Your Ulcer, Other: change ddressing as ordered Methods: Demonstration, Explain/Verbal Responses: State content correctly Electronic Signature(s) Signed: 08/04/2017 4:55:35 PM By: Alric Quan Entered By: Alric Quan on 08/04/2017 15:14:54 Printup, Jadyn F. ([id number]) -------------------------------------------------------------------------------- Wound Assessment Details Patient Name: Hoes, [name] Date of Service: 08/04/2017 2:15 PM Medical Record Number: [id number] Patient Account Number: [id number] Date of Birth/Sex: November 20, 1924 (84 y.o. Female) Treating RN: Carolyne Fiscal, Debi Primary Care Ritesh Opara: Halina Maidens Other Clinician: Referring Boni Maclellan: Halina Maidens Treating Alexine Pilant/Extender: Melburn Hake, HOYT Weeks in Treatment: 31 Wound Status Wound Number: 1 Primary Etiology: Diabetic Wound/Ulcer of the Lower Extremity Wound Location: Left Lower Leg - Lateral Wound Status: Open Wounding Event: Gradually Appeared Comorbid Type II Diabetes Date Acquired: 12/16/2016 History: Weeks Of Treatment: 31 Clustered Wound: No Photos Photo Uploaded By: Alric Quan on 08/04/2017 16:20:31 Wound Measurements Length: (cm) 2.1 Width: (cm) 2.6 Depth: (cm) 0.1 Area: (cm)  4.288 Volume: (cm) 0.429 % Reduction in Area: -336.7% % Reduction in Volume: -337.8% Epithelialization: Small (1-33%) Tunneling: No Undermining: No Wound Description Classification: Grade 1 Wound Margin: Flat and Intact Exudate Amount: Large Exudate Type: Serosanguineous Exudate Color: red, brown Foul Odor After Cleansing: No Slough/Fibrino Yes Wound Bed Granulation Amount: Medium (34-66%) Exposed Structure Granulation Quality: Red Fascia Exposed: No Necrotic Amount: Medium (34-66%) Fat Layer (Subcutaneous Tissue) Exposed: Yes Necrotic Quality: Adherent Slough Tendon Exposed: No Muscle Exposed: No Joint Exposed: No Bone Exposed: No Periwound Skin Texture Finfrock, Ishita F. ([id number]) Texture Color No Abnormalities Noted: No No Abnormalities Noted: No Callus: No Atrophie Blanche: No Crepitus: No Cyanosis: No Excoriation: Yes Ecchymosis: No Induration: No Erythema: Yes Rash: No Erythema Location: Circumferential Scarring: No Hemosiderin Staining: Yes Mottled: No Moisture Pallor: No No Abnormalities Noted: No Rubor: No Dry / Scaly: No Maceration: Yes Temperature / Pain Temperature: No Abnormality Tenderness on Palpation: Yes Wound Preparation Ulcer Cleansing: Rinsed/Irrigated with Saline, Other: soap and water, Topical Anesthetic Applied: Other: lidocaine 4%, Treatment Notes Wound #1 (Left, Lateral Lower Leg) 1. Cleansed with: Clean wound with Normal Saline 2. Anesthetic Topical Lidocaine 4% cream to wound bed prior to debridement 4. Dressing Applied: Hydrafera Blue 5. Secondary Dressing Applied ABD Pad 7. Secured with Tape 3 Layer Compression System - Left Lower Extremity Notes unna to anchor, Manufacturing systems engineer) Signed: 08/04/2017 4:55:35 PM By: Alric Quan Entered By: Alric Quan on 08/04/2017 14:32:19 Cervantes, Jessaca F. ([id number]) -------------------------------------------------------------------------------- Vitals  Details Patient Name: Venezia, [name] Date of Service: 08/04/2017 2:15 PM Medical Record Number: [id number] Patient Account Number: [id number] Date of Birth/Sex: 05-18-1925 (84 y.o. Female) Treating RN: Carolyne Fiscal, Debi Primary Care Xylia Scherger: Halina Maidens Other Clinician: Referring Terrian Ridlon: Halina Maidens Treating Javeah Loeza/Extender: Melburn Hake, HOYT Weeks in Treatment: 31 Vital Signs Time Taken: 14:30 Temperature (F): 98.2 Height (in): 63 Pulse (bpm): 70 Weight (lbs): 164.8 Respiratory Rate (breaths/min): 18 Body Mass Index (BMI): 29.2 Blood Pressure (mmHg): 150/75 Reference Range: 80 -  120 mg / dl Electronic Signature(s) Signed: 08/04/2017 4:55:35 PM By: Alric Quan Entered By: Alric Quan on 08/04/2017 14:30:38

## 2017-08-11 ENCOUNTER — Ambulatory Visit: Payer: Medicare PPO | Admitting: Physician Assistant

## 2017-08-14 ENCOUNTER — Other Ambulatory Visit: Payer: Self-pay | Admitting: Internal Medicine

## 2017-08-14 DIAGNOSIS — H539 Unspecified visual disturbance: Secondary | ICD-10-CM

## 2017-08-18 ENCOUNTER — Ambulatory Visit: Payer: Self-pay | Admitting: Urology

## 2017-08-22 ENCOUNTER — Ambulatory Visit: Payer: Medicare PPO | Admitting: Gastroenterology

## 2017-08-27 ENCOUNTER — Other Ambulatory Visit: Payer: Self-pay | Admitting: Internal Medicine

## 2017-08-27 DIAGNOSIS — I10 Essential (primary) hypertension: Secondary | ICD-10-CM

## 2017-09-06 ENCOUNTER — Other Ambulatory Visit (INDEPENDENT_AMBULATORY_CARE_PROVIDER_SITE_OTHER): Payer: Medicare PPO | Admitting: Internal Medicine

## 2017-09-06 DIAGNOSIS — S98112A Complete traumatic amputation of left great toe, initial encounter: Secondary | ICD-10-CM

## 2017-09-06 DIAGNOSIS — Z7951 Long term (current) use of inhaled steroids: Secondary | ICD-10-CM

## 2017-09-06 DIAGNOSIS — L97821 Non-pressure chronic ulcer of other part of left lower leg limited to breakdown of skin: Secondary | ICD-10-CM

## 2017-09-06 DIAGNOSIS — E1151 Type 2 diabetes mellitus with diabetic peripheral angiopathy without gangrene: Secondary | ICD-10-CM

## 2017-09-06 DIAGNOSIS — I5032 Chronic diastolic (congestive) heart failure: Secondary | ICD-10-CM

## 2017-09-06 DIAGNOSIS — I776 Arteritis, unspecified: Secondary | ICD-10-CM

## 2017-09-06 DIAGNOSIS — R600 Localized edema: Secondary | ICD-10-CM

## 2017-09-06 DIAGNOSIS — L988 Other specified disorders of the skin and subcutaneous tissue: Secondary | ICD-10-CM

## 2017-09-06 DIAGNOSIS — N183 Chronic kidney disease, stage 3 unspecified: Secondary | ICD-10-CM

## 2017-09-06 DIAGNOSIS — E1122 Type 2 diabetes mellitus with diabetic chronic kidney disease: Secondary | ICD-10-CM

## 2017-09-06 NOTE — Progress Notes (Signed)
Received orders for Home health from [place] area. Re certification dates 12/12/61 to 10/12/17. Orders are reviewed, signed and faxed.

## 2017-09-06 NOTE — Progress Notes (Signed)
Received home health orders from [place]. Re certification dates 88/11/03 to 08/13/17.  Orders were reviewed, signed and faxed.

## 2017-09-19 ENCOUNTER — Encounter: Payer: Self-pay | Admitting: Urology

## 2017-09-19 ENCOUNTER — Ambulatory Visit: Payer: Medicare PPO | Admitting: Urology

## 2017-09-19 VITALS — BP 182/107 | HR 85 | Resp 16 | Ht 64.0 in | Wt 160.0 lb

## 2017-09-19 DIAGNOSIS — N390 Urinary tract infection, site not specified: Secondary | ICD-10-CM

## 2017-09-19 NOTE — Progress Notes (Signed)
09/19/2017 4:21 PM   Laura [name] [birth year] [id number]  Referring provider: Glean Hess, MD [address] [place], [place] [address]  Chief complaint: Referred for UTIs  HPI: 84 year old female who in January 2019 developed malodorous urine associated with urinary frequency, hesitancy, urgency and dysuria.  A urinalysis showed pyuria and a urine culture was ordered however apparently never run.  She was started on Omnicef however developed diarrhea which was persistent after changing antibiotics.  She had persistent malodorous urine and pelvic discomfort and a urine culture was run which grew pansensitive E. coli.  She was treated with Keflex which she was able to tolerate.  She still complains of malodorous urine.  Her voiding symptoms have resolved although she does have baseline urgency and urge incontinence.  She was seen here by [facility] in 2017 for recurrent UTI.  She grew Proteus and a renal ultrasound was recommended however was never performed.  Bladder scan for PVR at that visit was 59 mL.  She denies fever, chills or gross hematuria.   PMH: Past Medical History:  Diagnosis Date  . Arthritis   . Cystitis   . Diabetes ([place])   . Heart disease   . HTN (hypertension)     Surgical History: Past Surgical History:  Procedure Laterality Date  . APPENDECTOMY    . bone marrow withdrawal    . colon polyp removal    . [place]  . TOE AMPUTATION Left 08/2013  . TOTAL ABDOMINAL HYSTERECTOMY  1963    Home Medications:  Allergies as of 09/19/2017      Reactions   Augmentin [amoxicillin-pot Clavulanate] Other (See Comments)   Vasculitis   Calcium Channel Blockers Shortness Of Breath   Nitrofurantoin Shortness Of Breath   Ace Inhibitors Cough   Beta Adrenergic Blockers    Other reaction(s): Headache   Levaquin [levofloxacin]    Unknown - extracted from old chart at [facility] [cefdinir] Diarrhea   Statins    weakness   Sulfa Antibiotics    Constipation and Rash   Clindamycin Rash   Doxycycline Hives, Itching      Medication List        Accurate as of 09/19/17  4:21 PM. Always use your most recent med list.          ACCU-CHEK AVIVA PLUS test strip Generic drug:  glucose blood USE ONE STRIP TO CHECK GLUCOSE ONCE DAILY   aspirin 81 MG chewable tablet Chew 1 tablet by mouth daily.   cholecalciferol 1000 units tablet Commonly known as:  VITAMIN D Take 1,000 Units by mouth daily.   clobetasol ointment 0.05 % Commonly known as:  TEMOVATE CLOBETASOL PROPIONATE, 0.05% (External Ointment) - Historical Medication  appication two times daily (0.05 %) Active   cloNIDine 0.2 MG tablet Commonly known as:  CATAPRES TAKE 1 TABLET BY MOUTH TWICE DAILY   COLLAGEN-ANTIMICROBIAL EX Apply topically.   fexofenadine 180 MG tablet Commonly known as:  ALLEGRA Take 1 tablet by mouth daily as needed.   ibuprofen 200 MG tablet Commonly known as:  ADVIL,MOTRIN Take 400 mg by mouth 2 (two) times daily.   metFORMIN 500 MG tablet Commonly known as:  GLUCOPHAGE Take 1 tablet (500 mg total) by mouth daily.   mometasone 50 MCG/ACT nasal spray Commonly known as:  NASONEX Place 2 sprays into the nose daily as needed.   telmisartan 80 MG tablet Commonly known as:  MICARDIS Take 1 tablet  by mouth daily.   triamterene-hydrochlorothiazide 37.5-25 MG tablet Commonly known as:  MAXZIDE-25 Take 1 tablet by mouth daily.   triamterene-hydrochlorothiazide 37.5-25 MG tablet Commonly known as:  MAXZIDE-25 TAKE 1 TABLET BY MOUTH ONCE DAILY       Allergies:  Allergies  Allergen Reactions  . Augmentin [Amoxicillin-Pot Clavulanate] Other (See Comments)    Vasculitis  . Calcium Channel Blockers Shortness Of Breath  . Nitrofurantoin Shortness Of Breath  . Ace Inhibitors Cough  . Beta Adrenergic Blockers     Other reaction(s): Headache  . Levaquin [Levofloxacin]     Unknown - extracted from old chart at [facility]  . Omnicef [Cefdinir] Diarrhea  . Statins     weakness  . Sulfa Antibiotics     Constipation and Rash  . Clindamycin Rash  . Doxycycline Hives and Itching    Family History: Family History  Problem Relation Age of Onset  . Diabetes Mother   . Stroke Mother   . Hypertension Father   . Kidney disease Neg Hx   . Bladder Cancer Neg Hx     Social History:  reports that she has never smoked. She has never used smokeless tobacco. She reports that she does not drink alcohol or use drugs.  ROS: UROLOGY Frequent Urination?: No Hard to postpone urination?: Yes Burning/pain with urination?: No Get up at night to urinate?: Yes Leakage of urine?: Yes Urine stream starts and stops?: No Trouble starting stream?: No Do you have to strain to urinate?: No Blood in urine?: No Urinary tract infection?: No Sexually transmitted disease?: No Injury to kidneys or bladder?: No Painful intercourse?: No Weak stream?: No Currently pregnant?: No Vaginal bleeding?: No Last menstrual period?: n  Gastrointestinal Nausea?: No Vomiting?: No Indigestion/heartburn?: No Diarrhea?: No Constipation?: No  Constitutional Fever: No Night sweats?: No Weight loss?: No Fatigue?: No  Skin Skin rash/lesions?: No Itching?: Yes  Eyes Blurred vision?: Yes Double vision?: No  Ears/Nose/Throat Sore throat?: No Sinus problems?: Yes  Hematologic/Lymphatic Swollen glands?: No Easy bruising?: No  Cardiovascular Leg swelling?: Yes Chest pain?: No  Respiratory Cough?: No Shortness of breath?: Yes  Endocrine Excessive thirst?: No  Musculoskeletal Back pain?: Yes Joint pain?: Yes  Neurological Headaches?: No Dizziness?: Yes  Psychologic Depression?: No Anxiety?: No  Physical Exam: BP (!) 182/107   Pulse 85   Resp 16   Ht 5\' 4"  (1.626 m)   Wt 160 lb (72.6 kg)   SpO2 98%   BMI 27.46 kg/m   Constitutional:  Alert and oriented, No acute distress. HEENT: [place] AT, moist  mucus membranes.  Trachea midline Cardiovascular: No clubbing, cyanosis, or edema. Respiratory: Normal respiratory effort, no increased work of breathing. Skin: No rashes, bruises or suspicious lesions. Neurologic: Grossly intact, no focal deficits, moving all 4 extremities. Psychiatric: Normal mood and affect.  Laboratory Data: Lab Results  Component Value Date   WBC 6.7 02/28/2017   HGB 15.8 02/28/2017   HCT 47.6 (H) 02/28/2017   MCV 87 02/28/2017   PLT 249 02/28/2017    Lab Results  Component Value Date   CREATININE 1.34 (H) 07/04/2017    Lab Results  Component Value Date   HGBA1C 5.7 (H) 07/04/2017    Urinalysis Dipstick 1+ leukocytes Microscopy no pyuria or microhematuria  Assessment & Plan:    1. Recurrent UTI 84 year old female with history of recurrent urinary tract infections.  Urinalysis today shows no significant findings.  A urine culture was repeated.  Will schedule a renal ultrasound  for further evaluation.  I also discussed cystoscopy however she wanted to hold off until the ultrasound was performed.   Abbie Sons, [place] [address] [place], [place] [address] [phone]

## 2017-09-20 LAB — URINALYSIS, COMPLETE
BILIRUBIN UA: NEGATIVE
GLUCOSE, UA: NEGATIVE
KETONES UA: NEGATIVE
NITRITE UA: NEGATIVE
Protein, UA: NEGATIVE
RBC UA: NEGATIVE
SPEC GRAV UA: 1.01 (ref 1.005–1.030)
Urobilinogen, Ur: 0.2 mg/dL (ref 0.2–1.0)
pH, UA: 6.5 (ref 5.0–7.5)

## 2017-09-20 LAB — MICROSCOPIC EXAMINATION
EPITHELIAL CELLS (NON RENAL): NONE SEEN /HPF (ref 0–10)
RBC, UA: NONE SEEN /hpf (ref 0–2)

## 2017-09-21 LAB — CULTURE, URINE COMPREHENSIVE

## 2017-09-22 ENCOUNTER — Telehealth: Payer: Self-pay

## 2017-09-22 ENCOUNTER — Other Ambulatory Visit: Payer: Self-pay | Admitting: Urology

## 2017-09-22 MED ORDER — CEPHALEXIN 500 MG PO CAPS: 500.0000 mg | ORAL_CAPSULE | Freq: Two times a day (BID) | ORAL | 0 refills | Status: AC

## 2017-09-22 NOTE — Telephone Encounter (Signed)
LMOM- abx sent to pharmacy 

## 2017-09-22 NOTE — Telephone Encounter (Signed)
-----   Message from Abbie Sons, MD sent at 09/22/2017 12:43 PM EDT ----- Urine culture was positive.  Antibiotic Rx was sent to pharmacy.

## 2017-09-27 ENCOUNTER — Other Ambulatory Visit: Payer: Self-pay | Admitting: Internal Medicine

## 2017-09-27 ENCOUNTER — Telehealth: Payer: Self-pay

## 2017-09-27 NOTE — Telephone Encounter (Signed)
Patients friend Marjory Sneddon called about patient. Stated she has been seeing Dr Phillip Heal for 8 weeks for leg wound. Dr Phillip Heal stated yesterday she thinks patient should increase fluid pill to help with swelling around wound. Calling to ask Dr Gaspar Cola opinion.   Dr Army Melia stated she thinks the boot should help with swelling but if they increase it wouldn't hurt to take a tablet and a half daily of the hctz medication until seeing Dr Phillip Heal again. She verbalized understanding and will inform patient.

## 2017-10-03 ENCOUNTER — Emergency Department: Payer: Medicare PPO

## 2017-10-03 ENCOUNTER — Other Ambulatory Visit: Payer: Self-pay

## 2017-10-03 ENCOUNTER — Encounter: Payer: Self-pay | Admitting: Emergency Medicine

## 2017-10-03 ENCOUNTER — Emergency Department
Admission: EM | Admit: 2017-10-03 | Discharge: 2017-10-04 | Disposition: A | Discharge status: Home / Self Care | Payer: Medicare PPO | Attending: Emergency Medicine | Admitting: Emergency Medicine

## 2017-10-03 DIAGNOSIS — Z7984 Long term (current) use of oral hypoglycemic drugs: Secondary | ICD-10-CM | POA: Diagnosis not present

## 2017-10-03 DIAGNOSIS — M542 Cervicalgia: Secondary | ICD-10-CM | POA: Diagnosis not present

## 2017-10-03 DIAGNOSIS — I5032 Chronic diastolic (congestive) heart failure: Secondary | ICD-10-CM | POA: Insufficient documentation

## 2017-10-03 DIAGNOSIS — W01190A Fall on same level from slipping, tripping and stumbling with subsequent striking against furniture, initial encounter: Secondary | ICD-10-CM | POA: Insufficient documentation

## 2017-10-03 DIAGNOSIS — Z7982 Long term (current) use of aspirin: Secondary | ICD-10-CM | POA: Diagnosis not present

## 2017-10-03 DIAGNOSIS — W19XXXA Unspecified fall, initial encounter: Secondary | ICD-10-CM

## 2017-10-03 DIAGNOSIS — Z79899 Other long term (current) drug therapy: Secondary | ICD-10-CM | POA: Diagnosis not present

## 2017-10-03 DIAGNOSIS — S0101XA Laceration without foreign body of scalp, initial encounter: Secondary | ICD-10-CM | POA: Diagnosis not present

## 2017-10-03 DIAGNOSIS — Y999 Unspecified external cause status: Secondary | ICD-10-CM | POA: Insufficient documentation

## 2017-10-03 DIAGNOSIS — I110 Hypertensive heart disease with heart failure: Secondary | ICD-10-CM | POA: Insufficient documentation

## 2017-10-03 DIAGNOSIS — M25551 Pain in right hip: Secondary | ICD-10-CM | POA: Insufficient documentation

## 2017-10-03 DIAGNOSIS — Y9389 Activity, other specified: Secondary | ICD-10-CM | POA: Diagnosis not present

## 2017-10-03 DIAGNOSIS — E119 Type 2 diabetes mellitus without complications: Secondary | ICD-10-CM | POA: Diagnosis not present

## 2017-10-03 DIAGNOSIS — Y929 Unspecified place or not applicable: Secondary | ICD-10-CM | POA: Insufficient documentation

## 2017-10-03 DIAGNOSIS — S0990XA Unspecified injury of head, initial encounter: Secondary | ICD-10-CM | POA: Diagnosis present

## 2017-10-03 MED ORDER — LIDOCAINE HCL (PF) 1 % IJ SOLN
INTRAMUSCULAR | Status: AC
  Filled 2017-10-03: qty 5

## 2017-10-03 NOTE — ED Triage Notes (Addendum)
Pt bib [facility] d/t fall at home when pt reports shoes stuck to floor and was unable to move feet quick enough while turning and fell. Denies LOC. Reporting right hip pain and laceration noted to posterior head, bleeding controlled. Denies blood thinners. Uses a walker or 2 canes at baseline, only had 1 cane when fell. EDP at bedside at arrival. Pt has wound to left ankle that's being followed, last appointment and dressing change was today at 4pm, due to be changed again Friday by home health

## 2017-10-03 NOTE — Discharge Instructions (Addendum)
The staple will need to be removed in 7-10 days. Please seek medical attention for any high fevers, chest pain, shortness of breath, change in behavior, persistent vomiting, bloody stool or any other new or concerning symptoms.

## 2017-10-03 NOTE — ED Provider Notes (Signed)
[facility] Emergency Department Provider Note   ____________________________________________   I have reviewed the triage vital signs and the nursing notes.   HISTORY  Chief Complaint Fall and Laceration   History limited by: Not Limited   HPI Laura [name] is a 84 y.o. female who presents to the emergency department today because of concerns for fall and head injury.  Patient states that she was getting up after watching some jeopardy.  She feels like her shoes got caught on the floor and she fell over backwards.  Hit her back of her head against a piece of furniture.  She denies any loss of consciousness.  She did have some head and neck pain after the fall.  Additionally she had some right hip pain although was able to bear weight on that leg per EMS.  Patient denies any recent illnesses.  Denies any chest pain or lightheadedness.   Per medical record review patient has a history of HTN, CHF.   Past Medical History:  Diagnosis Date  . Arthritis   . Cystitis   . Diabetes ([place])   . Heart disease   . HTN (hypertension)     Patient Active Problem List   Diagnosis Date Noted  . Thoracic spine pain 07/04/2017  . Chronic diastolic CHF (congestive heart failure), NYHA class 2 ([place]) 06/27/2017  . Skin ulcer of knee, left, limited to breakdown of skin ([place]) 03/01/2017  . Long term current use of inhaled steroid 03/01/2017  . Esophageal dysphagia 02/28/2017  . Arthritis of knee, right 09/13/2016  . Bursitis of right elbow 09/13/2016  . Vaginal atrophy 10/24/2015  . Mixed stress and urge urinary incontinence 10/24/2015  . Vasculitis ([place]) 09/21/2015  . DM (diabetes mellitus), type 2 with renal complications ([place]) 64/33/2951  . Carpal tunnel syndrome 01/16/2015  . DM (diabetes mellitus), type 2 with peripheral vascular complications ([place]) 88/41/6606  . Dyslipidemia 01/16/2015  . Amputated great toe of left foot ([place]) 01/16/2015  . Hypercalcemia 01/16/2015   . Local edema 01/16/2015  . Lumbar radiculopathy 01/16/2015  . MGUS (monoclonal gammopathy of unknown significance) 01/16/2015  . Vascular disorder of skin 01/16/2015  . Dermatitis 01/16/2015  . Hypertensive left ventricular hypertrophy 07/17/2014  . MI (mitral incompetence) 07/17/2014  . TI (tricuspid incompetence) 07/17/2014    Past Surgical History:  Procedure Laterality Date  . APPENDECTOMY    . bone marrow withdrawal    . colon polyp removal    . [place]  . TOE AMPUTATION Left 08/2013  . TOTAL ABDOMINAL HYSTERECTOMY  1963    Prior to Admission medications   Medication Sig Start Date End Date Taking? Authorizing Provider  ACCU-CHEK AVIVA PLUS test strip USE ONE STRIP TO CHECK GLUCOSE ONCE DAILY 01/29/17   Glean Hess, MD  aspirin 81 MG chewable tablet Chew 1 tablet by mouth daily.    [provider]  cephALEXin (KEFLEX) 500 MG capsule Take 1 capsule (500 mg total) by mouth 2 (two) times daily. 09/22/17   Stoioff, [name] Fairly, MD  cholecalciferol (VITAMIN D) 1000 units tablet Take 1,000 Units by mouth daily.    [provider]  clobetasol ointment (TEMOVATE) 0.05 % CLOBETASOL PROPIONATE, 0.05% (External Ointment) - Historical Medication  appication two times daily (0.05 %) Active    [provider]  cloNIDine (CATAPRES) 0.2 MG tablet TAKE 1 TABLET BY MOUTH TWICE DAILY 08/28/17   Glean Hess, MD  COLLAGEN-ANTIMICROBIAL EX Apply topically.    [provider]  fexofenadine (ALLEGRA) 180 MG tablet Take 1 tablet by mouth daily as needed. 12/11/12   [provider]  ibuprofen (ADVIL,MOTRIN) 200 MG tablet Take 400 mg by mouth 2 (two) times daily.     [provider]  metFORMIN (GLUCOPHAGE) 500 MG tablet Take 1 tablet (500 mg total) by mouth daily. 09/13/16   Glean Hess, MD  mometasone (NASONEX) 50 MCG/ACT nasal spray Place 2 sprays into the nose daily as needed. 09/13/16   Glean Hess, MD   telmisartan (MICARDIS) 80 MG tablet Take 1 tablet by mouth daily. 06/28/17   [provider]  triamterene-hydrochlorothiazide (MAXZIDE-25) 37.5-25 MG tablet TAKE 1 TABLET BY MOUTH ONCE DAILY 08/28/17   Glean Hess, MD    Allergies Augmentin [amoxicillin-pot clavulanate]; Calcium channel blockers; Nitrofurantoin; Ace inhibitors; Beta adrenergic blockers; Levaquin [levofloxacin]; Omnicef [cefdinir]; Statins; Sulfa antibiotics; Clindamycin; and Doxycycline  Family History  Problem Relation Age of Onset  . Diabetes Mother   . Stroke Mother   . Hypertension Father   . Kidney disease Neg Hx   . Bladder Cancer Neg Hx     Social History Social History   Tobacco Use  . Smoking status: Never Smoker  . Smokeless tobacco: Never Used  Substance Use Topics  . Alcohol use: No    Alcohol/week: 0.0 oz  . Drug use: No    Review of Systems Constitutional: No fever/chills Eyes: No visual changes. ENT: No sore throat. Cardiovascular: Denies chest pain. Respiratory: Denies shortness of breath. Gastrointestinal: No abdominal pain.  No nausea, no vomiting.  No diarrhea.   Genitourinary: Negative for dysuria. Musculoskeletal: Positive for neck pain, right hip pain. Skin: Positive for cut to back of her head Neurological: Positive for headache. ____________________________________________   PHYSICAL EXAM:  VITAL SIGNS: ED Triage Vitals  Enc Vitals Group     BP 224/105     Pulse 97     Resp 17     Temp 98.4     Temp src      SpO2 96   Constitutional: Awake and alert. Well appearing and in no distress. Eyes: Conjunctivae are normal.  ENT   Head: Normocephalic.   Nose: No congestion/rhinnorhea.   Mouth/Throat: Mucous membranes are moist.   Neck: No stridor. Hematological/Lymphatic/Immunilogical: No cervical lymphadenopathy. Cardiovascular: Normal rate, regular rhythm.  No murmurs, rubs, or gallops.  Respiratory: Normal respiratory effort without tachypnea  nor retractions. Breath sounds are clear and equal bilaterally. No wheezes/rales/rhonchi. Gastrointestinal: Soft and non tender. No rebound. No guarding.  Genitourinary: Deferred Musculoskeletal: Normal range of motion in all extremities. NO tenderness to manipulation or palpation of hips. No lower extremity edema. Neurologic:  Normal speech and language. No gross focal neurologic deficits are appreciated.  Skin:  Small roughly 1 cm laceration to occiput.  Psychiatric: Mood and affect are normal. Speech and behavior are normal. Patient exhibits appropriate insight and judgment.  ____________________________________________    LABS (pertinent positives/negatives)  None  ____________________________________________   EKG  None  ____________________________________________    RADIOLOGY  CT head/cervical spine No acute findings  Right hip No acute findings   ____________________________________________   PROCEDURES  Procedures  LACERATION REPAIR Performed by: Nance Pear Authorized by: Nance Pear Consent: Verbal consent obtained. Risks and benefits: risks, benefits and alternatives were discussed Consent given by: patient Patient identity confirmed: provided demographic data Prepped and Draped in normal sterile fashion Wound explored  Laceration Location: occiput  Laceration Length: 1 cm  No Foreign Bodies seen or  palpated  Anesthesia: local infiltration  Local anesthetic: lidocaine 1%   Anesthetic total: 1 ml  Skin closure: staple  Number of staple: 1  Technique: staple  Patient tolerance: Patient tolerated the procedure well with no immediate complications.  ____________________________________________   INITIAL IMPRESSION / ASSESSMENT AND PLAN / ED COURSE  Pertinent labs & imaging results that were available during my care of the patient were reviewed by me and considered in my medical decision making (see chart for  details).  Patient presented to the emergency department today because of concerns for fall and scalp laceration.  Patient states it was due to tripping over her feet.  Head CT and cervical spine CT without concerning findings.  Patient did have a small laceration which was stapled.  Patient was also complaining of right hip pain.  Although there is no tenderness on exam x-ray was obtained which not show any concerning findings.  This point I doubt fracture.  Plan on discharging home.  Discussed findings with patient's son.  ____________________________________________   FINAL CLINICAL IMPRESSION(S) / ED DIAGNOSES  Final diagnoses:  Fall, initial encounter  Laceration of scalp, initial encounter     Note: This dictation was prepared with Diplomatic Services operational officer dictation. Any transcriptional errors that result from this process are unintentional     Nance Pear, MD 10/04/17 [phone]

## 2017-10-03 NOTE — ED Notes (Signed)
Pt. Verbalizes understanding of d/c instructions and follow-up. pain controlled per pt.  Pt. In NAD at time of d/c and denies further concerns regarding this visit. Pt. Stable at the time of departure from the unit, departing unit by the safest and most appropriate manner per that pt condition and limitations with all belongings accounted for. Pt advised to return to the ED at any time for emergent concerns, or for new/worsening symptoms.

## 2017-10-04 NOTE — ED Notes (Signed)
1 Staple placed by MD Archie Balboa

## 2017-10-10 ENCOUNTER — Ambulatory Visit: Payer: Self-pay | Admitting: Urology

## 2017-10-13 ENCOUNTER — Emergency Department
Admission: EM | Admit: 2017-10-13 | Discharge: 2017-10-18 | Disposition: E | Payer: Medicare PPO | Attending: Emergency Medicine | Admitting: Emergency Medicine

## 2017-10-13 ENCOUNTER — Emergency Department: Payer: Medicare PPO

## 2017-10-13 ENCOUNTER — Encounter: Payer: Self-pay | Admitting: Emergency Medicine

## 2017-10-13 DIAGNOSIS — Z7984 Long term (current) use of oral hypoglycemic drugs: Secondary | ICD-10-CM | POA: Insufficient documentation

## 2017-10-13 DIAGNOSIS — I7101 Dissection of thoracic aorta: Secondary | ICD-10-CM

## 2017-10-13 DIAGNOSIS — I462 Cardiac arrest due to underlying cardiac condition: Secondary | ICD-10-CM | POA: Diagnosis not present

## 2017-10-13 DIAGNOSIS — I71012 Dissection of descending thoracic aorta: Secondary | ICD-10-CM

## 2017-10-13 DIAGNOSIS — Z7982 Long term (current) use of aspirin: Secondary | ICD-10-CM | POA: Diagnosis not present

## 2017-10-13 DIAGNOSIS — R079 Chest pain, unspecified: Secondary | ICD-10-CM | POA: Diagnosis present

## 2017-10-13 DIAGNOSIS — E119 Type 2 diabetes mellitus without complications: Secondary | ICD-10-CM | POA: Diagnosis not present

## 2017-10-13 LAB — BASIC METABOLIC PANEL
ANION GAP: 10 (ref 5–15)
BUN: 17 mg/dL (ref 6–20)
CO2: 27 mmol/L (ref 22–32)
Calcium: 9.7 mg/dL (ref 8.9–10.3)
Chloride: 94 mmol/L — ABNORMAL LOW (ref 101–111)
Creatinine, Ser: 0.91 mg/dL (ref 0.44–1.00)
GFR calc Af Amer: >60 mL/min (ref >60)
GFR calc non Af Amer: 54 mL/min — ABNORMAL LOW (ref >60)
GLUCOSE: 121 mg/dL — AB (ref 65–99)
POTASSIUM: 3.6 mmol/L (ref 3.5–5.1)
Sodium: 131 mmol/L — ABNORMAL LOW (ref 135–145)

## 2017-10-13 LAB — CBC
HEMATOCRIT: 41.1 % (ref 35.0–47.0)
HEMOGLOBIN: 14.1 g/dL (ref 12.0–16.0)
MCH: 29 pg (ref 26.0–34.0)
MCHC: 34.2 g/dL (ref 32.0–36.0)
MCV: 84.8 fL (ref 80.0–100.0)
Platelets: 342 10*3/uL (ref 150–440)
RBC: 4.85 MIL/uL (ref 3.80–5.20)
RDW: 14.7 % — ABNORMAL HIGH (ref 11.5–14.5)
WBC: 9.2 10*3/uL (ref 3.6–11.0)

## 2017-10-13 LAB — GLUCOSE, CAPILLARY: Glucose-Capillary: 117 mg/dL — ABNORMAL HIGH (ref 65–99)

## 2017-10-13 LAB — TROPONIN I: Troponin I: 0.03 ng/mL (ref <0.03)

## 2017-10-13 MED ORDER — IOPAMIDOL (ISOVUE-370) INJECTION 76%
75.0000 mL | Freq: Once | INTRAVENOUS | Status: AC | PRN
  Administered 2017-10-13: 75 mL via INTRAVENOUS

## 2017-10-13 MED ORDER — DEXTROSE 5 % IV SOLN
INTRAVENOUS | Status: DC | PRN
  Administered 2017-10-13: 300 mg via INTRAVENOUS

## 2017-10-13 MED ORDER — MORPHINE SULFATE (PF) 2 MG/ML IV SOLN
2.0000 mg | Freq: Once | INTRAVENOUS | Status: AC
Start: 2017-10-13 — End: 2017-10-13
  Administered 2017-10-13: 2 mg via INTRAVENOUS
  Filled 2017-10-13: qty 1

## 2017-10-13 MED ORDER — EPINEPHRINE PF 1 MG/10ML IJ SOSY
PREFILLED_SYRINGE | INTRAMUSCULAR | Status: DC | PRN
  Administered 2017-10-13 (×2): 1 mg via INTRAVENOUS

## 2017-10-13 MED ORDER — DEXTROSE 5 % IV SOLN: 300.0000 mg | Freq: Once | INTRAVENOUS | Status: DC

## 2017-10-18 NOTE — ED Notes (Signed)
Patient transported to CT 

## 2017-10-18 NOTE — ED Notes (Signed)
Attempted to call family of patient to inform them that the patient has some belonging here at the hospital that were left. No voicemail capabilities were set up for the patient family contact number.

## 2017-10-18 NOTE — ED Notes (Signed)
Patient arrived from CT with no pulse.  CT tech says patient had no signs of distress but appeared to not be breathing when she got to the room.

## 2017-10-18 NOTE — ED Notes (Signed)
Per family request, withdraw CPR and resuscitation and provide comfort only for the patient.  CPR ceased per MD order.  Patient monitoring discontinued.

## 2017-10-18 NOTE — ED Notes (Signed)
Pulse check, pulse present, v-tach rhythm present, patient shocked x1 @ 120J

## 2017-10-18 NOTE — ED Notes (Signed)
Date and time results received: 10/20/17 1835 (use smartphrase ".now" to insert current time)  Test: Troponin Critical Value: 0.03  Name of Provider Notified: Dr. Jacqualine Code

## 2017-10-18 NOTE — ED Notes (Signed)
Pulse check, no pulse, resume CPR. 

## 2017-10-18 NOTE — ED Notes (Signed)
Pulse check, no pulse present, v-tach rhythm present, patient shocked x1 @ 200J

## 2017-10-18 NOTE — ED Triage Notes (Signed)
Pt ems from home for chest pain that started approx. 1600. Chest pain started on back between shoulder blades and felt "very painful". Pain was 10/10. Ems gave 324 mg aspirin and 1 nitro spray. Pain on arrival was 8/10.

## 2017-10-18 NOTE — ED Notes (Signed)
TOD announced by MD with family present.

## 2017-10-18 NOTE — ED Notes (Signed)
Final pulse check, pulse absent, family is at bedside and MD d/c breathing tube.

## 2017-10-18 NOTE — ED Provider Notes (Signed)
[facility] Emergency Department Provider Note   ____________________________________________   First MD Initiated Contact with Patient 11-Nov-2017 1736     (approximate)  I have reviewed the triage vital signs and the nursing notes.   HISTORY  Chief Complaint Chest Pain    HPI Laura [name] is a 84 y.o. female history of diabetes, hypertension  Patient presents for evaluation of chest pain.  She reports she is been having pain across her chest since this afternoon when her wound care nurse was dressing her left lower leg.  It was rather sudden in onset and is worsened by deep inspiration and radiates to her back.  Is associated with no nausea or vomiting.  She denies heavy chest pressure, but reports it feels like as sharp discomfort underneath her breastbone.  No shortness of breath.  Reports she tried taking ibuprofen without any relief of pain  She was given aspirin and nitroglycerin with EMS which provided moderate relief to about a 5 out of 10 pain.  She currently reports moderate to severe ongoing pain in the central chest.  Denies any fall or injury today, did have a fall about 2 weeks ago.  Reports she has bruising around the face and neck from that.    Past Medical History:  Diagnosis Date  . Arthritis   . Cystitis   . Diabetes ([place])   . Heart disease   . HTN (hypertension)     Patient Active Problem List   Diagnosis Date Noted  . Thoracic spine pain 07/04/2017  . Chronic diastolic CHF (congestive heart failure), NYHA class 2 ([place]) 06/27/2017  . Skin ulcer of knee, left, limited to breakdown of skin ([place]) 03/01/2017  . Long term current use of inhaled steroid 03/01/2017  . Esophageal dysphagia 02/28/2017  . Arthritis of knee, right 09/13/2016  . Bursitis of right elbow 09/13/2016  . Vaginal atrophy 10/24/2015  . Mixed stress and urge urinary incontinence 10/24/2015  . Vasculitis ([place]) 09/21/2015  . DM (diabetes mellitus), type 2 with  renal complications ([place]) 46/65/9935  . Carpal tunnel syndrome 01/16/2015  . DM (diabetes mellitus), type 2 with peripheral vascular complications ([place]) 70/17/7939  . Dyslipidemia 01/16/2015  . Amputated great toe of left foot ([place]) 01/16/2015  . Hypercalcemia 01/16/2015  . Local edema 01/16/2015  . Lumbar radiculopathy 01/16/2015  . MGUS (monoclonal gammopathy of unknown significance) 01/16/2015  . Vascular disorder of skin 01/16/2015  . Dermatitis 01/16/2015  . Hypertensive left ventricular hypertrophy 07/17/2014  . MI (mitral incompetence) 07/17/2014  . TI (tricuspid incompetence) 07/17/2014    Past Surgical History:  Procedure Laterality Date  . APPENDECTOMY    . bone marrow withdrawal    . colon polyp removal    . [place]  . TOE AMPUTATION Left 08/2013  . TOTAL ABDOMINAL HYSTERECTOMY  1963    Prior to Admission medications   Medication Sig Start Date End Date Taking? Authorizing Provider  ACCU-CHEK AVIVA PLUS test strip USE ONE STRIP TO CHECK GLUCOSE ONCE DAILY 01/29/17   Glean Hess, MD  aspirin 81 MG chewable tablet Chew 1 tablet by mouth daily.    [provider]  cephALEXin (KEFLEX) 500 MG capsule Take 1 capsule (500 mg total) by mouth 2 (two) times daily. 09/22/17   Stoioff, [name] Fairly, MD  cholecalciferol (VITAMIN D) 1000 units tablet Take 1,000 Units by mouth daily.    [provider]  clobetasol ointment (TEMOVATE) 0.05 % CLOBETASOL PROPIONATE, 0.05% (External  Ointment) - Historical Medication  appication two times daily (0.05 %) Active    [provider]  cloNIDine (CATAPRES) 0.2 MG tablet TAKE 1 TABLET BY MOUTH TWICE DAILY 08/28/17   Glean Hess, MD  COLLAGEN-ANTIMICROBIAL EX Apply topically.    [provider]  fexofenadine (ALLEGRA) 180 MG tablet Take 1 tablet by mouth daily as needed. 12/11/12   [provider]  ibuprofen (ADVIL,MOTRIN) 200 MG tablet Take 400 mg by mouth 2 (two) times daily.      [provider]  metFORMIN (GLUCOPHAGE) 500 MG tablet Take 1 tablet (500 mg total) by mouth daily. 09/13/16   Glean Hess, MD  mometasone (NASONEX) 50 MCG/ACT nasal spray Place 2 sprays into the nose daily as needed. 09/13/16   Glean Hess, MD  telmisartan (MICARDIS) 80 MG tablet Take 1 tablet by mouth daily. 06/28/17   [provider]  triamterene-hydrochlorothiazide (MAXZIDE-25) 37.5-25 MG tablet TAKE 1 TABLET BY MOUTH ONCE DAILY 08/28/17   Glean Hess, MD    Allergies Augmentin [amoxicillin-pot clavulanate]; Calcium channel blockers; Nitrofurantoin; Ace inhibitors; Beta adrenergic blockers; Levaquin [levofloxacin]; Omnicef [cefdinir]; Statins; Sulfa antibiotics; Clindamycin; and Doxycycline  Family History  Problem Relation Age of Onset  . Diabetes Mother   . Stroke Mother   . Hypertension Father   . Kidney disease Neg Hx   . Bladder Cancer Neg Hx     Social History Social History   Tobacco Use  . Smoking status: Never Smoker  . Smokeless tobacco: Never Used  Substance Use Topics  . Alcohol use: No    Alcohol/week: 0.0 oz  . Drug use: No    Review of Systems Constitutional: No fever/chills Eyes: No visual changes. ENT: No sore throat. Cardiovascular: See HPI Respiratory: Denies shortness of breath. Gastrointestinal: No abdominal pain.  No nausea, no vomiting.  No diarrhea.  No constipation. Genitourinary: Negative for dysuria. Musculoskeletal: Negative for back pain.  Wound to the left lower leg being changed frequently by home health nurse Skin: Negative for rash. Neurological: Negative for headaches, focal weakness or numbness.    ____________________________________________   PHYSICAL EXAM:  VITAL SIGNS: ED Triage Vitals  Enc Vitals Group     BP 11-02-2017 1830 (!) 168/95     Pulse Rate 11/02/17 1748 94     Resp Nov 02, 2017 1814 17     Temp Nov 02, 2017 1748 98 F (36.7 C)     Temp Source 11/02/17 1748 Oral     SpO2 11/02/2017 1748  94 %     Weight 2017-11-02 1751 165 lb (74.8 kg)     Height 2017/11/02 1751 5\' 4"  (1.626 m)     Head Circumference --      Peak Flow --      Pain Score Nov 02, 2017 1751 10     Pain Loc --      Pain Edu? --      Excl. in [place]? --     Constitutional: Alert and oriented. Well appearing but appears in moderate pain, especially with deep inspiration. Eyes: Conjunctivae are normal. Head: Atraumatic. Nose: No congestion/rhinnorhea. Mouth/Throat: Mucous membranes are moist. Neck: No stridor.   Cardiovascular: Normal rate, regular rhythm. Grossly normal heart sounds.  Good peripheral circulation. Respiratory: Normal respiratory effort.  No retractions. Lungs CTAB.  Reports pain to deep inspiration. Gastrointestinal: Soft and nontender. No distention. Musculoskeletal: No lower extremity tenderness no she does have slight asymmetric edema in the left lower extremity which is bandaged and dressed cleanly. Neurologic:  Normal  speech and language. No gross focal neurologic deficits are appreciated.  Skin:  Skin is warm, dry and intact. No rash noted. Psychiatric: Mood and affect are normal. Speech and behavior are normal.  ____________________________________________   LABS (all labs ordered are listed, but only abnormal results are displayed)  Labs Reviewed  CBC - Abnormal; Notable for the following components:      Result Value   RDW 14.7 (*)    All other components within normal limits  BASIC METABOLIC PANEL - Abnormal; Notable for the following components:   Sodium 131 (*)    Chloride 94 (*)    Glucose, Bld 121 (*)    GFR calc non Af Amer 54 (*)    All other components within normal limits  TROPONIN I - Abnormal; Notable for the following components:   Troponin I 0.03 (*)    All other components within normal limits  GLUCOSE, CAPILLARY - Abnormal; Notable for the following components:   Glucose-Capillary 117 (*)    All other components within normal limits    ____________________________________________  EKG  Reviewed by me and interpreted at 1742 Heart rate 90 QRS 110 QTc 490 Left bundle branch block, no evidence of acute STEMI.  Negative for scar Bosa criteria ____________________________________________  RADIOLOGY       CT scans reviewed by me personally.  Discussed with radiologist.  Type B dissection is most notable ____________________________________________   PROCEDURES  Procedure(s) performed: intubation, CPR  Procedure Name: Intubation Date/Time: 22-Oct-2017 8:39 PM Performed by: Delman Kitten, MD Pre-anesthesia Checklist: Patient identified, Emergency Drugs available, Suction available and Patient being monitored Oxygen Delivery Method: Ambu bag Preoxygenation: Pre-oxygenation with 100% oxygen Laryngoscope Size: Glidescope and 3 Grade View: Grade II Tube size: 7.0 mm Number of attempts: 1 Airway Equipment and Method: Video-laryngoscopy Placement Confirmation: ETT inserted through vocal cords under direct vision,  CO2 detector,  Breath sounds checked- equal and bilateral and Positive ETCO2 Secured at: 21 cm Tube secured with: ETT holder Dental Injury: Teeth and Oropharynx as per pre-operative assessment        Critical Care performed: Yes, see critical care note(s)  Cardiopulmonary Resuscitation (CPR) Procedure Note Directed/Performed by: Delman Kitten I personally directed ancillary staff and/or performed CPR in an effort to regain return of spontaneous circulation and to maintain cardiac, neuro and systemic perfusion.   CRITICAL CARE Performed by: Delman Kitten   Total critical care time: 40 minutes  Critical care time was exclusive of separately billable procedures and treating other patients.  Critical care was necessary to treat or prevent imminent or life-threatening deterioration.  Critical care was time spent personally by me on the following activities: development of treatment plan with patient  and/or surrogate as well as nursing, discussions with consultants, evaluation of patient's response to treatment, examination of patient, obtaining history from patient or surrogate, ordering and performing treatments and interventions, ordering and review of laboratory studies, ordering and review of radiographic studies, pulse oximetry and re-evaluation of patient's condition.   ____________________________________________   INITIAL IMPRESSION / ASSESSMENT AND PLAN / ED COURSE  Pertinent labs & imaging results that were available during my care of the patient were reviewed by me and considered in my medical decision making (see chart for details).  Patient presents for evaluation of chest pain.  Somewhat atypical in nature, but the patient's age is a notable risk factor for coronary disease.  The left bundle branch block is noted, but does not show evidence of an acute ischemic change.  Symptoms  seems atypical of acute coronary syndrome.  Given her recent fall with leg swelling I will order CT to evaluate and exclude pulmonary embolism, also evaluate for other intra-thoracic abnormality.  Her pain is seated between her shoulder blades and across her sternum, no abdominal pain on exam or clinical report.  Provide pain control with morphine.  Potentially musculoskeletal, but elevated suspicion based on the patient's age for potential ACS is considered.  First troponin is minimally elevated   Clinical Course as of Oct 13 2101  05-Nov-2017  3295 Potential critical result called by Dr. Toney Reil.  He reports there is abnormality of the aortic arch and potential for a aortic hematoma or possible small dissection but is not seen well on CT pulmonary embolism.  He is recommended dedicated CT angiogram of the aorta for dissection which I have now ordered.  Patient remains clinically stable and doing well.   [MQ]    Clinical Course User Index [MQ] Delman Kitten, MD    ----------------------------------------- 8:40 PM on 11-05-2017 -----------------------------------------  Patient returned from [place].  The patient was alert, went to CT and CT staff reported she tolerated well without difficulty but when they pushed her back into the room she suddenly appeared worse and unresponsive.  The patient was confirmed to be in cardiopulmonary arrest.  CPR was initiated.  Focused upon high-quality CPR, patient intubated without difficulty.  Differential diagnosis is broad but high concern for possible aortic dissection.  After discussing the patient's goals of care with the patient's son, Yvone Neu, who is at the bedside he and I and his brother in [place] who are reported as her next of kin and son in [place] reports that she has a DO NOT RESUSCITATE.  After discussion with both sons, decision made to focus on comfort care, withdraw CPR and also remove her endotracheal tube.  The patient was made comfort measures only.  Her son is currently at the bedside.  ----------------------------------------- 8:49 PM on Nov 05, 2017 -----------------------------------------  8:44 PM, patient asystolic.  Apneic.  Fixed and dilated pupils.  No spontaneous lung sounds.  The patient's time of death is declared.  Her son, caretaker at the bedside and her son in [place] is aware via phone.  Death certificate will be sent to [facility] primary care for signature, Dr. Army Melia.  I discussed the case with Dr. Brandon Melnick; he was updated on the patient's death and reports that him in the clinic would be able to sign the patient's death certificate. ____________________________________________   FINAL CLINICAL IMPRESSION(S) / ED DIAGNOSES  Final diagnoses:  Dissecting aneurysm of thoracic aorta, Stanford type B ([place])  Cardiac arrest due to underlying cardiac condition ([place])      NEW MEDICATIONS STARTED DURING THIS VISIT:  New Prescriptions   No medications on file     Note:  This  document was prepared using Dragon voice recognition software and may include unintentional dictation errors.     Delman Kitten, MD 2017-11-05 2104

## 2017-10-18 DEATH — deceased

## 2017-11-07 ENCOUNTER — Ambulatory Visit: Payer: Self-pay | Admitting: Internal Medicine

## 2018-10-12 IMAGING — RF DG ESOPHAGUS
9 of 13 series · 14 of 24 positions shown · non-contrast
Comparison: None in PACs

CLINICAL DATA: Buildup of mucus or food stuffs when eating
resulting in intermittent episodes of having distended out. Also
pain associated with these episodes. The symptoms are gradually
worsening over time but have occurred since throat surgery in 3486.

EXAM:
ESOPHOGRAM / BARIUM SWALLOW / BARIUM TABLET STUDY
TECHNIQUE: Combined double contrast and single contrast examination performed
using effervescent crystals, thick barium liquid, and thin barium
liquid. The patient was observed with fluoroscopy swallowing a 13 mm
barium sulphate tablet.
FLUOROSCOPY TIME:  Fluoroscopy Time:  1 minutes, 12 seconds
Radiation Exposure Index (if provided by the fluoroscopic device):
1252 micro Gy per meters square
Number of Acquired Spot Images: 8+ multiple video loops.

[Series 1: fluoro_barium 2fps_bw · 0.18mm/px · 1 of 1 slices shown (1 of 8)]
[im 1/1]
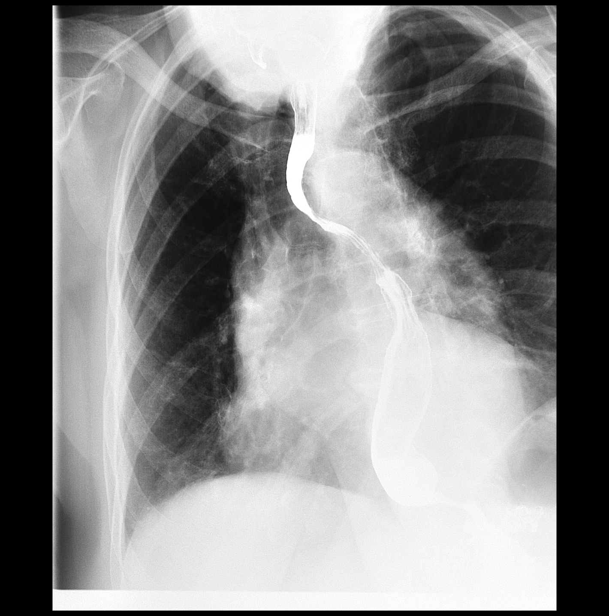

[Series 3: fluoro_barium 2fps_bw · 0.17mm/px · 2 of 16 frames shown (2 of 8)]
[frame 2/16]
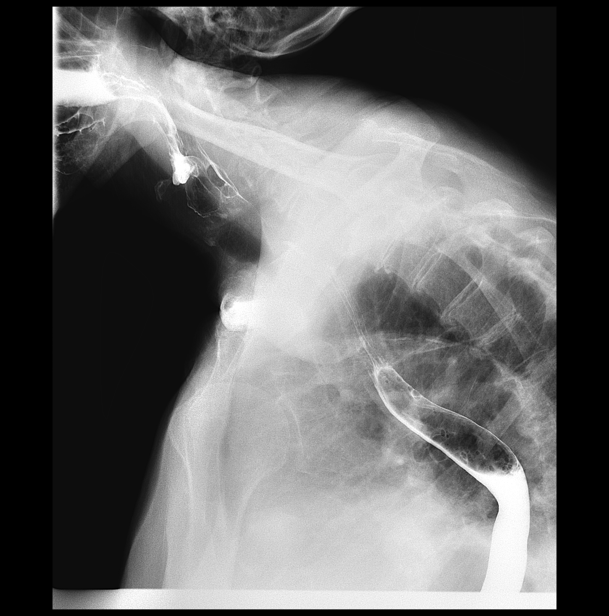
[frame 9/16]
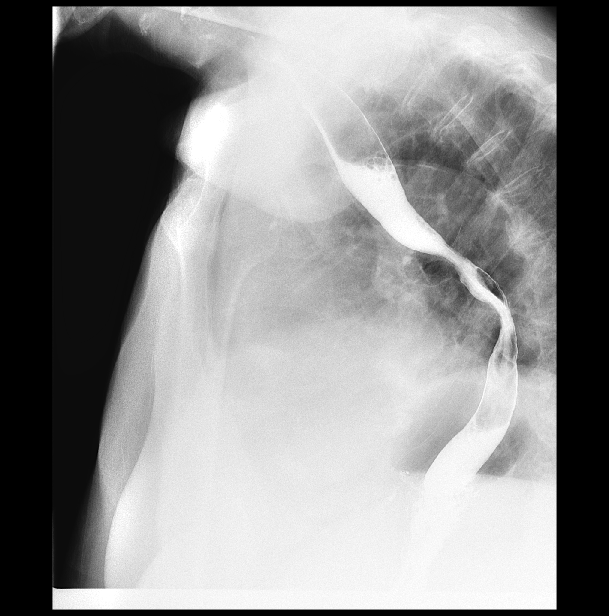

[Series 4: cp_standard · 0.26mm/px · 1 of 1 slices shown]
[im 1/1]
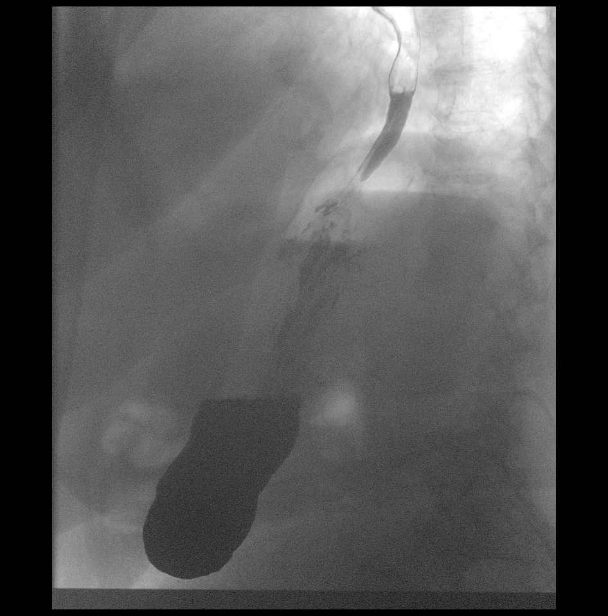

[Series 5: fluoro_barium 2fps_bw · 0.17mm/px · 1 of 1 slices shown (3 of 8)]
[im 1/1]
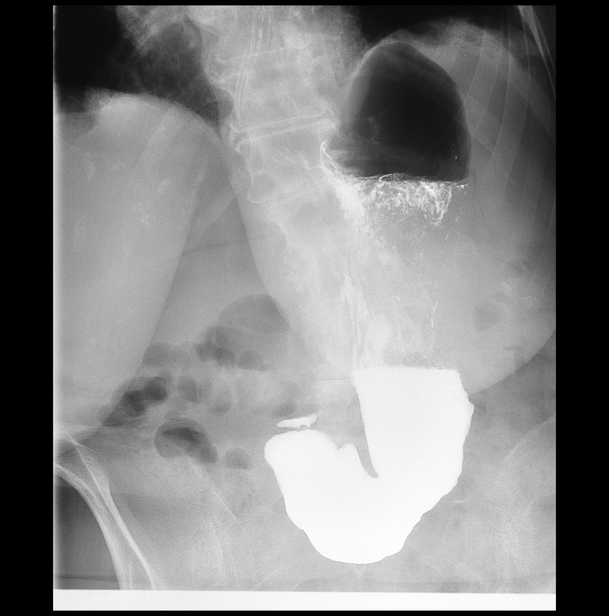

[Series 7: fluoro_barium 2fps_bw · 0.18mm/px · 2 of 19 frames shown (4 of 8)]
[frame 3/19]
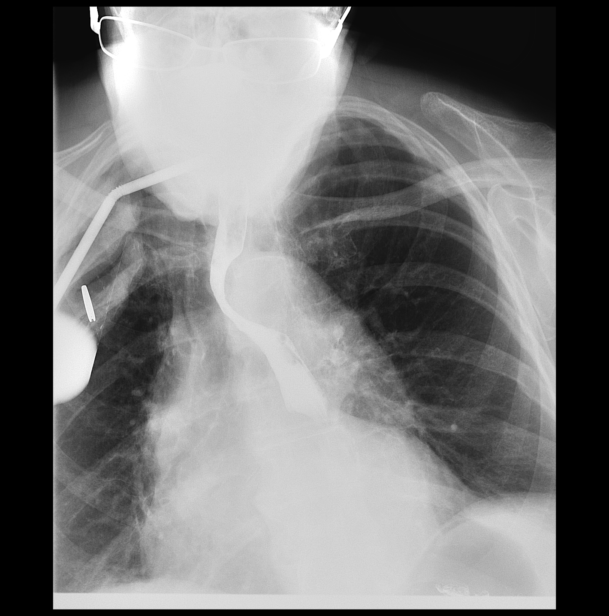
[frame 13/19]
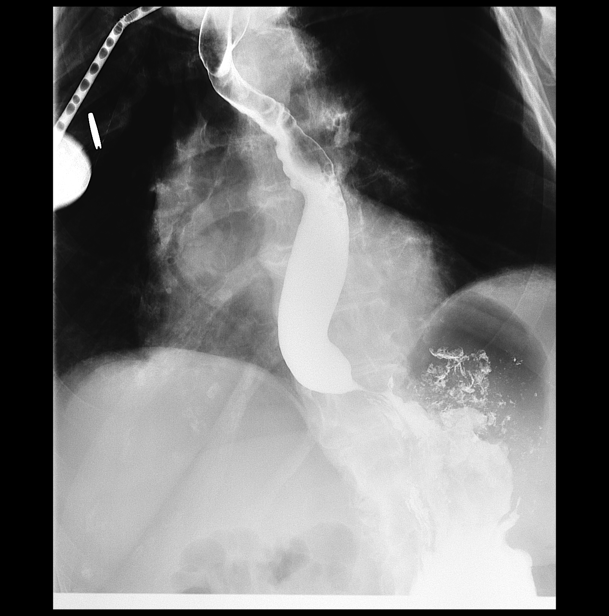

[Series 8: fluoro_barium 2fps_bw · 0.18mm/px · 2 of 6 frames shown (5 of 8)]
[frame 1/6]
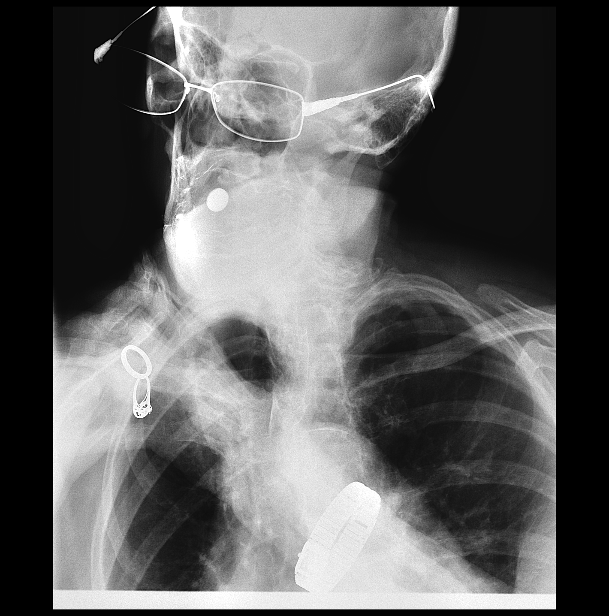
[frame 4/6]
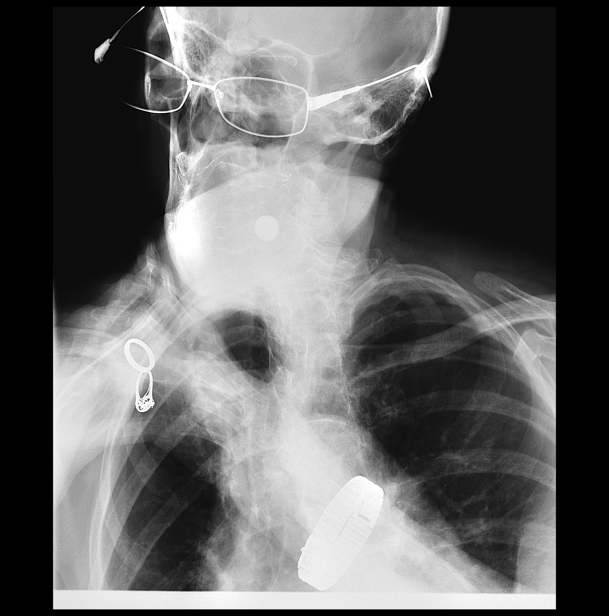

[Series 9: fluoro_barium 2fps_bw · 0.18mm/px · 3 of 5 frames shown (6 of 8)]
[frame 1/5]
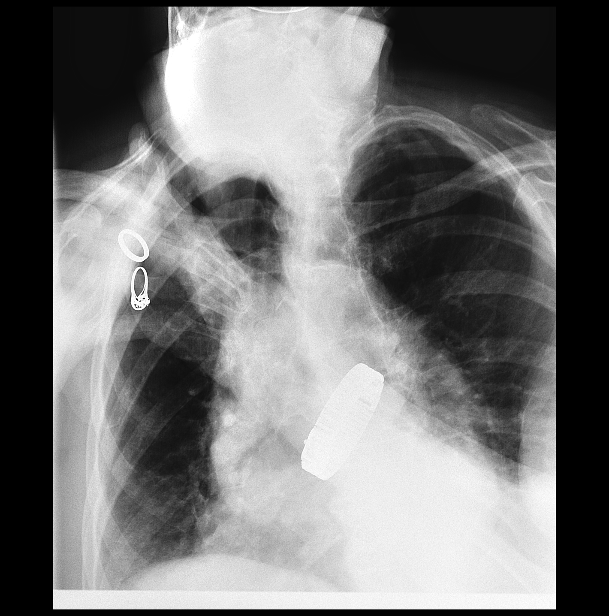
[frame 4/5]
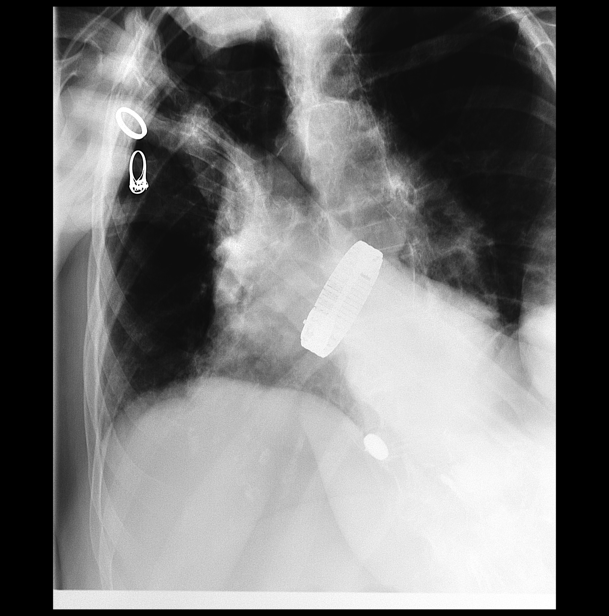
[frame 5/5]
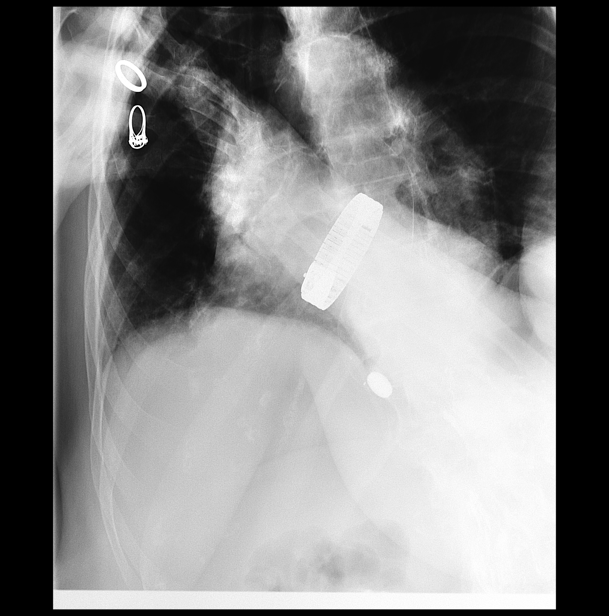

[Series 11: fluoro_barium 2fps_bw · 0.18mm/px · 1 of 1 slices shown (7 of 8)]
[im 1/1]
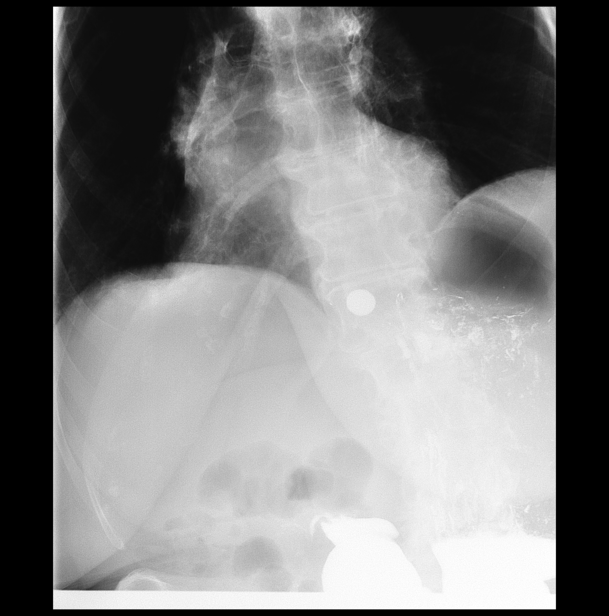

[Series 13: fluoro_barium 2fps_bw · 0.18mm/px · 1 of 1 slices shown (8 of 8)]
[im 1/1]
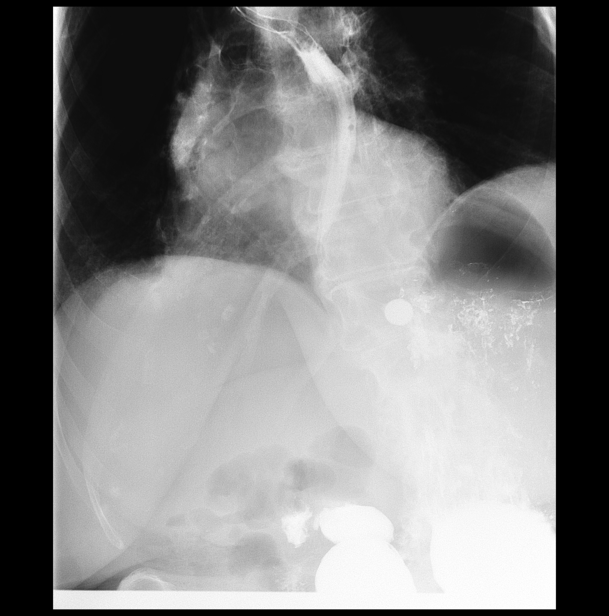

[14 of 24 positions shown; findings below may reference images not displayed]

FINDINGS: The patient ingested the thick and thin barium without difficulty.
Effervescent crystals were not administered due to the patient's
clinical status. The cervical esophagus distended well. A prominent
cricopharyngeus muscle impression was observed. There was no
laryngeal penetration of the barium. The thoracic esophagus was
somewhat tortuous. Esophageal motility was reasonably
well-maintained. There is an area of long segment narrowing noted
approximately 4 cm proximal to the GE junction. Here barium passed
without significant difficulty but the barium tablet would not pass.
A an additional sip of barium outlined the findings to better
advantage but still the tablet did not pass into the stomach.
IMPRESSION: Mild changes of presbyesophagus.  No evidence of aspiration.

Narrowing of the distal esophagus just proximal to the GE junction
which would not allow passage of the barium tablet. Direct
visualization is recommended.

## 2018-12-17 IMAGING — CR DG TIBIA/FIBULA 2V*L*
1 series · 2 of 2 positions shown · non-contrast
Comparison: None.

CLINICAL DATA: Nonhealing wound

EXAM:
LEFT TIBIA AND FIBULA - 2 VIEW

[Series 1: dg tibia/fibula left · 0.14mm/px · 2 of 2 slices shown]
[im 1/2]
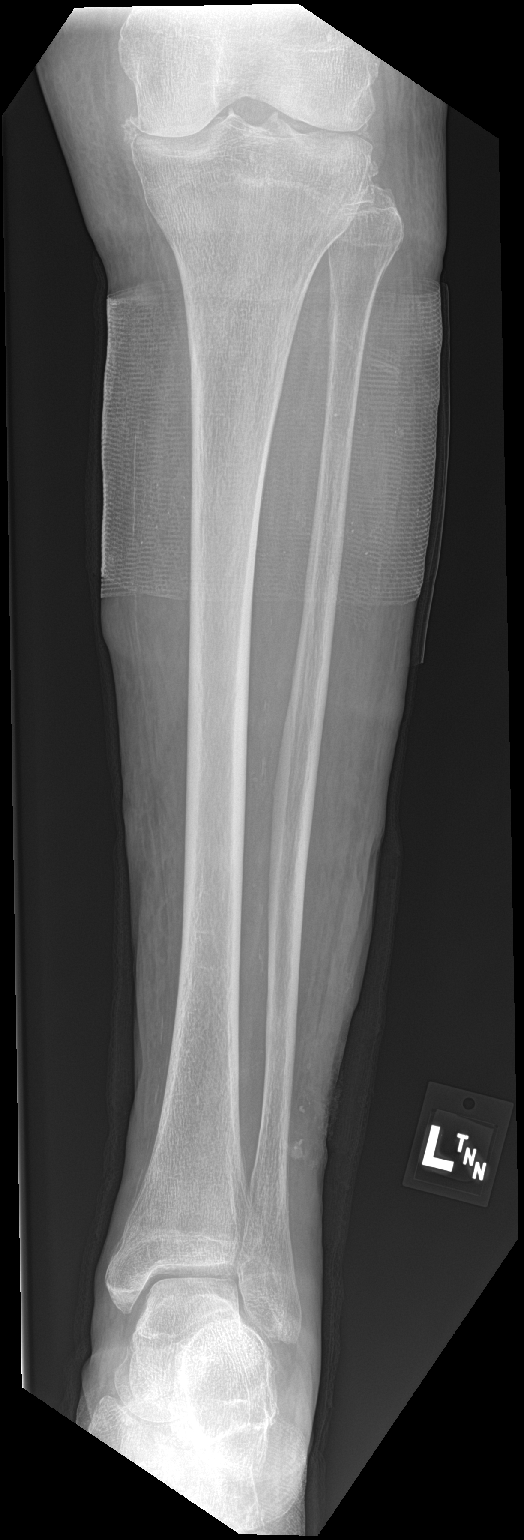
[im 2/2]
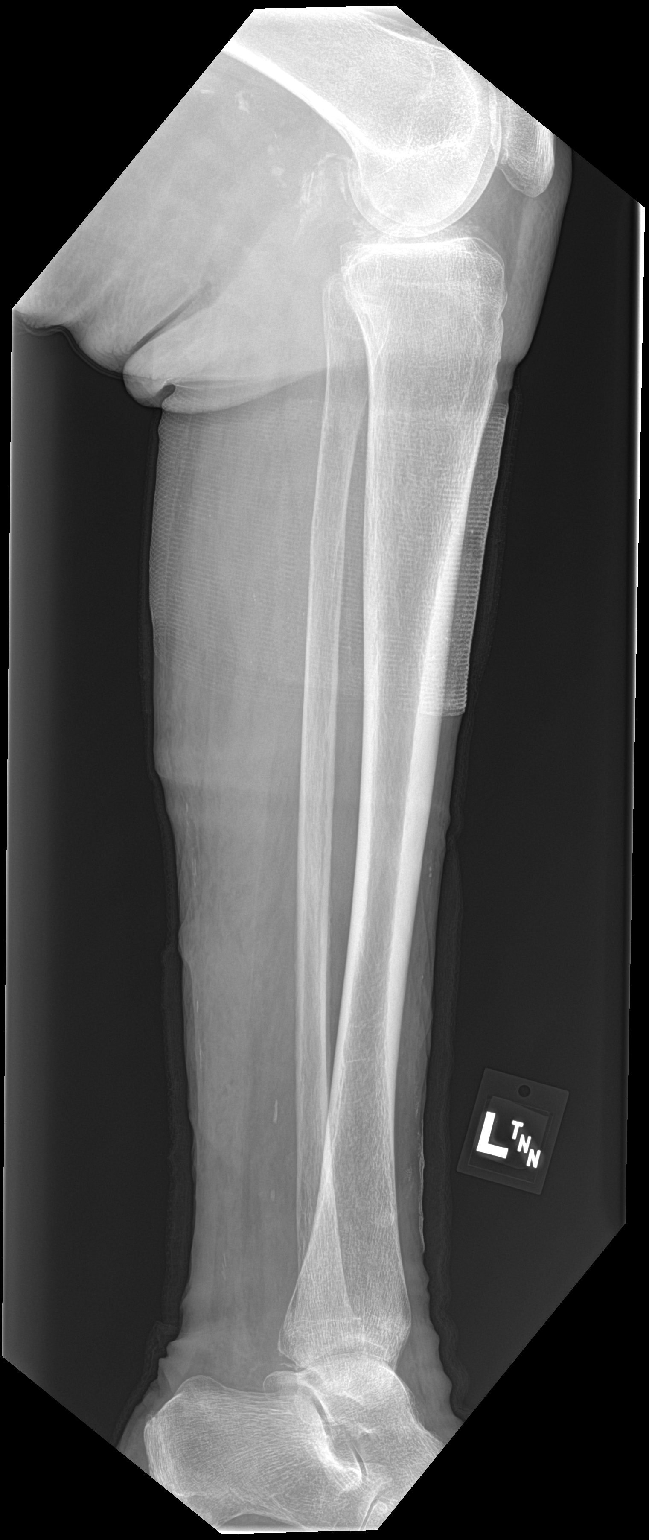

[2 of 2 positions shown; findings below may reference images not displayed]

FINDINGS: Frontal and lateral views were obtained. There is an overlying
bandage. No fracture or dislocation. No erosive change or bony
destruction. No soft tissue air. There is calcification in the
popliteal artery region. There is moderate osteoarthritic change in
the knee joint.
IMPRESSION: Moderate osteoarthritic change in the knee joint. No fracture or
dislocation. No no bony destruction or erosion. Overlying bandage
proximally. Popliteal artery atherosclerosis noted.
